# Patient Record
Sex: Male | Born: 1950
Health system: Southern US, Community
[De-identification: ages and names within clinical notes are randomized; demographics above are authoritative.]

## PROBLEM LIST (undated history)

## (undated) DIAGNOSIS — F319 Bipolar disorder, unspecified: Secondary | ICD-10-CM

## (undated) DIAGNOSIS — F411 Generalized anxiety disorder: Secondary | ICD-10-CM

## (undated) DIAGNOSIS — K219 Gastro-esophageal reflux disease without esophagitis: Secondary | ICD-10-CM

## (undated) DIAGNOSIS — M069 Rheumatoid arthritis, unspecified: Secondary | ICD-10-CM

## (undated) HISTORY — PX: HERNIA REPAIR: SHX51

## (undated) HISTORY — DX: Generalized anxiety disorder: F41.1

## (undated) HISTORY — PX: TOOTH EXTRACTION: SUR596

## (undated) HISTORY — DX: Bipolar disorder, unspecified: F31.9

## (undated) HISTORY — PX: CARPAL TUNNEL RELEASE: SHX101

## (undated) HISTORY — DX: Gastro-esophageal reflux disease without esophagitis: K21.9

## (undated) HISTORY — DX: Rheumatoid arthritis, unspecified: M06.9

## (undated) HISTORY — PX: OTHER SURGICAL HISTORY: SHX169

---

## 2001-08-30 ENCOUNTER — Encounter: Payer: Self-pay | Admitting: Emergency Medicine

## 2001-08-30 ENCOUNTER — Emergency Department (HOSPITAL_COMMUNITY): Admission: EM | Admit: 2001-08-30 | Discharge: 2001-08-30 | Payer: Self-pay | Admitting: Emergency Medicine

## 2003-09-29 ENCOUNTER — Emergency Department (HOSPITAL_COMMUNITY): Admission: EM | Admit: 2003-09-29 | Discharge: 2003-09-29 | Payer: Self-pay | Admitting: Emergency Medicine

## 2003-12-16 ENCOUNTER — Ambulatory Visit (HOSPITAL_COMMUNITY): Admission: RE | Admit: 2003-12-16 | Discharge: 2003-12-16 | Payer: Self-pay | Admitting: Gastroenterology

## 2005-02-02 ENCOUNTER — Encounter: Admission: RE | Admit: 2005-02-02 | Discharge: 2005-02-02 | Payer: Self-pay | Admitting: Internal Medicine

## 2005-04-16 ENCOUNTER — Ambulatory Visit: Payer: Self-pay | Admitting: Pulmonary Disease

## 2005-05-04 ENCOUNTER — Ambulatory Visit: Payer: Self-pay | Admitting: Internal Medicine

## 2005-05-10 ENCOUNTER — Ambulatory Visit: Payer: Self-pay | Admitting: Internal Medicine

## 2005-06-05 ENCOUNTER — Ambulatory Visit: Payer: Self-pay | Admitting: Internal Medicine

## 2005-10-24 ENCOUNTER — Ambulatory Visit: Payer: Self-pay | Admitting: Internal Medicine

## 2006-05-24 ENCOUNTER — Encounter: Admission: RE | Admit: 2006-05-24 | Discharge: 2006-05-24 | Payer: Self-pay | Admitting: Rheumatology

## 2006-11-20 ENCOUNTER — Ambulatory Visit: Payer: Self-pay | Admitting: Internal Medicine

## 2007-09-16 ENCOUNTER — Telehealth (INDEPENDENT_AMBULATORY_CARE_PROVIDER_SITE_OTHER): Payer: Self-pay | Admitting: *Deleted

## 2007-12-24 ENCOUNTER — Ambulatory Visit: Payer: Self-pay | Admitting: Internal Medicine

## 2007-12-24 DIAGNOSIS — J329 Chronic sinusitis, unspecified: Secondary | ICD-10-CM

## 2007-12-24 DIAGNOSIS — J3089 Other allergic rhinitis: Secondary | ICD-10-CM

## 2007-12-24 DIAGNOSIS — J330 Polyp of nasal cavity: Secondary | ICD-10-CM

## 2007-12-24 DIAGNOSIS — J45998 Other asthma: Secondary | ICD-10-CM

## 2007-12-24 DIAGNOSIS — J302 Other seasonal allergic rhinitis: Secondary | ICD-10-CM | POA: Insufficient documentation

## 2007-12-24 DIAGNOSIS — K219 Gastro-esophageal reflux disease without esophagitis: Secondary | ICD-10-CM | POA: Insufficient documentation

## 2007-12-24 HISTORY — DX: Gastro-esophageal reflux disease without esophagitis: K21.9

## 2007-12-24 HISTORY — DX: Chronic sinusitis, unspecified: J32.9

## 2007-12-24 HISTORY — DX: Other asthma: J45.998

## 2007-12-24 HISTORY — DX: Polyp of nasal cavity: J33.0

## 2007-12-24 HISTORY — DX: Other seasonal allergic rhinitis: J30.2

## 2009-08-18 ENCOUNTER — Ambulatory Visit (HOSPITAL_COMMUNITY): Admission: RE | Admit: 2009-08-18 | Discharge: 2009-08-18 | Payer: Self-pay | Admitting: Gastroenterology

## 2009-08-18 ENCOUNTER — Encounter (INDEPENDENT_AMBULATORY_CARE_PROVIDER_SITE_OTHER): Payer: Self-pay | Admitting: Gastroenterology

## 2010-03-13 ENCOUNTER — Telehealth: Payer: Self-pay | Admitting: Internal Medicine

## 2010-03-21 ENCOUNTER — Ambulatory Visit: Payer: Self-pay | Admitting: Internal Medicine

## 2010-04-11 ENCOUNTER — Telehealth (INDEPENDENT_AMBULATORY_CARE_PROVIDER_SITE_OTHER): Payer: Self-pay | Admitting: *Deleted

## 2010-04-11 ENCOUNTER — Ambulatory Visit: Payer: Self-pay | Admitting: Internal Medicine

## 2010-09-18 ENCOUNTER — Ambulatory Visit: Payer: Self-pay | Admitting: Internal Medicine

## 2010-09-18 DIAGNOSIS — M0609 Rheumatoid arthritis without rheumatoid factor, multiple sites: Secondary | ICD-10-CM

## 2010-09-18 HISTORY — DX: Rheumatoid arthritis without rheumatoid factor, multiple sites: M06.09

## 2010-10-29 ENCOUNTER — Encounter: Payer: Self-pay | Admitting: Internal Medicine

## 2010-11-07 NOTE — Assessment & Plan Note (Signed)
Summary: f/u ///kp   Primary Provider/Referring Provider:  Earl Gala  CC:  Follow up visit from ABX treatment.  History of Present Illness: 12/24/07- NASAL POLYP (ICD-471.0) G E R D (ICD-530.81) ASTHMA (ICD-493.90) ALLERGIC RHINITIS (ICD-477.9) Rheumatoid arthritis- methotrexate  Bronchitis and rhinosiusitis improved but didn't clear on augmentin before he caught another cold. Tells me he has been dx'd with rheumatoid arthritis by Dr.Shaili Deveshwar and is now no maintenance methotrexate.. We discussed how that will affect resistance and ability to clear infections Missed a day of work this week. Green sputum, no fever or chills. With methotrexate he no longer needs lufylline for his asthma.  March 21, 2010 Hx allergic rhinitis, asthma, hx recurrent sinusitis, Rheumatoid arthritis He responded very nicely to augmentin called in for a purulent sinusitis earlier this spring. Using rescue inhaler 2-7 days/ week. Triggers- hay being mowed. He wears dust mask and goggles to mow. Even steroid inhalers make him "mean". He can tolerate 1 puff rhinocort each nostril daily at bedtime. Singulair helps if needed. Likes Tussin otc.        Asthma History    Asthma Control Assessment:    Age range: 12+ years    Symptoms: 0-2 days/week    Nighttime Awakenings: 1-3/week    Interferes w/ normal activity: no limitations    SABA use (not for EIB): >2 days/week    Asthma Control Assessment: Not Well Controlled   Preventive Screening-Counseling & Management  Alcohol-Tobacco     Smoking Status: quit     Year Quit: 2009  Current Medications (verified): 1)  Methotrexate Injection .Marland Kitchen.. 1ml Once Weekly 2)  Rhinocort Aqua 32 Mcg/act  Susp (Budesonide) .... Take 1 Sprays in Each Nostril Once A Day 3)  Proventil Hfa 108 (90 Base) Mcg/act Aers (Albuterol Sulfate) .... 2 Puffs Four Times A Day As Needed 4)  Lithium .... Take 3 Tablets At Bedtime 5)  Augmentin 875-125 Mg  Tabs (Amoxicillin-Pot  Clavulanate) .Marland Kitchen.. 1 Two Times A Day 6)  Sm Tussin Cough & Cold 30-10-200 Mg Caps (Pseudoephedrine-Dm-Gg) .... Take As Directed As Needed 7)  Anti-Diarrheal 2 Mg Tabs (Loperamide Hcl) .... 3 By Mouth Once Daily  Allergies (verified): 1)  ! Steroids 2)  ! Aspirin 3)  Sulfa  Past History:  Past Medical History: Last updated: 12/24/2007 Allergic Rhinitis Asthma G E R D Rheumatoid arthritis  Past Surgical History: Last updated: 12/24/2007 Extensive sinus surgery with ablation of the frontal sinuses hernia repair nasal polypectomies  Social History: Last updated: 03/21/2010 Patient states former smoker.  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  The patient denies shortness of breath at rest, productive cough, non-productive cough, coughing up blood, chest pain, irregular heartbeats, acid heartburn, indigestion, loss of appetite, weight change, abdominal pain, difficulty swallowing, sore throat, tooth/dental problems, headaches, nasal congestion/difficulty breathing through nose, and sneezing.    Vital Signs:  Patient profile:   60 year old male Weight:      191 pounds O2 Sat:      96 % on Room air Pulse rate:   80 / minute BP sitting:   140 / 92  (right arm) Cuff size:  large  Vitals Entered By: Reynaldo Minium CMA (March 21, 2010 3:40 PM)  O2 Flow:  Room air CC: Follow up visit from ABX treatment   Physical Exam  Additional Exam:  General: A/Ox3; pleasant and cooperative, NAD, SKIN: no rash, lesions NODES: no lymphadenopathy HEENT: [place]/AT, EOM- WNL, Conjuctivae- clear, PERRLA, TM-WNL, Nose- clear, Throat- clear and wnl, Mallampati   II NECK: Supple w/ fair ROM, JVD- none, normal carotid impulses w/o bruits Thyroid- normal to palpation CHEST: Clear to P&A HEART: RRR, no m/g/r heard ABDOMEN: Soft and nl;  ZOX:WRUE, nl pulses, no edema  NEURO: Grossly intact to observation      Impression & Recommendations:  Problem # 1:  ASTHMA (ICD-493.90) Intermittent . I want him to have more access to a maintenance med - will encourage use of Singulair.  Problem # 2:  RHINOSINUSITIS, RECURRENT (ICD-473.9) Hx of surgery years ago with extensive obliteration of sinus cavities. He says that did help, but he still gets symptomatic.  Medications Added to Medication List This Visit: 1)  Proventil Hfa 108 (90 Base) Mcg/act Aers (Albuterol sulfate) .... 2 puffs four times a day as needed 2)  Sm Tussin Cough & Cold 30-10-200 Mg Caps (Pseudoephedrine-dm-gg) .... Take as directed as needed 3)  Anti-diarrheal 2 Mg Tabs (Loperamide hcl) .... 3 by mouth once daily 4)  Singulair 10 Mg Tabs (Montelukast sodium) .Marland Kitchen.. 1 daily  Other Orders: Est. Patient Level IV (45409)  Patient Instructions: 1)  Please schedule a follow-up appointment in 6 months. 2)  Script for  3)  singulair- quick to use and stay on this as a maintenance controller 4)  Schedule PFT Prescriptions: SINGULAIR 10 MG TABS (MONTELUKAST SODIUM) 1 daily  #30 x prn   Entered and Authorized by:   Waymon Budge MD   Signed by:   Waymon Budge MD on 03/21/2010   Method used:   Print then Give to Patient   RxID:   [id number]

## 2010-11-07 NOTE — Progress Notes (Signed)
Summary: rx request/ congestion  Phone Note Call from Patient   Caller: Patient Call For: Zenobia Kuennen Summary of Call: pt request a rx for augmentin. pt c/o head/ chest congestion x 1 1/2 weeks.  cough w/ yellow-green phlegm. denies fever. no N or V. has been using afrin. i offered an appt w/ tp but also scheduled a f/u w/ dr Jassica Zazueta for 6/14. pt doesn't want to come in before then. [facility].  pt # [id number] Initial call taken by: Tivis Ringer, CNA,  March 13, 2010 10:01 AM  Follow-up for Phone Call        Pt requesting augmentin for head congestion, prod cough with yellow-green mucus x 1 1/2 weeks.  Allergies:  ! STEROIDS ! ASPIRIN SULFA [facility].  Will forward message to CY-pls advise.  Thanks!  Follow-up by: Gweneth Dimitri RN,  March 13, 2010 10:27 AM  Additional Follow-up for Phone Call Additional follow up Details #1::        Ok augmentin 875 mg, # 14, ref x 1  1 two times a day  Additional Follow-up by: Waymon Budge MD,  March 13, 2010 1:35 PM    Additional Follow-up for Phone Call Additional follow up Details #2::    refill sent. pt aware.Carron Curie CMA  March 13, 2010 1:48 PM   New/Updated Medications: AUGMENTIN 875-125 MG  TABS (AMOXICILLIN-POT CLAVULANATE) 1 two times a day Prescriptions: AUGMENTIN 875-125 MG  TABS (AMOXICILLIN-POT CLAVULANATE) 1 two times a day  #14 x 1   Entered by:   Carron Curie CMA   Authorized by:   Waymon Budge MD   Signed by:   Carron Curie CMA on 03/13/2010   Method used:   Electronically to        [facility]* (retail)       [address] [place], [place]  [address]       Ph: [phone] or [phone]       Fax: [phone]   RxID:   [id number]

## 2010-11-07 NOTE — Miscellaneous (Signed)
Summary: Orders Update pft charges  Clinical Lists Changes  Orders: Added new Service order of Carbon Monoxide diffusing w/capacity (94720) - Signed Added new Service order of Lung Volumes (94240) - Signed Added new Service order of Spirometry (Pre & Post) (94060) - Signed 

## 2010-11-07 NOTE — Progress Notes (Signed)
Summary: Singulair correct dosage  Phone Note Call from Patient   Caller: Patient Call For: young Summary of Call: Pt rx for singulair states two times a day but he is given 30 tabs.  Is he taking this once daily or two times a day ? Initial call taken by: Jerolyn Shin,  April 11, 2010 5:13 PM  Follow-up for Phone Call        Should be once daily.  LMOMTCB Vernie Murders  April 11, 2010 5:18 PM  Pt aware Singulair is taken once daily and at bedtime. Pt had verbalized understanding of this.Michel Bickers CMA  April 13, 2010 10:02 AM

## 2010-11-09 NOTE — Assessment & Plan Note (Signed)
Summary: 6 months/apc   Primary Provider/Referring Provider:  Earl Gala  CC:  6 month follow up visit-asthma and allergies..  History of Present Illness: Bronchitis and rhinosiusitis improved but didn't clear on augmentin before he caught another cold. Tells me he has been dx'd with rheumatoid arthritis by Dr.Shaili Deveshwar and is now no maintenance methotrexate.. We discussed how that will affect resistance and ability to clear infections Missed a day of work this week. Green sputum, no fever or chills. With methotrexate he no longer needs lufylline for his asthma.  March 21, 2010 Hx allergic rhinitis, asthma, hx recurrent sinusitis, Rheumatoid arthritis He responded very nicely to augmentin called in for a purulent sinusitis earlier this spring. Using rescue inhaler 2-7 days/ week. Triggers- hay being mowed. He wears dust mask and goggles to mow. Even steroid inhalers make him "mean". He can tolerate 1 puff rhinocort each nostril daily at bedtime. Singulair helps if needed. Likes Tussin otc.  September 18, 2010- Hx allergic rhinitis, asthma, hx recurrent sinusitis, Rheumatoid arthritis Nurse-CC: 6 month follow up visit-asthma and allergies. Now on Humiria plus methotrexate for RA with good pain control. Stopped ETOH but still aware of some LFT elevation, followed by Dr Corliss Skains. Has not needed to see Dr Lazarus Salines recently but did get called in antibiotics a couple of times. Feels well from asthma standpoint. Singulair is a help and regular exercise also helps. Some cough and phlegm, but less. Using rescue inhaler twice daily.  PFT- 04/11/10- FVC 5.21/ 122%, FEV1 3.51/ 114%, R 0.67, + resp to BD, TLC 130%, DLCO 97%.      Asthma History    Initial Asthma Severity Rating:    Age range: 12+ years    Symptoms: 0-2 days/week    Nighttime Awakenings: 0-2/month    Interferes w/ normal activity: no limitations    SABA use (not for EIB): 0-2 days/week    Asthma Severity Assessment:  Intermittent   Preventive Screening-Counseling & Management  Alcohol-Tobacco     Smoking Status: quit     Year Quit: 2009  Current Medications (verified): 1)  Methotrexate Sodium 25 Mg/ml Soln (Methotrexate Sodium) .Marland Kitchen.. 1ml Once Weekly 2)  Rhinocort Aqua 32 Mcg/act  Susp (Budesonide) .... Take 1 Sprays in Each Nostril Once A Day 3)  Proventil Hfa 108 (90 Base) Mcg/act Aers (Albuterol Sulfate) .... 2 Puffs Four Times A Day As Needed 4)  Lithium Carbonate 300 Mg Caps (Lithium Carbonate) .... Take 3 By Mouth At Bedtime 5)  Sm Tussin Cough & Cold 30-10-200 Mg Caps (Pseudoephedrine-Dm-Gg) .... Take As Directed As Needed 6)  Anti-Diarrheal 2 Mg Tabs (Loperamide Hcl) .... 3 By Mouth Once Daily 7)  Singulair 10 Mg Tabs (Montelukast Sodium) .... Take 1 By Mouth Two Times A Day 8)  Viagra 100 Mg Tabs (Sildenafil Citrate) .... Use As Directed As Needed 9)  Folic Acid 1 Mg Tabs (Folic Acid) .... Take 2 By Mouth Once Daily 10)  Clonazepam 0.5 Mg Tabs (Clonazepam) .... Take 1 By Mouth Three Times A Day 11)  Nortriptyline Hcl 10 Mg Caps (Nortriptyline Hcl) .... Take 3 By Mouth At Bedtime 12)  Nasal Spray 0.05 % Soln (Oxymetazoline Hcl) .... As Needed 13)  Glucosamine-Chondroitin-Vit D3 1500-1200-800 Mg-Mg-Unit Pack (Glucosamine-Chondroitin-Vit D3) .... Take 2 By Mouth Once Daily 14)  Fish Oil 1200 Mg Caps (Omega-3 Fatty Acids) .... Take 1 By Mouth Once Daily  Allergies (verified): 1)  ! Steroids 2)  ! Aspirin 3)  Sulfa  Past History:  Past Medical History: Last  updated: 12/24/2007 Allergic Rhinitis Asthma G E R D Rheumatoid arthritis  Past Surgical History: Last updated: 12/24/2007 Extensive sinus surgery with ablation of the frontal sinuses hernia repair nasal polypectomies  Family History: Last updated: 03-25-10 Father- died MS Mother- died breast cancer  Social History: Last updated: 25-Mar-2010 Patient states former smoker.  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    Vital Signs:  Patient profile:   60 year old male Weight:      193.25 pounds O2 Sat:      96 % on Room air Pulse rate:   75 / minute BP sitting:   120 / 80  (left arm) Cuff size:   regular  Vitals Entered By: Reynaldo Minium CMA (September 18, 2010 4:00 PM)  O2 Flow:  Room air CC: 6 month follow up visit-asthma and allergies.   Physical Exam  Additional Exam:  General: A/Ox3; pleasant and cooperative, NAD, SKIN: no rash, lesions NODES: no lymphadenopathy HEENT: [place]/AT, EOM- WNL, Conjuctivae- clear, PERRLA, TM-WNL, Nose- clear, Throat- clear and wnl, Mallampati  II NECK: Supple w/ fair ROM, JVD- none, normal carotid impulses w/o bruits Thyroid- normal to palpation CHEST: Clear to P&A HEART: RRR, no m/g/r heard ABDOMEN: Soft and nl;  ZOX:WRUE, nl pulses, no edema  NEURO: Grossly intact to observation      Impression & Recommendations:  Problem # 1:  ASTHMA (ICD-493.90) Good control. Because of his methotrexate I will check his CXR for baseline.  PFTs measure large lungs. This could be error, but will accept for now as larger than average lungs for height.   Problem # 2:  ALLERGIC RHINITIS (ICD-477.9)  Good control with occasional sinusitis.  His updated medication list for this problem includes:    Rhinocort Aqua 32 Mcg/act Susp (Budesonide) .Marland Kitchen... Take 1 sprays in each nostril once a day    Nasal Spray 0.05 % Soln (Oxymetazoline hcl) .Marland Kitchen... As needed  Medications Added to Medication List  This Visit: 1)  Humira 40 Mg/0.51ml Kit (Adalimumab) 2)  Methotrexate Sodium 25 Mg/ml Soln (Methotrexate sodium) .Marland Kitchen.. 1ml once weekly 3)  Lithium Carbonate 300 Mg Caps (Lithium carbonate) .... Take 3 by mouth at bedtime 4)  Singulair 10 Mg Tabs (Montelukast sodium) .... Take 1 by mouth two times a day 5)  Viagra 100 Mg Tabs (Sildenafil citrate) .... Use as directed as needed 6)  Folic Acid 1 Mg Tabs (Folic acid) .... Take 2 by mouth once daily 7)  Clonazepam 0.5 Mg Tabs (Clonazepam) .... Take 1 by mouth three times a day 8)  Nortriptyline Hcl 10 Mg Caps (Nortriptyline hcl) .... Take 3 by mouth at bedtime 9)  Nasal Spray 0.05 % Soln (Oxymetazoline hcl) .... As needed 10)  Glucosamine-chondroitin-vit D3 1500-1200-800 Mg-mg-unit Pack (Glucosamine-chondroitin-vit d3) .... Take 2 by mouth once daily 11)  Fish Oil 1200 Mg Caps (Omega-3 fatty acids) .... Take 1 by mouth once daily  Other Orders: Est. Patient Level III (45409) T-2 View CXR (71020TC)  Patient Instructions: 1)  Please schedule a follow-up appointment in 1 year. 2)  A chest x-ray has been recommended.  Your imaging study may require preauthorization.

## 2011-02-23 NOTE — Op Note (Signed)
NAME:  [name] [name], [name] [name]                        ACCOUNT NO.:  [id number]   MEDICAL RECORD NO.:  [id number]                   PATIENT TYPE:  AMB   LOCATION:  ENDO                                 FACILITY:  [facility]   PHYSICIAN:  Danise Edge, M.D.                DATE OF BIRTH:  1950/12/28   DATE OF PROCEDURE:  12/16/2003  DATE OF DISCHARGE:                                 OPERATIVE REPORT   PROCEDURE:  Screening colonoscopy.   INDICATIONS:  [name] is a 60 year old male, born August 16, 1951.  [name] is scheduled to undergo his first screening colonoscopy  with polypectomy to prevent colon cancer.   ENDOSCOPIST:  Danise Edge, M.D.   PREMEDICATION:  Versed 10 mg, Demerol 70 mg.   DESCRIPTION OF PROCEDURE:  After obtaining informed consent, [name] was  placed in the left lateral decubitus position.  I administered intravenous  Demerol and intravenous Versed to achieve conscious sedation for the  procedure.  The patient's blood pressure, oxygen saturation and cardiac  rhythm were monitored throughout the procedure and documented in the medical  record.   Anal inspection was normal.  Digital rectal exam revealed a small nonnodular  prostate.  The Olympus adjustable pediatric colonoscope was introduced into  the rectum and advanced to the cecum.  Colonic preparation for the exam  today was excellent.  Rectum:  Normal.  Sigmoid colon and descending colon:  Normal.  Splenic flexure:  Normal.  Transverse colon:  Normal.  Hepatic flexure:  Normal.  Ascending colon:  Normal.  Cecum and ileocecal valve:  Normal.   ASSESSMENT:  Normal proctocolonoscopy to the cecum.  No endoscopic evidence  for the presence of colorectal neoplasia.                                               Danise Edge, M.D.    MJ/MEDQ  D:  12/16/2003  T:  12/16/2003  Job:  [id number]   cc:   Theressa Millard, M.D.  [address] [place]  [place] [address]  Fax: [phone]

## 2011-02-23 NOTE — Assessment & Plan Note (Signed)
[name]                             PULMONARY OFFICE NOTE   [name], [name] [name]                     MRN:          [id number]  DATE:11/20/2006                            DOB:          [name] [name] 02, 1952    HISTORY OF PRESENT ILLNESS:  Patient is a 60 year old white male,  patient of Dr. Maple Hudson, who has a known history of allergic rhinitis,  asthma, nasal polyps/asthma triad who presents for an acute office  visit.  Patient complains of a 2-day history of nasal congestion,  productive cough with thick whitish/brown sputum.  Patient denies any  hemoptysis, orthopnea, PND or leg swelling.   PAST MEDICAL HISTORY:  Reviewed.   CURRENT MEDICATIONS:  Reviewed.   PHYSICAL EXAMINATION:  Patient is a pleasant male in no acute distress.  He is afebrile with stable vital signs.  His 02 saturation is 96% on  room air.  HEENT:  Nasal mucosa with mild erythema.  Non-tender sinuses.  NECK:  Supple without adenopathy.  LUNG SOUNDS:  Clear to auscultation bilaterally.  CARDIAC:  Regular rate.  ABDOMEN:  Soft and non-tender.  EXTREMITIES:  Warm without any edema.   IMPRESSION AND PLAN:  Acute upper respiratory infection.  Suspect this  is viral in nature.  Patient is to begin Mucinex DM twice daily.  [name] [name]  use Tussionex 4 ounces, 1 teaspoon every 12 hours as needed for cough.  Xopenex nebulizer treatment was given in the office.  Patient is to  return back with Dr. Maple Hudson as scheduled, or sooner if needed.      Rubye Oaks, NP  Electronically Signed      Clinton D. Maple Hudson, MD, Tonny Bollman, FACP  Electronically Signed   TP/MedQ  DD: 11/20/2006  DT: 11/20/2006  Job #: [id number]

## 2011-08-13 ENCOUNTER — Other Ambulatory Visit: Payer: Self-pay | Admitting: Internal Medicine

## 2011-08-14 ENCOUNTER — Telehealth: Payer: Self-pay | Admitting: Internal Medicine

## 2011-08-14 NOTE — Telephone Encounter (Signed)
I spoke with pt and he states he went to urgent care on Friday bc he was having an asthma attack and felt he had a cold in his chest and head x Friday. Pt states they gave him clarithromycin 500 mg 1 BID, promethazine cough syrup 5 cc 4 times a day prn, fexofenadine 180 daily. Pt states he wants to make sure Dr. Maple Hudson thinks he is on the right medication. Pt states he is feeling some better. Pt is still experiencing cough w/ yellow phlem and streaks of blodd in it, wheezing, chest tightness, nasal drainage. Please advise Dr. Maple Hudson, thanks  Allergies  Allergen Reactions  . Aspirin     REACTION: throat swelling  . Sulfonamide Derivatives     REACTION: oral sores     Carver Fila, CMA

## 2011-08-14 NOTE — Telephone Encounter (Signed)
These are very reasonable meds and will need time to work. If he doesn't get better, we can call in something else.

## 2011-08-14 NOTE — Telephone Encounter (Signed)
I spoke with pt and made him aware of cdy recs. Pt verbalized understanding and states he will call back if no better by friday

## 2011-09-17 ENCOUNTER — Encounter: Payer: Self-pay | Admitting: Internal Medicine

## 2011-09-18 ENCOUNTER — Ambulatory Visit (INDEPENDENT_AMBULATORY_CARE_PROVIDER_SITE_OTHER): Payer: 59 | Admitting: Internal Medicine

## 2011-09-18 ENCOUNTER — Encounter: Payer: Self-pay | Admitting: Internal Medicine

## 2011-09-18 VITALS — BP 140/82 | HR 63 | Ht 68.0 in | Wt 196.6 lb

## 2011-09-18 DIAGNOSIS — J45909 Unspecified asthma, uncomplicated: Secondary | ICD-10-CM

## 2011-09-18 DIAGNOSIS — J329 Chronic sinusitis, unspecified: Secondary | ICD-10-CM

## 2011-09-18 MED ORDER — BECLOMETHASONE DIPROPIONATE 40 MCG/ACT IN AERS: 2.0000 | INHALATION_SPRAY | Freq: Two times a day (BID) | RESPIRATORY_TRACT | Status: DC

## 2011-09-18 NOTE — Patient Instructions (Signed)
Sample and script Qvar 40 steroid maintenance inhaler   2 puffs then rinse mouth, twice daily

## 2011-09-18 NOTE — Progress Notes (Signed)
09/18/11- 60 yoM former smoker followed for asthma, allergic rhinitis, hx rhinosinusitis, nasal polyps complicated by rheumatoid arthritis. LOV-09/18/2010 Has had flu vaccine. Going through marital separation. Had  caught a cold with asthma exacerbation treated at urgent care with Biaxin and cough syrup. Symptoms resolving after steroid injection. He has been needing his rescue inhaler twice daily. On Humira for RA/ Dr Corliss Skains.  ROS-see HpI Constitutional:   No-   weight loss, night sweats, fevers, chills, fatigue, lassitude. HEENT:   No-  headaches, difficulty swallowing, tooth/dental problems, sore throat,       No-  sneezing, itching, ear ache, +nasal congestion, post nasal drip,  CV:  No-   chest pain, orthopnea, PND, swelling in lower extremities, anasarca,                                  dizziness, palpitations Resp: No-   shortness of breath with exertion or at rest.              No-   productive cough,  + non-productive cough,  No- coughing up of blood.              No-   change in color of mucus.  + wheezing.   Skin: No-   rash or lesions. GI:  No-   heartburn, indigestion, abdominal pain, nausea, vomiting, diarrhea,                 change in bowel habits, loss of appetite GU: No-   dysuria, change in color of urine, no urgency or frequency.  No- flank pain. MS: +  joint pain or swelling.  No- decreased range of motion.  No- back pain. Neuro-     nothing unusual Psych:  No- change in mood or affect. No depression or anxiety.  No memory loss.  OBJ General- Alert, Oriented, Affect-appropriate, Distress- none acute Skin- rash-none, lesions- none, excoriation- none Lymphadenopathy- none Head- atraumatic            Eyes- Gross vision intact, PERRLA, conjunctivae clear secretions            Ears- Hearing, canals-normal            Nose- Clear, no-Septal dev, mucus, polyps, erosion, perforation             Throat- Mallampati II , mucosa clear , drainage- none, tonsils-  atrophic Neck- flexible , trachea midline, no stridor , thyroid nl, carotid no bruit Chest - symmetrical excursion , unlabored           Heart/CV- RRR , no murmur , no gallop  , no rub, nl s1 s2                           - JVD- none , edema- none, stasis changes- none, varices- none           Lung- clear to P&A, wheeze- none, raspy cough , dullness-none, rub- none           Chest wall-  Abd- tender-no, distended-no, bowel sounds-present, HSM- no Br/ Gen/ Rectal- Not done, not indicated Extrem- cyanosis- none, clubbing, none, atrophy- none, strength- nl. Synovial thickening of hand joints. Neuro- grossly intact to observation

## 2011-09-22 NOTE — Assessment & Plan Note (Signed)
Recent viral URI does not seem to be headed to a sinus infection at this time. He was treated at an urgent care with clarithromycin.

## 2011-09-22 NOTE — Assessment & Plan Note (Signed)
Mild intermittent asthma with recent viral triggered exacerbation which he is controlling fairly well. I would like to add a maintenance steroid inhaler as discussed.

## 2011-10-16 ENCOUNTER — Other Ambulatory Visit: Payer: Self-pay | Admitting: Internal Medicine

## 2011-10-16 MED ORDER — MONTELUKAST SODIUM 10 MG PO TABS: 10.0000 mg | ORAL_TABLET | Freq: Two times a day (BID) | ORAL | Status: DC

## 2011-10-16 NOTE — Telephone Encounter (Signed)
RX sent

## 2011-10-16 NOTE — Telephone Encounter (Signed)
Singulair prescription written for twice daily dosing. CDY please advise if these directions are correct. Last filled on 08/31/11.

## 2011-10-16 NOTE — Telephone Encounter (Signed)
Per CY-okay to refill as requested. 

## 2011-10-18 ENCOUNTER — Telehealth: Payer: Self-pay | Admitting: Internal Medicine

## 2011-10-18 NOTE — Telephone Encounter (Signed)
I spoke with pt and made him aware rx was sent on 10/16/11. He voiced his understanding and will check with his pharmacy. Pt needed nothing further

## 2011-10-23 ENCOUNTER — Other Ambulatory Visit: Payer: Self-pay | Admitting: Internal Medicine

## 2011-11-06 ENCOUNTER — Other Ambulatory Visit: Payer: Self-pay | Admitting: Endocrinology

## 2011-11-06 DIAGNOSIS — E291 Testicular hypofunction: Secondary | ICD-10-CM

## 2011-11-11 ENCOUNTER — Ambulatory Visit
Admission: RE | Admit: 2011-11-11 | Discharge: 2011-11-11 | Disposition: A | Discharge status: Home / Self Care | Payer: 59 | Source: Ambulatory Visit | Attending: Endocrinology | Admitting: Endocrinology

## 2011-11-11 DIAGNOSIS — E291 Testicular hypofunction: Secondary | ICD-10-CM

## 2011-11-11 MED ORDER — GADOBENATE DIMEGLUMINE 529 MG/ML IV SOLN
9.0000 mL | Freq: Once | INTRAVENOUS | Status: AC | PRN
  Administered 2011-11-11: 9 mL via INTRAVENOUS

## 2012-04-15 DIAGNOSIS — E236 Other disorders of pituitary gland: Secondary | ICD-10-CM | POA: Diagnosis not present

## 2012-04-22 DIAGNOSIS — Z79899 Other long term (current) drug therapy: Secondary | ICD-10-CM | POA: Diagnosis not present

## 2012-04-22 DIAGNOSIS — Z111 Encounter for screening for respiratory tuberculosis: Secondary | ICD-10-CM | POA: Diagnosis not present

## 2012-04-22 DIAGNOSIS — F3162 Bipolar disorder, current episode mixed, moderate: Secondary | ICD-10-CM | POA: Diagnosis not present

## 2012-05-12 ENCOUNTER — Telehealth: Payer: Self-pay | Admitting: Internal Medicine

## 2012-05-12 MED ORDER — AMOXICILLIN-POT CLAVULANATE 875-125 MG PO TABS: 1.0000 | ORAL_TABLET | Freq: Two times a day (BID) | ORAL | Status: AC

## 2012-05-12 NOTE — Telephone Encounter (Signed)
Per CY-okay to give Augmentin 875 mg #14 take 1 po bid with 1 additional refill.

## 2012-05-12 NOTE — Telephone Encounter (Signed)
I spoke with pt and is aware of CDY recs. Rx has been sent to the pharmacy 

## 2012-05-12 NOTE — Telephone Encounter (Signed)
I spoke with pt and he c/o chest congestion, cough w/ yellow-green phlem, blowing out yellow-green phlem, increase sob, pnd X 1 1/2 WEEKS. Denies any f/c/s/n/v. Pt is requesting an ABX be called in. Please advise Dr. Maple Hudson thanks  Allergies  Allergen Reactions  . Aspirin     REACTION: throat swelling  . Sulfonamide Derivatives     REACTION: oral sores     [facility] [place]

## 2012-05-16 ENCOUNTER — Other Ambulatory Visit: Payer: Self-pay | Admitting: Internal Medicine

## 2012-05-29 DIAGNOSIS — R6882 Decreased libido: Secondary | ICD-10-CM | POA: Diagnosis not present

## 2012-05-29 DIAGNOSIS — E236 Other disorders of pituitary gland: Secondary | ICD-10-CM | POA: Diagnosis not present

## 2012-05-29 DIAGNOSIS — R5381 Other malaise: Secondary | ICD-10-CM | POA: Diagnosis not present

## 2012-05-29 DIAGNOSIS — N529 Male erectile dysfunction, unspecified: Secondary | ICD-10-CM | POA: Diagnosis not present

## 2012-07-01 DIAGNOSIS — M25549 Pain in joints of unspecified hand: Secondary | ICD-10-CM | POA: Diagnosis not present

## 2012-07-07 DIAGNOSIS — Z79899 Other long term (current) drug therapy: Secondary | ICD-10-CM | POA: Diagnosis not present

## 2012-08-05 DIAGNOSIS — L708 Other acne: Secondary | ICD-10-CM | POA: Diagnosis not present

## 2012-08-12 ENCOUNTER — Other Ambulatory Visit: Payer: Self-pay | Admitting: Internal Medicine

## 2012-08-21 DIAGNOSIS — M25579 Pain in unspecified ankle and joints of unspecified foot: Secondary | ICD-10-CM | POA: Diagnosis not present

## 2012-08-21 DIAGNOSIS — Z79899 Other long term (current) drug therapy: Secondary | ICD-10-CM | POA: Diagnosis not present

## 2012-08-21 DIAGNOSIS — M25549 Pain in joints of unspecified hand: Secondary | ICD-10-CM | POA: Diagnosis not present

## 2012-08-21 DIAGNOSIS — M069 Rheumatoid arthritis, unspecified: Secondary | ICD-10-CM | POA: Diagnosis not present

## 2012-09-22 DIAGNOSIS — E236 Other disorders of pituitary gland: Secondary | ICD-10-CM | POA: Diagnosis not present

## 2012-09-24 DIAGNOSIS — N529 Male erectile dysfunction, unspecified: Secondary | ICD-10-CM | POA: Diagnosis not present

## 2012-09-24 DIAGNOSIS — R5381 Other malaise: Secondary | ICD-10-CM | POA: Diagnosis not present

## 2012-09-24 DIAGNOSIS — R5383 Other fatigue: Secondary | ICD-10-CM | POA: Diagnosis not present

## 2012-09-24 DIAGNOSIS — E236 Other disorders of pituitary gland: Secondary | ICD-10-CM | POA: Diagnosis not present

## 2012-09-24 DIAGNOSIS — R6882 Decreased libido: Secondary | ICD-10-CM | POA: Diagnosis not present

## 2012-10-03 ENCOUNTER — Other Ambulatory Visit: Payer: Self-pay | Admitting: Internal Medicine

## 2012-10-03 MED ORDER — ALBUTEROL SULFATE HFA 108 (90 BASE) MCG/ACT IN AERS: INHALATION_SPRAY | RESPIRATORY_TRACT | Status: DC

## 2012-10-03 NOTE — Telephone Encounter (Signed)
Last fill 07-10-12

## 2012-10-09 ENCOUNTER — Telehealth: Payer: Self-pay | Admitting: Internal Medicine

## 2012-10-09 MED ORDER — BECLOMETHASONE DIPROPIONATE 40 MCG/ACT IN AERS: 2.0000 | INHALATION_SPRAY | Freq: Two times a day (BID) | RESPIRATORY_TRACT | Status: DC

## 2012-10-09 NOTE — Telephone Encounter (Signed)
Pt last seen 09-2011 so I advised we will need to schedule an appt before refill can be sent. Appt set for 10-21-12 and refill sent.Carron Curie, CMA

## 2012-10-21 ENCOUNTER — Encounter: Payer: Self-pay | Admitting: Internal Medicine

## 2012-10-21 ENCOUNTER — Ambulatory Visit (INDEPENDENT_AMBULATORY_CARE_PROVIDER_SITE_OTHER)
Admission: RE | Admit: 2012-10-21 | Discharge: 2012-10-21 | Disposition: A | Discharge status: Home / Self Care | Payer: Medicare Other | Source: Ambulatory Visit | Attending: Internal Medicine | Admitting: Internal Medicine

## 2012-10-21 ENCOUNTER — Ambulatory Visit (INDEPENDENT_AMBULATORY_CARE_PROVIDER_SITE_OTHER): Payer: Medicare Other | Admitting: Internal Medicine

## 2012-10-21 VITALS — BP 132/66 | HR 69 | Ht 68.0 in | Wt 193.2 lb

## 2012-10-21 DIAGNOSIS — J45909 Unspecified asthma, uncomplicated: Secondary | ICD-10-CM | POA: Diagnosis not present

## 2012-10-21 DIAGNOSIS — Z87891 Personal history of nicotine dependence: Secondary | ICD-10-CM | POA: Diagnosis not present

## 2012-10-21 DIAGNOSIS — M069 Rheumatoid arthritis, unspecified: Secondary | ICD-10-CM

## 2012-10-21 DIAGNOSIS — J330 Polyp of nasal cavity: Secondary | ICD-10-CM

## 2012-10-21 DIAGNOSIS — J45998 Other asthma: Secondary | ICD-10-CM

## 2012-10-21 MED ORDER — ALBUTEROL SULFATE HFA 108 (90 BASE) MCG/ACT IN AERS: INHALATION_SPRAY | RESPIRATORY_TRACT | Status: DC

## 2012-10-21 MED ORDER — BECLOMETHASONE DIPROPIONATE 40 MCG/ACT IN AERS: 2.0000 | INHALATION_SPRAY | Freq: Two times a day (BID) | RESPIRATORY_TRACT | Status: DC

## 2012-10-21 NOTE — Patient Instructions (Addendum)
Order- Order- CXR   Dx asthma, former smoker  Scripts sent for Qvar and for albuterol rescue inhaler

## 2012-10-21 NOTE — Progress Notes (Signed)
09/18/11- 60 yoM former smoker followed for asthma, allergic rhinitis, hx rhinosinusitis, nasal polyps complicated by rheumatoid arthritis. LOV-09/18/2010 Has had flu vaccine. Going through marital separation. Had  caught a cold with asthma exacerbation treated at urgent care with Biaxin and cough syrup. Symptoms resolving after steroid injection. He has been needing his rescue inhaler twice daily. On Humira for RA/ Dr Corliss Skains.  1[birth year]14-61 yoM former smoker followed for asthma, allergic rhinitis, hx rhinosinusitis, nasal polyps complicated by rheumatoid arthritis. FOLLOWS FOR: was doing good until he ran out of Qvar(had to make appt for refill) uses rescue inhaler as well.  Ran out of both inhalers 2 weeks ago and began coughing more. Otherwise had been well controlled. On Humira and MTX for rheumatoid arthritis. CXR 09/18/10 IMPRESSION:  No active lung disease.  Provider: Darrelyn Hillock, [place]   ROS-see HpI Constitutional:   No-   weight loss, night sweats, fevers, chills, fatigue, lassitude. HEENT:   No-  headaches, difficulty swallowing, tooth/dental problems, sore throat,       No-  sneezing, itching, ear ache, nasal congestion, post nasal drip,  CV:  No-   chest pain, orthopnea, PND, swelling in lower extremities, anasarca,                                  dizziness, palpitations Resp: No-   shortness of breath with exertion or at rest.              No-   productive cough,  + non-productive cough,  No- coughing up of blood.              No-   change in color of mucus.  + Some wheezing.   Skin: No-   rash or lesions. GI:  No-   heartburn, indigestion, abdominal pain, nausea, vomiting, GU:  MS: +  joint pain or swelling.   Neuro-     nothing unusual Psych:  No- change in mood or affect. No depression or anxiety.  No memory loss.  OBJ General- Alert, Oriented, Affect-appropriate, Distress- none acute Skin- rash-none, lesions- none, excoriation- none Lymphadenopathy-  none Head- atraumatic            Eyes- Gross vision intact, PERRLA, conjunctivae clear secretions            Ears- Hearing, canals-normal            Nose- Clear, no-Septal dev, mucus, polyps, erosion, perforation             Throat- Mallampati II , mucosa clear , drainage- none, tonsils- atrophic Neck- flexible , trachea midline, no stridor , thyroid nl, carotid no bruit Chest - symmetrical excursion , unlabored           Heart/CV- RRR , no murmur , no gallop  , no rub, nl s1 s2                           - JVD- none , edema- none, stasis changes- none, varices- none           Lung- clear to P&A, wheeze- none, no- cough , dullness-none, rub- none           Chest wall-  Abd-  Br/ Gen/ Rectal- Not done, not indicated Extrem- cyanosis- none, clubbing, none, atrophy- none, strength- nl. +Synovial thickening of hand joints. Neuro- grossly intact to observation

## 2012-10-22 ENCOUNTER — Telehealth: Payer: Self-pay | Admitting: Internal Medicine

## 2012-10-22 NOTE — Progress Notes (Signed)
Quick Note:  LMTCB ______ 

## 2012-10-22 NOTE — Telephone Encounter (Signed)
Result Note     CXR- clear lungs, no active process   --- I spoke with patient about results and he verbalized understanding and had no questions.

## 2012-10-28 ENCOUNTER — Other Ambulatory Visit: Payer: Self-pay | Admitting: Internal Medicine

## 2012-10-28 MED ORDER — MONTELUKAST SODIUM 10 MG PO TABS: ORAL_TABLET | ORAL | Status: DC

## 2012-11-01 NOTE — Assessment & Plan Note (Signed)
Generally well controlled with increased symptoms only because he has run out of maintenance medications. He had been a smoker. Plan-refill meds, update chest x-ray

## 2012-11-01 NOTE — Assessment & Plan Note (Signed)
No polyps seen on exam today. His rheumatoid medications may suppress them.

## 2012-11-01 NOTE — Assessment & Plan Note (Signed)
On immunosuppressive therapy

## 2012-11-05 ENCOUNTER — Telehealth: Payer: Self-pay | Admitting: Internal Medicine

## 2012-11-05 MED ORDER — AMOXICILLIN 500 MG PO CAPS: 500.0000 mg | ORAL_CAPSULE | Freq: Three times a day (TID) | ORAL | Status: DC

## 2012-11-05 NOTE — Telephone Encounter (Signed)
Forwarding to CY.

## 2012-11-05 NOTE — Telephone Encounter (Signed)
Spoke with patient-states for 3-4 days now he is having raspy voice, SOB, and cough-productive-green in color; he is using inhalers but request Amoxicillin Rx called to pharmacy as this helps when he gets like this. Pt aware that CY out of the office this afternoon and will forward to [facility] as doc of day. Please advise.

## 2012-11-05 NOTE — Telephone Encounter (Signed)
Ok to Rx amoxacillin 500 mg, # 21, 1 tid  No ref

## 2012-11-05 NOTE — Telephone Encounter (Signed)
Pt aware of CDY recs. He voiced his understanding and needed nothing further was needed

## 2012-11-11 ENCOUNTER — Telehealth: Payer: Self-pay | Admitting: Internal Medicine

## 2012-11-11 MED ORDER — AMOXICILLIN 500 MG PO CAPS: 500.0000 mg | ORAL_CAPSULE | Freq: Three times a day (TID) | ORAL | Status: DC

## 2012-11-11 NOTE — Telephone Encounter (Signed)
Ok to refill the amoxacillin

## 2012-11-11 NOTE — Telephone Encounter (Signed)
I spoke with pt. C/o cough w/ green to brown phlem, HA, raspy voice is getting better, SOB is better. No wheezing, no chest tx. He has 2 doses of amoxicillin left. He is requesting refill on this bc he does not believe he is cleared up. Please advise Dr. Maple Hudson thanks Last OV 10/21/12 No pending APPT  Allergies  Allergen Reactions  . Aspirin     REACTION: throat swelling  . Sulfonamide Derivatives     REACTION: oral sores

## 2012-11-11 NOTE — Telephone Encounter (Signed)
Called spoke with patient Advised of CY's recs to refill the amoxicillin Refill done as last prescribed Pt is aware Rx sent to verified pharmacy Pt aware to call back if symptoms do not improve or worsen Will sign off

## 2012-12-04 DIAGNOSIS — Z79899 Other long term (current) drug therapy: Secondary | ICD-10-CM | POA: Diagnosis not present

## 2013-01-13 DIAGNOSIS — R5383 Other fatigue: Secondary | ICD-10-CM | POA: Diagnosis not present

## 2013-01-13 DIAGNOSIS — M069 Rheumatoid arthritis, unspecified: Secondary | ICD-10-CM | POA: Diagnosis not present

## 2013-01-13 DIAGNOSIS — T451X5A Adverse effect of antineoplastic and immunosuppressive drugs, initial encounter: Secondary | ICD-10-CM | POA: Diagnosis not present

## 2013-01-13 DIAGNOSIS — Z79899 Other long term (current) drug therapy: Secondary | ICD-10-CM | POA: Diagnosis not present

## 2013-01-13 DIAGNOSIS — M19049 Primary osteoarthritis, unspecified hand: Secondary | ICD-10-CM | POA: Diagnosis not present

## 2013-01-13 DIAGNOSIS — R5381 Other malaise: Secondary | ICD-10-CM | POA: Diagnosis not present

## 2013-01-26 DIAGNOSIS — E236 Other disorders of pituitary gland: Secondary | ICD-10-CM | POA: Diagnosis not present

## 2013-01-26 DIAGNOSIS — R5381 Other malaise: Secondary | ICD-10-CM | POA: Diagnosis not present

## 2013-02-02 DIAGNOSIS — R5381 Other malaise: Secondary | ICD-10-CM | POA: Diagnosis not present

## 2013-02-02 DIAGNOSIS — E236 Other disorders of pituitary gland: Secondary | ICD-10-CM | POA: Diagnosis not present

## 2013-02-02 DIAGNOSIS — R6882 Decreased libido: Secondary | ICD-10-CM | POA: Diagnosis not present

## 2013-02-02 DIAGNOSIS — N529 Male erectile dysfunction, unspecified: Secondary | ICD-10-CM | POA: Diagnosis not present

## 2013-02-03 DIAGNOSIS — M069 Rheumatoid arthritis, unspecified: Secondary | ICD-10-CM | POA: Diagnosis not present

## 2013-02-24 DIAGNOSIS — Z79899 Other long term (current) drug therapy: Secondary | ICD-10-CM | POA: Diagnosis not present

## 2013-02-24 DIAGNOSIS — M2550 Pain in unspecified joint: Secondary | ICD-10-CM | POA: Diagnosis not present

## 2013-03-04 DIAGNOSIS — Z79899 Other long term (current) drug therapy: Secondary | ICD-10-CM | POA: Diagnosis not present

## 2013-03-04 DIAGNOSIS — M069 Rheumatoid arthritis, unspecified: Secondary | ICD-10-CM | POA: Diagnosis not present

## 2013-03-04 DIAGNOSIS — Z111 Encounter for screening for respiratory tuberculosis: Secondary | ICD-10-CM | POA: Diagnosis not present

## 2013-04-04 ENCOUNTER — Other Ambulatory Visit: Payer: Self-pay | Admitting: Internal Medicine

## 2013-04-30 DIAGNOSIS — Z79899 Other long term (current) drug therapy: Secondary | ICD-10-CM | POA: Diagnosis not present

## 2013-05-01 DIAGNOSIS — M069 Rheumatoid arthritis, unspecified: Secondary | ICD-10-CM | POA: Diagnosis not present

## 2013-05-01 DIAGNOSIS — Z79899 Other long term (current) drug therapy: Secondary | ICD-10-CM | POA: Diagnosis not present

## 2013-05-05 ENCOUNTER — Other Ambulatory Visit: Payer: Self-pay | Admitting: Internal Medicine

## 2013-05-09 ENCOUNTER — Telehealth: Payer: Self-pay | Admitting: Internal Medicine

## 2013-05-09 MED ORDER — AMOXICILLIN 500 MG PO CAPS: 500.0000 mg | ORAL_CAPSULE | Freq: Three times a day (TID) | ORAL | Status: DC

## 2013-05-09 NOTE — Telephone Encounter (Signed)
Having green mucus and increase need for albuterol and wants amox > refilled rx and asked him to call office if not better by 8/4 and to er in meantime if worsens

## 2013-06-03 DIAGNOSIS — E236 Other disorders of pituitary gland: Secondary | ICD-10-CM | POA: Diagnosis not present

## 2013-06-05 DIAGNOSIS — E236 Other disorders of pituitary gland: Secondary | ICD-10-CM | POA: Diagnosis not present

## 2013-06-05 DIAGNOSIS — R5381 Other malaise: Secondary | ICD-10-CM | POA: Diagnosis not present

## 2013-06-05 DIAGNOSIS — D539 Nutritional anemia, unspecified: Secondary | ICD-10-CM | POA: Diagnosis not present

## 2013-06-05 DIAGNOSIS — R6882 Decreased libido: Secondary | ICD-10-CM | POA: Diagnosis not present

## 2013-06-05 DIAGNOSIS — N529 Male erectile dysfunction, unspecified: Secondary | ICD-10-CM | POA: Diagnosis not present

## 2013-06-06 ENCOUNTER — Other Ambulatory Visit: Payer: Self-pay | Admitting: Internal Medicine

## 2013-06-17 DIAGNOSIS — Z79899 Other long term (current) drug therapy: Secondary | ICD-10-CM | POA: Diagnosis not present

## 2013-06-23 DIAGNOSIS — M069 Rheumatoid arthritis, unspecified: Secondary | ICD-10-CM | POA: Diagnosis not present

## 2013-06-23 DIAGNOSIS — M6530 Trigger finger, unspecified finger: Secondary | ICD-10-CM | POA: Diagnosis not present

## 2013-06-23 DIAGNOSIS — M76899 Other specified enthesopathies of unspecified lower limb, excluding foot: Secondary | ICD-10-CM | POA: Diagnosis not present

## 2013-06-23 DIAGNOSIS — Z09 Encounter for follow-up examination after completed treatment for conditions other than malignant neoplasm: Secondary | ICD-10-CM | POA: Diagnosis not present

## 2013-06-26 DIAGNOSIS — Z79899 Other long term (current) drug therapy: Secondary | ICD-10-CM | POA: Diagnosis not present

## 2013-06-26 DIAGNOSIS — M069 Rheumatoid arthritis, unspecified: Secondary | ICD-10-CM | POA: Diagnosis not present

## 2013-07-11 ENCOUNTER — Other Ambulatory Visit: Payer: Self-pay | Admitting: Internal Medicine

## 2013-07-14 ENCOUNTER — Telehealth: Payer: Self-pay | Admitting: Internal Medicine

## 2013-07-14 MED ORDER — AMOXICILLIN-POT CLAVULANATE 875-125 MG PO TABS: 1.0000 | ORAL_TABLET | Freq: Two times a day (BID) | ORAL | Status: DC

## 2013-07-14 NOTE — Telephone Encounter (Signed)
I spoke with pt. He c/o cough w/ yellow-green phlem, wheezing, chest tx, white patches in back of throat that looks like it has puss in it-painful and hurts to swallow, blowing out yellow-green phlem x 4 days. Pt requesting recs. Please advise Dr. Maple Hudson thanks Last OV 10/11/12 Pending none Allergies  Allergen Reactions  . Aspirin     REACTION: throat swelling  . Sulfonamide Derivatives     REACTION: oral sores

## 2013-07-14 NOTE — Telephone Encounter (Signed)
Called, spoke with pt.  Informed him of below recs.  He verbalized understanding of this and is to call back if symptoms do not improve or worsen.

## 2013-07-14 NOTE — Telephone Encounter (Signed)
Per CY-give Augmentin 875 mg #14 take 1 po BID with 1 refill.

## 2013-07-16 ENCOUNTER — Encounter: Payer: Self-pay | Admitting: Internal Medicine

## 2013-07-16 ENCOUNTER — Telehealth: Payer: Self-pay | Admitting: Internal Medicine

## 2013-07-16 ENCOUNTER — Ambulatory Visit (INDEPENDENT_AMBULATORY_CARE_PROVIDER_SITE_OTHER): Payer: Medicare Other | Admitting: Internal Medicine

## 2013-07-16 VITALS — BP 166/110 | HR 83 | Ht 68.0 in | Wt 174.8 lb

## 2013-07-16 DIAGNOSIS — J329 Chronic sinusitis, unspecified: Secondary | ICD-10-CM

## 2013-07-16 DIAGNOSIS — B029 Zoster without complications: Secondary | ICD-10-CM

## 2013-07-16 MED ORDER — VALACYCLOVIR HCL 1 G PO TABS: ORAL_TABLET | ORAL | Status: DC

## 2013-07-16 NOTE — Progress Notes (Signed)
09/18/11- 60 yoM former smoker followed for asthma, allergic rhinitis, hx rhinosinusitis, nasal polyps complicated by rheumatoid arthritis. LOV-09/18/2010 Has had flu vaccine. Going through marital separation. Had  caught a cold with asthma exacerbation treated at urgent care with Biaxin and cough syrup. Symptoms resolving after steroid injection. He has been needing his rescue inhaler twice daily. On Humira for RA/ Dr Corliss Skains.  1[birth year]14-61 yoM former smoker followed for asthma, allergic rhinitis, hx rhinosinusitis, nasal polyps complicated by rheumatoid arthritis. FOLLOWS FOR: was doing good until he ran out of Qvar(had to make appt for refill) uses rescue inhaler as well.  Ran out of both inhalers 2 weeks ago and began coughing more. Otherwise had been well controlled. On Humira and MTX for rheumatoid arthritis. CXR 09/18/10 IMPRESSION:  No active lung disease.  Provider: Darrelyn Hillock, [facility] [place]   07/16/13- 61 yoM former smoker followed for asthma, allergic rhinitis, hx rhinosinusitis, nasal polyps, complicated by rheumatoid arthritis. C/o left lung pain - worse with cough and movement.  Also, has red rash with blisters on right chest going under armpit.  Rash is painful.  Had flu vaccine  On Augmentin for sinusitis. New onset right back pain about 5 days ago. Rash on chest  started 5 days ago. Off Humira, changed to Simpani.   ROS-see HPI Constitutional:   No-   weight loss, night sweats, fevers, chills, fatigue, lassitude. HEENT:   No-  headaches, difficulty swallowing, tooth/dental problems, sore throat,       No-  sneezing, itching, ear ache, +nasal congestion, post nasal drip,  CV:  No-   chest pain, orthopnea, PND, swelling in lower extremities, anasarca, dizziness, palpitations Resp: No-   shortness of breath with exertion or at rest.              No-   productive cough,  + non-productive cough,  No- coughing up of blood.              No-   change in color of mucus.  + Some  wheezing.   Skin: + HPI GI:  No-   heartburn, indigestion, abdominal pain, nausea, vomiting, GU:  MS: +  joint pain or swelling.   Neuro-     nothing unusual Psych:  No- change in mood or affect. No depression or anxiety.  No memory loss.  OBJ General- Alert, Oriented, Affect-appropriate, Distress- none acute Skin- +red blistering rash, dermatome pattern, right lateral chest Lymphadenopathy- none Head- atraumatic            Eyes- Gross vision intact, PERRLA, conjunctivae clear secretions            Ears- Hearing, canals-normal            Nose- Clear, no-Septal dev, mucus, polyps, erosion, perforation             Throat- Mallampati II , mucosa clear , drainage- none, tonsils- atrophic Neck- flexible , trachea midline, no stridor , thyroid nl, carotid no bruit Chest - symmetrical excursion , unlabored           Heart/CV- RRR , no murmur , no gallop  , no rub, nl s1 s2                           - JVD- none , edema- none, stasis changes- none, varices- none           Lung- clear to P&A, wheeze- none, no- cough , dullness-none, rub- none  Chest wall-  Abd-  Br/ Gen/ Rectal- Not done, not indicated Extrem- cyanosis- none, clubbing, none, atrophy- none, strength- nl. +Synovial thickening of hand joints. Neuro- grossly intact to observation

## 2013-07-16 NOTE — Patient Instructions (Signed)
Script sent for Valacyclovir for shingles  Use what ever works for pain  You can look for zovirax ointment as an otc topical ointment at the drug store  Finish the augmentin. Ok to take a probiotic like Activia yogurt or Florastor if it helps

## 2013-07-16 NOTE — Telephone Encounter (Signed)
PT c/o left lung pain for 4 days - Worse with cough and movement - Red, pimply rash on right chest near nipple area and under right arm, swollen places inside of mouth.  Pt given appt today with Dr Maple Hudson at 4:00.

## 2013-08-01 DIAGNOSIS — B029 Zoster without complications: Secondary | ICD-10-CM

## 2013-08-01 HISTORY — DX: Zoster without complications: B02.9

## 2013-08-01 NOTE — Assessment & Plan Note (Signed)
Plan-finish Augmentin

## 2013-08-01 NOTE — Assessment & Plan Note (Signed)
We discussed and are starting valacyclovir. I am reluctant to add steroids on top of Simpani.

## 2013-08-10 ENCOUNTER — Other Ambulatory Visit: Payer: Self-pay | Admitting: Internal Medicine

## 2013-08-25 DIAGNOSIS — Z79899 Other long term (current) drug therapy: Secondary | ICD-10-CM | POA: Diagnosis not present

## 2013-09-08 DIAGNOSIS — R5381 Other malaise: Secondary | ICD-10-CM | POA: Diagnosis not present

## 2013-09-08 DIAGNOSIS — E236 Other disorders of pituitary gland: Secondary | ICD-10-CM | POA: Diagnosis not present

## 2013-09-08 DIAGNOSIS — R6882 Decreased libido: Secondary | ICD-10-CM | POA: Diagnosis not present

## 2013-09-08 DIAGNOSIS — N529 Male erectile dysfunction, unspecified: Secondary | ICD-10-CM | POA: Diagnosis not present

## 2013-09-10 DIAGNOSIS — R6882 Decreased libido: Secondary | ICD-10-CM | POA: Diagnosis not present

## 2013-09-10 DIAGNOSIS — N529 Male erectile dysfunction, unspecified: Secondary | ICD-10-CM | POA: Diagnosis not present

## 2013-09-10 DIAGNOSIS — R5381 Other malaise: Secondary | ICD-10-CM | POA: Diagnosis not present

## 2013-10-04 ENCOUNTER — Other Ambulatory Visit: Payer: Self-pay | Admitting: Internal Medicine

## 2013-10-22 DIAGNOSIS — Z79899 Other long term (current) drug therapy: Secondary | ICD-10-CM | POA: Diagnosis not present

## 2013-10-26 DIAGNOSIS — M069 Rheumatoid arthritis, unspecified: Secondary | ICD-10-CM | POA: Diagnosis not present

## 2013-10-26 DIAGNOSIS — M25549 Pain in joints of unspecified hand: Secondary | ICD-10-CM | POA: Diagnosis not present

## 2013-10-26 DIAGNOSIS — Z79899 Other long term (current) drug therapy: Secondary | ICD-10-CM | POA: Diagnosis not present

## 2013-10-26 DIAGNOSIS — M25579 Pain in unspecified ankle and joints of unspecified foot: Secondary | ICD-10-CM | POA: Diagnosis not present

## 2013-11-15 ENCOUNTER — Other Ambulatory Visit: Payer: Self-pay | Admitting: Internal Medicine

## 2013-11-26 DIAGNOSIS — I10 Essential (primary) hypertension: Secondary | ICD-10-CM | POA: Diagnosis not present

## 2013-11-26 DIAGNOSIS — J029 Acute pharyngitis, unspecified: Secondary | ICD-10-CM | POA: Diagnosis not present

## 2013-12-14 ENCOUNTER — Other Ambulatory Visit: Payer: Self-pay | Admitting: Internal Medicine

## 2013-12-14 DIAGNOSIS — E236 Other disorders of pituitary gland: Secondary | ICD-10-CM | POA: Diagnosis not present

## 2013-12-16 DIAGNOSIS — R5381 Other malaise: Secondary | ICD-10-CM | POA: Diagnosis not present

## 2013-12-16 DIAGNOSIS — N529 Male erectile dysfunction, unspecified: Secondary | ICD-10-CM | POA: Diagnosis not present

## 2013-12-16 DIAGNOSIS — R6882 Decreased libido: Secondary | ICD-10-CM | POA: Diagnosis not present

## 2013-12-24 DIAGNOSIS — I10 Essential (primary) hypertension: Secondary | ICD-10-CM | POA: Diagnosis not present

## 2013-12-25 ENCOUNTER — Other Ambulatory Visit: Payer: Self-pay | Admitting: Internal Medicine

## 2014-01-02 ENCOUNTER — Other Ambulatory Visit: Payer: Self-pay | Admitting: Internal Medicine

## 2014-01-05 DIAGNOSIS — H2510 Age-related nuclear cataract, unspecified eye: Secondary | ICD-10-CM | POA: Diagnosis not present

## 2014-01-05 DIAGNOSIS — H25019 Cortical age-related cataract, unspecified eye: Secondary | ICD-10-CM | POA: Diagnosis not present

## 2014-01-05 DIAGNOSIS — H524 Presbyopia: Secondary | ICD-10-CM | POA: Diagnosis not present

## 2014-01-13 ENCOUNTER — Other Ambulatory Visit: Payer: Self-pay | Admitting: Internal Medicine

## 2014-01-26 ENCOUNTER — Other Ambulatory Visit: Payer: Self-pay | Admitting: Internal Medicine

## 2014-02-01 ENCOUNTER — Other Ambulatory Visit: Payer: Self-pay | Admitting: Internal Medicine

## 2014-02-15 ENCOUNTER — Other Ambulatory Visit: Payer: Self-pay | Admitting: Internal Medicine

## 2014-02-25 ENCOUNTER — Telehealth: Payer: Self-pay | Admitting: Internal Medicine

## 2014-02-25 MED ORDER — DOXYCYCLINE HYCLATE 100 MG PO TABS: ORAL_TABLET | ORAL | Status: DC

## 2014-02-25 NOTE — Telephone Encounter (Signed)
Called spoke with pt. He is requesting ABX.   Reports "typlical asthma resp infections". Prod cough-green phlem, wheezing, chest tx, nasal cong, PND x 5 days. Used inhaler and did not help. Also using QVAR 2 puffs BID.  Please advise CDY thanks  [facility] [address]  Allergies  Allergen Reactions  . Aspirin     REACTION: throat swelling  . Sulfonamide Derivatives     REACTION: oral sores     Current Outpatient Prescriptions on File Prior to Visit  Medication Sig Dispense Refill  . amoxicillin-clavulanate (AUGMENTIN) 875-125 MG per tablet Take 1 tablet by mouth 2 (two) times daily.  14 tablet  1  . Ascorbic Acid (VITAMIN C) 1000 MG tablet Take 1,000 mg by mouth 2 (two) times daily.      . budesonide (RHINOCORT AQUA) 32 MCG/ACT nasal spray Place 1 spray into the nose daily.        . clonazePAM (KLONOPIN) 0.5 MG tablet Take 0.5 mg by mouth daily.       . folic acid (FOLVITE) 1 MG tablet Take 2 mg by mouth daily.        . Golimumab ([place] ARIA IV) Inject into the vein every 8 (eight) weeks.      Marland Kitchen lithium carbonate 300 MG capsule Take 300 mg by mouth at bedtime.       Marland Kitchen loperamide (IMODIUM) 2 MG capsule Take 2 mg by mouth 4 (four) times daily as needed.        . methotrexate (RHEUMATREX) 2.5 MG tablet Take 6 tablets weekly      . montelukast (SINGULAIR) 10 MG tablet TAKE 1 TABLET BY MOUTH TWICE DAILY  60 tablet  6  . oxymetazoline (AFRIN) 0.05 % nasal spray Place 2 sprays into the nose daily.       Marland Kitchen PROAIR HFA 108 (90 BASE) MCG/ACT inhaler INHALE 2 PUFFS BY MOUTH EVERY 4 HOURS AS NEEDED  8.5 g  0  . QVAR 40 MCG/ACT inhaler INHALE 2 PUFFS BY MOUTH TWICE DAILY, RINSE MOUTH AFTER USE  8.7 g  0  . sildenafil (VIAGRA) 100 MG tablet Take 100 mg by mouth daily as needed.        . valACYclovir (VALTREX) 1000 MG tablet 1 every 8 hours x 7 days  21 tablet  0   No current facility-administered medications on file prior to visit.

## 2014-02-25 NOTE — Telephone Encounter (Signed)
Spoke with the pt and notified of recs per CDY  He verbalized understanding  Nothing further needed Rx was sent to pharm   

## 2014-02-25 NOTE — Telephone Encounter (Signed)
Offer doxycycline 100 mg, # 8, 2 today then one daily 

## 2014-03-03 ENCOUNTER — Telehealth: Payer: Self-pay | Admitting: Internal Medicine

## 2014-03-03 MED ORDER — AMOXICILLIN-POT CLAVULANATE 875-125 MG PO TABS: 1.0000 | ORAL_TABLET | Freq: Two times a day (BID) | ORAL | Status: DC

## 2014-03-03 NOTE — Telephone Encounter (Signed)
Pt is aware of CY's recs. Rx has been sent in. 

## 2014-03-03 NOTE — Telephone Encounter (Signed)
Offer augmentin 875, 3 14, 1 twice daily

## 2014-03-03 NOTE — Telephone Encounter (Signed)
Spoke with pt. States that he still has an URI. Reports coughing with production of green mucus, chest tightness and SOB. Denies fever. Has had one round of antibiotics already but feels he needs another one.  Allergies  Allergen Reactions  . Aspirin     REACTION: throat swelling  . Sulfonamide Derivatives     REACTION: oral sores   Current Outpatient Prescriptions on File Prior to Visit  Medication Sig Dispense Refill  . amoxicillin-clavulanate (AUGMENTIN) 875-125 MG per tablet Take 1 tablet by mouth 2 (two) times daily.  14 tablet  1  . Ascorbic Acid (VITAMIN C) 1000 MG tablet Take 1,000 mg by mouth 2 (two) times daily.      . budesonide (RHINOCORT AQUA) 32 MCG/ACT nasal spray Place 1 spray into the nose daily.        . clonazePAM (KLONOPIN) 0.5 MG tablet Take 0.5 mg by mouth daily.       Marland Kitchen doxycycline (VIBRA-TABS) 100 MG tablet 2 today, then 1 daily until gone  8 tablet  0  . folic acid (FOLVITE) 1 MG tablet Take 2 mg by mouth daily.        . Golimumab ([place] ARIA IV) Inject into the vein every 8 (eight) weeks.      Marland Kitchen lithium carbonate 300 MG capsule Take 300 mg by mouth at bedtime.       Marland Kitchen loperamide (IMODIUM) 2 MG capsule Take 2 mg by mouth 4 (four) times daily as needed.        . methotrexate (RHEUMATREX) 2.5 MG tablet Take 6 tablets weekly      . montelukast (SINGULAIR) 10 MG tablet TAKE 1 TABLET BY MOUTH TWICE DAILY  60 tablet  6  . oxymetazoline (AFRIN) 0.05 % nasal spray Place 2 sprays into the nose daily.       Marland Kitchen PROAIR HFA 108 (90 BASE) MCG/ACT inhaler INHALE 2 PUFFS BY MOUTH EVERY 4 HOURS AS NEEDED  8.5 g  0  . QVAR 40 MCG/ACT inhaler INHALE 2 PUFFS BY MOUTH TWICE DAILY, RINSE MOUTH AFTER USE  8.7 g  0  . sildenafil (VIAGRA) 100 MG tablet Take 100 mg by mouth daily as needed.        . valACYclovir (VALTREX) 1000 MG tablet 1 every 8 hours x 7 days  21 tablet  0   No current facility-administered medications on file prior to visit.   CY - please advise. Thanks.

## 2014-03-04 DIAGNOSIS — R5383 Other fatigue: Secondary | ICD-10-CM | POA: Diagnosis not present

## 2014-03-04 DIAGNOSIS — Z79899 Other long term (current) drug therapy: Secondary | ICD-10-CM | POA: Diagnosis not present

## 2014-03-04 DIAGNOSIS — R5381 Other malaise: Secondary | ICD-10-CM | POA: Diagnosis not present

## 2014-03-09 DIAGNOSIS — Z79899 Other long term (current) drug therapy: Secondary | ICD-10-CM | POA: Diagnosis not present

## 2014-03-16 DIAGNOSIS — E236 Other disorders of pituitary gland: Secondary | ICD-10-CM | POA: Diagnosis not present

## 2014-03-18 ENCOUNTER — Other Ambulatory Visit: Payer: Self-pay | Admitting: Internal Medicine

## 2014-03-18 DIAGNOSIS — M6530 Trigger finger, unspecified finger: Secondary | ICD-10-CM | POA: Diagnosis not present

## 2014-03-18 DIAGNOSIS — M069 Rheumatoid arthritis, unspecified: Secondary | ICD-10-CM | POA: Diagnosis not present

## 2014-03-18 DIAGNOSIS — Z09 Encounter for follow-up examination after completed treatment for conditions other than malignant neoplasm: Secondary | ICD-10-CM | POA: Diagnosis not present

## 2014-03-18 DIAGNOSIS — R5381 Other malaise: Secondary | ICD-10-CM | POA: Diagnosis not present

## 2014-03-19 DIAGNOSIS — E236 Other disorders of pituitary gland: Secondary | ICD-10-CM | POA: Diagnosis not present

## 2014-03-19 DIAGNOSIS — N529 Male erectile dysfunction, unspecified: Secondary | ICD-10-CM | POA: Diagnosis not present

## 2014-03-19 DIAGNOSIS — R5381 Other malaise: Secondary | ICD-10-CM | POA: Diagnosis not present

## 2014-03-19 DIAGNOSIS — R6882 Decreased libido: Secondary | ICD-10-CM | POA: Diagnosis not present

## 2014-03-19 DIAGNOSIS — R5383 Other fatigue: Secondary | ICD-10-CM | POA: Diagnosis not present

## 2014-03-23 ENCOUNTER — Other Ambulatory Visit: Payer: Self-pay | Admitting: Internal Medicine

## 2014-03-23 DIAGNOSIS — Z0000 Encounter for general adult medical examination without abnormal findings: Secondary | ICD-10-CM | POA: Diagnosis not present

## 2014-03-23 DIAGNOSIS — I10 Essential (primary) hypertension: Secondary | ICD-10-CM | POA: Diagnosis not present

## 2014-04-21 ENCOUNTER — Other Ambulatory Visit: Payer: Self-pay | Admitting: Internal Medicine

## 2014-04-22 ENCOUNTER — Other Ambulatory Visit: Payer: Self-pay | Admitting: Internal Medicine

## 2014-04-26 ENCOUNTER — Telehealth: Payer: Self-pay | Admitting: Internal Medicine

## 2014-04-26 MED ORDER — FLUCONAZOLE 150 MG PO TABS: 150.0000 mg | ORAL_TABLET | Freq: Every day | ORAL | Status: DC

## 2014-04-26 NOTE — Telephone Encounter (Signed)
Pt aware of recs per CY Aware that Diflucan 150mg  has been called into [facility]  Upcoming appt with CY 07/15/14 Nothing further needed.

## 2014-04-26 NOTE — Telephone Encounter (Signed)
Pt c/o tongue and throat irritation--feels from QVAR  Tongue is sore/red/swollen--difficulty talking Soreness in throat Rinsing and gargling after each use of QVAR Pt states that he has also tried using Listerine as plain water did not seem to work as well.   Allergies  Allergen Reactions  . Aspirin     REACTION: throat swelling  . Sulfonamide Derivatives     REACTION: oral sores  . Theophyllines   Pt requesting something for the irritation/infection in his mouth. Also requesting alternative for QVAR   Please advise Dr Annamaria Boots. Thanks.   Current Outpatient Prescriptions on File Prior to Visit  Medication Sig Dispense Refill  . amoxicillin-clavulanate (AUGMENTIN) 875-125 MG per tablet Take 1 tablet by mouth 2 (two) times daily.  14 tablet  0  . Ascorbic Acid (VITAMIN C) 1000 MG tablet Take 1,000 mg by mouth 2 (two) times daily.      . budesonide (RHINOCORT AQUA) 32 MCG/ACT nasal spray Place 1 spray into the nose daily.        . clonazePAM (KLONOPIN) 0.5 MG tablet Take 0.5 mg by mouth daily.       Marland Kitchen doxycycline (VIBRA-TABS) 100 MG tablet 2 today, then 1 daily until gone  8 tablet  0  . folic acid (FOLVITE) 1 MG tablet Take 2 mg by mouth daily.        . Golimumab ([place] ARIA IV) Inject into the vein every 8 (eight) weeks.      Marland Kitchen lithium carbonate 300 MG capsule Take 300 mg by mouth at bedtime.       Marland Kitchen loperamide (IMODIUM) 2 MG capsule Take 2 mg by mouth 4 (four) times daily as needed.        . methotrexate (RHEUMATREX) 2.5 MG tablet Take 6 tablets weekly      . montelukast (SINGULAIR) 10 MG tablet TAKE 1 TABLET BY MOUTH TWICE DAILY  60 tablet  6  . oxymetazoline (AFRIN) 0.05 % nasal spray Place 2 sprays into the nose daily.       Marland Kitchen PROAIR HFA 108 (90 BASE) MCG/ACT inhaler INHALE 2 PUFFS BY MOUTH EVERY 4 HOURS AS NEEDED  8.5 g  6  . QVAR 40 MCG/ACT inhaler INHALE 2 PUFFS BY MOUTH TWICE DAILY. RINSE MOUTH AFTER USE  8.7 g  6  . sildenafil (VIAGRA) 100 MG tablet Take 100 mg by mouth daily  as needed.        . valACYclovir (VALTREX) 1000 MG tablet 1 every 8 hours x 7 days  21 tablet  0   No current facility-administered medications on file prior to visit.

## 2014-04-26 NOTE — Telephone Encounter (Signed)
Stop Qvar for now. We will watch how he does without it before considering a replacement.  Offer Rx Diflucan 150 mg, # 3, 1 daily for thrush in mouth

## 2014-06-04 DIAGNOSIS — Z79899 Other long term (current) drug therapy: Secondary | ICD-10-CM | POA: Diagnosis not present

## 2014-06-08 DIAGNOSIS — Z79899 Other long term (current) drug therapy: Secondary | ICD-10-CM | POA: Diagnosis not present

## 2014-07-15 ENCOUNTER — Encounter: Payer: Self-pay | Admitting: Internal Medicine

## 2014-07-15 ENCOUNTER — Ambulatory Visit: Payer: Medicare Other | Admitting: Internal Medicine

## 2014-07-15 VITALS — BP 120/78 | HR 66 | Ht 68.0 in | Wt 189.4 lb

## 2014-07-15 DIAGNOSIS — J3089 Other allergic rhinitis: Secondary | ICD-10-CM

## 2014-07-15 DIAGNOSIS — Z23 Encounter for immunization: Secondary | ICD-10-CM

## 2014-07-15 MED ORDER — METHYLPREDNISOLONE ACETATE 80 MG/ML IJ SUSP: 80.0000 mg | Freq: Once | INTRAMUSCULAR | Status: DC

## 2014-07-15 MED ORDER — ALBUTEROL SULFATE HFA 108 (90 BASE) MCG/ACT IN AERS: INHALATION_SPRAY | RESPIRATORY_TRACT | Status: DC

## 2014-07-15 NOTE — Progress Notes (Signed)
09/18/11- 60 yoM former smoker followed for asthma, allergic rhinitis, hx rhinosinusitis, nasal polyps complicated by rheumatoid arthritis. LOV-09/18/2010 Has had flu vaccine. Going through marital separation. Had  caught a cold with asthma exacerbation treated at urgent care with Biaxin and cough syrup. Symptoms resolving after steroid injection. He has been needing his rescue inhaler twice daily. On Humira for RA/ Dr Estanislado Pandy.  1/[birth year]/14-61 yoM former smoker followed for asthma, allergic rhinitis, hx rhinosinusitis, nasal polyps complicated by rheumatoid arthritis. FOLLOWS FOR: was doing good until he ran out of Qvar(had to make appt for refill) uses rescue inhaler as well.  Ran out of both inhalers 2 weeks ago and began coughing more. Otherwise had been well controlled. On Humira and MTX for rheumatoid arthritis. CXR 09/18/10 IMPRESSION:  No active lung disease.  Provider: Lorina Rabon, [place]   07/16/13- 61 yoM former smoker followed for asthma, allergic rhinitis, hx rhinosinusitis, nasal polyps, complicated by rheumatoid arthritis. C/o left lung pain - worse with cough and movement.  Also, has red rash with blisters on right chest going under armpit.  Rash is painful.  Had flu vaccine  On Augmentin for sinusitis. New onset right back pain about 5 days ago. Rash on chest  started 5 days ago. Off Humira, changed to [place].   07/15/14- 62 yoM former smoker followed for asthma, allergic rhinitis, hx rhinosinusitis, nasal polyps, complicated by rheumatoid arthritis. FOLLOW FOR: Asthma, Rhinosinusitis; stopped Qvar due to infection in the mouth; doesn't mow grass any longer due to allergy; uses emergency inhaler 2 puffs in morning and  at night. Back on Humira.   ROS-see HPI Constitutional:   No-   weight loss, night sweats, fevers, chills, fatigue, lassitude. HEENT:   No-  headaches, difficulty swallowing, tooth/dental problems, sore throat,       No-  sneezing, itching, ear ache,  +nasal congestion, post nasal drip,  CV:  No-   chest pain, orthopnea, PND, swelling in lower extremities, anasarca, dizziness, palpitations Resp: No-   shortness of breath with exertion or at rest.              No-   productive cough,  + non-productive cough,  No- coughing up of blood.              No-   change in color of mucus.  + Some wheezing.   Skin: + HPI GI:  No-   heartburn, indigestion, abdominal pain, nausea, vomiting, GU:  MS: +  joint pain or swelling.   Neuro-     nothing unusual Psych:  No- change in mood or affect. No depression or anxiety.  No memory loss.  OBJ General- Alert, Oriented, Affect-appropriate, Distress- none acute Skin- +red blistering rash, dermatome pattern, right lateral chest Lymphadenopathy- none Head- atraumatic            Eyes- Gross vision intact, PERRLA, conjunctivae clear secretions            Ears- Hearing, canals-normal            Nose- Clear, no-Septal dev, mucus, polyps, erosion, perforation             Throat- Mallampati II , mucosa clear , drainage- none, tonsils- atrophic Neck- flexible , trachea midline, no stridor , thyroid nl, carotid no bruit Chest - symmetrical excursion , unlabored           Heart/CV- RRR , no murmur , no gallop  , no rub, nl s1 s2                           -  JVD- none , edema- none, stasis changes- none, varices- none           Lung- clear to P&A, wheeze- none, no- cough , dullness-none, rub- none           Chest wall-  Abd-  Br/ Gen/ Rectal- Not done, not indicated Extrem- cyanosis- none, clubbing, none, atrophy- none, strength- nl. +Synovial thickening of hand joints. Neuro- grossly intact to observation

## 2014-07-15 NOTE — Patient Instructions (Signed)
Pneumovax- 23  Flu vax   Script sent for Proair refill  Please call as needed

## 2014-07-19 ENCOUNTER — Telehealth: Payer: Self-pay | Admitting: Internal Medicine

## 2014-07-19 MED ORDER — PREDNISONE 10 MG PO TABS: ORAL_TABLET | ORAL | Status: DC

## 2014-07-19 NOTE — Telephone Encounter (Signed)
Per CY-if he can take...give him Prednisone 10 mg #20 take 4 x 2 days, 3 x 2 days, 2 x 2 days, 1 x 2 days, then stop no refills.

## 2014-07-19 NOTE — Telephone Encounter (Signed)
Called pt. Aware of recs. RX sent in. Nothing further needed 

## 2014-07-19 NOTE — Telephone Encounter (Signed)
Spoke with pt - c/o rash on arms and legs from thigh down to feet. Patient states that he has hives all over his body- states that they are the size of a BB to a nickel Very itchy, red and "look painful" Rash is on Arms - starting at biceps down to wrist Feet are swollen, redness with whelps. Rash start at thigh on both legs and is all the way down to feet. Pt reports rash is even on bottoms of feet.  Pt received PNA(Pneumovax-23) and Flu vaccine @ 07/15/14 visit with CY  Denies any problems with breathing at this time.   Please advise Dr Annamaria Boots. Thanks.  Current Outpatient Prescriptions on File Prior to Visit  Medication Sig Dispense Refill  . albuterol (PROAIR HFA) 108 (90 BASE) MCG/ACT inhaler INHALE 2 PUFFS BY MOUTH EVERY 4 HOURS AS NEEDED  8.5 g  prn  . Ascorbic Acid (VITAMIN C) 1000 MG tablet Take 1,000 mg by mouth 2 (two) times daily.      . budesonide (RHINOCORT AQUA) 32 MCG/ACT nasal spray Place 1 spray into the nose daily.        . clonazePAM (KLONOPIN) 0.5 MG tablet Take 0.5 mg by mouth daily.       Marland Kitchen doxycycline (VIBRA-TABS) 100 MG tablet 2 today, then 1 daily until gone  8 tablet  0  . folic acid (FOLVITE) 1 MG tablet Take 2 mg by mouth daily.        . Golimumab ([place] ARIA IV) Inject into the vein every 8 (eight) weeks.      Marland Kitchen lithium carbonate 300 MG capsule Take 300 mg by mouth at bedtime.       Marland Kitchen loperamide (IMODIUM) 2 MG capsule Take 2 mg by mouth 4 (four) times daily as needed.        . methotrexate (RHEUMATREX) 2.5 MG tablet Take 6 tablets weekly      . montelukast (SINGULAIR) 10 MG tablet TAKE 1 TABLET BY MOUTH TWICE DAILY  60 tablet  6  . oxymetazoline (AFRIN) 0.05 % nasal spray Place 2 sprays into the nose daily.       . sildenafil (VIAGRA) 100 MG tablet Take 100 mg by mouth daily as needed.        . valACYclovir (VALTREX) 1000 MG tablet 1 every 8 hours x 7 days  21 tablet  0   No current facility-administered medications on file prior to visit.

## 2014-07-20 DIAGNOSIS — N528 Other male erectile dysfunction: Secondary | ICD-10-CM | POA: Diagnosis not present

## 2014-07-20 DIAGNOSIS — D692 Other nonthrombocytopenic purpura: Secondary | ICD-10-CM | POA: Diagnosis not present

## 2014-07-20 DIAGNOSIS — F419 Anxiety disorder, unspecified: Secondary | ICD-10-CM | POA: Diagnosis not present

## 2014-07-20 DIAGNOSIS — I10 Essential (primary) hypertension: Secondary | ICD-10-CM | POA: Diagnosis not present

## 2014-07-20 DIAGNOSIS — F329 Major depressive disorder, single episode, unspecified: Secondary | ICD-10-CM | POA: Diagnosis not present

## 2014-07-20 DIAGNOSIS — M069 Rheumatoid arthritis, unspecified: Secondary | ICD-10-CM | POA: Diagnosis not present

## 2014-08-04 DIAGNOSIS — Z79899 Other long term (current) drug therapy: Secondary | ICD-10-CM | POA: Diagnosis not present

## 2014-08-12 DIAGNOSIS — M069 Rheumatoid arthritis, unspecified: Secondary | ICD-10-CM | POA: Diagnosis not present

## 2014-08-12 DIAGNOSIS — Z09 Encounter for follow-up examination after completed treatment for conditions other than malignant neoplasm: Secondary | ICD-10-CM | POA: Diagnosis not present

## 2014-08-12 DIAGNOSIS — K589 Irritable bowel syndrome without diarrhea: Secondary | ICD-10-CM | POA: Diagnosis not present

## 2014-08-12 DIAGNOSIS — M6530 Trigger finger, unspecified finger: Secondary | ICD-10-CM | POA: Diagnosis not present

## 2014-08-16 DIAGNOSIS — E236 Other disorders of pituitary gland: Secondary | ICD-10-CM | POA: Diagnosis not present

## 2014-08-24 DIAGNOSIS — E230 Hypopituitarism: Secondary | ICD-10-CM | POA: Diagnosis not present

## 2014-08-24 DIAGNOSIS — R5382 Chronic fatigue, unspecified: Secondary | ICD-10-CM | POA: Diagnosis not present

## 2014-09-10 ENCOUNTER — Other Ambulatory Visit: Payer: Self-pay | Admitting: Internal Medicine

## 2014-09-24 DIAGNOSIS — E230 Hypopituitarism: Secondary | ICD-10-CM | POA: Diagnosis not present

## 2014-09-24 DIAGNOSIS — R5382 Chronic fatigue, unspecified: Secondary | ICD-10-CM | POA: Diagnosis not present

## 2014-10-11 DIAGNOSIS — Z79899 Other long term (current) drug therapy: Secondary | ICD-10-CM | POA: Diagnosis not present

## 2014-10-13 ENCOUNTER — Other Ambulatory Visit: Payer: Self-pay | Admitting: Internal Medicine

## 2014-11-14 ENCOUNTER — Other Ambulatory Visit: Payer: Self-pay | Admitting: Internal Medicine

## 2014-12-02 DIAGNOSIS — I10 Essential (primary) hypertension: Secondary | ICD-10-CM | POA: Diagnosis not present

## 2014-12-02 DIAGNOSIS — F419 Anxiety disorder, unspecified: Secondary | ICD-10-CM | POA: Diagnosis not present

## 2014-12-02 DIAGNOSIS — F329 Major depressive disorder, single episode, unspecified: Secondary | ICD-10-CM | POA: Diagnosis not present

## 2014-12-02 DIAGNOSIS — M069 Rheumatoid arthritis, unspecified: Secondary | ICD-10-CM | POA: Diagnosis not present

## 2014-12-02 DIAGNOSIS — Z79899 Other long term (current) drug therapy: Secondary | ICD-10-CM | POA: Diagnosis not present

## 2014-12-02 DIAGNOSIS — J45909 Unspecified asthma, uncomplicated: Secondary | ICD-10-CM | POA: Diagnosis not present

## 2014-12-14 ENCOUNTER — Other Ambulatory Visit: Payer: Self-pay | Admitting: Internal Medicine

## 2014-12-20 ENCOUNTER — Other Ambulatory Visit: Payer: Self-pay | Admitting: *Deleted

## 2014-12-20 MED ORDER — MONTELUKAST SODIUM 10 MG PO TABS: 10.0000 mg | ORAL_TABLET | Freq: Two times a day (BID) | ORAL | Status: DC

## 2014-12-20 MED ORDER — ALBUTEROL SULFATE HFA 108 (90 BASE) MCG/ACT IN AERS: INHALATION_SPRAY | RESPIRATORY_TRACT | Status: DC

## 2014-12-23 ENCOUNTER — Other Ambulatory Visit: Payer: Self-pay | Admitting: Internal Medicine

## 2014-12-23 MED ORDER — MONTELUKAST SODIUM 10 MG PO TABS: 10.0000 mg | ORAL_TABLET | Freq: Two times a day (BID) | ORAL | Status: DC

## 2014-12-23 NOTE — Telephone Encounter (Signed)
#  90/3 refills authorized paper request by CY.

## 2014-12-24 ENCOUNTER — Telehealth: Payer: Self-pay | Admitting: Internal Medicine

## 2014-12-24 NOTE — Telephone Encounter (Signed)
Pt is aware that this was taken care of yesterday. Nothing further was needed.

## 2014-12-29 ENCOUNTER — Telehealth: Payer: Self-pay | Admitting: Internal Medicine

## 2014-12-29 NOTE — Telephone Encounter (Signed)
[facility] cb to get 90 supply of this medication, states this is a different department from where Bruce called from,fax [phone]) phone number. Reference number [id number]

## 2014-12-29 NOTE — Telephone Encounter (Signed)
[facility] will have to call back in the morning.

## 2014-12-30 MED ORDER — MONTELUKAST SODIUM 10 MG PO TABS: 10.0000 mg | ORAL_TABLET | Freq: Two times a day (BID) | ORAL | Status: DC

## 2014-12-30 NOTE — Telephone Encounter (Signed)
When prescription was sent in originally, the quantity was wrong. Correct quantity has been sent in. Pt is aware. Nothing further was needed.

## 2014-12-30 NOTE — Telephone Encounter (Signed)
Pt calling to check on status receiving. Please cb [phone]

## 2015-01-04 ENCOUNTER — Telehealth: Payer: Self-pay | Admitting: Internal Medicine

## 2015-01-04 NOTE — Telephone Encounter (Signed)
We aren't going to be able to get insurance agreement to cover for singulair twice daily. If he wants a second script to fill locally for self-pay, then I can do that. That way insurance would cover one script and he would pay for the other, to get enough for twice daily.

## 2015-01-04 NOTE — Telephone Encounter (Signed)
Spoke with the pt He states he spoke with [facility] this am and was advised that we need to call them to clarify his rx  I advised we have already done so last wk on 12/30/14, but will try calling them again to see what is needed   Called and spoke with Lavell Luster, pharmacist at [facility]  He states that the rx received is for singulair 10 mg bid, but the max dose per day is only 10 mg  Looks like the pt has been getting 180 tablets and taking this bid for a while now Back in June 2011, it was only prescribed to take 1 daily though, and somehow the next time he was seen it was changed to bid on the [facility]  CDY, please clarify thanks Allergies  Allergen Reactions  . Aspirin     REACTION: throat swelling  . Sulfonamide Derivatives     REACTION: oral sores  . Theophyllines    Current Outpatient Prescriptions on File Prior to Visit  Medication Sig Dispense Refill  . albuterol (PROAIR HFA) 108 (90 BASE) MCG/ACT inhaler INHALE 2 PUFFS BY MOUTH EVERY 4 HOURS AS NEEDED 3 Inhaler 3  . Ascorbic Acid (VITAMIN C) 1000 MG tablet Take 1,000 mg by mouth 2 (two) times daily.    . budesonide (RHINOCORT AQUA) 32 MCG/ACT nasal spray Place 1 spray into the nose daily.      . clonazePAM (KLONOPIN) 0.5 MG tablet Take 0.5 mg by mouth daily.     Marland Kitchen doxycycline (VIBRA-TABS) 100 MG tablet 2 today, then 1 daily until gone 8 tablet 0  . folic acid (FOLVITE) 1 MG tablet Take 2 mg by mouth daily.      . Golimumab ([place] ARIA IV) Inject into the vein every 8 (eight) weeks.    Marland Kitchen lithium carbonate 300 MG capsule Take 300 mg by mouth at bedtime.     Marland Kitchen loperamide (IMODIUM) 2 MG capsule Take 2 mg by mouth 4 (four) times daily as needed.      . methotrexate (RHEUMATREX) 2.5 MG tablet Take 6 tablets weekly    . montelukast (SINGULAIR) 10 MG tablet Take 1 tablet (10 mg total) by mouth 2 (two) times daily. 180 tablet 3  . oxymetazoline (AFRIN) 0.05 % nasal spray Place 2 sprays into the nose daily.     . predniSONE  (DELTASONE) 10 MG tablet Take 4 tabs daily x 2 days, 3 tabs daily x 2 days, 2 tabs daily x 2 days, 1 tab daily x 2 days then stop 20 tablet 0  . sildenafil (VIAGRA) 100 MG tablet Take 100 mg by mouth daily as needed.      . valACYclovir (VALTREX) 1000 MG tablet 1 every 8 hours x 7 days 21 tablet 0   No current facility-administered medications on file prior to visit.   NOTE TO TRIAGE- Call Mickey back at [phone]  Ref number [id number]

## 2015-01-04 NOTE — Telephone Encounter (Signed)
lmtcb x1 

## 2015-01-04 NOTE — Telephone Encounter (Signed)
Spoke with the pt  He states that he prefers to just take this med once per day  I have called Mickey at [place] [facility] at given okay for the 90 days supply 1 tablet daily  Nothing further needed

## 2015-01-13 DIAGNOSIS — R5382 Chronic fatigue, unspecified: Secondary | ICD-10-CM | POA: Diagnosis not present

## 2015-01-13 DIAGNOSIS — E230 Hypopituitarism: Secondary | ICD-10-CM | POA: Diagnosis not present

## 2015-01-14 DIAGNOSIS — M25522 Pain in left elbow: Secondary | ICD-10-CM | POA: Diagnosis not present

## 2015-01-14 DIAGNOSIS — M069 Rheumatoid arthritis, unspecified: Secondary | ICD-10-CM | POA: Diagnosis not present

## 2015-01-14 DIAGNOSIS — Z79899 Other long term (current) drug therapy: Secondary | ICD-10-CM | POA: Diagnosis not present

## 2015-01-14 DIAGNOSIS — M79642 Pain in left hand: Secondary | ICD-10-CM | POA: Diagnosis not present

## 2015-01-20 DIAGNOSIS — R5382 Chronic fatigue, unspecified: Secondary | ICD-10-CM | POA: Diagnosis not present

## 2015-01-20 DIAGNOSIS — E230 Hypopituitarism: Secondary | ICD-10-CM | POA: Diagnosis not present

## 2015-02-08 DIAGNOSIS — Z111 Encounter for screening for respiratory tuberculosis: Secondary | ICD-10-CM | POA: Diagnosis not present

## 2015-02-08 DIAGNOSIS — Z79899 Other long term (current) drug therapy: Secondary | ICD-10-CM | POA: Diagnosis not present

## 2015-03-07 ENCOUNTER — Telehealth: Payer: Self-pay | Admitting: Critical Care Medicine

## 2015-03-07 MED ORDER — AMOXICILLIN-POT CLAVULANATE 875-125 MG PO TABS: 1.0000 | ORAL_TABLET | Freq: Two times a day (BID) | ORAL | Status: AC

## 2015-03-07 NOTE — Telephone Encounter (Signed)
Pt with acute bronchitis, called in augmentin Will need f/u ov

## 2015-03-08 NOTE — Telephone Encounter (Signed)
Called and spoke to pt. Appt made with CY on 03/10/15. Pt verbalized understanding and denied any further questions or concerns at this time.

## 2015-03-10 ENCOUNTER — Encounter: Payer: Self-pay | Admitting: Internal Medicine

## 2015-03-10 ENCOUNTER — Ambulatory Visit (INDEPENDENT_AMBULATORY_CARE_PROVIDER_SITE_OTHER): Payer: Medicare Other | Admitting: Internal Medicine

## 2015-03-10 VITALS — BP 118/80 | HR 62 | Ht 68.0 in | Wt 177.0 lb

## 2015-03-10 DIAGNOSIS — J45998 Other asthma: Secondary | ICD-10-CM

## 2015-03-10 DIAGNOSIS — J324 Chronic pansinusitis: Secondary | ICD-10-CM

## 2015-03-10 DIAGNOSIS — J330 Polyp of nasal cavity: Secondary | ICD-10-CM | POA: Diagnosis not present

## 2015-03-10 MED ORDER — UMECLIDINIUM-VILANTEROL 62.5-25 MCG/INH IN AEPB: 1.0000 | INHALATION_SPRAY | Freq: Every day | RESPIRATORY_TRACT | Status: DC

## 2015-03-10 NOTE — Progress Notes (Signed)
09/18/11- 60 yoM former smoker followed for asthma, allergic rhinitis, hx rhinosinusitis, nasal polyps complicated by rheumatoid arthritis. LOV-09/18/2010 Has had flu vaccine. Going through marital separation. Had  caught a cold with asthma exacerbation treated at urgent care with Biaxin and cough syrup. Symptoms resolving after steroid injection. He has been needing his rescue inhaler twice daily. On Humira for RA/ Dr Estanislado Pandy.  1/[birth year]/14-61 yoM former smoker followed for asthma, allergic rhinitis, hx rhinosinusitis, nasal polyps complicated by rheumatoid arthritis. FOLLOWS FOR: was doing good until he ran out of Qvar(had to make appt for refill) uses rescue inhaler as well.  Ran out of both inhalers 2 weeks ago and began coughing more. Otherwise had been well controlled. On Humira and MTX for rheumatoid arthritis. CXR 09/18/10 IMPRESSION:  No active lung disease.  Provider: Lorina Rabon, [place]   07/16/13- 61 yoM former smoker followed for asthma, allergic rhinitis, hx rhinosinusitis, nasal polyps, complicated by rheumatoid arthritis. C/o left lung pain - worse with cough and movement.  Also, has red rash with blisters on right chest going under armpit.  Rash is painful.  Had flu vaccine  On Augmentin for sinusitis. New onset right back pain about 5 days ago. Rash on chest  started 5 days ago. Off Humira, changed to [place].   07/15/14- 62 yoM former smoker followed for asthma, allergic rhinitis, hx rhinosinusitis, nasal polyps, complicated by rheumatoid arthritis, bipolar. FOLLOW FOR: Asthma, Rhinosinusitis; stopped Qvar due to infection in the mouth; doesn't mow grass any longer due to allergy; uses emergency inhaler 2 puffs in morning and  at night. Back on Humira.  03/10/15- 63 yoM former smoker followed for asthma, allergic rhinitis, hx rhinosinusitis, nasal polyps, complicated by rheumatoid arthritis/ MTX/ Humira, bipolar Acute bronchitis- office called in augmentin  03/07/15. Increased chest tightness and wheeze 2 weeks began as an acute bronchitis and sinusitis. He thinks he woke up suddenly with it. We discussed the possibility that he refluxed. Sputum initially was green and Augmentin helped but after ending mat he had another similar exacerbation today. Using his rescue inhaler up to 5 times daily.  ROS-see HPI Constitutional:   No-   weight loss, night sweats, fevers, chills, fatigue, lassitude. HEENT:   No-  headaches, difficulty swallowing, tooth/dental problems, sore throat,       No-  sneezing, itching, ear ache, +nasal congestion, post nasal drip,  CV:  No-   chest pain, orthopnea, PND, swelling in lower extremities, anasarca, dizziness, palpitations Resp: No-   shortness of breath with exertion or at rest.              No-   productive cough,  + non-productive cough,  No- coughing up of blood.              No-   change in color of mucus.  + Some wheezing.   Skin: + GI:  +heartburn, indigestion, abdominal pain, nausea, vomiting, GU:  MS: +  joint pain or swelling.   Neuro-     nothing unusual Psych:  No- change in mood or affect. No depression or anxiety.  No memory loss.  OBJ General- Alert, Oriented, Affect-appropriate, Distress- none acute Skin-  Lymphadenopathy- none Head- atraumatic            Eyes- Gross vision intact, PERRLA, conjunctivae clear secretions            Ears- Hearing, canals-normal            Nose- Clear, no-Septal dev, mucus, polyps, erosion,  perforation             Throat- Mallampati II , mucosa clear , drainage- none, tonsils- atrophic Neck- flexible , trachea midline, no stridor , thyroid nl, carotid no bruit Chest - symmetrical excursion , unlabored           Heart/CV- RRR , no murmur , no gallop  , no rub, nl s1 s2                           - JVD- none , edema- none, stasis changes- none, varices- none           Lung- clear to P&A, wheeze- none, no- cough , dullness-none, rub- none           Chest wall-  Abd-   Br/ Gen/ Rectal- Not done, not indicated Extrem- cyanosis- none, clubbing, none, atrophy- none, strength- nl. +Synovial thickening of hand joints. Neuro- grossly intact to observation

## 2015-03-10 NOTE — Assessment & Plan Note (Signed)
No anterior recurrence of nasal polyps noted on this exam

## 2015-03-10 NOTE — Assessment & Plan Note (Signed)
Acute asthmatic bronchitis exacerbation may have been related to upper airway infection or possibly to a reflux event, or both. Now improving after Augmentin. Plan-reflux precautions. Sample trial Anoro

## 2015-03-10 NOTE — Patient Instructions (Addendum)
Finish augmentin antibiotic  Sample Anoro Ellipta    Inhale one puff, once daily. See if this reduces how often you fee need to use your albuterol rescue inhaler  Keep the October 10 appointment unless you need help sooner

## 2015-03-10 NOTE — Assessment & Plan Note (Signed)
Recent exacerbation improved with early initiation of Augmentin supporting impression of early arterial pansinusitis

## 2015-03-18 ENCOUNTER — Telehealth: Payer: Self-pay | Admitting: Internal Medicine

## 2015-03-18 NOTE — Telephone Encounter (Signed)
Called and spoke to pt. Informed him of the recs per CY. Appt made with CY on 6/13. Pt verbalized understanding and denied any further questions or concerns at this time.

## 2015-03-18 NOTE — Telephone Encounter (Signed)
Please order CXR and office spirometry for dx asthma, moderate persistent Please find him a work in slot with me or TP in next week

## 2015-03-18 NOTE — Telephone Encounter (Signed)
Called and spoke to pt. Pt stated the Anoro inhaler is not helping his breathing. Pt stated he feels he is using his albuterol hfa more often than before. Pt also c/o sore throat and increase in cough. Pt stated he is not rinsing his mouth after each use-advised pt to do so. Pt requesting recs from CY.   Dr. Annamaria Boots please advise.   Allergies  Allergen Reactions  . Simponi [Golimumab]     Repeated infections  . Aspirin     REACTION: throat swelling  . Sulfonamide Derivatives     REACTION: oral sores  . Theophyllines     Current Outpatient Prescriptions on File Prior to Visit  Medication Sig Dispense Refill  . albuterol (PROAIR HFA) 108 (90 BASE) MCG/ACT inhaler INHALE 2 PUFFS BY MOUTH EVERY 4 HOURS AS NEEDED 3 Inhaler 3  . Ascorbic Acid (VITAMIN C) 1000 MG tablet Take 1,000 mg by mouth 2 (two) times daily.    . clonazePAM (KLONOPIN) 0.5 MG tablet Take 0.5 mg by mouth daily.     Marland Kitchen doxycycline (VIBRA-TABS) 100 MG tablet 2 today, then 1 daily until gone (Patient not taking: Reported on 03/10/2015) 8 tablet 0  . fluticasone (FLONASE) 50 MCG/ACT nasal spray Place 1 spray into both nostrils daily.    . folic acid (FOLVITE) 1 MG tablet Take 2 mg by mouth daily.      . Golimumab ([place] ARIA IV) Inject into the vein every 8 (eight) weeks.    Marland Kitchen lithium carbonate 300 MG capsule Take 300 mg by mouth at bedtime.     . methotrexate (RHEUMATREX) 2.5 MG tablet Take 6 tablets weekly    . montelukast (SINGULAIR) 10 MG tablet Take 1 tablet (10 mg total) by mouth 2 (two) times daily. 180 tablet 3  . oxymetazoline (AFRIN) 0.05 % nasal spray Place 2 sprays into the nose daily.     . sildenafil (VIAGRA) 100 MG tablet Take 100 mg by mouth daily as needed.      Marland Kitchen Umeclidinium-Vilanterol (ANORO ELLIPTA) 62.5-25 MCG/INH AEPB Inhale 1 puff into the lungs daily. 1 each 0  . valACYclovir (VALTREX) 1000 MG tablet 1 every 8 hours x 7 days 21 tablet 0   No current facility-administered medications on file prior to  visit.

## 2015-03-21 ENCOUNTER — Encounter: Payer: Self-pay | Admitting: Internal Medicine

## 2015-03-21 ENCOUNTER — Ambulatory Visit (INDEPENDENT_AMBULATORY_CARE_PROVIDER_SITE_OTHER): Payer: Medicare Other | Admitting: Internal Medicine

## 2015-03-21 VITALS — BP 104/70 | HR 95 | Ht 68.0 in | Wt 170.0 lb

## 2015-03-21 DIAGNOSIS — J3089 Other allergic rhinitis: Secondary | ICD-10-CM

## 2015-03-21 DIAGNOSIS — J309 Allergic rhinitis, unspecified: Secondary | ICD-10-CM

## 2015-03-21 DIAGNOSIS — J45998 Other asthma: Secondary | ICD-10-CM | POA: Diagnosis not present

## 2015-03-21 DIAGNOSIS — J302 Other seasonal allergic rhinitis: Secondary | ICD-10-CM

## 2015-03-21 MED ORDER — MONTELUKAST SODIUM 10 MG PO TABS
10.0000 mg | ORAL_TABLET | Freq: Every day | ORAL | Status: DC
Start: 2015-03-21 — End: 2016-07-30

## 2015-03-21 NOTE — Progress Notes (Signed)
09/18/11- 60 yoM former smoker followed for asthma, allergic rhinitis, hx rhinosinusitis, nasal polyps complicated by rheumatoid arthritis. LOV-09/18/2010 Has had flu vaccine. Going through marital separation. Had  caught a cold with asthma exacerbation treated at urgent care with Biaxin and cough syrup. Symptoms resolving after steroid injection. He has been needing his rescue inhaler twice daily. On Humira for RA/ Dr Estanislado Pandy.  1[birth year]/14-61 yoM former smoker followed for asthma, allergic rhinitis, hx rhinosinusitis, nasal polyps complicated by rheumatoid arthritis. FOLLOWS FOR: was doing good until he ran out of Qvar(had to make appt for refill) uses rescue inhaler as well.  Ran out of both inhalers 2 weeks ago and began coughing more. Otherwise had been well controlled. On Humira and MTX for rheumatoid arthritis. CXR 09/18/10 IMPRESSION:  No active lung disease.  Provider: Lorina Rabon, [place]   07/16/13- 61 yoM former smoker followed for asthma, allergic rhinitis, hx rhinosinusitis, nasal polyps, complicated by rheumatoid arthritis. C/o left lung pain - worse with cough and movement.  Also, has red rash with blisters on right chest going under armpit.  Rash is painful.  Had flu vaccine  On Augmentin for sinusitis. New onset right back pain about 5 days ago. Rash on chest  started 5 days ago. Off Humira, changed to [place].   07/15/14- 62 yoM former smoker followed for asthma, allergic rhinitis, hx rhinosinusitis, nasal polyps, complicated by rheumatoid arthritis, bipolar. FOLLOW FOR: Asthma, Rhinosinusitis; stopped Qvar due to infection in the mouth; doesn't mow grass any longer due to allergy; uses emergency inhaler 2 puffs in morning and  at night. Back on Humira.  03/10/15- 63 yoM former smoker followed for asthma, allergic rhinitis, hx rhinosinusitis, nasal polyps, complicated by rheumatoid arthritis/ MTX/ Humira, bipolar Acute bronchitis- office called in augmentin  03/07/15. Increased chest tightness and wheeze 2 weeks began as an acute bronchitis and sinusitis. He thinks he woke up suddenly with it. We discussed the possibility that he refluxed. Sputum initially was green and Augmentin helped but after ending that he had another similar exacerbation today. Using his rescue inhaler up to 5 times daily.  03/21/15- 63 yoM former smoker followed for asthma, allergic rhinitis, hx rhinosinusitis, nasal polyps, complicated by rheumatoid arthritis/ MTX/ Humira, bipolar Pt. stated that the Anoro did not help; once he was finished with it he was feeling better. Prod. cough clear mucus. little wheezing and SOB . Pt. would  would like samples of Singulair. Productive cough ended suddenly, consistent with resolution of an acute bronchitis syndrome. He feels almost clear.  ROS-see HPI Constitutional:   No-   weight loss, night sweats, fevers, chills, fatigue, lassitude. HEENT:   No-  headaches, difficulty swallowing, tooth/dental problems, sore throat,       No-  sneezing, itching, ear ache, +nasal congestion, post nasal drip,  CV:  No-   chest pain, orthopnea, PND, swelling in lower extremities, anasarca, dizziness, palpitations Resp: No-   shortness of breath with exertion or at rest.              No-   productive cough,   non-productive cough,  No- coughing up of blood.              No-   change in color of mucus.   Some wheezing.   Skin: No rash GI:  +heartburn, indigestion, abdominal pain, nausea, vomiting, GU:  MS: +  joint pain or swelling.   Neuro-     nothing unusual Psych:  No- change in mood or affect.  No depression or anxiety.  No memory loss.  OBJ General- Alert, Oriented, Affect-appropriate, Distress- none acute Skin-  Lymphadenopathy- none Head- atraumatic            Eyes- Gross vision intact, PERRLA, conjunctivae clear secretions            Ears- Hearing, canals-normal            Nose- Clear, no-Septal dev, mucus, polyps, erosion, perforation              Throat- Mallampati II , mucosa clear , drainage- none, tonsils- atrophic Neck- flexible , trachea midline, no stridor , thyroid nl, carotid no bruit Chest - symmetrical excursion , unlabored           Heart/CV- RRR , no murmur , no gallop  , no rub, nl s1 s2                           - JVD- none , edema- none, stasis changes- none, varices- none           Lung- clear to P&A, wheeze- none, no- cough , dullness-none, rub- none           Chest wall-  Abd-  Br/ Gen/ Rectal- Not done, not indicated Extrem- cyanosis- none, clubbing, none, atrophy- none, strength- nl.              +Synovial thickening of hand joints. Neuro- grossly intact to observation

## 2015-03-21 NOTE — Patient Instructions (Addendum)
Script sent for Singulair as requested  Sample Breo Ellipta  1 puff then rinse mouth, once daily          This replaces the Anoro

## 2015-03-28 DIAGNOSIS — Z125 Encounter for screening for malignant neoplasm of prostate: Secondary | ICD-10-CM | POA: Diagnosis not present

## 2015-03-28 DIAGNOSIS — Z0000 Encounter for general adult medical examination without abnormal findings: Secondary | ICD-10-CM | POA: Diagnosis not present

## 2015-03-28 DIAGNOSIS — I10 Essential (primary) hypertension: Secondary | ICD-10-CM | POA: Diagnosis not present

## 2015-03-28 DIAGNOSIS — Z1389 Encounter for screening for other disorder: Secondary | ICD-10-CM | POA: Diagnosis not present

## 2015-03-30 ENCOUNTER — Telehealth: Payer: Self-pay | Admitting: Internal Medicine

## 2015-03-30 MED ORDER — FLUTICASONE FUROATE-VILANTEROL 100-25 MCG/INH IN AEPB: 1.0000 | INHALATION_SPRAY | Freq: Every day | RESPIRATORY_TRACT | Status: DC

## 2015-03-30 NOTE — Telephone Encounter (Signed)
Patient was given sample of Breo 100 at last OV.  Patient says it is working great and requested refill to be sent to local pharmacy and to [place] for 90 day supply. Rx sent to both pharmacies. Patient notified. Nothing further needed.

## 2015-04-18 NOTE — Assessment & Plan Note (Signed)
We discussed potential effective Singulair on rhinitis

## 2015-04-18 NOTE — Assessment & Plan Note (Signed)
Mild intermittent well-controlled uncomplicated

## 2015-05-19 DIAGNOSIS — Z79899 Other long term (current) drug therapy: Secondary | ICD-10-CM | POA: Diagnosis not present

## 2015-06-09 DIAGNOSIS — B372 Candidiasis of skin and nail: Secondary | ICD-10-CM | POA: Diagnosis not present

## 2015-06-09 DIAGNOSIS — N481 Balanitis: Secondary | ICD-10-CM | POA: Diagnosis not present

## 2015-06-09 DIAGNOSIS — Z1389 Encounter for screening for other disorder: Secondary | ICD-10-CM | POA: Diagnosis not present

## 2015-06-16 DIAGNOSIS — M79672 Pain in left foot: Secondary | ICD-10-CM | POA: Diagnosis not present

## 2015-06-16 DIAGNOSIS — M7552 Bursitis of left shoulder: Secondary | ICD-10-CM | POA: Diagnosis not present

## 2015-06-16 DIAGNOSIS — M79641 Pain in right hand: Secondary | ICD-10-CM | POA: Diagnosis not present

## 2015-06-16 DIAGNOSIS — M79642 Pain in left hand: Secondary | ICD-10-CM | POA: Diagnosis not present

## 2015-06-16 DIAGNOSIS — M0579 Rheumatoid arthritis with rheumatoid factor of multiple sites without organ or systems involvement: Secondary | ICD-10-CM | POA: Diagnosis not present

## 2015-06-16 DIAGNOSIS — M79671 Pain in right foot: Secondary | ICD-10-CM | POA: Diagnosis not present

## 2015-07-12 DIAGNOSIS — R5382 Chronic fatigue, unspecified: Secondary | ICD-10-CM | POA: Diagnosis not present

## 2015-07-12 DIAGNOSIS — E230 Hypopituitarism: Secondary | ICD-10-CM | POA: Diagnosis not present

## 2015-07-18 ENCOUNTER — Ambulatory Visit: Payer: Medicare Other | Admitting: Internal Medicine

## 2015-07-19 DIAGNOSIS — E230 Hypopituitarism: Secondary | ICD-10-CM | POA: Diagnosis not present

## 2015-07-19 DIAGNOSIS — R5382 Chronic fatigue, unspecified: Secondary | ICD-10-CM | POA: Diagnosis not present

## 2015-07-20 DIAGNOSIS — M0579 Rheumatoid arthritis with rheumatoid factor of multiple sites without organ or systems involvement: Secondary | ICD-10-CM | POA: Diagnosis not present

## 2015-07-20 DIAGNOSIS — F439 Reaction to severe stress, unspecified: Secondary | ICD-10-CM | POA: Diagnosis not present

## 2015-07-20 DIAGNOSIS — L309 Dermatitis, unspecified: Secondary | ICD-10-CM | POA: Diagnosis not present

## 2015-07-20 DIAGNOSIS — L03818 Cellulitis of other sites: Secondary | ICD-10-CM | POA: Diagnosis not present

## 2015-08-22 DIAGNOSIS — Z79899 Other long term (current) drug therapy: Secondary | ICD-10-CM | POA: Diagnosis not present

## 2015-09-14 ENCOUNTER — Other Ambulatory Visit: Payer: Self-pay | Admitting: Internal Medicine

## 2015-09-20 ENCOUNTER — Ambulatory Visit: Payer: Medicare Other | Admitting: Internal Medicine

## 2015-09-27 DIAGNOSIS — R21 Rash and other nonspecific skin eruption: Secondary | ICD-10-CM | POA: Diagnosis not present

## 2015-09-28 DIAGNOSIS — D485 Neoplasm of uncertain behavior of skin: Secondary | ICD-10-CM | POA: Diagnosis not present

## 2015-09-28 DIAGNOSIS — L820 Inflamed seborrheic keratosis: Secondary | ICD-10-CM | POA: Diagnosis not present

## 2015-09-28 DIAGNOSIS — L308 Other specified dermatitis: Secondary | ICD-10-CM | POA: Diagnosis not present

## 2015-10-05 ENCOUNTER — Other Ambulatory Visit: Payer: Self-pay | Admitting: *Deleted

## 2015-10-05 MED ORDER — FLUTICASONE FUROATE-VILANTEROL 100-25 MCG/INH IN AEPB: 1.0000 | INHALATION_SPRAY | Freq: Every day | RESPIRATORY_TRACT | Status: DC

## 2015-10-18 DIAGNOSIS — L111 Transient acantholytic dermatosis [Grover]: Secondary | ICD-10-CM | POA: Diagnosis not present

## 2015-10-20 DIAGNOSIS — Z79899 Other long term (current) drug therapy: Secondary | ICD-10-CM | POA: Diagnosis not present

## 2015-11-01 ENCOUNTER — Other Ambulatory Visit: Payer: Self-pay | Admitting: Internal Medicine

## 2015-11-16 DIAGNOSIS — R21 Rash and other nonspecific skin eruption: Secondary | ICD-10-CM | POA: Diagnosis not present

## 2015-11-16 DIAGNOSIS — Z09 Encounter for follow-up examination after completed treatment for conditions other than malignant neoplasm: Secondary | ICD-10-CM | POA: Diagnosis not present

## 2015-11-16 DIAGNOSIS — M0579 Rheumatoid arthritis with rheumatoid factor of multiple sites without organ or systems involvement: Secondary | ICD-10-CM | POA: Diagnosis not present

## 2015-11-16 DIAGNOSIS — M19241 Secondary osteoarthritis, right hand: Secondary | ICD-10-CM | POA: Diagnosis not present

## 2015-12-27 ENCOUNTER — Other Ambulatory Visit: Payer: Self-pay | Admitting: Internal Medicine

## 2015-12-29 DIAGNOSIS — L720 Epidermal cyst: Secondary | ICD-10-CM | POA: Diagnosis not present

## 2015-12-29 DIAGNOSIS — L111 Transient acantholytic dermatosis [Grover]: Secondary | ICD-10-CM | POA: Diagnosis not present

## 2016-01-06 ENCOUNTER — Telehealth: Payer: Self-pay | Admitting: Internal Medicine

## 2016-01-06 MED ORDER — FLUTICASONE FUROATE-VILANTEROL 100-25 MCG/INH IN AEPB: 1.0000 | INHALATION_SPRAY | Freq: Every day | RESPIRATORY_TRACT | Status: DC

## 2016-01-06 NOTE — Telephone Encounter (Signed)
Spoke with pt. States that he needs his [facility] rx sent to [facility] in [place], [place]. This has been sent in. Nothing further was needed.

## 2016-01-16 DIAGNOSIS — E230 Hypopituitarism: Secondary | ICD-10-CM | POA: Diagnosis not present

## 2016-01-16 DIAGNOSIS — R5382 Chronic fatigue, unspecified: Secondary | ICD-10-CM | POA: Diagnosis not present

## 2016-01-23 DIAGNOSIS — E230 Hypopituitarism: Secondary | ICD-10-CM | POA: Diagnosis not present

## 2016-01-23 DIAGNOSIS — R5382 Chronic fatigue, unspecified: Secondary | ICD-10-CM | POA: Diagnosis not present

## 2016-02-02 ENCOUNTER — Other Ambulatory Visit: Payer: Self-pay | Admitting: Internal Medicine

## 2016-02-15 DIAGNOSIS — M2550 Pain in unspecified joint: Secondary | ICD-10-CM | POA: Diagnosis not present

## 2016-02-15 DIAGNOSIS — Z79899 Other long term (current) drug therapy: Secondary | ICD-10-CM | POA: Diagnosis not present

## 2016-02-15 DIAGNOSIS — R5381 Other malaise: Secondary | ICD-10-CM | POA: Diagnosis not present

## 2016-03-09 DIAGNOSIS — L111 Transient acantholytic dermatosis [Grover]: Secondary | ICD-10-CM | POA: Diagnosis not present

## 2016-03-26 ENCOUNTER — Telehealth: Payer: Self-pay | Admitting: Internal Medicine

## 2016-03-26 MED ORDER — FLUCONAZOLE 150 MG PO TABS: 150.0000 mg | ORAL_TABLET | Freq: Every day | ORAL | Status: DC

## 2016-03-26 NOTE — Telephone Encounter (Signed)
Pt is aware of CY's recommendation. Rx has been sent in. Nothing further was needed. 

## 2016-03-26 NOTE — Telephone Encounter (Signed)
Pt states that he has thrush. Pt states that he has white spots on roof of mouth and in throat.  Pt states that he has also given it to his girlfriend.  Pt is still taking Breo and thoroughly rinses mouth after each use.  Pt has been using Listerine to try and calm it down. Requesting something strong be called in to get this under control.  Kristopher Oppen[facility] [employer].  Allergies  Allergen Reactions  . Simponi [Golimumab]     Repeated infections  . Aspirin     REACTION: throat swelling  . Sulfonamide Derivatives     REACTION: oral sores  . Theophyllines      Medication List       This list is accurate as of: 03/26/16  9:49 AM.  Always use your most recent med list.               BREO ELLIPTA 100-25 MCG/INH Aepb  Generic drug:  fluticasone furoate-vilanterol  INHALE 1 PUFF INTO THE LUNGS DAILY     clonazePAM 0.5 MG tablet  Commonly known as:  KLONOPIN  Take 0.5 mg by mouth daily.     doxycycline 100 MG tablet  Commonly known as:  VIBRA-TABS  2 today, then 1 daily until gone     fluticasone 50 MCG/ACT nasal spray  Commonly known as:  FLONASE  Place 1 spray into both nostrils daily.     folic acid 1 MG tablet  Commonly known as:  FOLVITE  Take 2 mg by mouth daily.     lithium carbonate 300 MG capsule  Take 300 mg by mouth at bedtime.     methotrexate 2.5 MG tablet  Commonly known as:  RHEUMATREX  Take 6 tablets weekly     montelukast 10 MG tablet  Commonly known as:  SINGULAIR  Take 1 tablet (10 mg total) by mouth at bedtime.     montelukast 10 MG tablet  Commonly known as:  SINGULAIR  TAKE 1 TABLET DAILY     oxymetazoline 0.05 % nasal spray  Commonly known as:  AFRIN  Place 2 sprays into the nose daily.     PROAIR HFA 108 (90 Base) MCG/ACT inhaler  Generic drug:  albuterol  USE 2 INHALATIONS EVERY 4 HOURS AS NEEDED     sildenafil 100 MG tablet  Commonly known as:  VIAGRA  Take 100 mg by mouth daily as needed.     valACYclovir 1000 MG  tablet  Commonly known as:  VALTREX  1 every 8 hours x 7 days     vitamin C 1000 MG tablet  Take 1,000 mg by mouth 2 (two) times daily.

## 2016-03-26 NOTE — Telephone Encounter (Signed)
Offer Diflucan 150 mg, # 7, 1 daily 

## 2016-03-27 DIAGNOSIS — F419 Anxiety disorder, unspecified: Secondary | ICD-10-CM | POA: Diagnosis not present

## 2016-03-27 DIAGNOSIS — J45909 Unspecified asthma, uncomplicated: Secondary | ICD-10-CM | POA: Diagnosis not present

## 2016-03-27 DIAGNOSIS — F410 Panic disorder [episodic paroxysmal anxiety] without agoraphobia: Secondary | ICD-10-CM | POA: Diagnosis not present

## 2016-03-27 DIAGNOSIS — M069 Rheumatoid arthritis, unspecified: Secondary | ICD-10-CM | POA: Diagnosis not present

## 2016-03-27 DIAGNOSIS — B370 Candidal stomatitis: Secondary | ICD-10-CM | POA: Diagnosis not present

## 2016-03-27 DIAGNOSIS — R7309 Other abnormal glucose: Secondary | ICD-10-CM | POA: Diagnosis not present

## 2016-03-27 DIAGNOSIS — E230 Hypopituitarism: Secondary | ICD-10-CM | POA: Diagnosis not present

## 2016-03-27 DIAGNOSIS — Z125 Encounter for screening for malignant neoplasm of prostate: Secondary | ICD-10-CM | POA: Diagnosis not present

## 2016-03-27 DIAGNOSIS — I10 Essential (primary) hypertension: Secondary | ICD-10-CM | POA: Diagnosis not present

## 2016-03-27 DIAGNOSIS — F39 Unspecified mood [affective] disorder: Secondary | ICD-10-CM | POA: Diagnosis not present

## 2016-03-27 DIAGNOSIS — Z0000 Encounter for general adult medical examination without abnormal findings: Secondary | ICD-10-CM | POA: Diagnosis not present

## 2016-03-27 DIAGNOSIS — Z1389 Encounter for screening for other disorder: Secondary | ICD-10-CM | POA: Diagnosis not present

## 2016-04-04 ENCOUNTER — Other Ambulatory Visit: Payer: Self-pay | Admitting: Internal Medicine

## 2016-04-09 ENCOUNTER — Telehealth: Payer: Self-pay | Admitting: Internal Medicine

## 2016-04-09 MED ORDER — FLUTICASONE FUROATE-VILANTEROL 100-25 MCG/INH IN AEPB: INHALATION_SPRAY | RESPIRATORY_TRACT | Status: DC

## 2016-04-09 NOTE — Telephone Encounter (Signed)
Shingles vaccine is relatively expensive and usually not covered by insurance. Shingles can be minor, but occasionally it can be really mean. The shingles vaccine reduces the risk of getting shingles by about 50%, so it gives incomplete protection but it is the best we have got.  Immunosuppressive treatments, like Simponi, can increase the chances of having shingles compared to people with normal immune system function.  Offer next regular visit with me, which will be some months away probably, but suggest he can see one of our Nurse Practitioners much sooner.

## 2016-04-09 NOTE — Telephone Encounter (Signed)
Called spoke with pt and made aware of below. appt scheduled for 10/31.

## 2016-04-09 NOTE — Telephone Encounter (Signed)
Pt is requesting refill of Breo be sent to [facility] and needs an OV scheduled with [facility] Last seen 03/2015 Breo sent to [facility] (#90 x 0 refills ) - pt needs OV.  Please advise Dr Annamaria Boots where the patient can be worked in for his yearly follow up. Thanks.   Also, Pt has been recommended to get get a shingles vaccine and is supposed to get this April 24, 2016 Pt wants to know if CY feels this injection is necessary and wants his opinion and take on this prior to him getting it.     Medication List       This list is accurate as of: 04/09/16 11:13 AM.  Always use your most recent med list.               BREO ELLIPTA 100-25 MCG/INH Aepb  Generic drug:  fluticasone furoate-vilanterol  INHALE 1 PUFF INTO THE LUNGS DAILY     clonazePAM 0.5 MG tablet  Commonly known as:  KLONOPIN  Take 0.5 mg by mouth daily.     doxycycline 100 MG tablet  Commonly known as:  VIBRA-TABS  2 today, then 1 daily until gone     fluconazole 150 MG tablet  Commonly known as:  DIFLUCAN  Take 1 tablet (150 mg total) by mouth daily.     fluticasone 50 MCG/ACT nasal spray  Commonly known as:  FLONASE  Place 1 spray into both nostrils daily.     folic acid 1 MG tablet  Commonly known as:  FOLVITE  Take 2 mg by mouth daily.     lithium carbonate 300 MG capsule  Take 300 mg by mouth at bedtime.     methotrexate 2.5 MG tablet  Commonly known as:  RHEUMATREX  Take 6 tablets weekly     montelukast 10 MG tablet  Commonly known as:  SINGULAIR  Take 1 tablet (10 mg total) by mouth at bedtime.     montelukast 10 MG tablet  Commonly known as:  SINGULAIR  TAKE 1 TABLET DAILY     oxymetazoline 0.05 % nasal spray  Commonly known as:  AFRIN  Place 2 sprays into the nose daily.     PROAIR HFA 108 (90 Base) MCG/ACT inhaler  Generic drug:  albuterol  USE 2 INHALATIONS EVERY 4 HOURS AS NEEDED     sildenafil 100 MG tablet  Commonly known as:  VIAGRA  Take 100 mg by mouth daily as needed.     valACYclovir 1000 MG tablet  Commonly known as:  VALTREX  1 every 8 hours x 7 days     vitamin C 1000 MG tablet  Take 1,000 mg by mouth 2 (two) times daily.

## 2016-04-16 DIAGNOSIS — M0579 Rheumatoid arthritis with rheumatoid factor of multiple sites without organ or systems involvement: Secondary | ICD-10-CM | POA: Diagnosis not present

## 2016-04-16 DIAGNOSIS — F329 Major depressive disorder, single episode, unspecified: Secondary | ICD-10-CM | POA: Diagnosis not present

## 2016-04-16 DIAGNOSIS — R21 Rash and other nonspecific skin eruption: Secondary | ICD-10-CM | POA: Diagnosis not present

## 2016-04-16 DIAGNOSIS — M19241 Secondary osteoarthritis, right hand: Secondary | ICD-10-CM | POA: Diagnosis not present

## 2016-04-24 DIAGNOSIS — Z23 Encounter for immunization: Secondary | ICD-10-CM | POA: Diagnosis not present

## 2016-05-11 ENCOUNTER — Telehealth: Payer: Self-pay | Admitting: Internal Medicine

## 2016-05-11 MED ORDER — AMOXICILLIN-POT CLAVULANATE 875-125 MG PO TABS: 1.0000 | ORAL_TABLET | Freq: Two times a day (BID) | ORAL | 0 refills | Status: DC

## 2016-05-11 NOTE — Telephone Encounter (Signed)
Spoke with the pt  He is c/o increased SOB, wheezing, chest tightness, and prod cough with green sputum x 3 days  He states that he is feeling very weak  Denies f/c/s or other co's  His last ov was 03/21/15 and next ov 08/07/16  Allergies  Allergen Reactions  . Simponi [Golimumab]     Repeated infections  . Aspirin     REACTION: throat swelling  . Sulfonamide Derivatives     REACTION: oral sores  . Theophyllines    Current Outpatient Prescriptions on File Prior to Visit  Medication Sig Dispense Refill  . Ascorbic Acid (VITAMIN C) 1000 MG tablet Take 1,000 mg by mouth 2 (two) times daily.    . clonazePAM (KLONOPIN) 0.5 MG tablet Take 0.5 mg by mouth daily.     Marland Kitchen doxycycline (VIBRA-TABS) 100 MG tablet 2 today, then 1 daily until gone 8 tablet 0  . fluconazole (DIFLUCAN) 150 MG tablet Take 1 tablet (150 mg total) by mouth daily. 7 tablet 0  . fluticasone (FLONASE) 50 MCG/ACT nasal spray Place 1 spray into both nostrils daily.    . fluticasone furoate-vilanterol (BREO ELLIPTA) 100-25 MCG/INH AEPB INHALE 1 PUFF INTO THE LUNGS DAILY [id number] each 0  . folic acid (FOLVITE) 1 MG tablet Take 2 mg by mouth daily.      Marland Kitchen lithium carbonate 300 MG capsule Take 300 mg by mouth at bedtime.     . methotrexate (RHEUMATREX) 2.5 MG tablet Take 6 tablets weekly    . montelukast (SINGULAIR) 10 MG tablet Take 1 tablet (10 mg total) by mouth at bedtime. 30 tablet 11  . montelukast (SINGULAIR) 10 MG tablet TAKE 1 TABLET DAILY 90 tablet 2  . oxymetazoline (AFRIN) 0.05 % nasal spray Place 2 sprays into the nose daily.     Marland Kitchen PROAIR HFA 108 (90 Base) MCG/ACT inhaler USE 2 INHALATIONS EVERY 4 HOURS AS NEEDED 25.5 g 0  . sildenafil (VIAGRA) 100 MG tablet Take 100 mg by mouth daily as needed.      . valACYclovir (VALTREX) 1000 MG tablet 1 every 8 hours x 7 days 21 tablet 0   No current facility-administered medications on file prior to visit.

## 2016-05-11 NOTE — Telephone Encounter (Signed)
Called and spoke with pt and he is aware of CY recs and that medication has been sent to the pharmacy.  Nothing further is needed.

## 2016-05-11 NOTE — Telephone Encounter (Signed)
Suggest augmentin 875 mg, # 14, 1 twice daily  Mucinex-DM, stay well hydrated

## 2016-05-21 DIAGNOSIS — Z79899 Other long term (current) drug therapy: Secondary | ICD-10-CM | POA: Diagnosis not present

## 2016-05-25 ENCOUNTER — Telehealth: Payer: Self-pay | Admitting: Internal Medicine

## 2016-05-25 MED ORDER — AMOXICILLIN-POT CLAVULANATE 875-125 MG PO TABS: 1.0000 | ORAL_TABLET | Freq: Two times a day (BID) | ORAL | 0 refills | Status: DC

## 2016-05-25 NOTE — Telephone Encounter (Signed)
If he feels augmentin was working, but just needs longer course, then ok to refill his last script.  If it wasn't working well enough, then offer cefdinir 300 mg, # 20, 1 twice daily

## 2016-05-25 NOTE — Telephone Encounter (Signed)
Called and spoke with pt and he stated that he feels that a longer course of the augmentin would work just fine.  This has been sent to his pharmacy and nothing further is needed.

## 2016-05-25 NOTE — Telephone Encounter (Signed)
Patient is having increased coughing with yellow sputum, PND and headache x 1 day.  Denies fever, chest tightness or pain.  States he finished his Augmentin antibiotic last week - requesting an abx be called in. [facility] in [place]  Please advise Dr Annamaria Boots. Thanks.    Medication List       Accurate as of 05/25/16 10:21 AM. Always use your most recent med list.          amoxicillin-clavulanate 875-125 MG tablet Commonly known as:  AUGMENTIN Take 1 tablet by mouth 2 (two) times daily.   clonazePAM 0.5 MG tablet Commonly known as:  KLONOPIN Take 0.5 mg by mouth daily.   doxycycline 100 MG tablet Commonly known as:  VIBRA-TABS 2 today, then 1 daily until gone   fluconazole 150 MG tablet Commonly known as:  DIFLUCAN Take 1 tablet (150 mg total) by mouth daily.   fluticasone 50 MCG/ACT nasal spray Commonly known as:  FLONASE Place 1 spray into both nostrils daily.   fluticasone furoate-vilanterol 100-25 MCG/INH Aepb Commonly known as:  BREO ELLIPTA INHALE 1 PUFF INTO THE LUNGS DAILY   folic acid 1 MG tablet Commonly known as:  FOLVITE Take 2 mg by mouth daily.   lithium carbonate 300 MG capsule Take 300 mg by mouth at bedtime.   methotrexate 2.5 MG tablet Commonly known as:  RHEUMATREX Take 6 tablets weekly   montelukast 10 MG tablet Commonly known as:  SINGULAIR Take 1 tablet (10 mg total) by mouth at bedtime.   montelukast 10 MG tablet Commonly known as:  SINGULAIR TAKE 1 TABLET DAILY   oxymetazoline 0.05 % nasal spray Commonly known as:  AFRIN Place 2 sprays into the nose daily.   PROAIR HFA 108 (90 Base) MCG/ACT inhaler Generic drug:  albuterol USE 2 INHALATIONS EVERY 4 HOURS AS NEEDED   sildenafil 100 MG tablet Commonly known as:  VIAGRA Take 100 mg by mouth daily as needed.   valACYclovir 1000 MG tablet Commonly known as:  VALTREX 1 every 8 hours x 7 days   vitamin C 1000 MG tablet Take 1,000 mg by mouth 2 (two) times  daily.      Allergies  Allergen Reactions  . Simponi [Golimumab]     Repeated infections  . Aspirin     REACTION: throat swelling  . Sulfonamide Derivatives     REACTION: oral sores  . Theophyllines

## 2016-06-20 DIAGNOSIS — D2372 Other benign neoplasm of skin of left lower limb, including hip: Secondary | ICD-10-CM | POA: Diagnosis not present

## 2016-06-20 DIAGNOSIS — L820 Inflamed seborrheic keratosis: Secondary | ICD-10-CM | POA: Diagnosis not present

## 2016-06-20 DIAGNOSIS — D485 Neoplasm of uncertain behavior of skin: Secondary | ICD-10-CM | POA: Diagnosis not present

## 2016-06-22 ENCOUNTER — Telehealth: Payer: Self-pay | Admitting: Internal Medicine

## 2016-06-22 MED ORDER — FLUTICASONE PROPIONATE 50 MCG/ACT NA SUSP: 1.0000 | Freq: Every day | NASAL | 11 refills | Status: DC

## 2016-06-22 NOTE — Telephone Encounter (Signed)
Ok to refill Flonase nasal spray   1 puff each nostril once daily   Refill x 12

## 2016-06-22 NOTE — Telephone Encounter (Signed)
Called spoke with pt. He is wanting to see if Dr. Annamaria Boots would call in Rx for his flonase. He uses 1 spray each nostil at bedtime. Dr. Noreene Filbert office is not wanting to refill this. [facility] [place].  Please advise Dr. Annamaria Boots thanks

## 2016-06-22 NOTE — Telephone Encounter (Signed)
Rx sent. Pt notified.  Nothing further needed.  

## 2016-07-30 ENCOUNTER — Other Ambulatory Visit: Payer: Self-pay | Admitting: Internal Medicine

## 2016-08-07 ENCOUNTER — Telehealth: Payer: Self-pay | Admitting: Internal Medicine

## 2016-08-07 ENCOUNTER — Ambulatory Visit (INDEPENDENT_AMBULATORY_CARE_PROVIDER_SITE_OTHER): Payer: Medicare Other | Admitting: Internal Medicine

## 2016-08-07 ENCOUNTER — Ambulatory Visit (INDEPENDENT_AMBULATORY_CARE_PROVIDER_SITE_OTHER)
Admission: RE | Admit: 2016-08-07 | Discharge: 2016-08-07 | Disposition: A | Discharge status: Home / Self Care | Payer: Medicare Other | Source: Ambulatory Visit | Attending: Internal Medicine | Admitting: Internal Medicine

## 2016-08-07 ENCOUNTER — Encounter: Payer: Self-pay | Admitting: Internal Medicine

## 2016-08-07 VITALS — BP 112/68 | HR 71 | Ht 68.0 in | Wt 185.0 lb

## 2016-08-07 DIAGNOSIS — J45998 Other asthma: Secondary | ICD-10-CM | POA: Diagnosis not present

## 2016-08-07 DIAGNOSIS — R05 Cough: Secondary | ICD-10-CM | POA: Diagnosis not present

## 2016-08-07 DIAGNOSIS — J45909 Unspecified asthma, uncomplicated: Secondary | ICD-10-CM

## 2016-08-07 LAB — NITRIC OXIDE: Nitric Oxide: 25

## 2016-08-07 MED ORDER — FLUTICASONE FUROATE-VILANTEROL 200-25 MCG/INH IN AEPB: 1.0000 | INHALATION_SPRAY | Freq: Every day | RESPIRATORY_TRACT | 12 refills | Status: DC

## 2016-08-07 MED ORDER — AMOXICILLIN-POT CLAVULANATE 875-125 MG PO TABS: 1.0000 | ORAL_TABLET | Freq: Two times a day (BID) | ORAL | 0 refills | Status: DC

## 2016-08-07 NOTE — Progress Notes (Signed)
M former smoker followed for asthma, allergic rhinitis, hx rhinosinusitis, nasal polyps complicated by rheumatoid arthritis. L  03/10/15- 63 yoM former smoker followed for asthma, allergic rhinitis, hx rhinosinusitis, nasal polyps, complicated by rheumatoid arthritis/ MTX/ Humira, bipolar Acute bronchitis- office called in augmentin 03/07/15. Increased chest tightness and wheeze 2 weeks began as an acute bronchitis and sinusitis. He thinks he woke up suddenly with it. We discussed the possibility that he refluxed. Sputum initially was green and Augmentin helped but after ending that he had another similar exacerbation today. Using his rescue inhaler up to 5 times daily.  03/21/15- 63 yoM former smoker followed for asthma, allergic rhinitis, hx rhinosinusitis, nasal polyps, complicated by rheumatoid arthritis/ MTX/ Humira, bipolar Pt. stated that the Anoro did not help; once he was finished with it he was feeling better. Prod. cough clear mucus. little wheezing and SOB . Pt. would  would like samples of Singulair. Productive cough ended suddenly, consistent with resolution of an acute bronchitis syndrome. He feels almost clear.  08/07/2016-65 year old male former smoker followed for asthma, allergic rhinitis, history rhinosinusitis, nasal polyps, complicated by rheumatoid arthritis/MTX/Humira, bipolar FOLLOWS FOR:Pt states [place] is still coughing [place]; has had several URI since last visit. [place] where he says most of the things he likes to do are dusty. He wears mask and goggles for this. Augmentin had helped clear cough and nasal congestion but they come back. Cough productive of sometimes green mucus, also from nose but denies headache or fever. FENO- 08/07/16- 25 Office spirometry 08/07/16- WNL, FEV1 3.59/ 110%, R 0.81  ROS-see HPI Constitutional:   No-   weight loss, night sweats, fevers, chills, fatigue, lassitude. HEENT:   No-  headaches, difficulty  swallowing, tooth/dental problems, sore throat,       No-  sneezing, itching, ear ache, +nasal congestion, post nasal drip,  CV:  No-   chest pain, orthopnea, PND, swelling in lower extremities, anasarca, dizziness, palpitations Resp: No-   shortness of breath with exertion or at rest.              +  productive cough,   non-productive cough,  No- coughing up of blood.              +   change in color of mucus.   +Some wheezing.   Skin: No rash GI:  +heartburn, indigestion, abdominal pain, nausea, vomiting, GU:  MS: +  joint pain or swelling.   Neuro-     nothing unusual Psych:  No- change in mood or affect. No depression or anxiety.  No memory loss.  OBJ General- Alert, Oriented, Affect-appropriate, Distress- none acute Skin-  Lymphadenopathy- none Head- atraumatic            Eyes- Gross vision intact, PERRLA, conjunctivae clear secretions            Ears- Hearing, canals-normal            Nose- Clear, no-Septal dev, mucus, polyps, erosion, perforation             Throat- Mallampati III , mucosa clear , drainage- none, tonsils- atrophic, + raspy voice Neck- flexible , trachea midline, no stridor , thyroid nl, carotid no bruit Chest - symmetrical excursion , unlabored           Heart/CV- RRR , no murmur , no gallop  , no rub, nl s1 s2                           -  JVD- none , edema- none, stasis changes- none, varices- none           Lung- clear to P&A, wheeze- none, no- cough , dullness-none, rub- none           Chest wall-  Abd-  Br/ Gen/ Rectal- Not done, not indicated Extrem- cyanosis- none, clubbing, none, atrophy- none, strength- nl.                          +Synovial thickening of hand joints. Neuro- grossly intact to observation

## 2016-08-07 NOTE — Patient Instructions (Signed)
Script sent for augmentin x 3 weeks  Sample and script increasing Breo to 200-   Continue inhaling 1 puff then rinse mouth well, once daily  Order- FENO     Dx asthmatic bronchitis  Order- Office spirometry  Order- CXR  Asthmatic bronchitis moderate persistent

## 2016-08-07 NOTE — Assessment & Plan Note (Signed)
Suspect chronic sinusitis and chronic bronchitis. Plan-FENO, Spirometry , CXR, increase Breo to 200. Augmentin x 3 weeks

## 2016-08-07 NOTE — Assessment & Plan Note (Signed)
No acute symptoms but intermittent green nasal discharge and postnasal drip suggest ongoing chronic sinusitis for which a longer course of antibiotics may be useful. Plan-Augmentin 3 weeks

## 2016-08-07 NOTE — Addendum Note (Signed)
Addended by: Clayborne Dana C on: 08/07/2016 11:11 AM   Modules accepted: Orders

## 2016-08-07 NOTE — Telephone Encounter (Signed)
Rx has been sent to [name] for Aug[facility]in. Pt is aware and nothing more needed at this time.

## 2016-08-24 ENCOUNTER — Other Ambulatory Visit: Payer: Self-pay | Admitting: *Deleted

## 2016-08-24 ENCOUNTER — Telehealth: Payer: Self-pay | Admitting: Rheumatology

## 2016-08-24 ENCOUNTER — Other Ambulatory Visit: Payer: Self-pay | Admitting: Rheumatology

## 2016-08-24 DIAGNOSIS — Z79899 Other long term (current) drug therapy: Secondary | ICD-10-CM

## 2016-08-24 LAB — CBC WITH DIFFERENTIAL/PLATELET
BASOS ABS: 0 {cells}/uL (ref 0–200)
BASOS PCT: 0 %
EOS ABS: 142 {cells}/uL (ref 15–500)
EOS PCT: 2 %
HCT: 44.4 % (ref 38.5–50.0)
HEMOGLOBIN: 14.4 g/dL (ref 13.2–17.1)
LYMPHS ABS: 2343 {cells}/uL (ref 850–3900)
Lymphocytes Relative: 33 %
MCH: 32 pg (ref 27.0–33.0)
MCHC: 32.4 g/dL (ref 32.0–36.0)
MCV: 98.7 fL (ref 80.0–100.0)
MPV: 10.2 fL (ref 7.5–12.5)
Monocytes Absolute: 639 cells/uL (ref 200–950)
Monocytes Relative: 9 %
NEUTROS ABS: 3976 {cells}/uL (ref 1500–7800)
Neutrophils Relative %: 56 %
Platelets: 302 10*3/uL (ref 140–400)
RBC: 4.5 MIL/uL (ref 4.20–5.80)
RDW: 13.5 % (ref 11.0–15.0)
WBC: 7.1 10*3/uL (ref 3.8–10.8)

## 2016-08-24 LAB — COMPLETE METABOLIC PANEL WITH GFR
ALBUMIN: 4.3 g/dL (ref 3.6–5.1)
ALK PHOS: 51 U/L (ref 40–115)
ALT: 23 U/L (ref 9–46)
AST: 24 U/L (ref 10–35)
BILIRUBIN TOTAL: 0.6 mg/dL (ref 0.2–1.2)
BUN: 14 mg/dL (ref 7–25)
CO2: 28 mmol/L (ref 20–31)
CREATININE: 0.92 mg/dL (ref 0.70–1.25)
Calcium: 9.9 mg/dL (ref 8.6–10.3)
Chloride: 103 mmol/L (ref 98–110)
GFR, EST NON AFRICAN AMERICAN: 88 mL/min (ref >=60)
GFR, Est African American: >89 mL/min (ref >=60)
GLUCOSE: 89 mg/dL (ref 65–99)
Potassium: 3.9 mmol/L (ref 3.5–5.3)
SODIUM: 141 mmol/L (ref 135–146)
TOTAL PROTEIN: 6.8 g/dL (ref 6.1–8.1)

## 2016-08-24 NOTE — Telephone Encounter (Signed)
Patient wants to know which insurance company has the best coverage for Humira

## 2016-08-24 NOTE — Telephone Encounter (Signed)
Last Visit: 07/17 Next Visit: 09/17/16 Labs: 05/22/16 WNL TB Gold: 02/2015   Patient coming to office today to update labs.   Okay to refill Humira?

## 2016-08-24 NOTE — Telephone Encounter (Signed)
OK 

## 2016-08-24 NOTE — Telephone Encounter (Signed)
Please advise 

## 2016-08-24 NOTE — Telephone Encounter (Signed)
Called patient to discuss.  Left a message for him to return my phone call.

## 2016-08-27 LAB — QUANTIFERON TB GOLD ASSAY (BLOOD)
Interferon Gamma Release Assay: NEGATIVE
Mitogen-Nil: 7.79 IU/mL
QUANTIFERON NIL VALUE: 0.04 [IU]/mL
Quantiferon Tb Ag Minus Nil Value: 0.01 IU/mL

## 2016-08-27 NOTE — Progress Notes (Signed)
#  1: CMP with GFR is normal#2: CBC with differential is normal#3: Toe patient labs are normal and no change in treatment#4: Sent labs to PCP

## 2016-08-27 NOTE — Progress Notes (Signed)
#  1: TB gold is negative#2 send copy of labs to patient is PCP.

## 2016-08-28 ENCOUNTER — Telehealth: Payer: Self-pay | Admitting: Radiology

## 2016-08-28 NOTE — Telephone Encounter (Signed)
-----   Message from Eliezer Lofts, [place] sent at 08/27/2016  6:09 PM EST ----- #1: TB gold is negative#2 send copy of labs to patient is PCP.

## 2016-08-28 NOTE — Telephone Encounter (Signed)
Second call attempt.  Left message for patient to call me back.

## 2016-08-28 NOTE — Telephone Encounter (Signed)
I have called patient to advise TB gold is normal

## 2016-08-29 NOTE — Telephone Encounter (Signed)
Called patient.  He reports that he will be changing to Medicare in November and he was wondering which plan would be best for coverage of Humira.  I advised patient to call the [facility]) and provided him with their phone number [phone]).  Patient voiced understanding and denied any further medication related questions.    Elisabeth Most, Pharm.D., BCPS Clinical Pharmacist Pager: [phone] Phone: [phone] 08/29/2016 8:48 AM

## 2016-09-06 ENCOUNTER — Telehealth: Payer: Self-pay | Admitting: Internal Medicine

## 2016-09-06 NOTE — Telephone Encounter (Signed)
Spoke with Terrence Dupont with [employer], who states pt is currently trying to get his proair Rx transferred from mail order. I have given Terrence Dupont a verbal okay as pt is currently on proair prn. Nothing further needed.

## 2016-09-13 NOTE — Progress Notes (Signed)
Office Visit Note  Patient: [name] [name]             Date of Birth: 25-May-1951           MRN: [id number]             PCP: Wenda Low, MD Referring: Dorian Heckle, MD Visit Date: 09/17/2016 Occupation: @GUAROCC @    Subjective:  No chief complaint on file. Follow-up on seronegative rheumatoid arthritis and high risk prescription and osteoarthritis  History of Present Illness: [name] [name] is a 66 y.o. male  Last seen 04/16/2016 Patient is doing well with his Humira at every 2 weeks Is on methotrexate 4 per week. And folic acid 1 per day. Is having some pain to his hands at bilateral third MCP joint.  Patient is exercising at [employer]. Is requesting [place] to do a medical release for him but I explained to the patient that the family physician we'll have to do so since it's involving the cardiac and respiratory system. From our point of view we advocate for physical exercise but we would not be able to sign the form because the other systems will need to be addressed by the PCP. So it best to have them do it totally.  Note that he was off of the Humira for the shingles vaccine and during this. He did have joint pain and discomfort while being off of Humira. When he restarted the Humira is joint pain symptoms resolved fully. He became more aware that he has active disease that needs to be well controlled by taking the Humira regularly. When on Humira he does very well.    Activities of Daily Living:  Patient reports morning stiffness for 30 minutes.   Patient Denies nocturnal pain.  Difficulty dressing/grooming: Denies Difficulty climbing stairs: Denies Difficulty getting out of chair: Denies Difficulty using hands for taps, buttons, cutlery, and/or writing: Reports (pain to bilateral third MCP joint with mild swelling)   Review of Systems  Constitutional: Negative for fatigue.  HENT: Negative for mouth sores and mouth dryness.   Eyes: Negative for dryness.    Respiratory: Negative for shortness of breath.   Gastrointestinal: Negative for constipation and diarrhea.  Musculoskeletal: Negative for myalgias and myalgias.  Skin: Negative for sensitivity to sunlight.  Neurological: Negative for memory loss.  Psychiatric/Behavioral: Negative for sleep disturbance.    PMFS History:  Patient Active Problem List   Diagnosis Date Noted  . Herpes zoster 08/01/2013  . Rheumatoid arthritis of multiple sites without rheumatoid factor ([place]) 09/18/2010  . NASAL POLYP 12/24/2007  . Sinusitis, chronic 12/24/2007  . Seasonal and perennial allergic rhinitis 12/24/2007  . Allergic-infective asthma 12/24/2007  . G E R D 12/24/2007    Past Medical History:  Diagnosis Date  . ALLERGIC RHINITIS   . Asthma   . GERD (gastroesophageal reflux disease)   . Rheumatoid arthritis(714.0)     Family History  Problem Relation Age of Onset  . Multiple sclerosis Father   . Breast cancer Mother    Past Surgical History:  Procedure Laterality Date  . extensive sinus surgery with ablation of the frontal sinuses    . HERNIA REPAIR    . nasal polypectomies     Social History   Social History Narrative  . No narrative on file     Objective: Vital Signs: BP 134/70 (BP Location: Left Arm, Patient Position: Sitting, Cuff Size: Large)   Pulse (!) 59   Resp 14   Ht 5' 8"  (  1.727 m)   Wt 189 lb (85.7 kg)   BMI 28.74 kg/m    Physical Exam  Constitutional: He is oriented to person, place, and time. He appears well-developed and well-nourished.  HENT:  Head: Normocephalic and atraumatic.  Eyes: Conjunctivae and EOM are normal. Pupils are equal, round, and reactive to light.  Neck: Normal range of motion. Neck supple.  Cardiovascular: Normal rate, regular rhythm and normal heart sounds.  Exam reveals no gallop and no friction rub.   No murmur heard. Pulmonary/Chest: Effort normal and breath sounds normal. No respiratory distress. He has no wheezes. He has no  rales. He exhibits no tenderness.  Abdominal: Soft. He exhibits no distension and no mass. There is no tenderness. There is no guarding.  Musculoskeletal: Normal range of motion.  Lymphadenopathy:    He has no cervical adenopathy.  Neurological: He is alert and oriented to person, place, and time. He exhibits normal muscle tone. Coordination normal.  Skin: Skin is warm and dry. Capillary refill takes less than 2 seconds. No rash noted.  Psychiatric: He has a normal mood and affect. His behavior is normal. Judgment and thought content normal.  Vitals reviewed.    Musculoskeletal Exam:  Full range of motion of all joints Grip strength is equal and strong bilaterally Note that patient has mild ulnar deviation at the MCP bilaterally Fiber myalgia tender points are all absent  CDAI Exam: CDAI Homunculus Exam:   Tenderness:  RUE: wrist LUE: wrist Right hand: 3rd MCP Left hand: 3rd MCP  Swelling:  Right hand: 3rd MCP Left hand: 3rd MCP  Joint Counts:  CDAI Tender Joint count: 4 CDAI Swollen Joint count: 2  Global Assessments:  Patient Global Assessment: 6 Provider Global Assessment: 6  CDAI Calculated Score: 18  Patient has pain to the third MCP bilaterally. He rates his discomfort from rheumatoid arthritis as a 6 on a scale of 0-10 when he is exercising. As a result of the above pain to third MCP joint pain on   advised the patient to try 5 tablets of methotrexate per week to see if the higher dose addresses his tenderness and moderate swelling to the third bilateral MCP.  Investigation: No additional findings.  Orders Only on 08/24/2016  Component Date Value Ref Range Status  . WBC 08/24/2016 7.1  3.8 - 10.8 K/uL Final  . RBC 08/24/2016 4.50  4.20 - 5.80 MIL/uL Final  . Hemoglobin 08/24/2016 14.4  13.2 - 17.1 g/dL Final  . HCT 08/24/2016 44.4  38.5 - 50.0 % Final  . MCV 08/24/2016 98.7  80.0 - 100.0 fL Final  . MCH 08/24/2016 32.0  27.0 - 33.0 pg Final  . MCHC  08/24/2016 32.4  32.0 - 36.0 g/dL Final  . RDW 08/24/2016 13.5  11.0 - 15.0 % Final  . Platelets 08/24/2016 302  140 - 400 K/uL Final  . MPV 08/24/2016 10.2  7.5 - 12.5 fL Final  . Neutro Abs 08/24/2016 3976  1,500 - 7,800 cells/uL Final  . Lymphs Abs 08/24/2016 2343  850 - 3,900 cells/uL Final  . Monocytes Absolute 08/24/2016 639  200 - 950 cells/uL Final  . Eosinophils Absolute 08/24/2016 142  15 - 500 cells/uL Final  . Basophils Absolute 08/24/2016 0  0 - 200 cells/uL Final  . Neutrophils Relative % 08/24/2016 56  % Final  . Lymphocytes Relative 08/24/2016 33  % Final  . Monocytes Relative 08/24/2016 9  % Final  . Eosinophils Relative 08/24/2016 2  % Final  .  Basophils Relative 08/24/2016 0  % Final  . Smear Review 08/24/2016 Criteria for review not met   Final  . Sodium 08/24/2016 141  135 - 146 mmol/L Final  . Potassium 08/24/2016 3.9  3.5 - 5.3 mmol/L Final  . Chloride 08/24/2016 103  98 - 110 mmol/L Final  . CO2 08/24/2016 28  20 - 31 mmol/L Final  . Glucose, Bld 08/24/2016 89  65 - 99 mg/dL Final  . BUN 08/24/2016 14  7 - 25 mg/dL Final  . Creat 08/24/2016 0.92  0.70 - 1.25 mg/dL Final   Comment:   For patients > or = 64 years of age: The upper reference limit for Creatinine is approximately 13% higher for people identified as African-American.     . Total Bilirubin 08/24/2016 0.6  0.2 - 1.2 mg/dL Final  . Alkaline Phosphatase 08/24/2016 51  40 - 115 U/L Final  . AST 08/24/2016 24  10 - 35 U/L Final  . ALT 08/24/2016 23  9 - 46 U/L Final  . Total Protein 08/24/2016 6.8  6.1 - 8.1 g/dL Final  . Albumin 08/24/2016 4.3  3.6 - 5.1 g/dL Final  . Calcium 08/24/2016 9.9  8.6 - 10.3 mg/dL Final  . GFR, Est African American 08/24/2016 >89  >=60 mL/min Final  . GFR, Est Non African American 08/24/2016 88  >=60 mL/min Final  Lab on 08/24/2016  Component Date Value Ref Range Status  . Interferon Gamma Release Assay 08/27/2016 NEGATIVE  NEGATIVE Final  . Quantiferon Nil Value  08/27/2016 0.04  IU/mL Final  . Mitogen-Nil 08/27/2016 7.79  IU/mL Final  . Quantiferon Tb Ag Minus Nil Value 08/27/2016 0.01  IU/mL Final   Comment:   The Nil tube value is used to determine if the patient has a preexisting immune response which could cause a false-positive reading on the test. In order for a test to be valid, the Nil tube must have a value of less than or equal to 8.0 IU/mL.   The mitogen control tube is used to assure the patient has a healthy immune status and also serves as a control for correct blood handling and incubation. It is used to detect false-negative readings. The mitogen tube must have a gamma interferon value of greater than or equal to 0.5 IU/mL higher than the value of the Nil tube.   The TB antigen tube is coated with the M. tuberculosis specific antigens. For a test to be considered positive, the TB antigen tube value minus the Nil tube value must be greater than or equal to 0.35 IU/mL.   For additional information, please refer to http://education.questdiagnostics.com/faq/QFT (This link is being provided for informational/educational purposes only.)   Office Visit on 08/07/2016  Component Date Value Ref Range Status  . Nitric Oxide 08/07/2016 25   Final   Imaging: No results found.  Speciality Comments: No specialty comments available.    Procedures:  No procedures performed Allergies: Simponi [golimumab]; Aspirin; Sulfonamide derivatives; and Theophyllines   Assessment / Plan:     Visit Diagnoses: Rheumatoid arthritis of multiple sites without rheumatoid factor (HCC)  Primary osteoarthritis of right hand  High risk medications (not anticoagulants) long-term use - humira every 2 weekswill start MTX at 5/weekwill start Folic Acid at 2 / day  Primary osteoarthritis of both hands   Patient has pain to the third MCP bilaterally. He rates his discomfort from rheumatoid arthritis as a 6 on a scale of 0-10 when he is exercising. As a  result of the above pain to third MCP joint pain on   advised the patient to try 5 tablets of methotrexate per week to see if the higher dose addresses his tenderness and moderate swelling to the third bilateral MCP.  Patient also was concerned that he will be soon be on Medicare and his commercial insurance will stop. He is concerned whether he can afford to Humira under Medicare. We discussed that with him the different options that are available including supplemental plans that would cover Biologics.  Patient is doing his due diligence now to be sure that he picks the right plan that would cover his Humira.  I've written a prescription for methotrexate 5 per week and folic acid 2 per day since having increased at this time. I'm hopeful that by the next visit in 5 months his pain of 6 will be described at a lower level as a result of this methotrexate change  He will also continue doing physical exercise at [employer] and increasing the strength of his muscles so that his joints have less wear and tear overall.  Patient's TB goal is negative in fall of 2017  CBC with differential CMP with GFR every 3 months starting February 2018  Patient wants [place] to give him a medical release to exercise but I've advised him that it would be better for him to get that signed by his PCP. Rheumatologically, I would encourage him to do the exercises at [place] however his family physician would need to evaluate whether his cardiac and pulmonary systems are adequate for the medical release. This will be best signed by the PCP CPE.    Orders: No orders of the defined types were placed in this encounter.  Meds ordered this encounter  Medications  . methotrexate (RHEUMATREX) 2.5 MG tablet    Sig: Take 5 tablets (12.5 mg total) by mouth once a week. Take 5 tablets weekly    Dispense:  60 tablet    Refill:  0    Order Specific Question:   Supervising Provider    Answer:   Bo Merino [[id number]]  .  folic acid (FOLVITE) 1 MG tablet    Sig: Take 2 tablets (2 mg total) by mouth daily.    Dispense:  180 tablet    Refill:  4    Order Specific Question:   Supervising Provider    Answer:   Bo Merino [phone]    Face-to-face time spent with patient was 40 minutes. 50% of time was spent in counseling and coordination of care.  Follow-Up Instructions: Return in about 5 months (around 02/15/2017) for RA,humira q 2 wks, mtx incr to 5/week, folic incr to 2/day; oa hands;.  When patient returns we will see if the patient is doing better at his MCP joints/RA with the 5 pills of methotrexate versus 4 pills that he was on in December 2017 visit. Note that he's been having ongoing pain but he's been "tolerating it". I advised the patient that if RA is not well-controlled it can continue to damage his joints and so we don't want to "tolerate pain if it's coming from uncontrolled RA. He knows that osteoarthritis can also be a source of pain and will work hard to differentiate RA pain from OA pain.   Eliezer Lofts, PA-C

## 2016-09-17 ENCOUNTER — Encounter: Payer: Self-pay | Admitting: Rheumatology

## 2016-09-17 ENCOUNTER — Ambulatory Visit (INDEPENDENT_AMBULATORY_CARE_PROVIDER_SITE_OTHER): Payer: Medicare Other | Admitting: Rheumatology

## 2016-09-17 VITALS — BP 134/70 | HR 59 | Resp 14 | Ht 68.0 in | Wt 189.0 lb

## 2016-09-17 DIAGNOSIS — Z79899 Other long term (current) drug therapy: Secondary | ICD-10-CM | POA: Diagnosis not present

## 2016-09-17 DIAGNOSIS — M19041 Primary osteoarthritis, right hand: Secondary | ICD-10-CM | POA: Diagnosis not present

## 2016-09-17 DIAGNOSIS — M19042 Primary osteoarthritis, left hand: Secondary | ICD-10-CM | POA: Diagnosis not present

## 2016-09-17 DIAGNOSIS — M0609 Rheumatoid arthritis without rheumatoid factor, multiple sites: Secondary | ICD-10-CM

## 2016-09-17 MED ORDER — METHOTREXATE 2.5 MG PO TABS: 12.5000 mg | ORAL_TABLET | ORAL | 0 refills | Status: DC

## 2016-09-17 MED ORDER — FOLIC ACID 1 MG PO TABS: 2.0000 mg | ORAL_TABLET | Freq: Every day | ORAL | 4 refills | Status: AC

## 2016-09-17 NOTE — Progress Notes (Signed)
Rheumatology Medication Review by a Pharmacist Does the patient feel that his/her medications are working for him/her?  Yes Has the patient been experiencing any side effects to the medications prescribed?  No Does the patient have any problems obtaining medications?  No, patient currently denies issues with access to medications.  He does report some concern over access to Humira once he turns 66 and switches to medicare part D insurance.    Issues to address at subsequent visits: Access to Humira with medicare.     Pharmacist comments:  [name] [name] is a pleasant 65 yo M who presents to clinic for follow up of his rheumatoid arthritis.  Patient had most recent standing labs on 08/24/16 which were normal and had his annual TB Gold at that time which was negative.  Patient had questions regarding medicare insurance and Humira.  He states he will be switching to medicare in November 2018.  I advised patient to contact the [employer] and ask which medicare plan would be best for coverage of Humira.  I also reviewed patient assistance foundations that help with copays for medicare patients and the pharmaceutical assistance program for Humira through [place].  I gave patient information about multiple patient assistance foundations (i.e. [employer], [employer], [employer], [place]) and gave him information on the patient assistance program for Humira.  Patient denied any other medication related questions at this time.   Elisabeth Most, Pharm.D., BCPS Clinical Pharmacist Pager: [phone] Phone: [phone] 09/17/2016 10:26 AM

## 2016-09-24 ENCOUNTER — Telehealth: Payer: Self-pay | Admitting: Internal Medicine

## 2016-09-24 MED ORDER — AMOXICILLIN-POT CLAVULANATE 875-125 MG PO TABS: 1.0000 | ORAL_TABLET | Freq: Two times a day (BID) | ORAL | 1 refills | Status: DC

## 2016-09-24 NOTE — Telephone Encounter (Signed)
Pt c/o increased weakness, fatigue, chest congestion, sinus congestion, runny nose X3-4 days.   Pt is not taking anything aside from maintenance meds to help with symptoms.  Pt uses [facility] on [address].  Last ov: 08/07/16 Next ov: none-recall for 07/2017 with CY  CY please advise on recs.  Thanks!   Allergies  Allergen Reactions  . Simponi [Golimumab]     Repeated infections  . Aspirin     REACTION: throat swelling  . Sulfonamide Derivatives     REACTION: oral sores  . Theophyllines    Current Outpatient Prescriptions on File Prior to Visit  Medication Sig Dispense Refill  . amoxicillin-clavulanate (AUGMENTIN) 875-125 MG tablet Take 1 tablet by mouth 2 (two) times daily. 42 tablet 0  . Ascorbic Acid (VITAMIN C) 1000 MG tablet Take 1,000 mg by mouth 2 (two) times daily.    . clonazePAM (KLONOPIN) 0.5 MG tablet Take 0.5 mg by mouth daily.     Marland Kitchen doxycycline (VIBRA-TABS) 100 MG tablet 2 today, then 1 daily until gone 8 tablet 0  . fluconazole (DIFLUCAN) 150 MG tablet Take 1 tablet (150 mg total) by mouth daily. 7 tablet 0  . fluticasone (FLONASE) 50 MCG/ACT nasal spray Place 1 spray into both nostrils daily. 16 g 11  . fluticasone furoate-vilanterol (BREO ELLIPTA) 200-25 MCG/INH AEPB Inhale 1 puff into the lungs daily. 1 each 12  . folic acid (FOLVITE) 1 MG tablet Take 2 tablets (2 mg total) by mouth daily. 180 tablet 4  . HUMIRA PEN 40 MG/0.8ML PNKT INJECT 1 PEN UNDER THE SKIN EVERY OTHER WEEK 6 each 0  . lithium carbonate 300 MG capsule Take 300 mg by mouth at bedtime.     . methotrexate (RHEUMATREX) 2.5 MG tablet Take 5 tablets (12.5 mg total) by mouth once a week. Take 5 tablets weekly 60 tablet 0  . montelukast (SINGULAIR) 10 MG tablet TAKE 1 TABLET DAILY 90 tablet 2  . oxymetazoline (AFRIN) 0.05 % nasal spray Place 2 sprays into the nose daily.     Marland Kitchen PROAIR HFA 108 (90 Base) MCG/ACT inhaler USE 2 INHALATIONS EVERY 4 HOURS AS NEEDED 25.5 g 0  . sildenafil  (VIAGRA) 100 MG tablet Take 100 mg by mouth daily as needed.      . valACYclovir (VALTREX) 1000 MG tablet 1 every 8 hours x 7 days 21 tablet 0   No current facility-administered medications on file prior to visit.

## 2016-09-24 NOTE — Telephone Encounter (Signed)
Offer augmentin 875 mg, # 14, 1 twice daily, ref x 1 

## 2016-09-24 NOTE — Telephone Encounter (Signed)
Spoke with pt. He is aware of CY's recommendation. Rx has been sent in. Nothing further was needed. 

## 2016-09-27 DIAGNOSIS — J45909 Unspecified asthma, uncomplicated: Secondary | ICD-10-CM | POA: Diagnosis not present

## 2016-09-27 DIAGNOSIS — F39 Unspecified mood [affective] disorder: Secondary | ICD-10-CM | POA: Diagnosis not present

## 2016-09-27 DIAGNOSIS — I10 Essential (primary) hypertension: Secondary | ICD-10-CM | POA: Diagnosis not present

## 2016-09-27 DIAGNOSIS — R0981 Nasal congestion: Secondary | ICD-10-CM | POA: Diagnosis not present

## 2016-09-27 DIAGNOSIS — M069 Rheumatoid arthritis, unspecified: Secondary | ICD-10-CM | POA: Diagnosis not present

## 2016-10-06 ENCOUNTER — Other Ambulatory Visit: Payer: Self-pay | Admitting: Internal Medicine

## 2016-10-09 ENCOUNTER — Telehealth: Payer: Self-pay

## 2016-10-09 NOTE — Telephone Encounter (Signed)
Spoke with patient about enrolling in to the [place] for [place]. I explained that funds were ava liable to help with his copay. He was not interested in this time as he pays $5.00 currently each month with his insurance. He would like to revisit the idea near november 2018 when he will change insurance to medicare.  Laconya Clere, [place], CPhT

## 2016-10-13 ENCOUNTER — Telehealth: Payer: Self-pay | Admitting: Internal Medicine

## 2016-10-13 MED ORDER — AMOXICILLIN-POT CLAVULANATE 875-125 MG PO TABS: 1.0000 | ORAL_TABLET | Freq: Two times a day (BID) | ORAL | 0 refills | Status: AC

## 2016-10-13 NOTE — Telephone Encounter (Signed)
Better on augmentin, much worse since stopped with mostly nasal symptoms and f/u with ent planned for coming week  rec augementin refill x 1 > f/u ent as planned

## 2016-10-15 ENCOUNTER — Encounter: Payer: Self-pay | Admitting: Internal Medicine

## 2016-10-15 ENCOUNTER — Ambulatory Visit (INDEPENDENT_AMBULATORY_CARE_PROVIDER_SITE_OTHER): Payer: Medicare Other | Admitting: Internal Medicine

## 2016-10-15 ENCOUNTER — Telehealth: Payer: Self-pay | Admitting: Internal Medicine

## 2016-10-15 ENCOUNTER — Ambulatory Visit (INDEPENDENT_AMBULATORY_CARE_PROVIDER_SITE_OTHER)
Admission: RE | Admit: 2016-10-15 | Discharge: 2016-10-15 | Disposition: A | Discharge status: Home / Self Care | Payer: Medicare Other | Source: Ambulatory Visit | Attending: Internal Medicine | Admitting: Internal Medicine

## 2016-10-15 VITALS — BP 118/72 | HR 77 | Ht 68.0 in | Wt 185.4 lb

## 2016-10-15 DIAGNOSIS — J209 Acute bronchitis, unspecified: Secondary | ICD-10-CM

## 2016-10-15 DIAGNOSIS — R0602 Shortness of breath: Secondary | ICD-10-CM | POA: Diagnosis not present

## 2016-10-15 DIAGNOSIS — J4541 Moderate persistent asthma with (acute) exacerbation: Secondary | ICD-10-CM

## 2016-10-15 DIAGNOSIS — J42 Unspecified chronic bronchitis: Secondary | ICD-10-CM

## 2016-10-15 DIAGNOSIS — J45901 Unspecified asthma with (acute) exacerbation: Secondary | ICD-10-CM | POA: Insufficient documentation

## 2016-10-15 DIAGNOSIS — R05 Cough: Secondary | ICD-10-CM | POA: Diagnosis not present

## 2016-10-15 DIAGNOSIS — J324 Chronic pansinusitis: Secondary | ICD-10-CM

## 2016-10-15 HISTORY — DX: Unspecified asthma with (acute) exacerbation: J45.901

## 2016-10-15 MED ORDER — LEVALBUTEROL HCL 0.63 MG/3ML IN NEBU
0.6300 mg | INHALATION_SOLUTION | Freq: Once | RESPIRATORY_TRACT | Status: AC
  Administered 2016-10-15: 0.63 mg via RESPIRATORY_TRACT

## 2016-10-15 MED ORDER — DOXYCYCLINE HYCLATE 100 MG PO TABS: 100.0000 mg | ORAL_TABLET | Freq: Two times a day (BID) | ORAL | 0 refills | Status: DC

## 2016-10-15 MED ORDER — PROMETHAZINE-CODEINE 6.25-10 MG/5ML PO SYRP: 5.0000 mL | ORAL_SOLUTION | Freq: Four times a day (QID) | ORAL | 0 refills | Status: DC | PRN

## 2016-10-15 NOTE — Assessment & Plan Note (Signed)
Cough quality suggests bronchomalacia. We have discussed the possibility of long-term maintenance antibiotic coverage since he says he gets worse every time he comes off antibiotics. Immunosuppressant medications are complicating. Plan-nebulizer treatments Xopenex. Add doxycycline for 10 days. He will call me near the end of his Augmentin to discuss extending antibiotic coverage for up to 4 weeks. CXR

## 2016-10-15 NOTE — Patient Instructions (Signed)
Neb xop 0.63     Dx     Exacerbation chronic bronchitis  Order- CXR      Keep the appointment with Dr Erik Obey about the sinus discomfort.  Tell him about the double vision also  Script sent for doxycycline to add to your augmentin for broader coverage        Call me as you get toward the end of the augmentin to let me know how you are doing. We will consider an extended antibiotic course at that time.  Script for cough syrup printed  Stay well-hydrated and avoid getting chilled

## 2016-10-15 NOTE — Telephone Encounter (Signed)
Spoke with pt, who c/o prod cough yellow and bloody mucus, wheezing, headache & fever of 100.4. Pt has been scheduled for acute visit with CY for 10/15/16 @ 4:15. Pt voiced understanding, nothing further needed.

## 2016-10-15 NOTE — Progress Notes (Signed)
HPI M former smoker followed for asthma, allergic rhinitis, hx rhinosinusitis, nasal polyps complicated by rheumatoid arthritis. FENO- 08/07/16- 25 Office spirometry 08/07/16- WNL, FEV1 3.59/ 110%, R 0.81 ---------------------------------------------------------------   08/07/2016-66 year old male former smoker followed for asthma, allergic rhinitis, history rhinosinusitis, nasal polyps, complicated by rheumatoid arthritis/MTX/Humira, bipolar FOLLOWS FOR:Pt states he is still coughing [place]; has had several URI since last visit. [place] where he says most of the things he likes to do are dusty. He wears mask and goggles for this. Augmentin had helped clear cough and nasal congestion but they come back. Cough productive of sometimes green mucus, also from nose but denies headache or fever. FENO- 08/07/16- 25 Office spirometry 08/07/16- WNL, FEV1 3.59/ 110%, R 0.81  10/15/2016-65 year old male former smoker followed for asthma, allergic rhinitis, history rhinosinusitis, nasal polyps, complicated by rheumatoid arthritis/MTX/Humira, bipolar Acute visit-deep cough--since saturday--given Augmentin 1/6  by Dr. Benard Rink helping. fever last night 100.3. sputum is yellow--clear and bloody at times.   sick x 6 months. CXR 08/07/2016- clear, NAD Baseline increased cough over the last 6 months. Significantly worse in the last several days associated with fever, a few blood streaks in clear or yellow sputum with very hard coughing. GI okay. Pending follow-up appointment with ENT. History of extensive sinus surgery but he is having increased frontal pressure with increased green nasal discharge. Also reports some diplopia over the last 2 years-seeing eye doctor. Tolerating Augmentin with probiotic, but concerned it isn't long enough.  ROS-see HPI Constitutional:   No-   weight loss, night sweats, fevers, chills, fatigue, lassitude. HEENT:   No-  headaches, difficulty  swallowing, tooth/dental problems, sore throat,       No-  sneezing, itching, ear ache, +nasal congestion, post nasal drip,  CV:  No-   chest pain, orthopnea, PND, swelling in lower extremities, anasarca, dizziness, palpitations Resp: No-   shortness of breath with exertion or at rest.              +  productive cough,   non-productive cough,  + coughing up of blood.              +   change in color of mucus.   +Some wheezing.   Skin: No rash GI:  +heartburn, indigestion, abdominal pain, nausea, vomiting, GU:  MS: +  joint pain or swelling.   Neuro-     nothing unusual Psych:  No- change in mood or affect. No depression or anxiety.  No memory loss.  OBJ General- Alert, Oriented, Affect-appropriate, Distress- none acute, does not look toxic Skin-  Lymphadenopathy- none Head- atraumatic            Eyes- Gross vision intact, PERRLA, conjunctivae clear secretions            Ears- Hearing, canals-normal            Nose- Clear, no-Septal dev, mucus, polyps, erosion, perforation             Throat- Mallampati III , mucosa clear , drainage- none, tonsils- atrophic, + raspy voice Neck- flexible , trachea midline, no stridor , thyroid nl, carotid no bruit Chest - symmetrical excursion , unlabored           Heart/CV- RRR , no murmur , no gallop  , no rub, nl s1 s2                           -  JVD- none , edema- none, stasis changes- none, varices- none           Lung- + few mild rhonchi, wheeze- none, cough + hard/deep but not productive ,                                dullness-none, rub- none           Chest wall-  Abd-  Br/ Gen/ Rectal- Not done, not indicated Extrem- cyanosis- none, clubbing, none, atrophy- none, strength- nl.                          +Synovial thickening of hand joints. Neuro- grossly intact to observation

## 2016-10-15 NOTE — Assessment & Plan Note (Signed)
He is concerned he has acute sinus infection now. Plan-he will keep his appointment with ENT. Continue 10 days Augmentin previously begun, now adding doxycycline.

## 2016-10-16 NOTE — Progress Notes (Signed)
ATC, NA and no option to leave msg 

## 2016-10-21 NOTE — Progress Notes (Signed)
Office Visit Note  Patient: [name] [name]             Date of Birth: Nov 07, 1950           MRN: [id number]             PCP: Wenda Low, MD Referring: Wenda Low, MD Visit Date: 10/22/2016 Occupation: '@GUAROCC'$ @    Subjective:  stiffness   History of Present Illness: [name] [name] is a 66 y.o. male with history of seronegative rheumatoid arthritis. He states he's been doing well without any major flares. He has minimal morning stiffness. He states he had here shingles vaccine last year and he had to come off Humira for 2 months at the time he had a minor flare. He had recent flu for which she is recovering well.  Activities of Daily Living:  Patient reports morning stiffness for 10 minutes.   Patient Denies nocturnal pain.  Difficulty dressing/grooming: Denies Difficulty climbing stairs: Denies Difficulty getting out of chair: Denies Difficulty using hands for taps, buttons, cutlery, and/or writing: Reports   Review of Systems  Constitutional: Positive for fatigue. Negative for night sweats and weakness ( ).  HENT: Positive for mouth dryness. Negative for mouth sores and nose dryness.   Eyes: Negative for redness and dryness.  Respiratory: Positive for cough. Negative for shortness of breath and difficulty breathing.   Cardiovascular: Negative for chest pain, palpitations, hypertension, irregular heartbeat and swelling in legs/feet.  Gastrointestinal: Negative for constipation and diarrhea.  Endocrine: Negative for increased urination.  Musculoskeletal: Positive for arthralgias, joint pain and morning stiffness. Negative for joint swelling, myalgias, muscle weakness, muscle tenderness and myalgias.  Skin: Negative for color change, rash, hair loss, nodules/bumps, skin tightness, ulcers and sensitivity to sunlight.  Allergic/Immunologic: Negative for susceptible to infections.  Neurological: Negative for dizziness, fainting, memory loss and night sweats.    Hematological: Negative for swollen glands.  Psychiatric/Behavioral: Negative for depressed mood and sleep disturbance. The patient is not nervous/anxious.     PMFS History:  Patient Active Problem List   Diagnosis Date Noted  . Asthmatic bronchitis with acute exacerbation 10/15/2016  . Herpes zoster 08/01/2013  . Rheumatoid arthritis of multiple sites without rheumatoid factor ([place]) 09/18/2010  . NASAL POLYP 12/24/2007  . Sinusitis, chronic 12/24/2007  . Seasonal and perennial allergic rhinitis 12/24/2007  . Allergic-infective asthma 12/24/2007  . G E R D 12/24/2007    Past Medical History:  Diagnosis Date  . ALLERGIC RHINITIS   . Asthma   . GERD (gastroesophageal reflux disease)   . Rheumatoid arthritis(714.0)     Family History  Problem Relation Age of Onset  . Multiple sclerosis Father   . Breast cancer Mother   . Breast cancer Sister    Past Surgical History:  Procedure Laterality Date  . extensive sinus surgery with ablation of the frontal sinuses    . HERNIA REPAIR    . nasal polypectomies     Social History   Social History Narrative  . No narrative on file     Objective: Vital Signs: BP 121/68 (BP Location: Left Arm, Patient Position: Sitting, Cuff Size: Normal)   Pulse 70   Resp 12   Ht '5\' 8"'$  (1.727 m)   Wt 184 lb (83.5 kg)   BMI 27.98 kg/m    Physical Exam  Constitutional: He is oriented to person, place, and time. He appears well-developed and well-nourished.  HENT:  Head: Normocephalic and atraumatic.  Eyes: Conjunctivae and EOM are normal. Pupils  are equal, round, and reactive to light.  Neck: Normal range of motion. Neck supple.  Cardiovascular: Normal rate, regular rhythm and normal heart sounds.  Exam reveals no gallop and no friction rub.   No murmur heard. Pulmonary/Chest: Effort normal and breath sounds normal. No respiratory distress. He has no wheezes. He has no rales. He exhibits no tenderness.  Abdominal: Soft. Bowel sounds are  normal. He exhibits no distension and no mass. There is no tenderness. There is no guarding.  Musculoskeletal: Normal range of motion.  Lymphadenopathy:    He has no cervical adenopathy.  Neurological: He is alert and oriented to person, place, and time. He exhibits normal muscle tone. Coordination normal.  Skin: Skin is warm and dry. Capillary refill takes less than 2 seconds. No rash noted.  Psychiatric: He has a normal mood and affect. His behavior is normal. Judgment and thought content normal.  Nursing note and vitals reviewed.    Musculoskeletal Exam: C-spine and thoracic lumbar spine good range of motion. He is good range of motion of her shoulders elbow joints she has limited range of motion of bilateral wrist joints he has thickening of MCP joints bilaterally and PIP joints. His limited except full extension of his MCPs and PIP joints. Hip joints knee joints ankles MTPs PIPs with good range of motion with no synovitis.  CDAI Exam: CDAI Homunculus Exam:   Joint Counts:  CDAI Tender Joint count: 0 CDAI Swollen Joint count: 0  Global Assessments:  Patient Global Assessment: 2 Provider Global Assessment: 2  CDAI Calculated Score: 4    Investigation: No additional findings.  Orders Only on 08/24/2016  Component Date Value Ref Range Status  . WBC 08/24/2016 7.1  3.8 - 10.8 K/uL Final  . RBC 08/24/2016 4.50  4.20 - 5.80 MIL/uL Final  . Hemoglobin 08/24/2016 14.4  13.2 - 17.1 g/dL Final  . HCT 08/24/2016 44.4  38.5 - 50.0 % Final  . MCV 08/24/2016 98.7  80.0 - 100.0 fL Final  . MCH 08/24/2016 32.0  27.0 - 33.0 pg Final  . MCHC 08/24/2016 32.4  32.0 - 36.0 g/dL Final  . RDW 08/24/2016 13.5  11.0 - 15.0 % Final  . Platelets 08/24/2016 302  140 - 400 K/uL Final  . MPV 08/24/2016 10.2  7.5 - 12.5 fL Final  . Neutro Abs 08/24/2016 3976  1,500 - 7,800 cells/uL Final  . Lymphs Abs 08/24/2016 2343  850 - 3,900 cells/uL Final  . Monocytes Absolute 08/24/2016 639  200 - 950  cells/uL Final  . Eosinophils Absolute 08/24/2016 142  15 - 500 cells/uL Final  . Basophils Absolute 08/24/2016 0  0 - 200 cells/uL Final  . Neutrophils Relative % 08/24/2016 56  % Final  . Lymphocytes Relative 08/24/2016 33  % Final  . Monocytes Relative 08/24/2016 9  % Final  . Eosinophils Relative 08/24/2016 2  % Final  . Basophils Relative 08/24/2016 0  % Final  . Smear Review 08/24/2016 Criteria for review not met   Final  . Sodium 08/24/2016 141  135 - 146 mmol/L Final  . Potassium 08/24/2016 3.9  3.5 - 5.3 mmol/L Final  . Chloride 08/24/2016 103  98 - 110 mmol/L Final  . CO2 08/24/2016 28  20 - 31 mmol/L Final  . Glucose, Bld 08/24/2016 89  65 - 99 mg/dL Final  . BUN 08/24/2016 14  7 - 25 mg/dL Final  . Creat 08/24/2016 0.92  0.70 - 1.25 mg/dL Final   Comment:  For patients > or = 65 years of age: The upper reference limit for Creatinine is approximately 13% higher for people identified as African-American.     . Total Bilirubin 08/24/2016 0.6  0.2 - 1.2 mg/dL Final  . Alkaline Phosphatase 08/24/2016 51  40 - 115 U/L Final  . AST 08/24/2016 24  10 - 35 U/L Final  . ALT 08/24/2016 23  9 - 46 U/L Final  . Total Protein 08/24/2016 6.8  6.1 - 8.1 g/dL Final  . Albumin 08/24/2016 4.3  3.6 - 5.1 g/dL Final  . Calcium 08/24/2016 9.9  8.6 - 10.3 mg/dL Final  . GFR, Est African American 08/24/2016 >89  >=60 mL/min Final  . GFR, Est Non African American 08/24/2016 88  >=60 mL/min Final  Lab on 08/24/2016  Component Date Value Ref Range Status  . Interferon Gamma Release Assay 08/27/2016 NEGATIVE  NEGATIVE Final  . Quantiferon Nil Value 08/27/2016 0.04  IU/mL Final  . Mitogen-Nil 08/27/2016 7.79  IU/mL Final  . Quantiferon Tb Ag Minus Nil Value 08/27/2016 0.01  IU/mL Final   Comment:   The Nil tube value is used to determine if the patient has a preexisting immune response which could cause a false-positive reading on the test. In order for a test to be valid, the Nil tube must  have a value of less than or equal to 8.0 IU/mL.   The mitogen control tube is used to assure the patient has a healthy immune status and also serves as a control for correct blood handling and incubation. It is used to detect false-negative readings. The mitogen tube must have a gamma interferon value of greater than or equal to 0.5 IU/mL higher than the value of the Nil tube.   The TB antigen tube is coated with the M. tuberculosis specific antigens. For a test to be considered positive, the TB antigen tube value minus the Nil tube value must be greater than or equal to 0.35 IU/mL.   For additional information, please refer to http://education.quest[facility].com/faq/QFT (This link is being provided for informational/educational purposes only.)   Office Visit on 08/07/2016  Component Date Value Ref Range Status  . Nitric Oxide 08/07/2016 25   Final     Imaging: Dg Chest 2 View  Result Date: 10/15/2016 CLINICAL DATA:  Cough and congestion. Wheezing. Shortness of breath. EXAM: CHEST  2 VIEW COMPARISON:  Two-view chest x-ray 08/07/2016 FINDINGS: The heart size and mediastinal contours are within normal limits. Both lungs are clear. The visualized skeletal structures are unremarkable. IMPRESSION: Negative two view chest. Electronically Signed   By: San Morelle M.D.   On: 10/15/2016 16:20    Speciality Comments: No specialty comments available.    Procedures:  No procedures performed Allergies: Simponi [golimumab]; Aspirin; Sulfonamide derivatives; and Theophyllines   Assessment / Plan:     Visit Diagnoses: Rheumatoid arthritis of multiple sites without rheumatoid factor ([place]) - RF-,[id number]-,[id number]-: He is doing really well with no synovitis on examination he continues to have some stiffness.  High risk medications (not anticoagulants) long-term use - MTX 4 tabs po q wk, Folic acid 57m,Humira 40 mg sq qowk -he is concerned about the future use of Humira as he'll be switching  to Medicare. We discussed different treatment options. He has had problems with frequent infections following was on Simponi but he did tolerated quite well. He felt it was not as effective as some Humira. Plan: CBC with Differential/Platelet, COMPLETE METABOLIC PANEL WITH GFR  Upper respiratory tract  infection, he had recent flu use gradually recovering from that.  Seasonal and perennial allergic rhinitis  Gastroesophageal reflux disease without esophagitis  History of depression : According to patient the symptoms is almost resolved.   Orders: Orders Placed This Encounter  Procedures  . CBC with Differential/Platelet  . COMPLETE METABOLIC PANEL WITH GFR   No orders of the defined types were placed in this encounter.   Face-to-face time spent with patient was 30 minutes. 50% of time was spent in counseling and coordination of care.  Follow-Up Instructions: Return in about 5 months (around 03/22/2017) for Rheumatoid arthritis.   Bo Merino, MD  Note - This record has been created using Editor, commissioning.  Chart creation errors have been sought, but may not always  have been located. Such creation errors do not reflect on  the standard of medical care.

## 2016-10-22 ENCOUNTER — Ambulatory Visit (INDEPENDENT_AMBULATORY_CARE_PROVIDER_SITE_OTHER): Payer: Medicare Other | Admitting: Rheumatology

## 2016-10-22 ENCOUNTER — Encounter: Payer: Self-pay | Admitting: Rheumatology

## 2016-10-22 VITALS — BP 121/68 | HR 70 | Resp 12 | Ht 68.0 in | Wt 184.0 lb

## 2016-10-22 DIAGNOSIS — J3089 Other allergic rhinitis: Secondary | ICD-10-CM

## 2016-10-22 DIAGNOSIS — K219 Gastro-esophageal reflux disease without esophagitis: Secondary | ICD-10-CM | POA: Diagnosis not present

## 2016-10-22 DIAGNOSIS — Z8659 Personal history of other mental and behavioral disorders: Secondary | ICD-10-CM | POA: Diagnosis not present

## 2016-10-22 DIAGNOSIS — Z79899 Other long term (current) drug therapy: Secondary | ICD-10-CM | POA: Diagnosis not present

## 2016-10-22 DIAGNOSIS — J302 Other seasonal allergic rhinitis: Secondary | ICD-10-CM | POA: Diagnosis not present

## 2016-10-22 DIAGNOSIS — M0609 Rheumatoid arthritis without rheumatoid factor, multiple sites: Secondary | ICD-10-CM

## 2016-10-22 NOTE — Progress Notes (Signed)
Rheumatology Medication Review by a Pharmacist Does the patient feel that his/her medications are working for him/her?  Yes Has the patient been experiencing any side effects to the medications prescribed?  No Does the patient have any problems obtaining medications?  No, patient currently denies issues with access to medications.  He reports concern over access to Humira once he turns 66 and switches to medicare part D insurance.    Issues to address at subsequent visits: Access to biologics with medicare   Pharmacist comments:  [name] [name] is a pleasant 66 yo M who presents to clinic for follow up of his rheumatoid arthritis.  Patient had most recent standing labs on 08/24/16 which were normal and had his annual TB Gold at that time which was negative.  Patient will be due for standing labs again in February 2018 and will be due for TB Gold in November 2018.    Patient is switching to medicare in November 2018 and continues to have concern regarding [employer] and biologic medications.  Patient was interested in learning more about infusion medications and medicare.  Advised patient that infusions are best covered by medicare supplement plans.  I previously gave patient information on the [place] and advised patient to contact the [facility] and ask which medicare plan would be best for coverage of biologic infusions.  Patient voiced understanding and confirms he still has their contact information.  Patient denied any other medication related questions at this time.    Elisabeth Most, Pharm.D., BCPS, CPP Clinical Pharmacist Pager: [phone] Phone: [phone] 10/22/2016 9:35 AM

## 2016-10-22 NOTE — Patient Instructions (Signed)
Standing Labs We placed an order today for your standing lab work.    Please come back and get your standing labs in February and every 3 months  We have open lab Monday through Friday from 8:30-11:30 AM and 1:30-4 PM at the office of Dr. Gentle Hoge/Naitik Panwala, PA.   The office is located at [address], [address], [place], [place] [address] No appointment is necessary.   Labs are drawn by [facility].  You may receive a bill from [facility] for your lab work.    

## 2016-10-23 ENCOUNTER — Telehealth: Payer: Self-pay | Admitting: Internal Medicine

## 2016-10-23 DIAGNOSIS — R05 Cough: Secondary | ICD-10-CM

## 2016-10-23 DIAGNOSIS — R059 Cough, unspecified: Secondary | ICD-10-CM

## 2016-10-23 NOTE — Telephone Encounter (Signed)
Pt calling c/o heavy congestion in chest, hacky cough and headache. Pt notes some sinus congestion/pressure.  Pt has completed Augmentin antibiotic this morning. Pt still taking Doxycycline and has 4 pills left.  Pt reports yellow mucus. Denies fever.   Please advise Dr Annamaria Boots. Thanks.   Allergies  Allergen Reactions  . Simponi [Golimumab]     Repeated infections  . Aspirin     REACTION: throat swelling  . Sulfonamide Derivatives     REACTION: oral sores  . Theophyllines    Allergies as of 10/23/2016      Reactions   Simponi [golimumab]    Repeated infections   Aspirin    REACTION: throat swelling   Sulfonamide Derivatives    REACTION: oral sores   Theophyllines       Medication List       Accurate as of 10/23/16  9:26 AM. Always use your most recent med list.          amoxicillin-clavulanate 875-125 MG tablet Commonly known as:  AUGMENTIN Take 1 tablet by mouth 2 (two) times daily.   clonazePAM 0.5 MG tablet Commonly known as:  KLONOPIN Take 0.5 mg by mouth daily.   doxycycline 100 MG tablet Commonly known as:  VIBRA-TABS Take 1 tablet (100 mg total) by mouth 2 (two) times daily.   fluticasone 50 MCG/ACT nasal spray Commonly known as:  FLONASE Place 1 spray into both nostrils daily.   fluticasone furoate-vilanterol 200-25 MCG/INH Aepb Commonly known as:  BREO ELLIPTA Inhale 1 puff into the lungs daily.   folic acid 1 MG tablet Commonly known as:  FOLVITE Take 2 tablets (2 mg total) by mouth daily.   HUMIRA PEN 40 MG/0.8ML Pnkt Generic drug:  Adalimumab INJECT 1 PEN UNDER THE SKIN EVERY OTHER WEEK   lithium carbonate 300 MG capsule Take 300 mg by mouth at bedtime.   methotrexate 2.5 MG tablet Commonly known as:  RHEUMATREX Take 5 tablets (12.5 mg total) by mouth once a week. Take 5 tablets weekly   montelukast 10 MG tablet Commonly known as:  SINGULAIR TAKE 1 TABLET DAILY   oxymetazoline 0.05 % nasal spray Commonly known as:  AFRIN Place 2  sprays into the nose daily.   PROAIR HFA 108 (90 Base) MCG/ACT inhaler Generic drug:  albuterol USE 2 INHALATIONS EVERY 4 HOURS AS NEEDED   promethazine-codeine 6.25-10 MG/5ML syrup Commonly known as:  PHENERGAN with CODEINE Take 5 mLs by mouth every 6 (six) hours as needed for cough.   sildenafil 100 MG tablet Commonly known as:  VIAGRA Take 100 mg by mouth daily as needed.   valACYclovir 1000 MG tablet Commonly known as:  VALTREX 1 every 8 hours x 7 days   vitamin C 1000 MG tablet Take 1,000 mg by mouth 2 (two) times daily.

## 2016-10-23 NOTE — Telephone Encounter (Signed)
Pt aware of recs per Dr Annamaria Boots.  Sputum culture orders placed and cups placed up front for patient to pick up.  Order placed for PFT and PFT scheduled for 11/01/16 at 1pm.  Nothing further needed.

## 2016-10-23 NOTE — Telephone Encounter (Signed)
Finish doxycycline  Order- sputum C&S-  Routine, fungal, AFB  Order- schedule PFT     Dx chronic bronchitis exacerbation

## 2016-11-01 ENCOUNTER — Ambulatory Visit (INDEPENDENT_AMBULATORY_CARE_PROVIDER_SITE_OTHER): Payer: Medicare Other | Admitting: Internal Medicine

## 2016-11-01 ENCOUNTER — Telehealth: Payer: Self-pay | Admitting: Internal Medicine

## 2016-11-01 ENCOUNTER — Encounter: Payer: Self-pay | Admitting: *Deleted

## 2016-11-01 DIAGNOSIS — R05 Cough: Secondary | ICD-10-CM | POA: Diagnosis not present

## 2016-11-01 DIAGNOSIS — R059 Cough, unspecified: Secondary | ICD-10-CM

## 2016-11-01 LAB — PULMONARY FUNCTION TEST
DL/VA % PRED: 94 %
DL/VA: 4.23 ml/min/mmHg/L
DLCO COR % PRED: 110 %
DLCO UNC % PRED: 99 %
DLCO UNC: 29.51 ml/min/mmHg
DLCO cor: 32.79 ml/min/mmHg
FEF 25-75 POST: 3.49 L/s
FEF 25-75 Pre: 2.9 L/sec
FEF2575-%CHANGE-POST: 20 %
FEF2575-%PRED-POST: 135 %
FEF2575-%Pred-Pre: 112 %
FEV1-%CHANGE-POST: 6 %
FEV1-%PRED-POST: 124 %
FEV1-%Pred-Pre: 117 %
FEV1-PRE: 3.77 L
FEV1-Post: 4 L
FEV1FVC-%CHANGE-POST: 2 %
FEV1FVC-%PRED-PRE: 98 %
FEV6-%Change-Post: 4 %
FEV6-%Pred-Post: 128 %
FEV6-%Pred-Pre: 122 %
FEV6-PRE: 5 L
FEV6-Post: 5.24 L
FEV6FVC-%CHANGE-POST: 0 %
FEV6FVC-%PRED-PRE: 103 %
FEV6FVC-%Pred-Post: 104 %
FVC-%Change-Post: 3 %
FVC-%Pred-Post: 123 %
FVC-%Pred-Pre: 118 %
FVC-PRE: 5.11 L
FVC-Post: 5.3 L
POST FEV1/FVC RATIO: 75 %
PRE FEV6/FVC RATIO: 98 %
Post FEV6/FVC ratio: 99 %
Pre FEV1/FVC ratio: 74 %
RV % PRED: 149 %
RV: 3.33 L
TLC % pred: 131 %
TLC: 8.7 L

## 2016-11-01 NOTE — Telephone Encounter (Signed)
Letter created and faxed  Pt aware and nothing further needed

## 2016-11-01 NOTE — Telephone Encounter (Signed)
Per CY-okay to create letter stating patient was seen in our office on 10-16-16 due to illness and please reconsider No Show fee for missed appt. Thanks.

## 2016-11-01 NOTE — Telephone Encounter (Signed)
CY pt stated that he was seen in our office by you on 1-8 due to being sick and he stated that he missed his appt with his psychiatrist due to being sick and would like to know if we can send a letter to them letting them know that the pt was indeed sick and that is why he missed this appt, so they will not charge him for missing his appt. CY please advise. thanks

## 2016-11-06 DIAGNOSIS — H532 Diplopia: Secondary | ICD-10-CM | POA: Diagnosis not present

## 2016-11-07 ENCOUNTER — Other Ambulatory Visit: Payer: Self-pay | Admitting: *Deleted

## 2016-11-07 DIAGNOSIS — Z79899 Other long term (current) drug therapy: Secondary | ICD-10-CM | POA: Diagnosis not present

## 2016-11-07 LAB — CBC WITH DIFFERENTIAL/PLATELET
Basophils Absolute: 0 cells/uL (ref 0–200)
Basophils Relative: 0 %
EOS PCT: 3 %
Eosinophils Absolute: 162 cells/uL (ref 15–500)
HCT: 41.2 % (ref 38.5–50.0)
Hemoglobin: 13.9 g/dL (ref 13.2–17.1)
LYMPHS ABS: 1728 {cells}/uL (ref 850–3900)
LYMPHS PCT: 32 %
MCH: 32.1 pg (ref 27.0–33.0)
MCHC: 33.7 g/dL (ref 32.0–36.0)
MCV: 95.2 fL (ref 80.0–100.0)
MONOS PCT: 10 %
MPV: 10.4 fL (ref 7.5–12.5)
Monocytes Absolute: 540 cells/uL (ref 200–950)
NEUTROS PCT: 55 %
Neutro Abs: 2970 cells/uL (ref 1500–7800)
PLATELETS: 299 10*3/uL (ref 140–400)
RBC: 4.33 MIL/uL (ref 4.20–5.80)
RDW: 14.2 % (ref 11.0–15.0)
WBC: 5.4 10*3/uL (ref 3.8–10.8)

## 2016-11-08 LAB — COMPLETE METABOLIC PANEL WITH GFR
ALT: 22 U/L (ref 9–46)
AST: 20 U/L (ref 10–35)
Albumin: 3.9 g/dL (ref 3.6–5.1)
Alkaline Phosphatase: 50 U/L (ref 40–115)
BUN: 12 mg/dL (ref 7–25)
CHLORIDE: 106 mmol/L (ref 98–110)
CO2: 26 mmol/L (ref 20–31)
Calcium: 9.5 mg/dL (ref 8.6–10.3)
Creat: 0.97 mg/dL (ref 0.70–1.25)
GFR, Est African American: >89 mL/min (ref >=60)
GFR, Est Non African American: 82 mL/min (ref >=60)
GLUCOSE: 113 mg/dL — AB (ref 65–99)
POTASSIUM: 4.2 mmol/L (ref 3.5–5.3)
SODIUM: 141 mmol/L (ref 135–146)
Total Bilirubin: 0.5 mg/dL (ref 0.2–1.2)
Total Protein: 6.4 g/dL (ref 6.1–8.1)

## 2016-11-08 LAB — LITHIUM LEVEL: Lithium Lvl: 0.8 mmol/L (ref 0.6–1.2)

## 2016-11-09 NOTE — Progress Notes (Signed)
Send copy of labs to patient's PCPAn tell patient#1: CMP with GFR is normal, CBC with differential is normal, lithium levels are normal

## 2016-11-14 ENCOUNTER — Other Ambulatory Visit: Payer: Self-pay | Admitting: Rheumatology

## 2016-11-14 NOTE — Telephone Encounter (Signed)
Last Visit:10/22/16 Next Visit: 02/15/17 Labs: 11/07/16 TB Gold: 08/24/17 Neg  Okay to refill Humira?

## 2016-11-24 ENCOUNTER — Other Ambulatory Visit: Payer: Self-pay | Admitting: Internal Medicine

## 2016-11-26 NOTE — Telephone Encounter (Signed)
Addendum: Patient never picked up sample cups which were left up front for sputum sample on 10/23/16. After reviewing chart the patient never gave a sputum sample. Will send to [facility] as FYI that the patient never followed through so that she may discuss with Dr Annamaria Boots. Nothing further needed.

## 2016-11-27 NOTE — Telephone Encounter (Signed)
Noted  

## 2016-12-13 DIAGNOSIS — E230 Hypopituitarism: Secondary | ICD-10-CM | POA: Diagnosis not present

## 2016-12-13 DIAGNOSIS — R5382 Chronic fatigue, unspecified: Secondary | ICD-10-CM | POA: Diagnosis not present

## 2017-01-29 ENCOUNTER — Telehealth: Payer: Self-pay | Admitting: Internal Medicine

## 2017-01-29 MED ORDER — AMOXICILLIN-POT CLAVULANATE 875-125 MG PO TABS: 1.0000 | ORAL_TABLET | Freq: Two times a day (BID) | ORAL | 0 refills | Status: DC

## 2017-01-29 NOTE — Telephone Encounter (Signed)
Offer augmentin 875, # 20, ref x 1   1 twice daily  Suggest regular use of a nasal saline rinse- Neti pot or NeilMed type  Suggest Sudafed from pharmacist if he can take this

## 2017-01-29 NOTE — Telephone Encounter (Signed)
Pt aware of results/recs.  rx sent to preferred pharmacy.  Nothing further needed.  

## 2017-01-29 NOTE — Telephone Encounter (Signed)
Pt c/o HA, PND, sinus congestion, watery and swollen eyes, prod cough with clear/green/brown mucus X4 days.  Denies fever, chest pain, nausea, vomiting.    Pt has been using OTC eye drops, mucinex.  Requesting further recs.    Pt uses [facility] on [address]   Last ov: 10/15/16 Next ov: 02/12/17  CY please advise on further recs.  Thanks!

## 2017-02-12 ENCOUNTER — Ambulatory Visit (INDEPENDENT_AMBULATORY_CARE_PROVIDER_SITE_OTHER): Payer: Medicare Other | Admitting: Internal Medicine

## 2017-02-12 ENCOUNTER — Encounter: Payer: Self-pay | Admitting: Internal Medicine

## 2017-02-12 DIAGNOSIS — J4520 Mild intermittent asthma, uncomplicated: Secondary | ICD-10-CM | POA: Insufficient documentation

## 2017-02-12 DIAGNOSIS — J324 Chronic pansinusitis: Secondary | ICD-10-CM

## 2017-02-12 DIAGNOSIS — M0609 Rheumatoid arthritis without rheumatoid factor, multiple sites: Secondary | ICD-10-CM | POA: Diagnosis not present

## 2017-02-12 HISTORY — DX: Mild intermittent asthma, uncomplicated: J45.20

## 2017-02-12 NOTE — Progress Notes (Signed)
HPI M former smoker followed for asthma, allergic rhinitis, hx rhinosinusitis, nasal polyps complicated by rheumatoid arthritis. FENO- 08/07/16- 25 Office spirometry 08/07/16- WNL, FEV1 3.59/ 110%, R 0.81 PFT 11/01/16- WNL-FVC 5.30/123%, FEV1 4.0/124%, ratio 0.75, FEF 25-75% 3.49/135%, TLC 131%, DLCO 99% ---------------------------------------------------------------  10/15/2016-66 year old male former smoker followed for asthma, allergic rhinitis, history rhinosinusitis, nasal polyps, complicated by rheumatoid arthritis/MTX/Humira, bipolar Acute visit-deep cough--since saturday--given Augmentin 1/6  by Dr. Benard Rink helping. fever last night 100.3. sputum is yellow--clear and bloody at times.   sick x 6 months. CXR 08/07/2016- clear, NAD Baseline increased cough over the last 6 months. Significantly worse in the last several days associated with fever, a few blood streaks in clear or yellow sputum with very hard coughing. GI okay. Pending follow-up appointment with ENT. History of extensive sinus surgery but he is having increased frontal pressure with increased green nasal discharge. Also reports some diplopia over the last 2 years-seeing eye doctor. Tolerating Augmentin with probiotic, but concerned it isn't long enough.  02/12/17- 66 year old male former smoker followed for asthma, allergic rhinitis, history rhinosinusitis, nasal polyps, complicated by rheumatoid arthritis/MTX/Humira, bipolar FOLLOWS FOR: Pt states he had a flare up about 2 weeks ago-was given abx and helped clear up. Review PFT with patient.  Probably respecting impact of his Humira, he finds that starting antibiotics early with onset of sinus infection helps prevent progression into his lungs. Feeling well today. Occasional use of rescue inhaler. Continues other meds as prescribed. CXR 10/15/16 IMPRESSION: Negative two view chest. PFT 11/01/16- WNL-FVC 5.30/123%, FEV1 4.0/124%, ratio 0.75, FEF 25-75% 3.49/135%, TLC 131%, DLCO  99%  ROS-see HPI    + = pos Constitutional:   No-   weight loss, night sweats, fevers, chills, fatigue, lassitude. HEENT:   No-  headaches, difficulty swallowing, tooth/dental problems, sore throat,       No-  sneezing, itching, ear ache, nasal congestion, post nasal drip,  CV:  No-   chest pain, orthopnea, PND, swelling in lower extremities, anasarca, dizziness, palpitations Resp: No-   shortness of breath with exertion or at rest.                productive cough,   non-productive cough,   coughing up of blood.                 change in color of mucus.    wheezing.   Skin: No rash GI:  heartburn, indigestion, abdominal pain, nausea, vomiting, GU:  MS: +  joint pain or swelling.   Neuro-     nothing unusual Psych:  No- change in mood or affect. No depression or anxiety.  No memory loss.  OBJ General- Alert, Oriented, Affect-appropriate, Distress- none acute,  Skin-  Lymphadenopathy- none Head- atraumatic            Eyes- Gross vision intact, PERRLA, conjunctivae clear secretions            Ears- Hearing, canals-normal            Nose- Clear, no-Septal dev, mucus, polyps, erosion, perforation             Throat- Mallampati III , mucosa clear , drainage- none, tonsils- atrophic, + raspy voice Neck- flexible , trachea midline, no stridor , thyroid nl, carotid no bruit Chest - symmetrical excursion , unlabored           Heart/CV- RRR , no murmur , no gallop  , no rub, nl s1 s2                           -  JVD- none , edema- none, stasis changes- none, varices- none           Lung- + clear, wheeze- none, cough -none                               dullness-none, rub- none           Chest wall-  Abd-  Br/ Gen/ Rectal- Not done, not indicated Extrem- cyanosis- none, clubbing, none, atrophy- none, strength- nl.                          +Synovial thickening of hand joints. Neuro- grossly intact to observation

## 2017-02-12 NOTE — Assessment & Plan Note (Signed)
Former smoker with rheumatoid arthritis. I'm very pleased to see PFTs looking this good. Exam is completely clear today. Current meds are appropriate.

## 2017-02-12 NOTE — Assessment & Plan Note (Signed)
He expects his rheumatologist will have to change from Humira to meet Medicare formulary next year. We are not seeing evidence of rheumatoid lung disease.

## 2017-02-12 NOTE — Assessment & Plan Note (Signed)
Recent exacerbation responded to antibiotics well. He is comfortable today.

## 2017-02-12 NOTE — Patient Instructions (Addendum)
We can continue meds- please call as needed  Please call if we can help

## 2017-02-15 ENCOUNTER — Ambulatory Visit (INDEPENDENT_AMBULATORY_CARE_PROVIDER_SITE_OTHER): Payer: Medicare Other | Admitting: Rheumatology

## 2017-02-15 ENCOUNTER — Encounter: Payer: Self-pay | Admitting: Rheumatology

## 2017-02-15 VITALS — BP 104/57 | HR 60 | Resp 12 | Ht 68.0 in | Wt 185.0 lb

## 2017-02-15 DIAGNOSIS — M19042 Primary osteoarthritis, left hand: Secondary | ICD-10-CM

## 2017-02-15 DIAGNOSIS — Z79899 Other long term (current) drug therapy: Secondary | ICD-10-CM | POA: Diagnosis not present

## 2017-02-15 DIAGNOSIS — M79641 Pain in right hand: Secondary | ICD-10-CM | POA: Diagnosis not present

## 2017-02-15 DIAGNOSIS — M19041 Primary osteoarthritis, right hand: Secondary | ICD-10-CM | POA: Diagnosis not present

## 2017-02-15 DIAGNOSIS — M0609 Rheumatoid arthritis without rheumatoid factor, multiple sites: Secondary | ICD-10-CM | POA: Diagnosis not present

## 2017-02-15 DIAGNOSIS — M79642 Pain in left hand: Secondary | ICD-10-CM | POA: Diagnosis not present

## 2017-02-15 LAB — COMPLETE METABOLIC PANEL WITH GFR
ALT: 32 U/L (ref 9–46)
AST: 25 U/L (ref 10–35)
Albumin: 4.1 g/dL (ref 3.6–5.1)
Alkaline Phosphatase: 59 U/L (ref 40–115)
BILIRUBIN TOTAL: 0.3 mg/dL (ref 0.2–1.2)
BUN: 12 mg/dL (ref 7–25)
CHLORIDE: 105 mmol/L (ref 98–110)
CO2: 26 mmol/L (ref 20–31)
Calcium: 9.3 mg/dL (ref 8.6–10.3)
Creat: 1.02 mg/dL (ref 0.70–1.25)
GFR, EST AFRICAN AMERICAN: 89 mL/min (ref >=60)
GFR, EST NON AFRICAN AMERICAN: 77 mL/min (ref >=60)
Glucose, Bld: 84 mg/dL (ref 65–99)
Potassium: 3.9 mmol/L (ref 3.5–5.3)
Sodium: 141 mmol/L (ref 135–146)
Total Protein: 6.5 g/dL (ref 6.1–8.1)

## 2017-02-15 LAB — CBC WITH DIFFERENTIAL/PLATELET
BASOS ABS: 0 {cells}/uL (ref 0–200)
Basophils Relative: 0 %
EOS PCT: 2 %
Eosinophils Absolute: 126 cells/uL (ref 15–500)
HCT: 43.6 % (ref 38.5–50.0)
Hemoglobin: 14.3 g/dL (ref 13.2–17.1)
Lymphocytes Relative: 29 %
Lymphs Abs: 1827 cells/uL (ref 850–3900)
MCH: 31.9 pg (ref 27.0–33.0)
MCHC: 32.8 g/dL (ref 32.0–36.0)
MCV: 97.3 fL (ref 80.0–100.0)
MONOS PCT: 11 %
MPV: 10.2 fL (ref 7.5–12.5)
Monocytes Absolute: 693 cells/uL (ref 200–950)
NEUTROS ABS: 3654 {cells}/uL (ref 1500–7800)
Neutrophils Relative %: 58 %
PLATELETS: 307 10*3/uL (ref 140–400)
RBC: 4.48 MIL/uL (ref 4.20–5.80)
RDW: 14.1 % (ref 11.0–15.0)
WBC: 6.3 10*3/uL (ref 3.8–10.8)

## 2017-02-15 NOTE — Progress Notes (Signed)
Rheumatology Medication Review by a Pharmacist Does the patient feel that his/her medications are working for him/her?  Yes Has the patient been experiencing any side effects to the medications prescribed?  No Does the patient have any problems obtaining medications?  No  Issues to address at subsequent visits: None   Pharmacist comments:  [name] [name] is a pleasant 66 yo M who presents for follow up of his rheumatoid arthritis.  He is currently taking Humira 40 mg every other week, methotrexate 5 tablets weekly, and folic acid 2 mg daily. Most recent standing labs were 11/07/16.  Will recommend standing labs today.  Most recent TB Gold negative 08/24/16.  He will be due for TB Gold again in November 2018.    Patient is switching to medicare in November 2018 and continues to have concern regarding [employer] and biologic medications.  Patient was interested in learning more about infusion medications and medicare.  Advised patient that infusions are best covered by medicare supplement plans.  [name] informed me we are considering [facility] infusions once patient goes on medicare.  Provided patient with information to read on [employer].  Patient denies any questions or concerns regarding his medications at this time.    Elisabeth Most, Pharm.D., BCPS, CPP Clinical Pharmacist Pager: [phone] Phone: [phone] 02/15/2017 10:06 AM

## 2017-02-15 NOTE — Progress Notes (Signed)
Office Visit Note  Patient: [name] [name]             Date of Birth: 1951-07-20           MRN: [id number]             PCP: Wenda Low, MD Referring: Wenda Low, MD Visit Date: 02/15/2017 Occupation: @GUAROCC @    Subjective:  stiffness   History of Present Illness: [name] [name] is a 66 y.o. male   With a history of rheumatoid arthritis without rheumatoid factor. He is responding well to Humira every 2 weeks and methotrexate 5 per week and folic acid 2 per day.  No joint pain swelling or some stiffness.  History of OA of bilateral hands. Occasional discomfort. No change from the last visit.  He rates his RA discomfort as 6 on a scale as of 0-10 when he's exercising just as he did on the last visit.  Patient states that he will start Medicare coming up soon in November when he becomes 66 years old and will be on Medicare. He will lose his Pharmacist, community. He discussed with [place] what potential medications he continues for RA. Dr. Estanislado Pandy discussed with him various options with one of the options being Orencia IV. Patient is agreeable.  TB gold was negative November 2017.  Patient continues to exercise at [place] [facility]:  Patient reports morning stiffness for 15 minutes.   Patient Denies nocturnal pain.  Difficulty dressing/grooming: Denies Difficulty climbing stairs: Denies Difficulty getting out of chair: Denies Difficulty using hands for taps, buttons, cutlery, and/or writing: Denies   Review of Systems  Constitutional: Negative for fatigue.  HENT: Negative for mouth sores and mouth dryness.   Eyes: Negative for dryness.  Respiratory: Negative for shortness of breath.   Gastrointestinal: Negative for constipation and diarrhea.  Musculoskeletal: Negative for myalgias and myalgias.  Skin: Negative for sensitivity to sunlight.  Neurological: Negative for memory loss.  Psychiatric/Behavioral: Negative for sleep disturbance.     PMFS History:  Patient Active Problem List   Diagnosis Date Noted  . Asthmatic bronchitis, mild intermittent, uncomplicated 20/94/7096  . Asthmatic bronchitis with acute exacerbation 10/15/2016  . Herpes zoster 08/01/2013  . Rheumatoid arthritis of multiple sites without rheumatoid factor ([place]) 09/18/2010  . NASAL POLYP 12/24/2007  . Sinusitis, chronic 12/24/2007  . Seasonal and perennial allergic rhinitis 12/24/2007  . Allergic-infective asthma 12/24/2007  . G E R D 12/24/2007    Past Medical History:  Diagnosis Date  . ALLERGIC RHINITIS   . Asthma   . GERD (gastroesophageal reflux disease)   . Rheumatoid arthritis(714.0)     Family History  Problem Relation Age of Onset  . Multiple sclerosis Father   . Breast cancer Mother   . Breast cancer Sister    Past Surgical History:  Procedure Laterality Date  . extensive sinus surgery with ablation of the frontal sinuses    . HERNIA REPAIR    . nasal polypectomies     Social History   Social History Narrative  . No narrative on file     Objective: Vital Signs: BP (!) 104/57   Pulse 60   Resp 12   Ht 5' 8"  (1.727 m)   Wt 185 lb (83.9 kg)   BMI 28.13 kg/m    Physical Exam  Constitutional: He is oriented to person, place, and time. He appears well-developed and well-nourished.  HENT:  Head: Normocephalic and atraumatic.  Eyes: Conjunctivae and EOM  are normal. Pupils are equal, round, and reactive to light.  Neck: Normal range of motion. Neck supple.  Cardiovascular: Normal rate, regular rhythm and normal heart sounds.  Exam reveals no gallop and no friction rub.   No murmur heard. Pulmonary/Chest: Effort normal and breath sounds normal. No respiratory distress. He has no wheezes. He has no rales. He exhibits no tenderness.  Abdominal: Soft. He exhibits no distension and no mass. There is no tenderness. There is no guarding.  Musculoskeletal: Normal range of motion.  Lymphadenopathy:    He has no cervical  adenopathy.  Neurological: He is alert and oriented to person, place, and time. He exhibits normal muscle tone. Coordination normal.  Skin: Skin is warm and dry. Capillary refill takes less than 2 seconds. No rash noted.  Psychiatric: He has a normal mood and affect. His behavior is normal. Judgment and thought content normal.  Vitals reviewed.    Musculoskeletal Exam:  Full range of motion of all joints Grip strength is equal and strong bilaterally Fiber myalgia tender points are all absent  CDAI Exam: CDAI Homunculus Exam:   Joint Counts:  CDAI Tender Joint count: 0 CDAI Swollen Joint count: 0  Global Assessments:  Patient Global Assessment: 6 Provider Global Assessment: 6  CDAI Calculated Score: 12    Investigation: No additional findings. Orders Only on 11/07/2016  Component Date Value Ref Range Status  . WBC 11/07/2016 5.4  3.8 - 10.8 K/uL Final  . RBC 11/07/2016 4.33  4.20 - 5.80 MIL/uL Final  . Hemoglobin 11/07/2016 13.9  13.2 - 17.1 g/dL Final  . HCT 11/07/2016 41.2  38.5 - 50.0 % Final  . MCV 11/07/2016 95.2  80.0 - 100.0 fL Final  . MCH 11/07/2016 32.1  27.0 - 33.0 pg Final  . MCHC 11/07/2016 33.7  32.0 - 36.0 g/dL Final  . RDW 11/07/2016 14.2  11.0 - 15.0 % Final  . Platelets 11/07/2016 299  140 - 400 K/uL Final  . MPV 11/07/2016 10.4  7.5 - 12.5 fL Final  . Neutro Abs 11/07/2016 2970  1,500 - 7,800 cells/uL Final  . Lymphs Abs 11/07/2016 1728  850 - 3,900 cells/uL Final  . Monocytes Absolute 11/07/2016 540  200 - 950 cells/uL Final  . Eosinophils Absolute 11/07/2016 162  15 - 500 cells/uL Final  . Basophils Absolute 11/07/2016 0  0 - 200 cells/uL Final  . Neutrophils Relative % 11/07/2016 55  % Final  . Lymphocytes Relative 11/07/2016 32  % Final  . Monocytes Relative 11/07/2016 10  % Final  . Eosinophils Relative 11/07/2016 3  % Final  . Basophils Relative 11/07/2016 0  % Final  . Smear Review 11/07/2016 Criteria for review not met   Final  . Sodium  11/07/2016 141  135 - 146 mmol/L Final  . Potassium 11/07/2016 4.2  3.5 - 5.3 mmol/L Final  . Chloride 11/07/2016 106  98 - 110 mmol/L Final  . CO2 11/07/2016 26  20 - 31 mmol/L Final  . Glucose, Bld 11/07/2016 113* 65 - 99 mg/dL Final  . BUN 11/07/2016 12  7 - 25 mg/dL Final  . Creat 11/07/2016 0.97  0.70 - 1.25 mg/dL Final   Comment:   For patients > or = 64 years of age: The upper reference limit for Creatinine is approximately 13% higher for people identified as African-American.     . Total Bilirubin 11/07/2016 0.5  0.2 - 1.2 mg/dL Final  . Alkaline Phosphatase 11/07/2016 50  40 - 115  U/L Final  . AST 11/07/2016 20  10 - 35 U/L Final  . ALT 11/07/2016 22  9 - 46 U/L Final  . Total Protein 11/07/2016 6.4  6.1 - 8.1 g/dL Final  . Albumin 11/07/2016 3.9  3.6 - 5.1 g/dL Final  . Calcium 11/07/2016 9.5  8.6 - 10.3 mg/dL Final  . GFR, Est African American 11/07/2016 >89  >=60 mL/min Final  . GFR, Est Non African American 11/07/2016 82  >=60 mL/min Final  . Lithium Lvl 11/07/2016 0.8  0.6 - 1.2 mmol/L Final   ** Please note change in unit of measure and reference range(s). **  Clinical Support on 11/01/2016  Component Date Value Ref Range Status  . FVC-Pre 11/01/2016 5.11  L Final  . FVC-%Pred-Pre 11/01/2016 118  % Final  . FVC-Post 11/01/2016 5.30  L Final  . FVC-%Pred-Post 11/01/2016 123  % Final  . FVC-%Change-Post 11/01/2016 3  % Final  . FEV1-Pre 11/01/2016 3.77  L Final  . FEV1-%Pred-Pre 11/01/2016 117  % Final  . FEV1-Post 11/01/2016 4.00  L Final  . FEV1-%Pred-Post 11/01/2016 124  % Final  . FEV1-%Change-Post 11/01/2016 6  % Final  . FEV6-Pre 11/01/2016 5.00  L Final  . FEV6-%Pred-Pre 11/01/2016 122  % Final  . FEV6-Post 11/01/2016 5.24  L Final  . FEV6-%Pred-Post 11/01/2016 128  % Final  . FEV6-%Change-Post 11/01/2016 4  % Final  . Pre FEV1/FVC ratio 11/01/2016 74  % Final  . FEV1FVC-%Pred-Pre 11/01/2016 98  % Final  . Post FEV1/FVC ratio 11/01/2016 75  % Final  .  FEV1FVC-%Change-Post 11/01/2016 2  % Final  . Pre FEV6/FVC Ratio 11/01/2016 98  % Final  . FEV6FVC-%Pred-Pre 11/01/2016 103  % Final  . Post FEV6/FVC ratio 11/01/2016 99  % Final  . FEV6FVC-%Pred-Post 11/01/2016 104  % Final  . FEV6FVC-%Change-Post 11/01/2016 0  % Final  . FEF 25-75 Pre 11/01/2016 2.90  L/sec Final  . FEF2575-%Pred-Pre 11/01/2016 112  % Final  . FEF 25-75 Post 11/01/2016 3.49  L/sec Final  . FEF2575-%Pred-Post 11/01/2016 135  % Final  . FEF2575-%Change-Post 11/01/2016 20  % Final  . RV 11/01/2016 3.33  L Final  . RV % pred 11/01/2016 149  % Final  . TLC 11/01/2016 8.70  L Final  . TLC % pred 11/01/2016 131  % Final  . DLCO unc 11/01/2016 29.51  ml/min/mmHg Final  . DLCO unc % pred 11/01/2016 99  % Final  . DLCO cor 11/01/2016 32.79  ml/min/mmHg Final  . DLCO cor % pred 11/01/2016 110  % Final  . DL/VA 11/01/2016 4.23  ml/min/mmHg/L Final  . DL/VA % pred 11/01/2016 94  % Final  Orders Only on 08/24/2016  Component Date Value Ref Range Status  . WBC 08/24/2016 7.1  3.8 - 10.8 K/uL Final  . RBC 08/24/2016 4.50  4.20 - 5.80 MIL/uL Final  . Hemoglobin 08/24/2016 14.4  13.2 - 17.1 g/dL Final  . HCT 08/24/2016 44.4  38.5 - 50.0 % Final  . MCV 08/24/2016 98.7  80.0 - 100.0 fL Final  . MCH 08/24/2016 32.0  27.0 - 33.0 pg Final  . MCHC 08/24/2016 32.4  32.0 - 36.0 g/dL Final  . RDW 08/24/2016 13.5  11.0 - 15.0 % Final  . Platelets 08/24/2016 302  140 - 400 K/uL Final  . MPV 08/24/2016 10.2  7.5 - 12.5 fL Final  . Neutro Abs 08/24/2016 3976  1,500 - 7,800 cells/uL Final  . Lymphs Abs 08/24/2016 2343  850 - 3,900 cells/uL Final  . Monocytes Absolute 08/24/2016 639  200 - 950 cells/uL Final  . Eosinophils Absolute 08/24/2016 142  15 - 500 cells/uL Final  . Basophils Absolute 08/24/2016 0  0 - 200 cells/uL Final  . Neutrophils Relative % 08/24/2016 56  % Final  . Lymphocytes Relative 08/24/2016 33  % Final  . Monocytes Relative 08/24/2016 9  % Final  . Eosinophils Relative  08/24/2016 2  % Final  . Basophils Relative 08/24/2016 0  % Final  . Smear Review 08/24/2016 Criteria for review not met   Final  . Sodium 08/24/2016 141  135 - 146 mmol/L Final  . Potassium 08/24/2016 3.9  3.5 - 5.3 mmol/L Final  . Chloride 08/24/2016 103  98 - 110 mmol/L Final  . CO2 08/24/2016 28  20 - 31 mmol/L Final  . Glucose, Bld 08/24/2016 89  65 - 99 mg/dL Final  . BUN 08/24/2016 14  7 - 25 mg/dL Final  . Creat 08/24/2016 0.92  0.70 - 1.25 mg/dL Final   Comment:   For patients > or = 65 years of age: The upper reference limit for Creatinine is approximately 13% higher for people identified as African-American.     . Total Bilirubin 08/24/2016 0.6  0.2 - 1.2 mg/dL Final  . Alkaline Phosphatase 08/24/2016 51  40 - 115 U/L Final  . AST 08/24/2016 24  10 - 35 U/L Final  . ALT 08/24/2016 23  9 - 46 U/L Final  . Total Protein 08/24/2016 6.8  6.1 - 8.1 g/dL Final  . Albumin 08/24/2016 4.3  3.6 - 5.1 g/dL Final  . Calcium 08/24/2016 9.9  8.6 - 10.3 mg/dL Final  . GFR, Est African American 08/24/2016 >89  >=60 mL/min Final  . GFR, Est Non African American 08/24/2016 88  >=60 mL/min Final  Lab on 08/24/2016  Component Date Value Ref Range Status  . Interferon Gamma Release Assay 08/24/2016 NEGATIVE  NEGATIVE Final   Negative test result. M. tuberculosis complex infection unlikely.  . Quantiferon Nil Value 08/24/2016 0.04  IU/mL Final  . Mitogen-Nil 08/24/2016 7.79  IU/mL Final  . Quantiferon Tb Ag Minus Nil Value 08/24/2016 0.01  IU/mL Final   Comment:   The Nil tube value is used to determine if the patient has a preexisting immune response which could cause a false-positive reading on the test. In order for a test to be valid, the Nil tube must have a value of less than or equal to 8.0 IU/mL.   The mitogen control tube is used to assure the patient has a healthy immune status and also serves as a control for correct blood handling and incubation. It is used to detect  false-negative readings. The mitogen tube must have a gamma interferon value of greater than or equal to 0.5 IU/mL higher than the value of the Nil tube.   The TB antigen tube is coated with the M. tuberculosis specific antigens. For a test to be considered positive, the TB antigen tube value minus the Nil tube value must be greater than or equal to 0.35 IU/mL.   For additional information, please refer to http://education.questdiagnostics.com/faq/QFT (This link is being provided for informational/educational purposes only.)      Imaging: No results found.  Speciality Comments: No specialty comments available.    Procedures:  No procedures performed Allergies: Simponi [golimumab]; Aspirin; Sulfonamide derivatives; and Theophyllines   Assessment / Plan:     Visit Diagnoses: Rheumatoid arthritis of multiple sites  without rheumatoid factor ([place]): He had no synovitis on examination today.  High risk medications (not anticoagulants) long-term useHe is on Humira and methotrexate - Plan: CBC with Differential/Platelet, COMPLETE METABOLIC PANEL WITH GFR his labs have been stable.  Primary osteoarthritis of both hands: Some stiffness  Bilateral hand pain - Consistent with osteoarthritis    Orders: No orders of the defined types were placed in this encounter.  No orders of the defined types were placed in this encounter.   Face-to-face time spent with patient was 30 minutes. 50% of time was spent in counseling and coordination of care.  Follow-Up Instructions: Return in about 5 months (around 07/18/2017) for RA,Humira q 2 wks, mtx 5/wk, oa hands, medicare eiligile nov 2018. Patient had no synovitis on examination today. He is concerned about changing his insurance in November. He is on Humira currently. He still believes that he will have better coverage with IV medications in future. We discussed different treatment options and their side effects and he might be interested in  doing IV Orencia in future.  Bo Merino, MD  Note - This record has been created using Editor, commissioning.  Chart creation errors have been sought, but may not always  have been located. Such creation errors do not reflect on  the standard of medical care.

## 2017-02-15 NOTE — Patient Instructions (Addendum)
Standing Labs We placed an order today for your standing lab work.    Please come back and get your standing labs in August and November (in November 2018, you'll be due for her annual TB gold.)  We have open lab Monday through Friday from 8:30-11:30 AM and 1:30-4 PM at the office of Dr. Tresa Moore, PA.   The office is located at [address] [place], [place], [place] [address] No appointment is necessary.   Labs are drawn by [facility].  You may receive a bill from [facility] for your lab work.     ===================  Abatacept solution for injection (subcutaneous or intravenous use) What is this medicine? ABATACEPT (a ba TA sept) is used to treat moderate to severe active rheumatoid arthritis or psoriatic arthritis in adults. This medicine is also used to treat juvenile idiopathic arthritis. This medicine may be used for other purposes; ask your health care provider or pharmacist if you have questions. COMMON BRAND NAME(S): Orencia What should I tell my health care provider before I take this medicine? They need to know if you have any of these conditions: -are taking other medicines to treat rheumatoid arthritis -COPD -diabetes -infection or history of infections -recently received or scheduled to receive a vaccine -scheduled to have surgery -tuberculosis, a positive skin test for tuberculosis or have recently been in close contact with someone who has tuberculosis -viral hepatitis -an unusual or allergic reaction to abatacept, other medicines, foods, dyes, or preservatives -pregnant or trying to get pregnant -breast-feeding How should I use this medicine? This medicine is for infusion into a vein or for injection under the skin. Infusions are given by a health care professional in a hospital or clinic setting. If you are to give your own medicine at home, you will be taught how to prepare and give this medicine under the skin. Use exactly as directed. Take  your medicine at regular intervals. Do not take your medicine more often than directed. It is important that you put your used needles and syringes in a special sharps container. Do not put them in a trash can. If you do not have a sharps container, call your pharmacist or healthcare provider to get one. Talk to your pediatrician regarding the use of this medicine in children. While infusions in a clinic may be prescribed for children as young as 2 years for selected conditions, precautions do apply. Overdosage: If you think you have taken too much of this medicine contact a poison control center or emergency room at once. NOTE: This medicine is only for you. Do not share this medicine with others. What if I miss a dose? This medicine is used once a week if given by injection under the skin. If you miss a dose, take it as soon as you can. If it is almost time for your next dose, take only that dose. Do not take double or extra doses. If you are to be given an infusion, it is important not to miss your dose. Doses are usually every 4 weeks. Call your doctor or health care professional if you are unable to keep an appointment. What may interact with this medicine? Do not take this medicine with any of the following medications: -adalimumab -anakinra -certolizumab -etanercept -golimumab -infliximab -live virus vaccines -rituximab -tocilizumab This medicine may also interact with the following medications: -vaccines This list may not describe all possible interactions. Give your health care provider a list of all the medicines, herbs, non-prescription drugs, or  dietary supplements you use. Also tell them if you smoke, drink alcohol, or use illegal drugs. Some items may interact with your medicine. What should I watch for while using this medicine? Visit your doctor for regular check ups while you are taking this medicine. Tell your doctor or healthcare professional if your symptoms do not start to  get better or if they get worse. Call your doctor or health care professional if you get a cold or other infection while receiving this medicine. Do not treat yourself. This medicine may decrease your body's ability to fight infection. Try to avoid being around people who are sick. What side effects may I notice from receiving this medicine? Side effects that you should report to your doctor or health care professional as soon as possible: -allergic reactions like skin rash, itching or hives, swelling of the face, lips, or tongue -breathing problems -chest pain -signs of infection - fever or chills, cough, unusual tiredness, pain or trouble passing urine, or warm, red or painful skin Side effects that usually do not require medical attention (report to your doctor or health care professional if they continue or are bothersome): -dizziness -headache -nausea, vomiting -sore throat -stomach upset This list may not describe all possible side effects. Call your doctor for medical advice about side effects. You may report side effects to FDA at 1-800-FDA-1088. Where should I keep my medicine? Infusions will be given in a hospital or clinic and will not be stored at home. Storage for syringes given under the skin and stored at home: Keep out of the reach of children. Store in a refrigerator between 2 and 8 degrees C (36 and 46 degrees F). Keep this medicine in the original container. Protect from light. Do not freeze. Throw away any unused medicine after the expiration date. NOTE: This sheet is a summary. It may not cover all possible information. If you have questions about this medicine, talk to your doctor, pharmacist, or health care provider.  2018 Elsevier/Gold Standard (2016-04-12 10:07:35)

## 2017-02-18 NOTE — Progress Notes (Signed)
WNL

## 2017-02-27 ENCOUNTER — Other Ambulatory Visit: Payer: Self-pay | Admitting: Rheumatology

## 2017-02-27 NOTE — Telephone Encounter (Signed)
Last Visit: 02/15/17 Next Visit: 07/17/17 Labs: 02/15/17 WNL TB Gold: 08/2016 Neg   Okay to refill Humira?

## 2017-02-27 NOTE — Telephone Encounter (Signed)
ok 

## 2017-03-05 ENCOUNTER — Other Ambulatory Visit: Payer: Self-pay | Admitting: Internal Medicine

## 2017-04-16 DIAGNOSIS — R7301 Impaired fasting glucose: Secondary | ICD-10-CM | POA: Diagnosis not present

## 2017-04-16 DIAGNOSIS — Z125 Encounter for screening for malignant neoplasm of prostate: Secondary | ICD-10-CM | POA: Diagnosis not present

## 2017-04-16 DIAGNOSIS — Z0000 Encounter for general adult medical examination without abnormal findings: Secondary | ICD-10-CM | POA: Diagnosis not present

## 2017-04-16 DIAGNOSIS — F319 Bipolar disorder, unspecified: Secondary | ICD-10-CM | POA: Diagnosis not present

## 2017-04-16 DIAGNOSIS — Z1159 Encounter for screening for other viral diseases: Secondary | ICD-10-CM | POA: Diagnosis not present

## 2017-04-16 DIAGNOSIS — F419 Anxiety disorder, unspecified: Secondary | ICD-10-CM | POA: Diagnosis not present

## 2017-04-16 DIAGNOSIS — F322 Major depressive disorder, single episode, severe without psychotic features: Secondary | ICD-10-CM | POA: Diagnosis not present

## 2017-04-16 DIAGNOSIS — I10 Essential (primary) hypertension: Secondary | ICD-10-CM | POA: Diagnosis not present

## 2017-04-16 DIAGNOSIS — M069 Rheumatoid arthritis, unspecified: Secondary | ICD-10-CM | POA: Diagnosis not present

## 2017-04-16 DIAGNOSIS — E230 Hypopituitarism: Secondary | ICD-10-CM | POA: Diagnosis not present

## 2017-04-16 DIAGNOSIS — J45909 Unspecified asthma, uncomplicated: Secondary | ICD-10-CM | POA: Diagnosis not present

## 2017-04-20 ENCOUNTER — Other Ambulatory Visit: Payer: Self-pay | Admitting: Internal Medicine

## 2017-04-22 ENCOUNTER — Telehealth: Payer: Self-pay | Admitting: Internal Medicine

## 2017-04-22 MED ORDER — ALBUTEROL SULFATE HFA 108 (90 BASE) MCG/ACT IN AERS: INHALATION_SPRAY | RESPIRATORY_TRACT | 5 refills | Status: DC

## 2017-04-22 NOTE — Telephone Encounter (Signed)
Spoke with pt's fiance, Beth. She is not listed on the pt's DPR. NO MEDICAL INFORMATION WAS RELEASED. She states that pt needs a refill on Proair. Rx has been sent in. Nothing further was needed.

## 2017-04-23 ENCOUNTER — Other Ambulatory Visit: Payer: Self-pay | Admitting: Internal Medicine

## 2017-04-26 ENCOUNTER — Other Ambulatory Visit: Payer: Self-pay | Admitting: Internal Medicine

## 2017-04-29 ENCOUNTER — Telehealth: Payer: Self-pay | Admitting: Internal Medicine

## 2017-04-29 MED ORDER — AMOXICILLIN-POT CLAVULANATE 875-125 MG PO TABS
1.0000 | ORAL_TABLET | Freq: Two times a day (BID) | ORAL | 0 refills | Status: DC
Start: 2017-04-29 — End: 2017-10-22

## 2017-04-29 NOTE — Telephone Encounter (Signed)
Offer augmentin 875 mg, # 20     1 twice daily x 10 days

## 2017-04-29 NOTE — Telephone Encounter (Signed)
Pt c/o URI - yellow mucus from head and chest, congestion in chest, coughing, SOB and chest tightness.  Pt states that symptoms started about 1 week ago.  Pt has tried Sudafed and Mucinex OTC but does not seem to be getting much relief. Denies fever.   Please advise Dr Annamaria Boots.   Allergies as of 04/29/2017      Reactions   Simponi [golimumab]    Repeated infections   Aspirin    REACTION: throat swelling   Sulfonamide Derivatives    REACTION: oral sores   Theophyllines       Medication List       Accurate as of 04/29/17 11:08 AM. Always use your most recent med list.          albuterol 108 (90 Base) MCG/ACT inhaler Commonly known as:  PROAIR HFA INHALE TWO PUFFS BY MOUTH EVERY 4 HOURS   clonazePAM 1 MG tablet Commonly known as:  KLONOPIN Take 1 tablet by mouth daily.   fluticasone 50 MCG/ACT nasal spray Commonly known as:  FLONASE Place 1 spray into both nostrils daily.   fluticasone furoate-vilanterol 200-25 MCG/INH Aepb Commonly known as:  BREO ELLIPTA Inhale 1 puff into the lungs daily.   folic acid 1 MG tablet Commonly known as:  FOLVITE Take 2 tablets (2 mg total) by mouth daily.   HUMIRA PEN 40 MG/0.8ML Pnkt Generic drug:  Adalimumab INJECT 1 PEN (40 MG) UNDER THE SKIN EVERY OTHER WEEK   lithium carbonate 300 MG capsule Take 300 mg by mouth 3 (three) times daily.   methotrexate 2.5 MG tablet Commonly known as:  RHEUMATREX Take 12.5 mg by mouth once a week. Caution:Chemotherapy. Protect from light.   montelukast 10 MG tablet Commonly known as:  SINGULAIR TAKE 1 TABLET DAILY   multivitamin tablet Take 1 tablet by mouth daily.   oxymetazoline 0.05 % nasal spray Commonly known as:  AFRIN Place 2 sprays into the nose daily as needed.   sildenafil 100 MG tablet Commonly known as:  VIAGRA Take 100 mg by mouth daily as needed.   Testosterone 20.25 MG/ACT (1.62%) Gel Apply 2 pumps as directed once a day

## 2017-04-29 NOTE — Telephone Encounter (Signed)
Pt aware of rec's per CY Rx sent to [facility] per pt request.  Nothing further needed.

## 2017-05-01 ENCOUNTER — Telehealth: Payer: Self-pay | Admitting: Internal Medicine

## 2017-05-01 MED ORDER — DOXYCYCLINE HYCLATE 100 MG PO TABS: 100.0000 mg | ORAL_TABLET | Freq: Two times a day (BID) | ORAL | 0 refills | Status: DC

## 2017-05-01 NOTE — Telephone Encounter (Signed)
Spoke with pt and made him aware of CY's message. Pt is aware to not continue to take the augmentin and agreed to try the new medication. This rx was sent to [facility] per his request. He had no additional questions at this time. Nothing further is needed    FYI to  Nurse: When ordering the Cefdinir it came up with an allergy to the augmentin/amoxcillin. Spoke verbally with CY in the office and he verbally gave the order to switch pt to doxy 100mg  BID #[id number].

## 2017-05-01 NOTE — Telephone Encounter (Signed)
DC augmentin  Offer cefdinir 300 mg, # 20, 1 twice daily

## 2017-05-01 NOTE — Telephone Encounter (Signed)
CY  Please Advise-Sick Message  Pt states that the augmentin that was called in for him on 7/23 is causing him severe diarrhea and vomiting.  He states his breathing is doing ok, it has somewhat improved.  Augmentin has been added to his allergy list. Please advise what else you would like to call in.  Allergies  Allergen Reactions  . Simponi [Golimumab]     Repeated infections  . Aspirin     REACTION: throat swelling  . Augmentin [Amoxicillin-Pot Clavulanate] Diarrhea and Nausea And Vomiting  . Sulfonamide Derivatives     REACTION: oral sores  . Theophyllines

## 2017-05-02 ENCOUNTER — Telehealth: Payer: Self-pay | Admitting: Internal Medicine

## 2017-05-02 NOTE — Telephone Encounter (Signed)
CY  Please Advise-  Pt called back and stated that he took the doxy prescribed yesterday and he states he is still having diarrhea. He states this has not improved. He states he had c-diff some years ago and wanted your opinion if you think this could possibly be this or something else.  Allergies  Allergen Reactions  . Simponi [Golimumab]     Repeated infections  . Aspirin     REACTION: throat swelling  . Augmentin [Amoxicillin-Pot Clavulanate] Diarrhea and Nausea And Vomiting  . Sulfonamide Derivatives     REACTION: oral sores  . Theophyllines

## 2017-05-02 NOTE — Telephone Encounter (Signed)
It could be C diff, but of course we hope not.  Suggest stop antibiotics at least through weekend. He should call his PCP about leaving a stool specimen for Cdiff assay.

## 2017-05-02 NOTE — Telephone Encounter (Signed)
Pt aware of rec's per CY Nothing further needed. 

## 2017-05-03 DIAGNOSIS — R197 Diarrhea, unspecified: Secondary | ICD-10-CM | POA: Diagnosis not present

## 2017-05-06 DIAGNOSIS — A084 Viral intestinal infection, unspecified: Secondary | ICD-10-CM | POA: Diagnosis not present

## 2017-05-22 ENCOUNTER — Other Ambulatory Visit: Payer: Self-pay | Admitting: Rheumatology

## 2017-05-22 NOTE — Telephone Encounter (Signed)
Pt states he will come by tomorrow for labs

## 2017-05-22 NOTE — Telephone Encounter (Signed)
02/15/17 last visit  07/16/17 next visit   CBC Latest Ref Rng & Units 02/15/2017 11/07/2016 08/24/2016  WBC 3.8 - 10.8 K/uL 6.3 5.4 7.1  Hemoglobin 13.2 - 17.1 g/dL 14.3 13.9 14.4  Hematocrit 38.5 - 50.0 % 43.6 41.2 44.4  Platelets 140 - 400 K/uL 307 299 302   CMP Latest Ref Rng & Units 02/15/2017 11/07/2016 08/24/2016  Glucose 65 - 99 mg/dL 84 113(H) 89  BUN 7 - 25 mg/dL 12 12 14   Creatinine 0.70 - 1.25 mg/dL 1.02 0.97 0.92  Sodium 135 - 146 mmol/L 141 141 141  Potassium 3.5 - 5.3 mmol/L 3.9 4.2 3.9  Chloride 98 - 110 mmol/L 105 106 103  CO2 20 - 31 mmol/L 26 26 28   Calcium 8.6 - 10.3 mg/dL 9.3 9.5 9.9  Total Protein 6.1 - 8.1 g/dL 6.5 6.4 6.8  Total Bilirubin 0.2 - 1.2 mg/dL 0.3 0.5 0.6  Alkaline Phos 40 - 115 U/L 59 50 51  AST 10 - 35 U/L 25 20 24   ALT 9 - 46 U/L 32 22 23   TB gold negative in November  Called patient CBC CMP are due now.

## 2017-05-23 ENCOUNTER — Other Ambulatory Visit: Payer: Self-pay

## 2017-05-23 ENCOUNTER — Other Ambulatory Visit: Payer: Self-pay | Admitting: Radiology

## 2017-05-23 DIAGNOSIS — Z79899 Other long term (current) drug therapy: Secondary | ICD-10-CM

## 2017-05-23 NOTE — Telephone Encounter (Signed)
Patient came by for the labs today  Ok to refill per Dr Estanislado Pandy

## 2017-05-24 LAB — COMPLETE METABOLIC PANEL WITH GFR
ALBUMIN: 4.3 g/dL (ref 3.6–5.1)
ALT: 24 U/L (ref 9–46)
AST: 20 U/L (ref 10–35)
Alkaline Phosphatase: 60 U/L (ref 40–115)
BILIRUBIN TOTAL: 0.6 mg/dL (ref 0.2–1.2)
BUN: 16 mg/dL (ref 7–25)
CALCIUM: 9.9 mg/dL (ref 8.6–10.3)
CO2: 22 mmol/L (ref 20–32)
CREATININE: 1.03 mg/dL (ref 0.70–1.25)
Chloride: 104 mmol/L (ref 98–110)
GFR, EST AFRICAN AMERICAN: 88 mL/min (ref >=60)
GFR, Est Non African American: 76 mL/min (ref >=60)
Glucose, Bld: 137 mg/dL — ABNORMAL HIGH (ref 65–99)
Potassium: 4 mmol/L (ref 3.5–5.3)
Sodium: 143 mmol/L (ref 135–146)
TOTAL PROTEIN: 6.8 g/dL (ref 6.1–8.1)

## 2017-05-24 LAB — LITHIUM LEVEL: LITHIUM LVL: 0.7 mmol/L (ref 0.6–1.2)

## 2017-05-24 NOTE — Progress Notes (Signed)
Please let Pt know and fax to his PCP.

## 2017-06-11 ENCOUNTER — Telehealth: Payer: Self-pay | Admitting: Rheumatology

## 2017-06-11 NOTE — Telephone Encounter (Signed)
Attempted to contact the patient and left message for patient to call the office.  

## 2017-06-11 NOTE — Telephone Encounter (Signed)
Patient left a message stating he wants to confirm his medication is suppose to change to [place] due to insurance change. Please call patient to inform.

## 2017-06-12 NOTE — Telephone Encounter (Signed)
Patient states he signed up for his new insurance yesterday. Patient states he can no longer afford the Humira. Patient had discussed Orencia infusions with Dr. Koleen Nimrod at his last visit. Patient states he has enough medication to last until his follow up appointment.  FYI

## 2017-06-12 NOTE — Telephone Encounter (Signed)
Patient left a message on voicemail that he was returning your call.

## 2017-06-17 DIAGNOSIS — E230 Hypopituitarism: Secondary | ICD-10-CM | POA: Diagnosis not present

## 2017-06-17 DIAGNOSIS — R5382 Chronic fatigue, unspecified: Secondary | ICD-10-CM | POA: Diagnosis not present

## 2017-06-19 DIAGNOSIS — Z23 Encounter for immunization: Secondary | ICD-10-CM | POA: Diagnosis not present

## 2017-06-20 DIAGNOSIS — E230 Hypopituitarism: Secondary | ICD-10-CM | POA: Diagnosis not present

## 2017-07-05 ENCOUNTER — Telehealth: Payer: Self-pay | Admitting: Internal Medicine

## 2017-07-05 NOTE — Telephone Encounter (Signed)
We are working here with 4 different biologic injectable agents for asthma that can really help qualified people. These have already been released by the FDA for clinical use. I suspect the agent being treated at [facility] is another one still in the testing phase.  [name] [name] could certainly call the doctor at [facility] and ask if they are still enrolling in their study, and if so, would they like to see him.  If he wants to look into what we already have, please order labs- CBC w diff, Allergy Profile with Total IgE

## 2017-07-05 NOTE — Telephone Encounter (Signed)
Spoke with pt, he wanted to see if CY thought about this program for asthma at [facility]. He states they are testing a protein on humans that will get rid of asthma and wanted to know if he thought it was worth doing. Also, if he could get him in the program and I tried looking up information online about this trial. CY do you know about this? Please advise.

## 2017-07-05 NOTE — Telephone Encounter (Signed)
Left message with wife to have patient call back

## 2017-07-08 DIAGNOSIS — L111 Transient acantholytic dermatosis [Grover]: Secondary | ICD-10-CM

## 2017-07-08 DIAGNOSIS — Z8709 Personal history of other diseases of the respiratory system: Secondary | ICD-10-CM | POA: Insufficient documentation

## 2017-07-08 HISTORY — DX: Transient acantholytic dermatosis (grover): L11.1

## 2017-07-08 NOTE — Progress Notes (Signed)
Office Visit Note  Patient: [name] [name]             Date of Birth: 1951/03/07           MRN: [id number]             PCP: Wenda Low, MD Referring: Wenda Low, MD Visit Date: 07/16/2017 Occupation: @GUAROCC @    Subjective:  Joint stiffness   History of Present Illness: [name] [name] is a 66 y.o. male with history of seronegative rheumatoid arthritis. He states he is doing quite well on combination of Humira and methotrexate. He has some stiffness in the morning but no joint swelling. He recently lost his dog and went through some difficult time.  Activities of Daily Living:  Patient reports morning stiffness for 5  minute.   Patient Denies nocturnal pain.  Difficulty dressing/grooming: Reports Difficulty climbing stairs: Denies Difficulty getting out of chair: Reports Difficulty using hands for taps, buttons, cutlery, and/or writing: Reports   Review of Systems  Constitutional: Negative for fatigue, night sweats and weakness ( ).  HENT: Negative for mouth sores, mouth dryness and nose dryness.   Eyes: Negative for redness and dryness.  Respiratory: Positive for shortness of breath. Negative for cough and difficulty breathing.        History of asthma  Cardiovascular: Negative for chest pain, palpitations, hypertension, irregular heartbeat and swelling in legs/feet.  Gastrointestinal: Positive for diarrhea. Negative for blood in stool and constipation.  Endocrine: Negative for increased urination.  Genitourinary: Negative for hematuria and urgency.  Musculoskeletal: Positive for arthralgias, joint pain and morning stiffness. Negative for joint swelling, myalgias, muscle weakness, muscle tenderness and myalgias.  Skin: Negative for color change, rash, hair loss, nodules/bumps, skin tightness, ulcers and sensitivity to sunlight.  Allergic/Immunologic: Positive for susceptible to infections.  Neurological: Positive for memory loss. Negative for dizziness, fainting,  light-headedness, numbness and night sweats.  Hematological: Negative for swollen glands.  Psychiatric/Behavioral: Negative for depressed mood and sleep disturbance. The patient is nervous/anxious.     PMFS History:  Patient Active Problem List   Diagnosis Date Noted  . History of depression 07/15/2017  . History of asthma 07/08/2017  . 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 Asthmatic bronchitis, mild intermittent, uncomplicated 32/20/2542  . Asthmatic bronchitis with acute exacerbation 10/15/2016  . Herpes zoster 08/01/2013  . Rheumatoid arthritis of multiple sites without rheumatoid factor ([place]) 09/18/2010  . NASAL POLYP 12/24/2007  . Sinusitis, chronic 12/24/2007  . Seasonal and perennial allergic rhinitis 12/24/2007  . Allergic-infective asthma 12/24/2007  . G E R D 12/24/2007    Past Medical History:  Diagnosis Date  . ALLERGIC RHINITIS   . Asthma   . GERD (gastroesophageal reflux disease)   . Rheumatoid arthritis(714.0)     Family History  Problem Relation Age of Onset  . Multiple sclerosis Father   . Breast cancer Mother   . Breast cancer Sister    Past Surgical History:  Procedure Laterality Date  . extensive sinus surgery with ablation of the frontal sinuses    . HERNIA REPAIR    . nasal polypectomies     Social History   Social History Narrative  . No narrative on file     Objective: Vital Signs: BP (!) 142/79   Pulse 85   Resp 14   Ht 5\' 6"  (1.676 m)   Wt 168 lb (76.2 kg)   BMI 27.12 kg/m    Physical Exam  Constitutional: He is oriented to person,  place, and time. He appears well-developed and well-nourished.  HENT:  Head: Normocephalic and atraumatic.  Eyes: Pupils are equal, round, and reactive to light. Conjunctivae and EOM are normal.  Neck: Normal range of motion. Neck supple.  Cardiovascular: Normal rate, regular rhythm and normal heart sounds.   Pulmonary/Chest: Effort normal and breath sounds normal.  Abdominal:  Soft. Bowel sounds are normal.  Neurological: He is alert and oriented to person, place, and time.  Skin: Skin is warm and dry. Capillary refill takes less than 2 seconds.  Psychiatric: He has a normal mood and affect. His behavior is normal.  Nursing note and vitals reviewed.    Musculoskeletal Exam: C-spine and thoracic lumbar spine good range of motion. Shoulder joints elbow joints were good range of motion. His subluxation of all MCP joints with synovial thickening and incomplete fist formation and incomplete extension. Hip joints knee joints ankles MTPs PIPs with good range of motion with no synovitis.  CDAI Exam: CDAI Homunculus Exam:   Joint Counts:  CDAI Tender Joint count: 0 CDAI Swollen Joint count: 0  Global Assessments:  Patient Global Assessment: 0 Provider Global Assessment: 0    Investigation: Findings:  08/24/16 negative TB gold   CBC Latest Ref Rng & Units 02/15/2017 11/07/2016 08/24/2016  WBC 3.8 - 10.8 K/uL 6.3 5.4 7.1  Hemoglobin 13.2 - 17.1 g/dL 14.3 13.9 14.4  Hematocrit 38.5 - 50.0 % 43.6 41.2 44.4  Platelets 140 - 400 K/uL 307 299 302   CMP Latest Ref Rng & Units 05/23/2017 02/15/2017 11/07/2016  Glucose 65 - 99 mg/dL 137(H) 84 113(H)  BUN 7 - 25 mg/dL 16 12 12   Creatinine 0.70 - 1.25 mg/dL 1.03 1.02 0.97  Sodium 135 - 146 mmol/L 143 141 141  Potassium 3.5 - 5.3 mmol/L 4.0 3.9 4.2  Chloride 98 - 110 mmol/L 104 105 106  CO2 20 - 32 mmol/L 22 26 26   Calcium 8.6 - 10.3 mg/dL 9.9 9.3 9.5  Total Protein 6.1 - 8.1 g/dL 6.8 6.5 6.4  Total Bilirubin 0.2 - 1.2 mg/dL 0.6 0.3 0.5  Alkaline Phos 40 - 115 U/L 60 59 50  AST 10 - 35 U/L 20 25 20   ALT 9 - 46 U/L 24 32 22    Imaging: No results found.  Speciality Comments: No specialty comments available.    Procedures:  No procedures performed Allergies: Simponi [golimumab]; Aspirin; Augmentin [amoxicillin-pot clavulanate]; Sulfonamide derivatives; and Theophyllines   Assessment / Plan:     Visit  Diagnoses: Rheumatoid arthritis of multiple sites without rheumatoid factor (HCC) - RF-,CCP-,1433n-. His arthritis is very well controlled on Humira and methotrexate combination. Although he is concerned that he may not be continued Humira on Medicare. He wants to look at infusion options.I had detailed  discussion regarding possible IV Orencia. I would like to explore further if Humira can be continued as is working really well for him. I also discussed his pacing Humira to every 16-18 days if possible as he is clinically doing very well.  High risk medication use - Humira 40 mg subcutaneous every other week, methotrexate 5 tablets by mouth every week, folic acid 1 mg by mouth daily. His labs are past-due we will check her labs today and then every 3 months. We will check TB gold today as well because he is on long-term immunosuppressive therapy.  History of asthma: He continues to have some shortness of breath and chronic cough due to asthma.  History of gastroesophageal reflux (GERD): Controlled with  medication.  Diarrhea: Patient gives history of chronic diarrhea have advised him to discuss with PCP he may need referral to gastroenterologist.  History of IBS  Family history of MS (multiple sclerosis) Father   History of depression : His depression has increased recently due to loss of his cat.   Orders: Orders Placed This Encounter  Procedures  . Quantiferon tb gold assay (blood)   No orders of the defined types were placed in this encounter.   Face-to-face time spent with patient was 30 minutes. Greater than 50% of time was spent in counseling and coordination of care.  Follow-Up Instructions: Return in about 5 months (around 12/14/2017) for Rheumatoid arthritis.   Bo Merino, MD  Note - This record has been created using Editor, commissioning.  Chart creation errors have been sought, but may not always  have been located. Such creation errors do not reflect on  the standard of  medical care.

## 2017-07-08 NOTE — Telephone Encounter (Signed)
Called and spoke to pt. Informed him of the recs per CY. Pt states he will call [facility] and see if they are still accepting participants, pt will call back if there are any issues. Nothing further needed. Will sign off.

## 2017-07-15 DIAGNOSIS — Z8659 Personal history of other mental and behavioral disorders: Secondary | ICD-10-CM | POA: Insufficient documentation

## 2017-07-15 DIAGNOSIS — F325 Major depressive disorder, single episode, in full remission: Secondary | ICD-10-CM | POA: Insufficient documentation

## 2017-07-15 HISTORY — DX: Major depressive disorder, single episode, in full remission: F32.5

## 2017-07-16 ENCOUNTER — Telehealth: Payer: Self-pay | Admitting: Rheumatology

## 2017-07-16 ENCOUNTER — Ambulatory Visit (INDEPENDENT_AMBULATORY_CARE_PROVIDER_SITE_OTHER): Payer: Medicare Other | Admitting: Rheumatology

## 2017-07-16 ENCOUNTER — Encounter: Payer: Self-pay | Admitting: Rheumatology

## 2017-07-16 VITALS — BP 142/79 | HR 85 | Resp 14 | Ht 66.0 in | Wt 168.0 lb

## 2017-07-16 DIAGNOSIS — Z8659 Personal history of other mental and behavioral disorders: Secondary | ICD-10-CM | POA: Diagnosis not present

## 2017-07-16 DIAGNOSIS — Z8719 Personal history of other diseases of the digestive system: Secondary | ICD-10-CM

## 2017-07-16 DIAGNOSIS — M0609 Rheumatoid arthritis without rheumatoid factor, multiple sites: Secondary | ICD-10-CM | POA: Diagnosis not present

## 2017-07-16 DIAGNOSIS — Z79899 Other long term (current) drug therapy: Secondary | ICD-10-CM

## 2017-07-16 DIAGNOSIS — Z8709 Personal history of other diseases of the respiratory system: Secondary | ICD-10-CM | POA: Diagnosis not present

## 2017-07-16 DIAGNOSIS — Z820 Family history of epilepsy and other diseases of the nervous system: Secondary | ICD-10-CM

## 2017-07-16 DIAGNOSIS — R251 Tremor, unspecified: Secondary | ICD-10-CM

## 2017-07-16 NOTE — Patient Instructions (Addendum)
Abatacept solution for injection (subcutaneous or intravenous use) What is this medicine? ABATACEPT (a ba TA sept) is used to treat moderate to severe active rheumatoid arthritis or psoriatic arthritis in adults. This medicine is also used to treat juvenile idiopathic arthritis. This medicine may be used for other purposes; ask your health care provider or pharmacist if you have questions. COMMON BRAND NAME(S): Orencia What should I tell my health care provider before I take this medicine? They need to know if you have any of these conditions: -are taking other medicines to treat rheumatoid arthritis -COPD -diabetes -infection or history of infections -recently received or scheduled to receive a vaccine -scheduled to have surgery -tuberculosis, a positive skin test for tuberculosis or have recently been in close contact with someone who has tuberculosis -viral hepatitis -an unusual or allergic reaction to abatacept, other medicines, foods, dyes, or preservatives -pregnant or trying to get pregnant -breast-feeding How should I use this medicine? This medicine is for infusion into a vein or for injection under the skin. Infusions are given by a health care professional in a hospital or clinic setting. If you are to give your own medicine at home, you will be taught how to prepare and give this medicine under the skin. Use exactly as directed. Take your medicine at regular intervals. Do not take your medicine more often than directed. It is important that you put your used needles and syringes in a special sharps container. Do not put them in a trash can. If you do not have a sharps container, call your pharmacist or healthcare provider to get one. Talk to your pediatrician regarding the use of this medicine in children. While infusions in a clinic may be prescribed for children as young as 2 years for selected conditions, precautions do apply. Overdosage: If you think you have taken too much of  this medicine contact a poison control center or emergency room at once. NOTE: This medicine is only for you. Do not share this medicine with others. What if I miss a dose? This medicine is used once a week if given by injection under the skin. If you miss a dose, take it as soon as you can. If it is almost time for your next dose, take only that dose. Do not take double or extra doses. If you are to be given an infusion, it is important not to miss your dose. Doses are usually every 4 weeks. Call your doctor or health care professional if you are unable to keep an appointment. What may interact with this medicine? Do not take this medicine with any of the following medications: -adalimumab -anakinra -certolizumab -etanercept -golimumab -infliximab -live virus vaccines -rituximab -tocilizumab This medicine may also interact with the following medications: -vaccines This list may not describe all possible interactions. Give your health care provider a list of all the medicines, herbs, non-prescription drugs, or dietary supplements you use. Also tell them if you smoke, drink alcohol, or use illegal drugs. Some items may interact with your medicine. What should I watch for while using this medicine? Visit your doctor for regular check ups while you are taking this medicine. Tell your doctor or healthcare professional if your symptoms do not start to get better or if they get worse. Call your doctor or health care professional if you get a cold or other infection while receiving this medicine. Do not treat yourself. This medicine may decrease your body's ability to fight infection. Try to avoid being around people who   are sick. What side effects may I notice from receiving this medicine? Side effects that you should report to your doctor or health care professional as soon as possible: -allergic reactions like skin rash, itching or hives, swelling of the face, lips, or tongue -breathing  problems -chest pain -signs of infection - fever or chills, cough, unusual tiredness, pain or trouble passing urine, or warm, red or painful skin Side effects that usually do not require medical attention (report to your doctor or health care professional if they continue or are bothersome): -dizziness -headache -nausea, vomiting -sore throat -stomach upset This list may not describe all possible side effects. Call your doctor for medical advice about side effects. You may report side effects to FDA at 1-800-FDA-1088. Where should I keep my medicine? Infusions will be given in a hospital or clinic and will not be stored at home. Storage for syringes given under the skin and stored at home: Keep out of the reach of children. Store in a refrigerator between 2 and 8 degrees C (36 and 46 degrees F). Keep this medicine in the original container. Protect from light. Do not freeze. Throw away any unused medicine after the expiration date. NOTE: This sheet is a summary. It may not cover all possible information. If you have questions about this medicine, talk to your doctor, pharmacist, or health care provider.  2018 Elsevier/Gold Standard (2016-04-12 10:07:35) Standing Labs We placed an order today for your standing lab work.    Please come back and get your standing labs in January and every 3 months  We have open lab Monday through Friday from 8:30-11:30 AM and 1:30-4 PM at the office of Dr. Bo Merino.   The office is located at [address] [place], [place], [place] [address] No appointment is necessary.   Labs are drawn by [facility].  You may receive a bill from [facility] for your lab work. If you have any questions regarding directions or hours of operation,  please call [phone].

## 2017-07-16 NOTE — Telephone Encounter (Signed)
Patient concerned that tremors with hands is getting worse, and would like to discuss this. Please call to advise.

## 2017-07-16 NOTE — Telephone Encounter (Signed)
He should see a neurologist.

## 2017-07-16 NOTE — Telephone Encounter (Signed)
Patient states he has had the tremors for a while. Patient states they have gotten worse lately. Patient states the tremors have gotten worse in the last year. Patient states that there is nothing new that he has started. Patient states he would like to know if there is something that he can try to help control the tremors. Patient states the tremors are becoming a pain to deal with.

## 2017-07-17 ENCOUNTER — Ambulatory Visit: Payer: Medicare Other | Admitting: Rheumatology

## 2017-07-17 NOTE — Telephone Encounter (Signed)
Patient advised and referral placed to neurologist.

## 2017-07-18 LAB — QUANTIFERON TB GOLD ASSAY (BLOOD)
QUANTIFERON TB AG MINUS NIL: 0 [IU]/mL
QUANTIFERON(R)-TB GOLD: NEGATIVE
Quantiferon Nil Value: 0.05 IU/mL

## 2017-07-19 ENCOUNTER — Other Ambulatory Visit: Payer: Self-pay

## 2017-07-19 DIAGNOSIS — Z79899 Other long term (current) drug therapy: Secondary | ICD-10-CM

## 2017-07-19 LAB — COMPLETE METABOLIC PANEL WITH GFR
AG RATIO: 1.5 (calc) (ref 1.0–2.5)
ALT: 25 U/L (ref 9–46)
AST: 26 U/L (ref 10–35)
Albumin: 4.3 g/dL (ref 3.6–5.1)
Alkaline phosphatase (APISO): 59 U/L (ref 40–115)
BUN: 16 mg/dL (ref 7–25)
CALCIUM: 9.5 mg/dL (ref 8.6–10.3)
CO2: 24 mmol/L (ref 20–32)
Chloride: 104 mmol/L (ref 98–110)
Creat: 1.05 mg/dL (ref 0.70–1.25)
GFR, EST AFRICAN AMERICAN: 87 mL/min/{1.73_m2} (ref >=60)
GFR, EST NON AFRICAN AMERICAN: 75 mL/min/{1.73_m2} (ref >=60)
GLOBULIN: 2.8 g/dL (ref 1.9–3.7)
Glucose, Bld: 131 mg/dL — ABNORMAL HIGH (ref 65–99)
POTASSIUM: 3.7 mmol/L (ref 3.5–5.3)
SODIUM: 138 mmol/L (ref 135–146)
TOTAL PROTEIN: 7.1 g/dL (ref 6.1–8.1)
Total Bilirubin: 0.5 mg/dL (ref 0.2–1.2)

## 2017-07-19 LAB — CBC WITH DIFFERENTIAL/PLATELET
BASOS ABS: 61 {cells}/uL (ref 0–200)
Basophils Relative: 0.9 %
EOS ABS: 88 {cells}/uL (ref 15–500)
Eosinophils Relative: 1.3 %
HEMATOCRIT: 42.3 % (ref 38.5–50.0)
Hemoglobin: 14.6 g/dL (ref 13.2–17.1)
LYMPHS ABS: 1768 {cells}/uL (ref 850–3900)
MCH: 32.4 pg (ref 27.0–33.0)
MCHC: 34.5 g/dL (ref 32.0–36.0)
MCV: 94 fL (ref 80.0–100.0)
MPV: 10.9 fL (ref 7.5–12.5)
Monocytes Relative: 7.7 %
NEUTROS PCT: 64.1 %
Neutro Abs: 4359 cells/uL (ref 1500–7800)
PLATELETS: 309 10*3/uL (ref 140–400)
RBC: 4.5 10*6/uL (ref 4.20–5.80)
RDW: 12.5 % (ref 11.0–15.0)
TOTAL LYMPHOCYTE: 26 %
WBC: 6.8 10*3/uL (ref 3.8–10.8)
WBCMIX: 524 {cells}/uL (ref 200–950)

## 2017-07-20 NOTE — Progress Notes (Signed)
Na is low . Please fax results to his PCP.Marland Kitchen Rest is normal.

## 2017-07-31 DIAGNOSIS — Z79899 Other long term (current) drug therapy: Secondary | ICD-10-CM | POA: Diagnosis not present

## 2017-08-05 DIAGNOSIS — F329 Major depressive disorder, single episode, unspecified: Secondary | ICD-10-CM | POA: Diagnosis not present

## 2017-08-05 DIAGNOSIS — M069 Rheumatoid arthritis, unspecified: Secondary | ICD-10-CM | POA: Diagnosis not present

## 2017-08-05 DIAGNOSIS — I10 Essential (primary) hypertension: Secondary | ICD-10-CM | POA: Diagnosis not present

## 2017-08-05 DIAGNOSIS — J45909 Unspecified asthma, uncomplicated: Secondary | ICD-10-CM | POA: Diagnosis not present

## 2017-08-20 ENCOUNTER — Other Ambulatory Visit: Payer: Self-pay | Admitting: Internal Medicine

## 2017-08-21 ENCOUNTER — Other Ambulatory Visit: Payer: Self-pay

## 2017-08-21 MED ORDER — FLUTICASONE FUROATE-VILANTEROL 200-25 MCG/INH IN AEPB: 1.0000 | INHALATION_SPRAY | Freq: Every day | RESPIRATORY_TRACT | 12 refills | Status: DC

## 2017-08-21 NOTE — Telephone Encounter (Signed)
Received Rx request for Breo 200 from [facility] on [address]  Per CY refill Rx x12. Rx has been sent to preferred pharmacy. Nothing further needed.

## 2017-08-22 ENCOUNTER — Other Ambulatory Visit: Payer: Self-pay | Admitting: Internal Medicine

## 2017-08-26 ENCOUNTER — Telehealth: Payer: Self-pay | Admitting: Internal Medicine

## 2017-08-26 NOTE — Telephone Encounter (Signed)
ATC pt, no answer. Left message for pt to call back.  

## 2017-08-27 ENCOUNTER — Telehealth: Payer: Self-pay | Admitting: Internal Medicine

## 2017-08-27 ENCOUNTER — Other Ambulatory Visit: Payer: Self-pay | Admitting: Rheumatology

## 2017-08-27 ENCOUNTER — Telehealth: Payer: Self-pay

## 2017-08-27 MED ORDER — ALBUTEROL SULFATE HFA 108 (90 BASE) MCG/ACT IN AERS: 2.0000 | INHALATION_SPRAY | Freq: Four times a day (QID) | RESPIRATORY_TRACT | 5 refills | Status: DC | PRN

## 2017-08-27 NOTE — Telephone Encounter (Signed)
Spoke with pt. States that he is having issues with Proair. He feels like the medication is not properly being dispensed from the inhaler. Pt has to removed the canister of medication from the inhaler and run warm water inside the inhaler. Once he does this the inhaler works fine for a short period of time. He is requesting that we send in another type of inhaler so he does not have to keep doing this. He requests Ventolin to be sent in. Rx has been sent in. Nothing further was needed.

## 2017-08-27 NOTE — Telephone Encounter (Signed)
Last Visit: 07/16/17 Next Visit: 09/27/17 Labs: 07/19/17 Na is low. Rest is normal  Okay to refill per Dr. Estanislado Pandy

## 2017-08-27 NOTE — Telephone Encounter (Signed)
A prior authorization request for Methotrexate tablets was faxed from [employer]. Authorization submitted to patients insurance via cover my meds.   Received a confirmation that authorization has been approved from 07/28/2017 through 08/26/2020.   Case ID: [id number]  Called patient to update. Patient voices understanding and denies any questions at this time.   Laniya Friedl, [place], CPhT 1:24 PM

## 2017-08-27 NOTE — Telephone Encounter (Signed)
lmtcb X3 for pt.  Will close encounter per triage protocol.  

## 2017-08-27 NOTE — Telephone Encounter (Signed)
Patient dropped off his [employer] patient assistance application for HUMIRA. We will fax the application once the provider portion is completed. Patient voices understanding and denies any questions at this time.   Pailynn Vahey, [place], CPhT 1:38 PM

## 2017-09-05 NOTE — Telephone Encounter (Signed)
Received a fax from [place] stating that the patient has been denied. Called to clarify. Spoke to representative who states that they review applications based on the patients income and how much they would have to pay out for the medication. The patient may submit a prescription and medical expense form to have the application re-evaluated. A form was faxed to the clinic. After the form has been received, it will take 5 to 7 business day to process.   Will send document to scan center.  Called patient to update. He states that he would like to proceed with the expense form to be re-evaluated. We completed the form over the phone and the document was faxed to foundation. Will update once we receive a response. Patient voices understanding and denies any questions at this time.   Liviya Santini, [place], CPhT 8:54 AM

## 2017-09-09 NOTE — Telephone Encounter (Signed)
I had detailed discussion with patient during the last visit. IV Orencia will have lesser risk of infection compared to Simponi. Humira will not be covered by his insurance. If he is willing to take IV Orencia we can apply for it. He will have to stop Humira 2 weeks prior to Orencia dose.

## 2017-09-09 NOTE — Telephone Encounter (Signed)
Received a fax from [facility]. It states that they received and reviewed his detailed prescription and medical expense summary. They are unable to offer assistance to him since he does not meet the eligibility criteria of their program.   Will send document to scan center.   Called patient to update. He states that at his last appointment there was a discussion about starting Orenica IV due to his insurance if he was denied Humira patient assistance. He would like to consider it although he is concerned about the dosing. He was once on Simponi and though the dosing was too high for him and caused him to be sick with continuous infections. Please advise. Thanks!  Lexie Morini, [place], CPhT 2:20 PM

## 2017-09-13 ENCOUNTER — Other Ambulatory Visit: Payer: Self-pay | Admitting: Rheumatology

## 2017-09-13 NOTE — Telephone Encounter (Signed)
Last Visit: 07/16/17 Next Visit: 09/27/17 Labs: 07/19/17 Na is low. Rest is normal TB Gold: 07/16/17 Neg  Okay to refill per Dr. Estanislado Pandy

## 2017-09-13 NOTE — Telephone Encounter (Signed)
Called patient to update and see if he needed any Humira samples to hold him over until he starts Orencia IV. Did not answer phone. Left message.   Auna Mikkelsen, [place], CPhT 3:15 PM

## 2017-09-13 NOTE — Telephone Encounter (Signed)
Patient returned call. He has enough Humira to last him through December. His last injection will be given on 10/02/17. He would be able to start [place] IV around 10/15/17. He has an appointment on 12/21 to discuss Orencia IV and consent. Patient has [employer] and [employer]. No pre-certification will be required.   Gaetana Kawahara, [place], CPhT 3:37 PM

## 2017-09-17 NOTE — Progress Notes (Signed)
Office Visit Note  Patient: [name] [name]             Date of Birth: May 02, 1951           MRN: [id number]             PCP: Wenda Low, MD Referring: Wenda Low, MD Visit Date: 09/27/2017 Occupation: @GUAROCC @    Subjective:  Medication monitoring    History of Present Illness: [name] [name] is a 66 y.o. male with history of seronegative rheumatoid arthritis.  Patient is currently on Humira and MTX 5 tablets weekly.  His medicare insurance is no longer going to cover Humira, so he is here to discuss options.  He has been doing great on Humira.  He has tried Simponi in the past, which led to recurrent URIs and Shingles.  He has since had his shingles vaccine.  He denies any current joint pain, swelling, or stiffness.    Activities of Daily Living:  Patient reports morning stiffness for  0.   Patient Denies nocturnal pain.  Difficulty dressing/grooming: Denies Difficulty climbing stairs: Denies Difficulty getting out of chair: Denies Difficulty using hands for taps, buttons, cutlery, and/or writing: Reports   Review of Systems  Constitutional: Negative for fatigue, night sweats and weakness.  HENT: Positive for mouth dryness. Negative for mouth sores and nose dryness.   Eyes: Negative.  Negative for redness and dryness.  Respiratory: Positive for shortness of breath. Negative for difficulty breathing.   Cardiovascular: Negative.  Negative for chest pain, palpitations, hypertension, irregular heartbeat and swelling in legs/feet.  Gastrointestinal: Positive for diarrhea. Negative for constipation.  Endocrine: Negative.  Negative for increased urination.  Genitourinary: Negative for difficulty urinating and painful urination.  Musculoskeletal: Negative for arthralgias, joint pain, joint swelling, myalgias, muscle weakness, morning stiffness, muscle tenderness and myalgias.  Skin: Negative for color change, rash, hair loss, nodules/bumps, skin tightness, ulcers and  sensitivity to sunlight.  Allergic/Immunologic: Negative for susceptible to infections.  Neurological: Negative for dizziness, fainting, light-headedness, memory loss and night sweats.  Hematological: Negative for bruising/bleeding tendency and swollen glands.  Psychiatric/Behavioral: Negative for depressed mood and sleep disturbance. The patient is not nervous/anxious.     PMFS History:  Patient Active Problem List   Diagnosis Date Noted  . History of depression 07/15/2017  . History of asthma 07/08/2017  . 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 Asthmatic bronchitis, mild intermittent, uncomplicated 34/19/6222  . Asthmatic bronchitis with acute exacerbation 10/15/2016  . Herpes zoster 08/01/2013  . Rheumatoid arthritis of multiple sites without rheumatoid factor ([place]) 09/18/2010  . NASAL POLYP 12/24/2007  . Sinusitis, chronic 12/24/2007  . Seasonal and perennial allergic rhinitis 12/24/2007  . Allergic-infective asthma 12/24/2007  . G E R D 12/24/2007    Past Medical History:  Diagnosis Date  . ALLERGIC RHINITIS   . Asthma   . GERD (gastroesophageal reflux disease)   . Rheumatoid arthritis(714.0)     Family History  Problem Relation Age of Onset  . Multiple sclerosis Father   . Breast cancer Mother   . Breast cancer Sister    Past Surgical History:  Procedure Laterality Date  . extensive sinus surgery with ablation of the frontal sinuses    . HERNIA REPAIR    . nasal polypectomies     Social History   Social History Narrative  . Not on file     Objective: Vital Signs: BP (!) 155/80 (BP Location: Left Arm, Patient Position: Sitting)   Pulse 66  Resp 16   Ht 5\' 8"  (1.727 m)   Wt 188 lb (85.3 kg)   BMI 28.59 kg/m    Physical Exam  Constitutional: He is oriented to person, place, and time. He appears well-developed and well-nourished.  HENT:  Head: Normocephalic and atraumatic.  Eyes: Conjunctivae and EOM are normal. Pupils are equal,  round, and reactive to light.  Neck: Normal range of motion. Neck supple.  Cardiovascular: Normal rate, regular rhythm and normal heart sounds.  Pulmonary/Chest: Effort normal and breath sounds normal.  Abdominal: Soft. Bowel sounds are normal.  Neurological: He is alert and oriented to person, place, and time.  Skin: Skin is warm and dry. Capillary refill takes less than 2 seconds.  Psychiatric: He has a normal mood and affect. His behavior is normal.  Nursing note and vitals reviewed.    Musculoskeletal Exam: C-spine, thoracic, and lumbar spine good ROM.  Shoulder joints and elbow joints good ROM.  Wrist joints limited ROM.  Synovial thickening of MCPs with no synovitis.  PIP and DIP thickening.  Hip joints, knee joints, and ankle joints good ROM.  MTPs, PIPs, and DIPs good ROM with no synovitis.    CDAI Exam: No CDAI exam completed.    Investigation: No additional findings.TB Gold: 07/16/2017 Negative  CBC Latest Ref Rng & Units 07/19/2017 02/15/2017 11/07/2016  WBC 3.8 - 10.8 Thousand/uL 6.8 6.3 5.4  Hemoglobin 13.2 - 17.1 g/dL 14.6 14.3 13.9  Hematocrit 38.5 - 50.0 % 42.3 43.6 41.2  Platelets 140 - 400 Thousand/uL 309 307 299   CMP Latest Ref Rng & Units 07/19/2017 05/23/2017 02/15/2017  Glucose 65 - 99 mg/dL 131(H) 137(H) 84  BUN 7 - 25 mg/dL 16 16 12   Creatinine 0.70 - 1.25 mg/dL 1.05 1.03 1.02  Sodium 135 - 146 mmol/L 138 143 141  Potassium 3.5 - 5.3 mmol/L 3.7 4.0 3.9  Chloride 98 - 110 mmol/L 104 104 105  CO2 20 - 32 mmol/L 24 22 26   Calcium 8.6 - 10.3 mg/dL 9.5 9.9 9.3  Total Protein 6.1 - 8.1 g/dL 7.1 6.8 6.5  Total Bilirubin 0.2 - 1.2 mg/dL 0.5 0.6 0.3  Alkaline Phos 40 - 115 U/L - 60 59  AST 10 - 35 U/L 26 20 25   ALT 9 - 46 U/L 25 24 32    Imaging: No results found.  Speciality Comments: No specialty comments available.    Procedures:  No procedures performed Allergies: Simponi [golimumab]; Aspirin; Augmentin [amoxicillin-pot clavulanate]; Sulfonamide  derivatives; and Theophyllines   Assessment / Plan:     Visit Diagnoses: Rheumatoid arthritis of multiple sites without rheumatoid factor ([place]) - RF-,[id number]-,[id number]: Patient has no synovitis on exam. He is clinically doing very well on Humira.  Medicare will no longer cover humira so he will be switched to Orencia infusions.  The side effects, risks and benefits, and contraindications were discussed.  He was consented on starting Orencia.  He was instructed to discontinue use of Humira 2 weeks before his first infusion of Orencia.  He will continue on MTX 5 tablets weekly.    High risk medication use - He will discontinue Humira 40 mg subcutaneous every other week 2 weeks before his first Orencia infusion, methotrexate 5 tablets by mouth every week, folic acid 1 mg by mouth daily-SPEP, HIV, and Immunoglobulins were ordered today before starting Orencia. - Plan: Protein electrophoresis, serum, IgG, IgA, IgM, HIV antibody  Other medical conditions are listed as follows:   History of depression  History of asthma  History of IBS  History of gastroesophageal reflux (GERD) - Controlled with medication  Family history of MS (multiple sclerosis) - Father  Screening examination for sexually transmitted disease - Plan: HIV antibody   Medication counseling:  TB Gold: 07/16/17 negative  Hepatitis panel: negative 06/2006 HIV: Pending SPEP: Pending Immunoglobulins: Pending  Does patient have a diagnosis of COPD? No  Counseled patient that Maureen Chatters is a selective T-cell costimulation blocker indicated for Psoriatic arthritis.  Counseled patient on purpose, proper use, and adverse effects of Orencia. The most common adverse effects are increased risk of infections, headache, and infusion reactions.  There is the possibility of an increased risk of malignancy but it is not well understood if this increased risk is due to the medication or the disease state.  Reviewed the importance of regular labs while on  Orencia therapy.  Counseled patient that Maureen Chatters should be held prior to scheduled surgery.  Counseled patient to avoid live vaccines while on Orencia.  Advised patient to get annual influenza vaccine and the pneumococcal vaccine as indicated.  Provided patient with medication education material and answered all questions.  Patient consented to [facility].  Will upload consent into patient's chart.  Will submit benefit's investigation for Orencia.   Orders: Orders Placed This Encounter  Procedures  . Protein electrophoresis, serum  . IgG, IgA, IgM  . HIV antibody   No orders of the defined types were placed in this encounter.   Face-to-face time spent with patient was 30 minutes. Greater than 50% of time was spent in counseling and coordination of care.  Follow-Up Instructions: Return in about 3 months (around 12/26/2017) for Rheumatoid arthritis.   Note - This record has been created using [employer].  Chart creation errors have been sought, but may not always  have been located. Such creation errors do not reflect on  the standard of medical care.

## 2017-09-27 ENCOUNTER — Other Ambulatory Visit: Payer: Self-pay | Admitting: *Deleted

## 2017-09-27 ENCOUNTER — Ambulatory Visit (INDEPENDENT_AMBULATORY_CARE_PROVIDER_SITE_OTHER): Payer: Medicare Other | Admitting: Rheumatology

## 2017-09-27 ENCOUNTER — Encounter: Payer: Self-pay | Admitting: Rheumatology

## 2017-09-27 VITALS — BP 155/80 | HR 66 | Resp 16 | Ht 68.0 in | Wt 188.0 lb

## 2017-09-27 DIAGNOSIS — Z8719 Personal history of other diseases of the digestive system: Secondary | ICD-10-CM | POA: Diagnosis not present

## 2017-09-27 DIAGNOSIS — Z79899 Other long term (current) drug therapy: Secondary | ICD-10-CM

## 2017-09-27 DIAGNOSIS — Z113 Encounter for screening for infections with a predominantly sexual mode of transmission: Secondary | ICD-10-CM

## 2017-09-27 DIAGNOSIS — Z8709 Personal history of other diseases of the respiratory system: Secondary | ICD-10-CM | POA: Diagnosis not present

## 2017-09-27 DIAGNOSIS — Z820 Family history of epilepsy and other diseases of the nervous system: Secondary | ICD-10-CM | POA: Diagnosis not present

## 2017-09-27 DIAGNOSIS — M0609 Rheumatoid arthritis without rheumatoid factor, multiple sites: Secondary | ICD-10-CM

## 2017-09-27 DIAGNOSIS — Z8659 Personal history of other mental and behavioral disorders: Secondary | ICD-10-CM

## 2017-09-27 NOTE — Patient Instructions (Addendum)
Stop Humira 2 weeks before Orencia infusion We will obtain labs 1 month after first orencia infusion and every 2 months with infusions  Abatacept solution for injection (subcutaneous or intravenous use) What is this medicine? ABATACEPT (a ba TA sept) is used to treat moderate to severe active rheumatoid arthritis or psoriatic arthritis in adults. This medicine is also used to treat juvenile idiopathic arthritis. This medicine may be used for other purposes; ask your health care provider or pharmacist if you have questions. COMMON BRAND NAME(S): Orencia What should I tell my health care provider before I take this medicine? They need to know if you have any of these conditions: -are taking other medicines to treat rheumatoid arthritis -COPD -diabetes -infection or history of infections -recently received or scheduled to receive a vaccine -scheduled to have surgery -tuberculosis, a positive skin test for tuberculosis or have recently been in close contact with someone who has tuberculosis -viral hepatitis -an unusual or allergic reaction to abatacept, other medicines, foods, dyes, or preservatives -pregnant or trying to get pregnant -breast-feeding How should I use this medicine? This medicine is for infusion into a vein or for injection under the skin. Infusions are given by a health care professional in a hospital or clinic setting. If you are to give your own medicine at home, you will be taught how to prepare and give this medicine under the skin. Use exactly as directed. Take your medicine at regular intervals. Do not take your medicine more often than directed. It is important that you put your used needles and syringes in a special sharps container. Do not put them in a trash can. If you do not have a sharps container, call your pharmacist or healthcare provider to get one. Talk to your pediatrician regarding the use of this medicine in children. While infusions in a clinic may be  prescribed for children as young as 2 years for selected conditions, precautions do apply. Overdosage: If you think you have taken too much of this medicine contact a poison control center or emergency room at once. NOTE: This medicine is only for you. Do not share this medicine with others. What if I miss a dose? This medicine is used once a week if given by injection under the skin. If you miss a dose, take it as soon as you can. If it is almost time for your next dose, take only that dose. Do not take double or extra doses. If you are to be given an infusion, it is important not to miss your dose. Doses are usually every 4 weeks. Call your doctor or health care professional if you are unable to keep an appointment. What may interact with this medicine? Do not take this medicine with any of the following medications: -adalimumab -anakinra -certolizumab -etanercept -golimumab -infliximab -live virus vaccines -rituximab -tocilizumab This medicine may also interact with the following medications: -vaccines This list may not describe all possible interactions. Give your health care provider a list of all the medicines, herbs, non-prescription drugs, or dietary supplements you use. Also tell them if you smoke, drink alcohol, or use illegal drugs. Some items may interact with your medicine. What should I watch for while using this medicine? Visit your doctor for regular check ups while you are taking this medicine. Tell your doctor or healthcare professional if your symptoms do not start to get better or if they get worse. Call your doctor or health care professional if you get a cold or other infection while  receiving this medicine. Do not treat yourself. This medicine may decrease your body's ability to fight infection. Try to avoid being around people who are sick. What side effects may I notice from receiving this medicine? Side effects that you should report to your doctor or health care  professional as soon as possible: -allergic reactions like skin rash, itching or hives, swelling of the face, lips, or tongue -breathing problems -chest pain -signs of infection - fever or chills, cough, unusual tiredness, pain or trouble passing urine, or warm, red or painful skin Side effects that usually do not require medical attention (report to your doctor or health care professional if they continue or are bothersome): -dizziness -headache -nausea, vomiting -sore throat -stomach upset This list may not describe all possible side effects. Call your doctor for medical advice about side effects. You may report side effects to FDA at 1-800-FDA-1088. Where should I keep my medicine? Infusions will be given in a hospital or clinic and will not be stored at home. Storage for syringes given under the skin and stored at home: Keep out of the reach of children. Store in a refrigerator between 2 and 8 degrees C (36 and 46 degrees F). Keep this medicine in the original container. Protect from light. Do not freeze. Throw away any unused medicine after the expiration date. NOTE: This sheet is a summary. It may not cover all possible information. If you have questions about this medicine, talk to your doctor, pharmacist, or health care provider.  2018 Elsevier/Gold Standard (2016-04-12 10:07:35)

## 2017-09-27 NOTE — Progress Notes (Signed)
error 

## 2017-09-27 NOTE — Telephone Encounter (Signed)
Patient in office 09/27/17. After labs result will place orders for infusion.

## 2017-10-03 LAB — PROTEIN ELECTROPHORESIS, SERUM
ALBUMIN ELP: 4.2 g/dL (ref 3.8–4.8)
ALPHA 1: 0.3 g/dL (ref 0.2–0.3)
Alpha 2: 0.7 g/dL (ref 0.5–0.9)
BETA 2: 0.4 g/dL (ref 0.2–0.5)
BETA GLOBULIN: 0.4 g/dL (ref 0.4–0.6)
GAMMA GLOBULIN: 1.1 g/dL (ref 0.8–1.7)
Total Protein: 7.1 g/dL (ref 6.1–8.1)

## 2017-10-03 LAB — IGG, IGA, IGM
IGM, SERUM: 168 mg/dL (ref 48–271)
IgG (Immunoglobin G), Serum: 1017 mg/dL (ref 694–1618)
Immunoglobulin A: 395 mg/dL (ref 81–463)

## 2017-10-03 LAB — HIV ANTIBODY (ROUTINE TESTING W REFLEX): HIV 1&2 Ab, 4th Generation: NONREACTIVE

## 2017-10-03 NOTE — Progress Notes (Signed)
All labs are WNL

## 2017-10-16 ENCOUNTER — Other Ambulatory Visit: Payer: Self-pay | Admitting: *Deleted

## 2017-10-18 ENCOUNTER — Other Ambulatory Visit: Payer: Self-pay | Admitting: *Deleted

## 2017-10-18 DIAGNOSIS — M0609 Rheumatoid arthritis without rheumatoid factor, multiple sites: Secondary | ICD-10-CM

## 2017-10-21 DIAGNOSIS — Z1389 Encounter for screening for other disorder: Secondary | ICD-10-CM | POA: Diagnosis not present

## 2017-10-21 DIAGNOSIS — M069 Rheumatoid arthritis, unspecified: Secondary | ICD-10-CM | POA: Diagnosis not present

## 2017-10-21 DIAGNOSIS — K219 Gastro-esophageal reflux disease without esophagitis: Secondary | ICD-10-CM | POA: Diagnosis not present

## 2017-10-21 DIAGNOSIS — I10 Essential (primary) hypertension: Secondary | ICD-10-CM | POA: Diagnosis not present

## 2017-10-21 DIAGNOSIS — F39 Unspecified mood [affective] disorder: Secondary | ICD-10-CM | POA: Diagnosis not present

## 2017-10-21 DIAGNOSIS — F319 Bipolar disorder, unspecified: Secondary | ICD-10-CM | POA: Diagnosis not present

## 2017-10-21 NOTE — Progress Notes (Signed)
error 

## 2017-10-22 ENCOUNTER — Ambulatory Visit (HOSPITAL_COMMUNITY)
Admission: RE | Admit: 2017-10-22 | Discharge: 2017-10-22 | Disposition: A | Discharge status: Home / Self Care | Payer: Medicare Other | Source: Ambulatory Visit | Attending: Rheumatology | Admitting: Rheumatology

## 2017-10-22 ENCOUNTER — Telehealth: Payer: Self-pay | Admitting: Internal Medicine

## 2017-10-22 MED ORDER — AMOXICILLIN-POT CLAVULANATE 875-125 MG PO TABS: 1.0000 | ORAL_TABLET | Freq: Two times a day (BID) | ORAL | 0 refills | Status: DC

## 2017-10-22 NOTE — Telephone Encounter (Signed)
Augmentin 875 mg, # 14, 1 twice daily °

## 2017-10-22 NOTE — Telephone Encounter (Signed)
Called pt letting him know we were sending a abx to his pharmacy. Pt expressed understanding. Rx sent to preferred pharmacy. Nothing further needed.

## 2017-10-22 NOTE — Progress Notes (Signed)
Pt states that he is recently over a cold but is feeling fine now. He states he has asthma and that his coughing gets worse after he has a cold. He does have a non productive deep frequent cough and a temp of 99.1.  Pt using inhaler. Dr Arlean Hopping office called and I spoke with Seth Bake who spoke with Dr Estanislado Pandy and we will hold Orencia until he gets cleared from his primary care physician.  Pt states that he saw his doctor yesterday but he did not have a temp at that time. No chest xray was done. Pt states will contact his PCP and Dr Estanislado Pandy and contact us to reschedule when he gets cleared. Pt left ambulatory per self.

## 2017-10-22 NOTE — Telephone Encounter (Signed)
Called and spoke with pt and he stated that he was unable to get his infusion this morning due to productive cough with green sputum, fever of 99.4, chest congestion and cough.  Pt is wanting to see if CY would call him something in for this.  Pt stated that he can take the augmentin.  CY please advise. Thanks  Allergies  Allergen Reactions  . Simponi [Golimumab]     Repeated infections  . Aspirin     REACTION: throat swelling  . Augmentin [Amoxicillin-Pot Clavulanate] Diarrhea and Nausea And Vomiting  . Sulfonamide Derivatives     REACTION: oral sores  . Theophyllines

## 2017-10-31 ENCOUNTER — Telehealth: Payer: Self-pay | Admitting: *Deleted

## 2017-10-31 DIAGNOSIS — J45909 Unspecified asthma, uncomplicated: Secondary | ICD-10-CM | POA: Diagnosis not present

## 2017-10-31 NOTE — Telephone Encounter (Signed)
Spoke with patient to advise that we have received clearance for patient to receive Orencia.Patient advised he will need to call to schedule his appointment. Patient provided with number for medical day again.

## 2017-11-06 ENCOUNTER — Other Ambulatory Visit: Payer: Self-pay | Admitting: *Deleted

## 2017-11-06 NOTE — Progress Notes (Signed)
Infusion orders are current for patient CBC CMP Tylenol Benadryl appointments are up to date and follow up appointment  is scheduled TB gold not due yet.  

## 2017-11-08 ENCOUNTER — Ambulatory Visit (HOSPITAL_COMMUNITY)
Admission: RE | Admit: 2017-11-08 | Discharge: 2017-11-08 | Disposition: A | Discharge status: Home / Self Care | Payer: Medicare Other | Source: Ambulatory Visit | Attending: Rheumatology | Admitting: Rheumatology

## 2017-11-08 ENCOUNTER — Telehealth: Payer: Self-pay | Admitting: Rheumatology

## 2017-11-08 DIAGNOSIS — M0609 Rheumatoid arthritis without rheumatoid factor, multiple sites: Secondary | ICD-10-CM | POA: Diagnosis not present

## 2017-11-08 LAB — COMPREHENSIVE METABOLIC PANEL
ALK PHOS: 67 U/L (ref 38–126)
ALT: 30 U/L (ref 17–63)
AST: 40 U/L (ref 15–41)
Albumin: 3.8 g/dL (ref 3.5–5.0)
Anion gap: 11 (ref 5–15)
BILIRUBIN TOTAL: 0.8 mg/dL (ref 0.3–1.2)
BUN: 13 mg/dL (ref 6–20)
CALCIUM: 9.4 mg/dL (ref 8.9–10.3)
CO2: 22 mmol/L (ref 22–32)
CREATININE: 1.08 mg/dL (ref 0.61–1.24)
Chloride: 102 mmol/L (ref 101–111)
GFR calc Af Amer: >60 mL/min (ref >60)
Glucose, Bld: 108 mg/dL — ABNORMAL HIGH (ref 65–99)
Potassium: 4.3 mmol/L (ref 3.5–5.1)
SODIUM: 135 mmol/L (ref 135–145)
TOTAL PROTEIN: 6.8 g/dL (ref 6.5–8.1)

## 2017-11-08 LAB — CBC
HCT: 43.6 % (ref 39.0–52.0)
HEMATOCRIT: 42.6 % (ref 39.0–52.0)
HEMOGLOBIN: 13.6 g/dL (ref 13.0–17.0)
Hemoglobin: 14.1 g/dL (ref 13.0–17.0)
MCH: 32.2 pg (ref 26.0–34.0)
MCH: 32.9 pg (ref 26.0–34.0)
MCHC: 31.9 g/dL (ref 30.0–36.0)
MCHC: 32.3 g/dL (ref 30.0–36.0)
MCV: 100.9 fL — AB (ref 78.0–100.0)
MCV: 101.6 fL — ABNORMAL HIGH (ref 78.0–100.0)
Platelets: 306 10*3/uL (ref 150–400)
Platelets: 313 10*3/uL (ref 150–400)
RBC: 4.22 MIL/uL (ref 4.22–5.81)
RBC: 4.29 MIL/uL (ref 4.22–5.81)
RDW: 14.2 % (ref 11.5–15.5)
RDW: 13.9 % (ref 11.5–15.5)
WBC: 6 10*3/uL (ref 4.0–10.5)
WBC: 7 10*3/uL (ref 4.0–10.5)

## 2017-11-08 MED ORDER — ACETAMINOPHEN 325 MG PO TABS
ORAL_TABLET | ORAL | Status: AC
  Filled 2017-11-08: qty 2

## 2017-11-08 MED ORDER — DIPHENHYDRAMINE HCL 25 MG PO CAPS
25.0000 mg | ORAL_CAPSULE | ORAL | Status: DC
  Administered 2017-11-08: 25 mg via ORAL

## 2017-11-08 MED ORDER — SODIUM CHLORIDE 0.9 % IV SOLN
750.0000 mg | INTRAVENOUS | Status: DC
  Administered 2017-11-08: 10:00:00 750 mg via INTRAVENOUS
  Filled 2017-11-08: qty 30

## 2017-11-08 MED ORDER — ACETAMINOPHEN 325 MG PO TABS
650.0000 mg | ORAL_TABLET | ORAL | Status: DC
  Administered 2017-11-08: 650 mg via ORAL

## 2017-11-08 MED ORDER — DIPHENHYDRAMINE HCL 25 MG PO CAPS
ORAL_CAPSULE | ORAL | Status: AC
  Filled 2017-11-08: qty 1

## 2017-11-08 NOTE — Progress Notes (Signed)
wnl

## 2017-11-08 NOTE — Telephone Encounter (Signed)
Patient advised he should have the next infusion for 2 weeks from now. Patient verbalized understanding.

## 2017-11-08 NOTE — Telephone Encounter (Signed)
Patient left a voicemail stating that he had his infusion this morning and it went well.  Patient is unsure when he should get his second infusion and requested a return call.

## 2017-11-08 NOTE — Discharge Instructions (Signed)
Abatacept solution for injection (subcutaneous or intravenous use)  What is this medicine?  ABATACEPT (a ba TA sept) is used to treat moderate to severe active rheumatoid arthritis or psoriatic arthritis in adults. This medicine is also used to treat juvenile idiopathic arthritis.  This medicine may be used for other purposes; ask your health care provider or pharmacist if you have questions.  COMMON BRAND NAME(S): Orencia  What should I tell my health care provider before I take this medicine?  They need to know if you have any of these conditions:  -are taking other medicines to treat rheumatoid arthritis  -COPD  -diabetes  -infection or history of infections  -recently received or scheduled to receive a vaccine  -scheduled to have surgery  -tuberculosis, a positive skin test for tuberculosis or have recently been in close contact with someone who has tuberculosis  -viral hepatitis  -an unusual or allergic reaction to abatacept, other medicines, foods, dyes, or preservatives  -pregnant or trying to get pregnant  -breast-feeding  How should I use this medicine?  This medicine is for infusion into a vein or for injection under the skin. Infusions are given by a health care professional in a hospital or clinic setting. If you are to give your own medicine at home, you will be taught how to prepare and give this medicine under the skin. Use exactly as directed. Take your medicine at regular intervals. Do not take your medicine more often than directed.  It is important that you put your used needles and syringes in a special sharps container. Do not put them in a trash can. If you do not have a sharps container, call your pharmacist or healthcare provider to get one.  Talk to your pediatrician regarding the use of this medicine in children. While infusions in a clinic may be prescribed for children as young as 2 years for selected conditions, precautions do apply.  Overdosage: If you think you have taken too much of  this medicine contact a poison control center or emergency room at once.  NOTE: This medicine is only for you. Do not share this medicine with others.  What if I miss a dose?  This medicine is used once a week if given by injection under the skin. If you miss a dose, take it as soon as you can. If it is almost time for your next dose, take only that dose. Do not take double or extra doses.  If you are to be given an infusion, it is important not to miss your dose. Doses are usually every 4 weeks. Call your doctor or health care professional if you are unable to keep an appointment.  What may interact with this medicine?  Do not take this medicine with any of the following medications:  -adalimumab  -anakinra  -certolizumab  -etanercept  -golimumab  -infliximab  -live virus vaccines  -rituximab  -tocilizumab  This medicine may also interact with the following medications:  -vaccines  This list may not describe all possible interactions. Give your health care provider a list of all the medicines, herbs, non-prescription drugs, or dietary supplements you use. Also tell them if you smoke, drink alcohol, or use illegal drugs. Some items may interact with your medicine.  What should I watch for while using this medicine?  Visit your doctor for regular check ups while you are taking this medicine. Tell your doctor or healthcare professional if your symptoms do not start to get better or if they   get worse.  Call your doctor or health care professional if you get a cold or other infection while receiving this medicine. Do not treat yourself. This medicine may decrease your body's ability to fight infection. Try to avoid being around people who are sick.  What side effects may I notice from receiving this medicine?  Side effects that you should report to your doctor or health care professional as soon as possible:  -allergic reactions like skin rash, itching or hives, swelling of the face, lips, or tongue  -breathing  problems  -chest pain  -signs of infection - fever or chills, cough, unusual tiredness, pain or trouble passing urine, or warm, red or painful skin  Side effects that usually do not require medical attention (report to your doctor or health care professional if they continue or are bothersome):  -dizziness  -headache  -nausea, vomiting  -sore throat  -stomach upset  This list may not describe all possible side effects. Call your doctor for medical advice about side effects. You may report side effects to FDA at 1-800-FDA-1088.  Where should I keep my medicine?  Infusions will be given in a hospital or clinic and will not be stored at home.  Storage for syringes given under the skin and stored at home:  Keep out of the reach of children. Store in a refrigerator between 2 and 8 degrees C (36 and 46 degrees F). Keep this medicine in the original container. Protect from light. Do not freeze. Throw away any unused medicine after the expiration date.  NOTE: This sheet is a summary. It may not cover all possible information. If you have questions about this medicine, talk to your doctor, pharmacist, or health care provider.   2018 Elsevier/Gold Standard (2016-04-12 10:07:35)

## 2017-11-19 ENCOUNTER — Telehealth: Payer: Self-pay | Admitting: Internal Medicine

## 2017-11-19 MED ORDER — CEFDINIR 300 MG PO CAPS: 300.0000 mg | ORAL_CAPSULE | Freq: Two times a day (BID) | ORAL | 0 refills | Status: DC

## 2017-11-19 NOTE — Telephone Encounter (Signed)
Spoke with pt, he states he is having another respiratory infection and wants to prevent this from going into his chest. He has symptoms of green discharge from his sinuses, blowing his nose frequently, and face pain. There is pressure around his eyes and has been going on for 3 days. He states this happens but usually clears up but it is not clearing up this time. He denies fever and any other symptoms. CY please advise.   [employer] [facility]  Current Outpatient Medications on File Prior to Visit  Medication Sig Dispense Refill  . albuterol (PROVENTIL HFA;VENTOLIN HFA) 108 (90 Base) MCG/ACT inhaler Inhale 2 puffs every 6 (six) hours as needed into the lungs for wheezing or shortness of breath. 1 Inhaler 5  . amoxicillin-clavulanate (AUGMENTIN) 875-125 MG tablet Take 1 tablet by mouth 2 (two) times daily. 14 tablet 0  . BREO ELLIPTA 200-25 MCG/INH AEPB INHALE ONE DOSE BY MOUTH DAILY 60 each 11  . cholestyramine (QUESTRAN) 4 GM/DOSE powder     . clonazePAM (KLONOPIN) 1 MG tablet Take 1 tablet by mouth daily.    Marland Kitchen doxycycline (VIBRA-TABS) 100 MG tablet Take 1 tablet (100 mg total) by mouth 2 (two) times daily. (Patient not taking: Reported on 09/27/2017) 20 tablet 0  . fluticasone (FLONASE) 50 MCG/ACT nasal spray Place 1 spray into both nostrils daily. 16 g 11  . folic acid (FOLVITE) 1 MG tablet Take 2 tablets (2 mg total) by mouth daily. 180 tablet 4  . HUMIRA PEN 40 MG/0.8ML PNKT INJECT THE CONTENTS OF 1 PEN (40 MG) UNDER THE SKIN EVERY OTHER WEEK 3 each 0  . lithium carbonate 300 MG capsule Take 300 mg by mouth 3 (three) times daily.     Marland Kitchen losartan-hydrochlorothiazide (HYZAAR) 100-25 MG tablet     . methotrexate (RHEUMATREX) 2.5 MG tablet Take 12.5 mg by mouth once a week. Caution:Chemotherapy. Protect from light.    . methotrexate (RHEUMATREX) 2.5 MG tablet TAKE 5 TABLETS BY MOUTH ONCE A WEEK 60 tablet 0  . montelukast (SINGULAIR) 10 MG tablet TAKE 1 TABLET DAILY 90 tablet 3  . Multiple  Vitamin (MULTIVITAMIN) tablet Take 1 tablet by mouth daily.    . Multiple Vitamins-Minerals (MULTIVITAMIN WITH MINERALS) tablet Take by mouth.    . nystatin (MYCOSTATIN) 100000 UNIT/ML suspension     . oxymetazoline (AFRIN) 0.05 % nasal spray Place 2 sprays into the nose daily as needed.     . Probiotic Product (PROBIOTIC PO) Take by mouth.    . sildenafil (VIAGRA) 100 MG tablet Take 100 mg by mouth daily as needed.      . Testosterone 20.25 MG/ACT (1.62%) GEL Apply 2 pumps as directed once a day     No current facility-administered medications on file prior to visit.    Allergies  Allergen Reactions  . Simponi [Golimumab]     Repeated infections  . Aspirin     REACTION: throat swelling  . Augmentin [Amoxicillin-Pot Clavulanate] Diarrhea and Nausea And Vomiting  . Sulfonamide Derivatives     REACTION: oral sores  . Theophyllines

## 2017-11-19 NOTE — Telephone Encounter (Signed)
Offer cefdinir 300 mg, # 20, 1 twice dialy

## 2017-11-19 NOTE — Telephone Encounter (Signed)
Called pt and advised message from the provider. Pt understood and verbalized understanding. Nothing further is needed.    Rx sent in. 

## 2017-11-20 ENCOUNTER — Other Ambulatory Visit: Payer: Self-pay | Admitting: *Deleted

## 2017-11-20 NOTE — Progress Notes (Signed)
Infusion orders are current for patient CBC CMP Tylenol Benadryl appointments are up to date and follow up appointment  is scheduled TB gold not due yet.  

## 2017-11-21 DIAGNOSIS — F329 Major depressive disorder, single episode, unspecified: Secondary | ICD-10-CM | POA: Diagnosis not present

## 2017-11-21 DIAGNOSIS — I10 Essential (primary) hypertension: Secondary | ICD-10-CM | POA: Diagnosis not present

## 2017-11-21 DIAGNOSIS — J45909 Unspecified asthma, uncomplicated: Secondary | ICD-10-CM | POA: Diagnosis not present

## 2017-11-21 DIAGNOSIS — M069 Rheumatoid arthritis, unspecified: Secondary | ICD-10-CM | POA: Diagnosis not present

## 2017-11-22 ENCOUNTER — Ambulatory Visit (HOSPITAL_COMMUNITY)
Admission: RE | Admit: 2017-11-22 | Discharge: 2017-11-22 | Disposition: A | Discharge status: Home / Self Care | Payer: Medicare Other | Source: Ambulatory Visit | Attending: Rheumatology | Admitting: Rheumatology

## 2017-11-22 DIAGNOSIS — M0609 Rheumatoid arthritis without rheumatoid factor, multiple sites: Secondary | ICD-10-CM | POA: Diagnosis not present

## 2017-11-22 MED ORDER — ACETAMINOPHEN 325 MG PO TABS: 650.0000 mg | ORAL_TABLET | ORAL | Status: DC

## 2017-11-22 MED ORDER — DIPHENHYDRAMINE HCL 25 MG PO CAPS: 25.0000 mg | ORAL_CAPSULE | ORAL | Status: DC

## 2017-11-22 MED ORDER — SODIUM CHLORIDE 0.9 % IV SOLN
750.0000 mg | INTRAVENOUS | Status: DC
  Administered 2017-11-22: 750 mg via INTRAVENOUS
  Filled 2017-11-22: qty 30

## 2017-11-28 DIAGNOSIS — Z79899 Other long term (current) drug therapy: Secondary | ICD-10-CM | POA: Diagnosis not present

## 2017-12-05 ENCOUNTER — Telehealth: Payer: Self-pay

## 2017-12-05 ENCOUNTER — Ambulatory Visit (HOSPITAL_COMMUNITY)
Admission: RE | Admit: 2017-12-05 | Discharge: 2017-12-05 | Disposition: A | Discharge status: Home / Self Care | Payer: Medicare Other | Source: Ambulatory Visit | Attending: Rheumatology | Admitting: Rheumatology

## 2017-12-05 ENCOUNTER — Telehealth: Payer: Self-pay | Admitting: Internal Medicine

## 2017-12-05 NOTE — Telephone Encounter (Signed)
Received a call from short stay asking for orders to be put in for pt. He is currently using Orencia IV. He had his first 3 doses (10/22/17, 11/08/17, 11/19/17). Verified with Dr. Estanislado Pandy that patient will now infuse every 4 weeks.   Called short stay and spoke with [facility] to give her the update. She states that she will let the patient know and reschedule the infusion for every 4 weeks.   Obadiah Dennard 9:26 AM

## 2017-12-05 NOTE — Telephone Encounter (Signed)
Attempted to call pt but no answer and no VM has been set up.  Will try to call pt back later.

## 2017-12-06 ENCOUNTER — Other Ambulatory Visit: Payer: Self-pay | Admitting: *Deleted

## 2017-12-06 NOTE — Telephone Encounter (Signed)
Attempted to call pt but no answer and not able to leave a VM  Second attempt to try to reach pt

## 2017-12-09 NOTE — Telephone Encounter (Signed)
atc X3, no answer, no vm set up.  Will close per triage protocol.

## 2017-12-11 ENCOUNTER — Other Ambulatory Visit: Payer: Self-pay | Admitting: *Deleted

## 2017-12-11 DIAGNOSIS — M0609 Rheumatoid arthritis without rheumatoid factor, multiple sites: Secondary | ICD-10-CM

## 2017-12-11 NOTE — Progress Notes (Signed)
Office Visit Note  Patient: [name] [name]             Date of Birth: 1951/08/15           MRN: [id number]             PCP: Wenda Low, MD Referring: Wenda Low, MD Visit Date: 12/24/2017 Occupation: @GUAROCC @    Subjective:  Rheumatoid Arthritis (Doing good)   History of Present Illness: [name] [name] is a 67 y.o. male history of rheumatoid arthritis.  He states he is doing much better on Orencia IV infusions.  He states in the beginning he felt increased pain and stiffness in his hands but once the Orencia got into his system his symptoms improved.  He also discontinued methotrexate prior to restarting Orencia.  Activities of Daily Living:  Patient reports morning stiffness for 2 hours   Patient Denies nocturnal pain.  Difficulty dressing/grooming: Reports with buttons only. Difficulty climbing stairs: Denies Difficulty getting out of chair: Denies Difficulty using hands for taps, buttons, cutlery, and/or writing: Reports   Review of Systems  Constitutional: Negative for activity change, fatigue, night sweats and weakness ( ).  HENT: Negative for mouth sores, trouble swallowing, trouble swallowing, mouth dryness and nose dryness.   Eyes: Negative for redness and dryness.  Respiratory: Negative for shortness of breath and difficulty breathing.   Cardiovascular: Negative for chest pain, palpitations, hypertension, irregular heartbeat and swelling in legs/feet.  Gastrointestinal: Negative for constipation, diarrhea and heartburn.  Endocrine: Negative for excessive thirst and increased urination.  Genitourinary: Negative for difficulty urinating.  Musculoskeletal: Positive for morning stiffness. Negative for arthralgias, joint pain, joint swelling, myalgias, muscle weakness, muscle tenderness and myalgias.  Skin: Negative for color change, rash, hair loss, nodules/bumps, skin tightness, ulcers and sensitivity to sunlight.  Allergic/Immunologic: Negative for  susceptible to infections.  Neurological: Positive for tremors. Negative for dizziness, fainting, numbness, memory loss and night sweats.  Hematological: Negative for bruising/bleeding tendency and swollen glands.  Psychiatric/Behavioral: Negative for depressed mood and sleep disturbance. The patient is not nervous/anxious.     PMFS History:  Patient Active Problem List   Diagnosis Date Noted  . History of depression 07/15/2017  . History of asthma 07/08/2017  . 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 Asthmatic bronchitis, mild intermittent, uncomplicated 81/27/5170  . Asthmatic bronchitis with acute exacerbation 10/15/2016  . Herpes zoster 08/01/2013  . Rheumatoid arthritis of multiple sites without rheumatoid factor ([place]) 09/18/2010  . NASAL POLYP 12/24/2007  . Sinusitis, chronic 12/24/2007  . Seasonal and perennial allergic rhinitis 12/24/2007  . Allergic-infective asthma 12/24/2007  . G E R D 12/24/2007    Past Medical History:  Diagnosis Date  . ALLERGIC RHINITIS   . Asthma   . GERD (gastroesophageal reflux disease)   . Rheumatoid arthritis(714.0)     Family History  Problem Relation Age of Onset  . Multiple sclerosis Father   . Breast cancer Mother   . Breast cancer Sister    Past Surgical History:  Procedure Laterality Date  . extensive sinus surgery with ablation of the frontal sinuses    . HERNIA REPAIR    . nasal polypectomies     Social History   Social History Narrative  . Not on file     Objective: Vital Signs: BP 140/78 (BP Location: Left Arm, Patient Position: Sitting, Cuff Size: Normal)   Pulse 64   Resp 16   Ht 5\' 8"  (1.727 m)   Wt 190 lb (86.2 kg)  BMI 28.89 kg/m    Physical Exam  Constitutional: He is oriented to person, place, and time. He appears well-developed and well-nourished.  HENT:  Head: Normocephalic and atraumatic.  Eyes: Conjunctivae and EOM are normal. Pupils are equal, round, and reactive to light.  Neck:  Normal range of motion. Neck supple.  Cardiovascular: Normal rate, regular rhythm and normal heart sounds.  Pulmonary/Chest: Effort normal and breath sounds normal.  Abdominal: Soft. Bowel sounds are normal.  Neurological: He is alert and oriented to person, place, and time.  Skin: Skin is warm and dry. Capillary refill takes less than 2 seconds.  Psychiatric: He has a normal mood and affect. His behavior is normal.  Nursing note and vitals reviewed.    Musculoskeletal Exam: C-spine thoracic lumbar spine good range of motion.  Shoulder joints all joints with good range of motion.  He has limited range of motion of bilateral wrist joints.  He is thickening over MCP joints but no active synovitis was noted.  He also has DIP PIP thickening in his hands due to osteoarthritis.  Hip joints, knee joints, ankle joints are good range of motion.  He has some DIP PIP thickening in his feet.  CDAI Exam: CDAI Homunculus Exam:   Joint Counts:  CDAI Tender Joint count: 0 CDAI Swollen Joint count: 0  Global Assessments:  Patient Global Assessment: 2 Provider Global Assessment: 2  CDAI Calculated Score: 4    Investigation:No additional findings.     CBC Latest Ref Rng & Units 11/08/2017 11/08/2017 07/19/2017  WBC 4.0 - 10.5 K/uL 7.0 6.0 6.8  Hemoglobin 13.0 - 17.0 g/dL 14.1 13.6 14.6  Hematocrit 39.0 - 52.0 % 43.6 42.6 42.3  Platelets 150 - 400 K/uL 313 306 309   CMP Latest Ref Rng & Units 11/08/2017 09/27/2017 07/19/2017  Glucose 65 - 99 mg/dL 108(H) - 131(H)  BUN 6 - 20 mg/dL 13 - 16  Creatinine 0.61 - 1.24 mg/dL 1.08 - 1.05  Sodium 135 - 145 mmol/L 135 - 138  Potassium 3.5 - 5.1 mmol/L 4.3 - 3.7  Chloride 101 - 111 mmol/L 102 - 104  CO2 22 - 32 mmol/L 22 - 24  Calcium 8.9 - 10.3 mg/dL 9.4 - 9.5  Total Protein 6.5 - 8.1 g/dL 6.8 7.1 7.1  Total Bilirubin 0.3 - 1.2 mg/dL 0.8 - 0.5  Alkaline Phos 38 - 126 U/L 67 - -  AST 15 - 41 U/L 40 - 26  ALT 17 - 63 U/L 30 - 25   TB gold 07/16/17 was  negative  Imaging: Xr Foot 2 Views Left  Result Date: 12/24/2017 PIP and DIP narrowing was noted.  No MTP joint narrowing was noted.  First MTP narrowing was noted.  No erosive changes were noted.  Mild subtalar joint space narrowing was noted.  No intertarsal joint space narrowing was noted.  No radiographic progression was noted on comparison with x-rays of 2011. Impression: These findings are consistent with rheumatoid arthritis and osteoarthritis overlap.  Xr Foot 2 Views Right  Result Date: 12/24/2017 PIP and DIP narrowing was noted.  No MTP joint narrowing was noted.  First MTP narrowing was noted.  No erosive changes were noted.  Mild subtalar joint space narrowing was noted.  No intertarsal joint space narrowing was noted.  A small calcaneal spur was noted.  No radiographic progression was noted on comparison from the x-rays of 2011. Impression: These findings are consistent with rheumatoid arthritis and osteoarthritis overlap.  Xr Hand 2 View  Left  Result Date: 12/24/2017 Narrowing of all MCP joints without juxta-articular osteopenia was noted.  Erosive changes were noted in the second and fifth MCP joints.  PIP narrowing was noted.  Intercarpal and radiocarpal joint space narrowing was noted.  There is no radiographic progression from his x-rays of 2011. Impression: These findings are consistent with rheumatoid arthritis and osteoarthritis overlap.  Xr Hand 2 View Right  Result Date: 12/24/2017 Narrowing of all MCP joints with juxta-articular osteopenia was noted.  PIP joint space narrowing was noted.  Metacarpal carpal, intercarpal, radiocarpal joint space narrowing was noted.  There is no radiographic on comparison with his x-rays of 2011. Impression: These findings are consistent with rheumatoid arthritis and osteoarthritis overlap.   Speciality Comments: No specialty comments available.    Procedures:  No procedures performed Allergies: Simponi [golimumab]; Aspirin; Other;  Sulfonamide derivatives; and Theophyllines   Assessment / Plan:     Visit Diagnoses: Rheumatoid arthritis of multiple sites without rheumatoid factor ([place]) -his arthritis is much better controlled on Orencia IV.  He does not have any synovitis on examination.  He still have some synovial thickening and morning stiffness.  I will obtain x-rays to monitor radiographic progression.  He has discontinued methotrexate as he did not like the fatigue from it.  Plan: XR Hand 2 View Right, XR Hand 2 View Left, XR Foot 2 Views Right, XR Foot 2 Views Left  High risk medication use - Orencia IV CBC/CMP 11/08/17  History of depression: Doing better with medications.  Tremors: He does have some benign essential tremors which interfere with his activities.  History of asthma  History of IBS  History of gastroesophageal reflux (GERD)    Orders: Orders Placed This Encounter  Procedures  . XR Hand 2 View Right  . XR Hand 2 View Left  . XR Foot 2 Views Right  . XR Foot 2 Views Left   No orders of the defined types were placed in this encounter.   Face-to-face time spent with patient was 25 minutes.  Greater than 50% of time was spent in counseling and coordination of care.  Follow-Up Instructions: Return in about 5 months (around 05/26/2018) for Rheumatoid arthritis.   Bo Merino, MD  Note - This record has been created using Editor, commissioning.  Chart creation errors have been sought, but may not always  have been located. Such creation errors do not reflect on  the standard of medical care.

## 2017-12-12 DIAGNOSIS — E230 Hypopituitarism: Secondary | ICD-10-CM | POA: Diagnosis not present

## 2017-12-18 ENCOUNTER — Other Ambulatory Visit (HOSPITAL_COMMUNITY): Payer: Self-pay | Admitting: *Deleted

## 2017-12-19 ENCOUNTER — Encounter (HOSPITAL_COMMUNITY): Payer: Medicare Other

## 2017-12-19 ENCOUNTER — Ambulatory Visit (HOSPITAL_COMMUNITY)
Admission: RE | Admit: 2017-12-19 | Discharge: 2017-12-19 | Disposition: A | Discharge status: Home / Self Care | Payer: Medicare Other | Source: Ambulatory Visit | Attending: Rheumatology | Admitting: Rheumatology

## 2017-12-19 DIAGNOSIS — E230 Hypopituitarism: Secondary | ICD-10-CM | POA: Diagnosis not present

## 2017-12-19 DIAGNOSIS — M0609 Rheumatoid arthritis without rheumatoid factor, multiple sites: Secondary | ICD-10-CM | POA: Insufficient documentation

## 2017-12-19 DIAGNOSIS — R5382 Chronic fatigue, unspecified: Secondary | ICD-10-CM | POA: Diagnosis not present

## 2017-12-19 MED ORDER — ACETAMINOPHEN 325 MG PO TABS
ORAL_TABLET | ORAL | Status: AC
  Administered 2017-12-19: 11:00:00 650 mg via ORAL
  Filled 2017-12-19: qty 2

## 2017-12-19 MED ORDER — DIPHENHYDRAMINE HCL 25 MG PO CAPS
25.0000 mg | ORAL_CAPSULE | ORAL | Status: DC
  Administered 2017-12-19: 25 mg via ORAL

## 2017-12-19 MED ORDER — ACETAMINOPHEN 325 MG PO TABS
650.0000 mg | ORAL_TABLET | ORAL | Status: DC
  Administered 2017-12-19: 650 mg via ORAL

## 2017-12-19 MED ORDER — SODIUM CHLORIDE 0.9 % IV SOLN
750.0000 mg | INTRAVENOUS | Status: DC
  Administered 2017-12-19: 11:00:00 750 mg via INTRAVENOUS
  Filled 2017-12-19: qty 30

## 2017-12-19 MED ORDER — DIPHENHYDRAMINE HCL 25 MG PO CAPS
ORAL_CAPSULE | ORAL | Status: AC
  Administered 2017-12-19: 25 mg via ORAL
  Filled 2017-12-19: qty 1

## 2017-12-24 ENCOUNTER — Ambulatory Visit (INDEPENDENT_AMBULATORY_CARE_PROVIDER_SITE_OTHER): Payer: Medicare Other | Admitting: Rheumatology

## 2017-12-24 ENCOUNTER — Ambulatory Visit (INDEPENDENT_AMBULATORY_CARE_PROVIDER_SITE_OTHER): Payer: Self-pay

## 2017-12-24 ENCOUNTER — Ambulatory Visit (INDEPENDENT_AMBULATORY_CARE_PROVIDER_SITE_OTHER): Payer: Medicare Other

## 2017-12-24 ENCOUNTER — Encounter: Payer: Self-pay | Admitting: Physician Assistant

## 2017-12-24 VITALS — BP 140/78 | HR 64 | Resp 16 | Ht 68.0 in | Wt 190.0 lb

## 2017-12-24 DIAGNOSIS — Z8719 Personal history of other diseases of the digestive system: Secondary | ICD-10-CM | POA: Diagnosis not present

## 2017-12-24 DIAGNOSIS — G252 Other specified forms of tremor: Secondary | ICD-10-CM | POA: Diagnosis not present

## 2017-12-24 DIAGNOSIS — Z79899 Other long term (current) drug therapy: Secondary | ICD-10-CM

## 2017-12-24 DIAGNOSIS — M0609 Rheumatoid arthritis without rheumatoid factor, multiple sites: Secondary | ICD-10-CM

## 2017-12-24 DIAGNOSIS — Z8709 Personal history of other diseases of the respiratory system: Secondary | ICD-10-CM | POA: Diagnosis not present

## 2017-12-24 DIAGNOSIS — Z8659 Personal history of other mental and behavioral disorders: Secondary | ICD-10-CM

## 2017-12-28 ENCOUNTER — Other Ambulatory Visit: Payer: Self-pay | Admitting: Internal Medicine

## 2018-01-03 ENCOUNTER — Other Ambulatory Visit: Payer: Self-pay | Admitting: *Deleted

## 2018-01-03 NOTE — Progress Notes (Signed)
Infusion orders are current for patient CBC CMP Tylenol Benadryl appointments are up to date and follow up appointment  is scheduled TB gold not due yet.  

## 2018-01-10 ENCOUNTER — Telehealth: Payer: Self-pay | Admitting: Rheumatology

## 2018-01-10 NOTE — Telephone Encounter (Signed)
Patient left a voicemail stating that he would like to get information on getting financial help for his Orencia.

## 2018-01-10 NOTE — Telephone Encounter (Signed)
Returned patient's call. Let message with the [place] phone number for him to call and get enrolled. [phone].  Konya Fauble, [place], CPhT 3:02 PM

## 2018-01-14 ENCOUNTER — Telehealth: Payer: Self-pay | Admitting: Rheumatology

## 2018-01-14 NOTE — Telephone Encounter (Signed)
Patient calling in reference to Assistance for infusion. Per patient [facility] sending a fax to be filled out for [place] assistance. [phone] correct # per patient. #  We gave him was incorrect.

## 2018-01-14 NOTE — Telephone Encounter (Signed)
Returned pts call. We have received the fax from [place] with the information for infusion co-pay assistance. We will completed our portion of the application and pt will come in today around 3pm to sign his portion. After all is complete we will fax application to foundation. Patient voices understanding and denies any questions at this time.   Manuelita Moxon, [place], CPhT 12:30 PM

## 2018-01-14 NOTE — Telephone Encounter (Signed)
After reviewing the requirements for co-pay assistance I found that anyone participating in in any state or federal healthcar programs including Medicare will not be eligible. Called pt to update. He states that his current co-pay is about $620 per month. He would like to see if there are any other options for him. I am unable to see the copy his copay cards in epic. He is around the corner and will bring them by. Will complete a BIV on Orencia IV and Orencia SQ and update once I have a response.   Maeli Spacek, [place], CPhT 1:53 PM

## 2018-01-15 NOTE — Telephone Encounter (Addendum)
Patient has Medicare and [place] with Nunzio Cory at [facility] who states that pt plan is active as of 08/08/2017. He has plan F which follows all guidelines as Medicare. They cover all deductibles (Part A and B) and the remaining 20% that Medicare don't cover. Claims can be submitted to:  [employer]  [place] [address] [place] [address] Phone: [phone]  Called pt to update. He states that he reach out to billing about his account. They did not submit claims to [facility]. They will look into his account and keep him informed of the process.   Jaid Quirion, [place], CPhT 11:47 AM

## 2018-01-16 ENCOUNTER — Ambulatory Visit (HOSPITAL_COMMUNITY)
Admission: RE | Admit: 2018-01-16 | Discharge: 2018-01-16 | Disposition: A | Discharge status: Home / Self Care | Payer: Medicare Other | Source: Ambulatory Visit | Attending: Rheumatology | Admitting: Rheumatology

## 2018-01-16 DIAGNOSIS — M0609 Rheumatoid arthritis without rheumatoid factor, multiple sites: Secondary | ICD-10-CM | POA: Insufficient documentation

## 2018-01-16 LAB — COMPREHENSIVE METABOLIC PANEL
ALK PHOS: 65 U/L (ref 38–126)
ALT: 32 U/L (ref 17–63)
ANION GAP: 8 (ref 5–15)
AST: 30 U/L (ref 15–41)
Albumin: 4 g/dL (ref 3.5–5.0)
BILIRUBIN TOTAL: 0.7 mg/dL (ref 0.3–1.2)
BUN: 11 mg/dL (ref 6–20)
CALCIUM: 9.5 mg/dL (ref 8.9–10.3)
CO2: 24 mmol/L (ref 22–32)
Chloride: 105 mmol/L (ref 101–111)
Creatinine, Ser: 1.04 mg/dL (ref 0.61–1.24)
GFR calc Af Amer: >60 mL/min (ref >60)
GFR calc non Af Amer: >60 mL/min (ref >60)
GLUCOSE: 85 mg/dL (ref 65–99)
POTASSIUM: 4 mmol/L (ref 3.5–5.1)
SODIUM: 137 mmol/L (ref 135–145)
TOTAL PROTEIN: 7.2 g/dL (ref 6.5–8.1)

## 2018-01-16 LAB — CBC
HEMATOCRIT: 43.8 % (ref 39.0–52.0)
HEMOGLOBIN: 14.5 g/dL (ref 13.0–17.0)
MCH: 32.7 pg (ref 26.0–34.0)
MCHC: 33.1 g/dL (ref 30.0–36.0)
MCV: 98.9 fL (ref 78.0–100.0)
Platelets: 313 10*3/uL (ref 150–400)
RBC: 4.43 MIL/uL (ref 4.22–5.81)
RDW: 13.5 % (ref 11.5–15.5)
WBC: 6.2 10*3/uL (ref 4.0–10.5)

## 2018-01-16 MED ORDER — SODIUM CHLORIDE 0.9 % IV SOLN
750.0000 mg | INTRAVENOUS | Status: DC
  Administered 2018-01-16: 13:00:00 750 mg via INTRAVENOUS
  Filled 2018-01-16: qty 30

## 2018-01-16 MED ORDER — DIPHENHYDRAMINE HCL 25 MG PO CAPS: 25.0000 mg | ORAL_CAPSULE | ORAL | Status: DC

## 2018-01-16 MED ORDER — ACETAMINOPHEN 325 MG PO TABS: 650.0000 mg | ORAL_TABLET | ORAL | Status: DC

## 2018-01-16 NOTE — Progress Notes (Signed)
wnl

## 2018-01-21 ENCOUNTER — Telehealth: Payer: Self-pay | Admitting: Rheumatology

## 2018-01-21 NOTE — Telephone Encounter (Signed)
Returned pts call. Left message.  [name], [place], CPhT 3:37 PM

## 2018-01-21 NOTE — Telephone Encounter (Signed)
Patient left a voicemail returning your call regarding his insurance information for medication.

## 2018-01-21 NOTE — Telephone Encounter (Signed)
Patient called stating that he returned your call.

## 2018-01-22 NOTE — Telephone Encounter (Signed)
Spoke with pt about his infusion claim. He states that received a bill in the mail for this infusion and it states that it was paid in full. He will continue to keep an eye on his bill going forward.   Emagene Merfeld, [place], CPhT 9:32 AM

## 2018-02-10 ENCOUNTER — Other Ambulatory Visit: Payer: Self-pay | Admitting: *Deleted

## 2018-02-10 NOTE — Progress Notes (Signed)
Infusion orders are current for patient CBC CMP Tylenol Benadryl appointments are up to date and follow up appointment  is scheduled TB gold not due yet.  

## 2018-02-13 ENCOUNTER — Other Ambulatory Visit (INDEPENDENT_AMBULATORY_CARE_PROVIDER_SITE_OTHER): Payer: Medicare Other

## 2018-02-13 ENCOUNTER — Ambulatory Visit (HOSPITAL_COMMUNITY)
Admission: RE | Admit: 2018-02-13 | Discharge: 2018-02-13 | Disposition: A | Discharge status: Home / Self Care | Payer: Medicare Other | Source: Ambulatory Visit | Attending: Rheumatology | Admitting: Rheumatology

## 2018-02-13 ENCOUNTER — Ambulatory Visit (INDEPENDENT_AMBULATORY_CARE_PROVIDER_SITE_OTHER): Payer: Medicare Other | Admitting: Internal Medicine

## 2018-02-13 ENCOUNTER — Encounter: Payer: Self-pay | Admitting: Internal Medicine

## 2018-02-13 VITALS — BP 120/80 | HR 67 | Ht 68.0 in | Wt 190.2 lb

## 2018-02-13 DIAGNOSIS — J330 Polyp of nasal cavity: Secondary | ICD-10-CM

## 2018-02-13 DIAGNOSIS — J4520 Mild intermittent asthma, uncomplicated: Secondary | ICD-10-CM

## 2018-02-13 DIAGNOSIS — K219 Gastro-esophageal reflux disease without esophagitis: Secondary | ICD-10-CM | POA: Diagnosis not present

## 2018-02-13 DIAGNOSIS — M0609 Rheumatoid arthritis without rheumatoid factor, multiple sites: Secondary | ICD-10-CM | POA: Insufficient documentation

## 2018-02-13 DIAGNOSIS — J4531 Mild persistent asthma with (acute) exacerbation: Secondary | ICD-10-CM | POA: Diagnosis not present

## 2018-02-13 LAB — CBC WITH DIFFERENTIAL/PLATELET
BASOS PCT: 0.6 % (ref 0.0–3.0)
Basophils Absolute: 0 10*3/uL (ref 0.0–0.1)
Eosinophils Absolute: 0.1 10*3/uL (ref 0.0–0.7)
Eosinophils Relative: 1.1 % (ref 0.0–5.0)
HEMATOCRIT: 43.9 % (ref 39.0–52.0)
Hemoglobin: 14.8 g/dL (ref 13.0–17.0)
LYMPHS PCT: 23.6 % (ref 12.0–46.0)
Lymphs Abs: 1.9 10*3/uL (ref 0.7–4.0)
MCHC: 33.8 g/dL (ref 30.0–36.0)
MCV: 95.8 fl (ref 78.0–100.0)
MONOS PCT: 11.8 % (ref 3.0–12.0)
Monocytes Absolute: 1 10*3/uL (ref 0.1–1.0)
NEUTROS ABS: 5.1 10*3/uL (ref 1.4–7.7)
Neutrophils Relative %: 62.9 % (ref 43.0–77.0)
PLATELETS: 288 10*3/uL (ref 150.0–400.0)
RBC: 4.59 Mil/uL (ref 4.22–5.81)
RDW: 13.6 % (ref 11.5–15.5)
WBC: 8.2 10*3/uL (ref 4.0–10.5)

## 2018-02-13 MED ORDER — FLUTICASONE-UMECLIDIN-VILANT 100-62.5-25 MCG/INH IN AEPB: 1.0000 | INHALATION_SPRAY | Freq: Every day | RESPIRATORY_TRACT | 0 refills | Status: DC

## 2018-02-13 MED ORDER — DIPHENHYDRAMINE HCL 25 MG PO CAPS
ORAL_CAPSULE | ORAL | Status: AC
  Filled 2018-02-13: qty 1

## 2018-02-13 MED ORDER — SODIUM CHLORIDE 0.9 % IV SOLN
750.0000 mg | INTRAVENOUS | Status: DC
  Administered 2018-02-13: 12:00:00 750 mg via INTRAVENOUS
  Filled 2018-02-13: qty 30

## 2018-02-13 MED ORDER — FLUTICASONE-UMECLIDIN-VILANT 100-62.5-25 MCG/INH IN AEPB: 1.0000 | INHALATION_SPRAY | Freq: Every day | RESPIRATORY_TRACT | 12 refills | Status: DC

## 2018-02-13 MED ORDER — ACETAMINOPHEN 325 MG PO TABS
650.0000 mg | ORAL_TABLET | ORAL | Status: DC
  Administered 2018-02-13: 650 mg via ORAL

## 2018-02-13 MED ORDER — ACETAMINOPHEN 325 MG PO TABS
ORAL_TABLET | ORAL | Status: AC
  Filled 2018-02-13: qty 2

## 2018-02-13 MED ORDER — DIPHENHYDRAMINE HCL 25 MG PO CAPS
25.0000 mg | ORAL_CAPSULE | ORAL | Status: DC
  Administered 2018-02-13: 25 mg via ORAL

## 2018-02-13 NOTE — Progress Notes (Signed)
HPI M former smoker followed for asthma, allergic rhinitis, hx rhinosinusitis, nasal polyps complicated by rheumatoid arthritis. FENO- 08/07/16- 25 Office spirometry 08/07/16- WNL, FEV1 3.59/ 110%, R 0.81 PFT 11/01/16- WNL-FVC 5.30/123%, FEV1 4.0/124%, ratio 0.75, FEF 25-75% 3.49/135%, TLC 131%, DLCO 99% ---------------------------------------------------------------  02/12/17- 68 year old male former smoker followed for asthma, allergic rhinitis, history rhinosinusitis, nasal polyps, complicated by rheumatoid arthritis/MTX/Humira, bipolar FOLLOWS FOR: Pt states he had a flare up about 2 weeks ago-was given abx and helped clear up. Review PFT with patient.  Probably respecting impact of his Humira, he finds that starting antibiotics early with onset of sinus infection helps prevent progression into his lungs. Feeling well today. Occasional use of rescue inhaler. Continues other meds as prescribed. CXR 10/15/16 IMPRESSION: Negative two view chest. PFT 11/01/16- WNL-FVC 5.30/123%, FEV1 4.0/124%, ratio 0.75, FEF 25-75% 3.49/135%, TLC 131%, DLCO 99%  02/13/2018- 67 year old male former smoker followed for asthma, allergic rhinitis, history rhinosinusitis, nasal polyps, complicated by rheumatoid arthritis/MTX/Humira, bipolar ----Asthma: Pt states he is having 1 to 3 flares with breathing a week and has to use rescue inhaler; has bad cough to the point of blacking out. Would like to discuss stem cell therapy. He continues to exercise vigorously but thinks he is more dyspneic with exertion over the past year.  Otherwise feels fine.  Admits some wheeze.  Had a significant flulike illness and possibly pneumonia 2 years ago.  Averaging 1-3 "attacks" per week during which he feels well then suddenly chokes, starts coughing and cannot get his breath.  Near tussive syncope.  Taking omeprazole once daily and continues Flonase and Breo. He brought a infomercial article about stem cell treatment of asthma which we  discussed.  ROS-see HPI    + = positive Constitutional:   No-   weight loss, night sweats, fevers, chills, fatigue, lassitude. HEENT:   No-  headaches, difficulty swallowing, tooth/dental problems, sore throat,       No-  sneezing, itching, ear ache, nasal congestion, post nasal drip,  CV:  No-   chest pain, orthopnea, PND, swelling in lower extremities, anasarca, dizziness, palpitations Resp: No-   shortness of breath with exertion or at rest.                productive cough,   non-productive cough,   coughing up of blood.                 change in color of mucus.    wheezing.   Skin: No rash GI:  heartburn, indigestion, abdominal pain, nausea, vomiting, GU:  MS: +  joint pain or swelling.   Neuro-     nothing unusual Psych:  No- change in mood or affect. No depression or anxiety.  No memory loss.  OBJ General- Alert, Oriented, Affect-appropriate, Distress- none acute,  Skin-  Lymphadenopathy- none Head- atraumatic            Eyes- Gross vision intact, PERRLA, conjunctivae clear secretions            Ears- Hearing, canals-normal            Nose-  no-Septal dev, mucus, polyps+ bilateral, erosion, perforation             Throat- Mallampati III , mucosa clear , drainage- none, tonsils- atrophic, Neck- flexible , trachea midline, no stridor , thyroid nl, carotid no bruit Chest - symmetrical excursion , unlabored           Heart/CV- RRR , no murmur , no gallop  ,  no rub, nl s1 s2                           - JVD- none , edema- none, stasis changes- none, varices- none           Lung- + clear, wheeze- none, cough -none                               dullness-none, rub- none           Chest wall-  Abd-  Br/ Gen/ Rectal- Not done, not indicated Extrem- cyanosis- none, clubbing, none, atrophy- none, strength- nl.                          +Synovial thickening of hand joints. Neuro- grossly intact to observation

## 2018-02-13 NOTE — Patient Instructions (Signed)
Sample and print script Trelegy     Inhale 1 puff then rinse mouth, once daily Try this instead of Breo. If you like it, fill the script.  Order- lab- CBC w diff, Allergy profile    Dx Asthma moderate persistent  Take your omeprazole twice daily for now, before breakfast and supper  Try putting a brick under each of the head legs of your bed to reduce the chance of reflux.

## 2018-02-14 ENCOUNTER — Other Ambulatory Visit: Payer: Self-pay | Admitting: Rheumatology

## 2018-02-14 LAB — RESPIRATORY ALLERGY PROFILE REGION II ~~LOC~~
Allergen, Cedar tree, t12: <0.1 kU/L
Allergen, D pternoyssinus,d7: <0.1 kU/L
Allergen, Mouse Urine Protein, e78: <0.1 kU/L
Allergen, Mulberry, t76: <0.1 kU/L
Allergen, Oak,t7: <0.1 kU/L
Allergen, P. notatum, m1: <0.1 kU/L
Aspergillus fumigatus, m3: <0.1 kU/L
CLADOSPORIUM HERBARUM (M2) IGE: <0.1 kU/L
CLASS: 0
CLASS: 0
CLASS: 0
CLASS: 0
CLASS: 0
CLASS: 0
CLASS: 0
CLASS: 0
COMMON RAGWEED (SHORT) (W1) IGE: <0.1 kU/L
Cat Dander: <0.1 kU/L
Class: 0
Class: 0
Class: 0
Class: 0
Class: 0
Class: 0
Class: 0
Class: 0
Class: 0
Class: 0
Class: 0
Class: 0
Class: 0
Class: 0
Class: 0
Class: 0
Cockroach: <0.1 kU/L
IGE (IMMUNOGLOBULIN E), SERUM: 63 kU/L (ref <=114)
Rough Pigweed  IgE: <0.1 kU/L
Timothy Grass: <0.1 kU/L

## 2018-02-14 LAB — INTERPRETATION:

## 2018-02-16 NOTE — Assessment & Plan Note (Signed)
I believe I am seeing bilateral nasal polyps superiorly.  He has had extensive sinus surgery but may need it again.  He will continue Flonase.

## 2018-02-16 NOTE — Assessment & Plan Note (Signed)
I am pretty sure reflux events are triggering his "asthma attacks".  We discussed elevation head of bed and other antireflux measures.  He will increase omeprazole to twice daily before meals.

## 2018-02-16 NOTE — Assessment & Plan Note (Signed)
I think his "attacks" are reflux triggered.  The article he brought about stem cell therapy for asthma was "bogus" in my opinion and I explained this. Plan-replace Breo with Trelegy.  Labs for eosinophil and IgE assessment to see if he could be a candidate for 1 of the Biologics.  Increased management for GERD.

## 2018-02-17 ENCOUNTER — Telehealth: Payer: Self-pay | Admitting: Rheumatology

## 2018-02-17 ENCOUNTER — Telehealth: Payer: Self-pay | Admitting: Internal Medicine

## 2018-02-17 MED ORDER — METHOTREXATE 2.5 MG PO TABS: 12.5000 mg | ORAL_TABLET | ORAL | 0 refills | Status: DC

## 2018-02-17 NOTE — Telephone Encounter (Signed)
Cording to your last office note patient has discontinued methotrexate as he did not like the fatigue from it. Patient states at his last appointment he discussed possibly discontinuing the medication, but after talking to Dr. Estanislado Pandy  has decided to continue taking the Methotrexate.     Last Visit:  12/24/17 Next visit:  05/29/18 Labs: 01/16/18 WNL  Do you want to refill the MTX?

## 2018-02-17 NOTE — Telephone Encounter (Signed)
ATC patient-no answer and no VM set up. Will need to try again later.

## 2018-02-17 NOTE — Telephone Encounter (Signed)
Patient called requesting prescription refill of Methotrexate.  Patient's pharmacy is [facility] in [place].  Patient states at his last appointment he discussed possibly discontinuing the medication, but after talking to Dr. Estanislado Pandy  has decided to continue taking the Methotrexate.

## 2018-02-17 NOTE — Telephone Encounter (Signed)
Ok to refill 

## 2018-02-18 NOTE — Telephone Encounter (Signed)
Attempted to contact pt. I did not receive an answer. There was no option for me to leave a message. Will try back.  

## 2018-02-19 NOTE — Telephone Encounter (Signed)
Attempted to contact pt. I did not receive an answer. There was no option for me to leave a message. Will try back.  

## 2018-02-20 NOTE — Telephone Encounter (Signed)
We have attempted to contact the pt several times with no success or call back from the pt. Per triage protocol, message will be closed.  

## 2018-02-28 ENCOUNTER — Other Ambulatory Visit: Payer: Self-pay | Admitting: *Deleted

## 2018-02-28 ENCOUNTER — Telehealth: Payer: Self-pay | Admitting: Internal Medicine

## 2018-02-28 ENCOUNTER — Telehealth: Payer: Self-pay | Admitting: Pulmonary Disease

## 2018-02-28 MED ORDER — FOLIC ACID 1 MG PO TABS: 2.0000 mg | ORAL_TABLET | Freq: Every day | ORAL | 3 refills | Status: DC

## 2018-02-28 MED ORDER — AMOXICILLIN-POT CLAVULANATE 875-125 MG PO TABS: 1.0000 | ORAL_TABLET | Freq: Two times a day (BID) | ORAL | 0 refills | Status: DC

## 2018-02-28 NOTE — Telephone Encounter (Signed)
Refill request received via fax  Last Visit: 12/24/17 Next visit: 05/29/18  Okay to refill per Dr. Estanislado Pandy

## 2018-02-28 NOTE — Telephone Encounter (Signed)
Spoke with Manuela Schwartz  She states that the phone number to call for PA [phone] pt ID number is [phone]  Called to initiate PA  Pt has tried and failed breo, anoro and qvar   Trelegy approved from 01/29/18-02/28/19 case number is [phone]  Called pharm and notified Manuela Schwartz that this was approved

## 2018-02-28 NOTE — Telephone Encounter (Signed)
Has sinus congestion and pressure with headache.  Having yellow green drainage.  Gets sinus infections frequently, and usually takes ABx to clear these up.  Will send script for augmentin.

## 2018-03-07 ENCOUNTER — Telehealth: Payer: Self-pay | Admitting: Internal Medicine

## 2018-03-07 MED ORDER — AMOXICILLIN-POT CLAVULANATE 875-125 MG PO TABS: 1.0000 | ORAL_TABLET | Freq: Two times a day (BID) | ORAL | 0 refills | Status: DC

## 2018-03-07 NOTE — Telephone Encounter (Signed)
Called and spoke to patient. Patient stated that he is taking his last does of Augmentin today continues to not feel well. Patient stated that he is having headaches, weakness, above his eyes he is having sinus pressure and congestion with mucous that is yellow/green.  Patient would like to see if another dose of antibiotics can be sent in for him.   Patient would also like to thank CY for all his help!  He stated since starting the Trelegy and doing things suggested for GERD that he has not had any issues with his asthma.   CY please advise. Thank you!  Allergies  Allergen Reactions  . Simponi [Golimumab]     Repeated infections  . Aspirin     REACTION: throat swelling  . Other Other (See Comments)    REACTION: oral sores  . Sulfonamide Derivatives     REACTION: oral sores  . Theophyllines    Current Outpatient Medications on File Prior to Visit  Medication Sig Dispense Refill  . Abatacept (ORENCIA) 125 MG/ML SOSY Inject into the skin.    Marland Kitchen albuterol (PROAIR HFA) 108 (90 Base) MCG/ACT inhaler INHALE TWO PUFFS BY MOUTH EVERY 4 HOURS 8.5 each 4  . amoxicillin-clavulanate (AUGMENTIN) 875-125 MG tablet Take 1 tablet by mouth 2 (two) times daily. 14 tablet 0  . BREO ELLIPTA 200-25 MCG/INH AEPB INHALE ONE DOSE BY MOUTH DAILY 60 each 11  . cholestyramine (QUESTRAN) 4 GM/DOSE powder     . clonazePAM (KLONOPIN) 1 MG tablet Take 1 tablet by mouth daily.    Marland Kitchen Fexofenadine HCl (ALLEGRA PO) Take by mouth.    . fluticasone (FLONASE) 50 MCG/ACT nasal spray Place 1 spray into both nostrils daily. 16 g 11  . Fluticasone-Umeclidin-Vilant (TRELEGY ELLIPTA) 100-62.5-25 MCG/INH AEPB Inhale 1 puff into the lungs daily. 60 each 12  . Fluticasone-Umeclidin-Vilant (TRELEGY ELLIPTA) 100-62.5-25 MCG/INH AEPB Inhale 1 puff into the lungs daily. 1 each 0  . folic acid (FOLVITE) 1 MG tablet Take 2 tablets (2 mg total) by mouth daily. 180 tablet 3  . lithium carbonate 300 MG capsule Take 300 mg by mouth 3 (three)  times daily.     Marland Kitchen losartan-hydrochlorothiazide (HYZAAR) 100-25 MG tablet     . methotrexate (RHEUMATREX) 2.5 MG tablet Take 5 tablets (12.5 mg total) by mouth once a week. Caution:Chemotherapy. Protect from light. 60 tablet 0  . montelukast (SINGULAIR) 10 MG tablet TAKE 1 TABLET DAILY 90 tablet 3  . Multiple Vitamins-Minerals (MULTIVITAMIN WITH MINERALS) tablet Take by mouth.    . nystatin (MYCOSTATIN) 100000 UNIT/ML suspension as needed.     . Probiotic Product (PROBIOTIC PO) Take by mouth.    . sildenafil (VIAGRA) 100 MG tablet Take 100 mg by mouth daily as needed.      . Testosterone 20.25 MG/ACT (1.62%) GEL Apply 2 pumps as directed once a day     No current facility-administered medications on file prior to visit.

## 2018-03-07 NOTE — Telephone Encounter (Signed)
Called and spoke to patient. Let him know CY's suggestions. Patient stated he thinks he should follow up with Dr. Erik Obey, his ENT, as well. Sent in Rx to preferred pharmacy. Nothing further is needed.

## 2018-03-07 NOTE — Telephone Encounter (Signed)
Ok to repeat augmentin 875 mg- give # 20 this time,  1 twice daily x 10 days. If this doesn't take care of it, he may need to see his ENT doctor.

## 2018-03-12 ENCOUNTER — Other Ambulatory Visit: Payer: Self-pay | Admitting: *Deleted

## 2018-03-12 ENCOUNTER — Encounter (HOSPITAL_COMMUNITY): Payer: Medicare Other

## 2018-03-12 DIAGNOSIS — M0609 Rheumatoid arthritis without rheumatoid factor, multiple sites: Secondary | ICD-10-CM

## 2018-03-13 ENCOUNTER — Ambulatory Visit (HOSPITAL_COMMUNITY)
Admission: RE | Admit: 2018-03-13 | Discharge: 2018-03-13 | Disposition: A | Discharge status: Home / Self Care | Payer: Medicare Other | Source: Ambulatory Visit | Attending: Rheumatology | Admitting: Rheumatology

## 2018-03-13 DIAGNOSIS — M0609 Rheumatoid arthritis without rheumatoid factor, multiple sites: Secondary | ICD-10-CM | POA: Insufficient documentation

## 2018-03-13 MED ORDER — DIPHENHYDRAMINE HCL 25 MG PO CAPS: 25.0000 mg | ORAL_CAPSULE | ORAL | Status: DC

## 2018-03-13 MED ORDER — SODIUM CHLORIDE 0.9 % IV SOLN: 750.0000 mg | INTRAVENOUS | Status: DC

## 2018-03-13 MED ORDER — ACETAMINOPHEN 325 MG PO TABS: 650.0000 mg | ORAL_TABLET | ORAL | Status: DC

## 2018-03-13 NOTE — Progress Notes (Signed)
Pt informed us today that he is on antibiotics and has 8 days left.  Per [facility] we will with hold treatment and he will come to his next appointment

## 2018-04-04 ENCOUNTER — Telehealth: Payer: Self-pay | Admitting: Internal Medicine

## 2018-04-04 NOTE — Telephone Encounter (Signed)
Attempted to call the patient to return phone call, no voicemail set up at this time. Will try to call back at later date.  Pt was calling to report to CY that he is feeling better, and keeping his f/u with CY 04/26/18.  Routing to Wallace for review.

## 2018-04-11 ENCOUNTER — Other Ambulatory Visit: Payer: Self-pay | Admitting: *Deleted

## 2018-04-11 ENCOUNTER — Ambulatory Visit (HOSPITAL_COMMUNITY)
Admission: RE | Admit: 2018-04-11 | Discharge: 2018-04-11 | Disposition: A | Discharge status: Home / Self Care | Payer: Medicare Other | Source: Ambulatory Visit | Attending: Rheumatology | Admitting: Rheumatology

## 2018-04-11 DIAGNOSIS — M0609 Rheumatoid arthritis without rheumatoid factor, multiple sites: Secondary | ICD-10-CM | POA: Diagnosis not present

## 2018-04-11 LAB — CBC
HEMATOCRIT: 42.2 % (ref 39.0–52.0)
Hemoglobin: 13.3 g/dL (ref 13.0–17.0)
MCH: 31.1 pg (ref 26.0–34.0)
MCHC: 31.5 g/dL (ref 30.0–36.0)
MCV: 98.8 fL (ref 78.0–100.0)
PLATELETS: 278 10*3/uL (ref 150–400)
RBC: 4.27 MIL/uL (ref 4.22–5.81)
RDW: 13 % (ref 11.5–15.5)
WBC: 6 10*3/uL (ref 4.0–10.5)

## 2018-04-11 LAB — COMPREHENSIVE METABOLIC PANEL
ALBUMIN: 3.7 g/dL (ref 3.5–5.0)
ALT: 27 U/L (ref 0–44)
ANION GAP: 7 (ref 5–15)
AST: 28 U/L (ref 15–41)
Alkaline Phosphatase: 63 U/L (ref 38–126)
BILIRUBIN TOTAL: 0.7 mg/dL (ref 0.3–1.2)
BUN: 11 mg/dL (ref 8–23)
CO2: 25 mmol/L (ref 22–32)
Calcium: 9.3 mg/dL (ref 8.9–10.3)
Chloride: 106 mmol/L (ref 98–111)
Creatinine, Ser: 1.02 mg/dL (ref 0.61–1.24)
GFR calc Af Amer: >60 mL/min (ref >60)
GFR calc non Af Amer: >60 mL/min (ref >60)
Glucose, Bld: 78 mg/dL (ref 70–99)
POTASSIUM: 3.8 mmol/L (ref 3.5–5.1)
Sodium: 138 mmol/L (ref 135–145)
TOTAL PROTEIN: 6.7 g/dL (ref 6.5–8.1)

## 2018-04-11 MED ORDER — ACETAMINOPHEN 325 MG PO TABS: 650.0000 mg | ORAL_TABLET | ORAL | Status: DC

## 2018-04-11 MED ORDER — SODIUM CHLORIDE 0.9 % IV SOLN
750.0000 mg | INTRAVENOUS | Status: DC
  Administered 2018-04-11: 750 mg via INTRAVENOUS
  Filled 2018-04-11: qty 30

## 2018-04-11 MED ORDER — DIPHENHYDRAMINE HCL 25 MG PO CAPS: 25.0000 mg | ORAL_CAPSULE | ORAL | Status: DC

## 2018-04-28 ENCOUNTER — Encounter: Payer: Self-pay | Admitting: Internal Medicine

## 2018-04-28 ENCOUNTER — Other Ambulatory Visit: Payer: Self-pay | Admitting: *Deleted

## 2018-04-28 ENCOUNTER — Ambulatory Visit (INDEPENDENT_AMBULATORY_CARE_PROVIDER_SITE_OTHER): Payer: Medicare Other | Admitting: Internal Medicine

## 2018-04-28 DIAGNOSIS — M0609 Rheumatoid arthritis without rheumatoid factor, multiple sites: Secondary | ICD-10-CM

## 2018-04-28 DIAGNOSIS — K219 Gastro-esophageal reflux disease without esophagitis: Secondary | ICD-10-CM | POA: Diagnosis not present

## 2018-04-28 NOTE — Progress Notes (Signed)
Infusion orders are current for patient CBC CMP Tylenol Benadryl appointments are up to date and follow up appointment  is scheduled TB gold not due yet.  

## 2018-04-28 NOTE — Progress Notes (Signed)
HPI M former smoker followed for asthma, allergic rhinitis, hx rhinosinusitis, nasal polyps complicated by rheumatoid arthritis. FENO- 08/07/16- 25 Office spirometry 08/07/16- WNL, FEV1 3.59/ 110%, R 0.81 PFT 11/01/16- WNL-FVC 5.30/123%, FEV1 4.0/124%, ratio 0.75, FEF 25-75% 3.49/135%, TLC 131%, DLCO 99% ---------------------------------------------------------------  02/13/2018- 67 year old male former smoker followed for asthma, allergic rhinitis, history rhinosinusitis, nasal polyps, complicated by rheumatoid arthritis/MTX/Humira, bipolar ----Asthma: Pt states he is having 1 to 3 flares with breathing a week and has to use rescue inhaler; has bad cough to the point of blacking out. Would like to discuss stem cell therapy. He continues to exercise vigorously but thinks he is more dyspneic with exertion over the past year.  Otherwise feels fine.  Admits some wheeze.  Had a significant flulike illness and possibly pneumonia 2 years ago.  Averaging 1-3 "attacks" per week during which he feels well then suddenly chokes, starts coughing and cannot get his breath.  Near tussive syncope.  Taking omeprazole once daily and continues Flonase and Breo. He brought a infomercial article about stem cell treatment of asthma which we discussed.  04/28/2018- 67 year old male former smoker followed for asthma, allergic rhinitis, history rhinosinusitis, nasal polyps, complicated by rheumatoid arthritis/MTX/Humira, bipolar, GERD Trelegy approved by PA after failing breo, anoro, Qvar. Was going to f/u w Dr Erik Obey ENT for sinutitis after augmentin. Lab 5/9- Resp Allergy Profile IgE 63, no elevations. EOS 1.1% -----Asthma: Pt states he has been doing well overall and feels that Trelegy is working great.  Trelegy, singulair, Proair hfa,  Has felt very much better with Trelegy and resumption of his antireflux measures.  Head of bed is now up on blocks and he is currently using omeprazole twice daily.  Sinusitis finally  responded to extended Augmentin.  ROS-see HPI    + = positive Constitutional:   No-   weight loss, night sweats, fevers, chills, fatigue, lassitude. HEENT:   No-  headaches, difficulty swallowing, tooth/dental problems, sore throat,       No-  sneezing, itching, ear ache, nasal congestion, post nasal drip,  CV:  No-   chest pain, orthopnea, PND, swelling in lower extremities, anasarca, dizziness, palpitations Resp: No-   shortness of breath with exertion or at rest.                productive cough,   non-productive cough,   coughing up of blood.                 change in color of mucus.    wheezing.   Skin: No rash GI:  heartburn, indigestion, abdominal pain, nausea, vomiting, GU:  MS: +  joint pain or swelling.   Neuro-     nothing unusual Psych:  No- change in mood or affect. No depression or anxiety.  No memory loss.  OBJ General- Alert, Oriented, Affect-appropriate, Distress- none acute,  Skin-  Lymphadenopathy- none Head- atraumatic            Eyes- Gross vision intact, PERRLA, conjunctivae clear secretions            Ears- Hearing, canals-normal            Nose-  no-Septal dev, mucus, polyps+ bilateral, erosion, perforation             Throat- Mallampati III , mucosa clear , drainage- none, tonsils- atrophic, Neck- flexible , trachea midline, no stridor , thyroid nl, carotid no bruit Chest - symmetrical excursion , unlabored  Heart/CV- RRR , no murmur , no gallop  , no rub, nl s1 s2                           - JVD- none , edema- none, stasis changes- none, varices- none           Lung- + clear, wheeze- none, cough -none                               dullness-none, rub- none           Chest wall-  Abd-  Br/ Gen/ Rectal- Not done, not indicated Extrem- cyanosis- none, clubbing, none, atrophy- none, strength- nl.                          +Synovial thickening of hand joints. Neuro- grossly intact to observation

## 2018-04-28 NOTE — Patient Instructions (Signed)
Suggest you continue current treatment and meds.  Please call if we can help

## 2018-04-30 NOTE — Assessment & Plan Note (Signed)
Reflux and recurrent sinusitis are both factors.  He is doing well currently and appreciates Trelegy.  If he continues to require that, he may be best categorized as mild or moderate persistent asthmatic bronchitis. Plan-continue Trelegy

## 2018-04-30 NOTE — Assessment & Plan Note (Signed)
He has followed advice an elevated head of bed with blocks.  As predicted, he now finds that he is not waking at night with coughing episodes and choking.

## 2018-04-30 NOTE — Assessment & Plan Note (Signed)
Immunosuppressive therapies are likely to impact immune defenses and contribute to his recurrent airway infections.

## 2018-05-08 ENCOUNTER — Encounter (HOSPITAL_COMMUNITY)
Admission: RE | Admit: 2018-05-08 | Discharge: 2018-05-08 | Disposition: A | Discharge status: Home / Self Care | Payer: Medicare Other | Source: Ambulatory Visit | Attending: Rheumatology | Admitting: Rheumatology

## 2018-05-08 DIAGNOSIS — M0609 Rheumatoid arthritis without rheumatoid factor, multiple sites: Secondary | ICD-10-CM | POA: Diagnosis not present

## 2018-05-08 MED ORDER — DIPHENHYDRAMINE HCL 25 MG PO CAPS
25.0000 mg | ORAL_CAPSULE | ORAL | Status: DC
  Administered 2018-05-08: 25 mg via ORAL

## 2018-05-08 MED ORDER — ACETAMINOPHEN 325 MG PO TABS
650.0000 mg | ORAL_TABLET | ORAL | Status: DC
  Administered 2018-05-08: 650 mg via ORAL

## 2018-05-08 MED ORDER — DIPHENHYDRAMINE HCL 25 MG PO CAPS
ORAL_CAPSULE | ORAL | Status: AC
  Administered 2018-05-08: 25 mg via ORAL
  Filled 2018-05-08: qty 1

## 2018-05-08 MED ORDER — SODIUM CHLORIDE 0.9 % IV SOLN
750.0000 mg | INTRAVENOUS | Status: DC
  Administered 2018-05-08: 750 mg via INTRAVENOUS
  Filled 2018-05-08: qty 30

## 2018-05-08 MED ORDER — ACETAMINOPHEN 325 MG PO TABS
ORAL_TABLET | ORAL | Status: AC
  Administered 2018-05-08: 650 mg via ORAL
  Filled 2018-05-08: qty 2

## 2018-05-26 DIAGNOSIS — F319 Bipolar disorder, unspecified: Secondary | ICD-10-CM | POA: Diagnosis not present

## 2018-05-26 DIAGNOSIS — K219 Gastro-esophageal reflux disease without esophagitis: Secondary | ICD-10-CM | POA: Diagnosis not present

## 2018-05-26 DIAGNOSIS — Z0000 Encounter for general adult medical examination without abnormal findings: Secondary | ICD-10-CM | POA: Diagnosis not present

## 2018-05-26 DIAGNOSIS — M069 Rheumatoid arthritis, unspecified: Secondary | ICD-10-CM | POA: Diagnosis not present

## 2018-05-26 DIAGNOSIS — Z13220 Encounter for screening for lipoid disorders: Secondary | ICD-10-CM | POA: Diagnosis not present

## 2018-05-26 DIAGNOSIS — Z23 Encounter for immunization: Secondary | ICD-10-CM | POA: Diagnosis not present

## 2018-05-26 DIAGNOSIS — Z125 Encounter for screening for malignant neoplasm of prostate: Secondary | ICD-10-CM | POA: Diagnosis not present

## 2018-05-26 DIAGNOSIS — J45909 Unspecified asthma, uncomplicated: Secondary | ICD-10-CM | POA: Diagnosis not present

## 2018-05-26 DIAGNOSIS — Z136 Encounter for screening for cardiovascular disorders: Secondary | ICD-10-CM | POA: Diagnosis not present

## 2018-05-26 DIAGNOSIS — Z1389 Encounter for screening for other disorder: Secondary | ICD-10-CM | POA: Diagnosis not present

## 2018-05-26 DIAGNOSIS — I10 Essential (primary) hypertension: Secondary | ICD-10-CM | POA: Diagnosis not present

## 2018-05-26 DIAGNOSIS — F329 Major depressive disorder, single episode, unspecified: Secondary | ICD-10-CM | POA: Diagnosis not present

## 2018-05-26 NOTE — Progress Notes (Signed)
Office Visit Note  Patient: [name] [name]             Date of Birth: [birth year]           MRN: [id number]             PCP: Wenda Low, MD Referring: Wenda Low, MD Visit Date: 05/29/2018 Occupation: @GUAROCC @  Subjective:  Medication monitoring   History of Present Illness: [name] [name] is a 67 y.o. male with history of seronegative rheumatoid arthritis.  Patient is on Orenica IV infusions once a month.  Patient reports that he discontinued methotrexate about 3 weeks ago due to feeling like he is having recurrent URIs while on Orencia and methotrexate.  He states that he has not noticed any difference since his continue methotrexate.  Has no joint pain or joint swelling at this time.  He has not had any recent rheumatoid arthritis flares.  He denies any joint stiffness at this time.  He feels that IV Orencia infusions have been controlling his rheumatoid arthritis well.       Activities of Daily Living:  Patient reports morning stiffness for 0 minutes.   Patient Denies nocturnal pain.  Difficulty dressing/grooming: Denies Difficulty climbing stairs: Denies Difficulty getting out of chair: Reports Difficulty using hands for taps, buttons, cutlery, and/or writing: Reports  Review of Systems  Constitutional: Positive for fatigue. Negative for night sweats.  HENT: Negative for mouth sores, trouble swallowing, trouble swallowing, mouth dryness and nose dryness.   Eyes: Negative for redness, visual disturbance and dryness.  Respiratory: Positive for cough. Negative for hemoptysis, shortness of breath and difficulty breathing.   Cardiovascular: Negative for chest pain, palpitations, hypertension, irregular heartbeat and swelling in legs/feet.  Gastrointestinal: Negative for blood in stool, constipation and diarrhea.  Endocrine: Negative for increased urination.  Genitourinary: Negative for painful urination.  Musculoskeletal: Negative for arthralgias, joint pain, joint  swelling, myalgias, muscle weakness, morning stiffness, muscle tenderness and myalgias.  Skin: Negative for color change, rash, hair loss, nodules/bumps, skin tightness, ulcers and sensitivity to sunlight.  Allergic/Immunologic: Negative for susceptible to infections.  Neurological: Negative for dizziness, fainting, memory loss, night sweats and weakness.  Hematological: Negative for swollen glands.  Psychiatric/Behavioral: Positive for depressed mood. Negative for sleep disturbance. The patient is not nervous/anxious.     PMFS History:  Patient Active Problem List   Diagnosis Date Noted  . History of gastroesophageal reflux (GERD) 05/29/2018  . High risk medication use 05/29/2018  . History of IBS 05/29/2018  . History of depression 07/15/2017  . History of asthma 07/08/2017  . 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 Asthmatic bronchitis, mild intermittent, uncomplicated 40/07/2724  . Asthmatic bronchitis with acute exacerbation 10/15/2016  . Herpes zoster 08/01/2013  . Rheumatoid arthritis of multiple sites without rheumatoid factor ([place]) 09/18/2010  . NASAL POLYP 12/24/2007  . Sinusitis, chronic 12/24/2007  . Seasonal and perennial allergic rhinitis 12/24/2007  . Allergic-infective asthma 12/24/2007  . G E R D 12/24/2007    Past Medical History:  Diagnosis Date  . ALLERGIC RHINITIS   . Asthma   . GERD (gastroesophageal reflux disease)   . Rheumatoid arthritis(714.0)     Family History  Problem Relation Age of Onset  . Multiple sclerosis Father   . Breast cancer Mother   . Breast cancer Sister    Past Surgical History:  Procedure Laterality Date  . extensive sinus surgery with ablation of the frontal sinuses    . HERNIA REPAIR    .  nasal polypectomies     Social History   Social History Narrative  . Not on file    Objective: Vital Signs: BP 131/81 (BP Location: Left Arm, Patient Position: Sitting, Cuff Size: Normal)   Pulse 62   Resp 14   Ht  5\' 8"  (1.727 m)   Wt 194 lb (88 kg)   BMI 29.50 kg/m    Physical Exam  Constitutional: He is oriented to person, place, and time. He appears well-developed and well-nourished.  HENT:  Head: Normocephalic and atraumatic.  Eyes: Pupils are equal, round, and reactive to light. Conjunctivae and EOM are normal.  Neck: Normal range of motion. Neck supple.  Cardiovascular: Normal rate, regular rhythm and normal heart sounds.  Pulmonary/Chest: Effort normal and breath sounds normal.  Abdominal: Soft. Bowel sounds are normal.  Lymphadenopathy:    He has no cervical adenopathy.  Neurological: He is alert and oriented to person, place, and time.  Skin: Skin is warm and dry. Capillary refill takes less than 2 seconds.  Psychiatric: He has a normal mood and affect. His behavior is normal.  Nursing note and vitals reviewed.    Musculoskeletal Exam: C-spine limited range of motion.  Thoracic and lumbar spine good range of motion.  No midline spinal tenderness.  No SI joint tenderness.  Shoulder joints full range of motion with no discomfort.  Elbow joints good range of motion.  Limited range of motion of bilateral wrist.  Has synovial thickening of MCP joints but no active synovitis was noted.  He has DIP and PIP synovial thickening consistent with osteoarthritis of bilateral hands.  Hip joints, knee joints, ankle joints, MTPs, PIPs, DIPs good range of motion with no synovitis.  No warmth or effusion of bilateral knee joints.  No tenderness or elevation of ankle joints.  He has PIP and DIP synovial thickening consistent with osteoarthritis of bilateral feet.  No tenderness of trochanteric bursa bilaterally.   CDAI Exam: CDAI Score: 0.2  Patient Global Assessment: 1 (mm); Provider Global Assessment: 1 (mm) Swollen: 0 ; Tender: 0  Joint Exam   Not documented   There is currently no information documented on the homunculus. Go to the Rheumatology activity and complete the homunculus joint  exam.  Investigation: No additional findings.  Imaging: No results found.  Recent Labs: Lab Results  Component Value Date   WBC 6.0 04/11/2018   HGB 13.3 04/11/2018   PLT 278 04/11/2018   NA 138 04/11/2018   K 3.8 04/11/2018   CL 106 04/11/2018   CO2 25 04/11/2018   GLUCOSE 78 04/11/2018   BUN 11 04/11/2018   CREATININE 1.02 04/11/2018   BILITOT 0.7 04/11/2018   ALKPHOS 63 04/11/2018   AST 28 04/11/2018   ALT 27 04/11/2018   PROT 6.7 04/11/2018   ALBUMIN 3.7 04/11/2018   CALCIUM 9.3 04/11/2018   GFRAA >60 04/11/2018   QFTBGOLD NEGATIVE 07/16/2017    Speciality Comments: No specialty comments available.  Procedures:  No procedures performed Allergies: Simponi [golimumab]; Aspirin; Other; Sulfonamide derivatives; and Theophyllines   Assessment / Plan:     Visit Diagnoses: Rheumatoid arthritis of multiple sites without rheumatoid factor ([place]): He has no active synovitis.  He has not had any recent rheumatoid arthritis flares.  He has no joint pain or joint swelling at this time.  He has no joint stiffness.  He is clinically been doing well on Orencia IV infusions once monthly.  He discontinued methotrexate about 3 weeks ago due to the immunosuppression.  He will continue on the current treatment regimen of Orencia infusions every 30 days.  He was advised to notify us if he develops increased joint pain or joint swelling.  He will follow-up in the office in 5 months.  High risk medication use - Orencia IV -CBC and CMP are within normal limits on 04/11/2018.  We will fax labs to his psychiatrist.  He has lab work with his infusions.  TB gold will be due in October 2019.  Plan: CBC with Differential/Platelet, COMPLETE METABOLIC PANEL WITH GFR, QuantiFERON-TB Gold Plus  Other medical conditions are listed as follows:  History of depression  History of gastroesophageal reflux (GERD)  History of IBS  History of asthma   Orders: No orders of the defined types were placed in  this encounter.  No orders of the defined types were placed in this encounter.   Face-to-face time spent with patient was 30 minutes. Greater than 50% of time was spent in counseling and coordination of care.  Follow-Up Instructions: Return in about 5 months (around 10/29/2018) for Rheumatoid arthritis.   Ofilia Neas, PA-C   I examined and evaluated the patient with Hazel Sams PA.  Patient had no synovitis on my examination today.  The plan of care was discussed as noted above.  Bo Merino, MD  Note - This record has been created using Editor, commissioning.  Chart creation errors have been sought, but may not always  have been located. Such creation errors do not reflect on  the standard of medical care.

## 2018-05-29 ENCOUNTER — Encounter: Payer: Self-pay | Admitting: Rheumatology

## 2018-05-29 ENCOUNTER — Ambulatory Visit (INDEPENDENT_AMBULATORY_CARE_PROVIDER_SITE_OTHER): Payer: Medicare Other | Admitting: Rheumatology

## 2018-05-29 ENCOUNTER — Other Ambulatory Visit: Payer: Self-pay | Admitting: *Deleted

## 2018-05-29 VITALS — BP 131/81 | HR 62 | Resp 14 | Ht 68.0 in | Wt 194.0 lb

## 2018-05-29 DIAGNOSIS — M0609 Rheumatoid arthritis without rheumatoid factor, multiple sites: Secondary | ICD-10-CM

## 2018-05-29 DIAGNOSIS — Z8709 Personal history of other diseases of the respiratory system: Secondary | ICD-10-CM | POA: Diagnosis not present

## 2018-05-29 DIAGNOSIS — Z8719 Personal history of other diseases of the digestive system: Secondary | ICD-10-CM

## 2018-05-29 DIAGNOSIS — Z79899 Other long term (current) drug therapy: Secondary | ICD-10-CM

## 2018-05-29 DIAGNOSIS — Z8659 Personal history of other mental and behavioral disorders: Secondary | ICD-10-CM | POA: Diagnosis not present

## 2018-05-29 HISTORY — DX: Personal history of other diseases of the digestive system: Z87.19

## 2018-05-29 NOTE — Progress Notes (Signed)
Infusion orders are current for patient CBC CMP Tylenol Benadryl appointments are up to date and follow up appointment  is scheduled TB gold not due yet.  

## 2018-06-05 ENCOUNTER — Ambulatory Visit (HOSPITAL_COMMUNITY)
Admission: RE | Admit: 2018-06-05 | Discharge: 2018-06-05 | Disposition: A | Discharge status: Home / Self Care | Payer: Medicare Other | Source: Ambulatory Visit | Attending: Rheumatology | Admitting: Rheumatology

## 2018-06-05 ENCOUNTER — Other Ambulatory Visit: Payer: Self-pay | Admitting: *Deleted

## 2018-06-05 DIAGNOSIS — M0609 Rheumatoid arthritis without rheumatoid factor, multiple sites: Secondary | ICD-10-CM | POA: Insufficient documentation

## 2018-06-05 LAB — COMPREHENSIVE METABOLIC PANEL
ALBUMIN: 3.9 g/dL (ref 3.5–5.0)
ALT: 25 U/L (ref 0–44)
ANION GAP: 9 (ref 5–15)
AST: 26 U/L (ref 15–41)
Alkaline Phosphatase: 61 U/L (ref 38–126)
BILIRUBIN TOTAL: 0.7 mg/dL (ref 0.3–1.2)
BUN: 12 mg/dL (ref 8–23)
CHLORIDE: 105 mmol/L (ref 98–111)
CO2: 24 mmol/L (ref 22–32)
Calcium: 9.3 mg/dL (ref 8.9–10.3)
Creatinine, Ser: 1.03 mg/dL (ref 0.61–1.24)
GFR calc Af Amer: >60 mL/min (ref >60)
GFR calc non Af Amer: >60 mL/min (ref >60)
GLUCOSE: 74 mg/dL (ref 70–99)
POTASSIUM: 4 mmol/L (ref 3.5–5.1)
Sodium: 138 mmol/L (ref 135–145)
TOTAL PROTEIN: 7.1 g/dL (ref 6.5–8.1)

## 2018-06-05 LAB — CBC
HEMATOCRIT: 43.5 % (ref 39.0–52.0)
Hemoglobin: 13.8 g/dL (ref 13.0–17.0)
MCH: 31.4 pg (ref 26.0–34.0)
MCHC: 31.7 g/dL (ref 30.0–36.0)
MCV: 99.1 fL (ref 78.0–100.0)
Platelets: 306 10*3/uL (ref 150–400)
RBC: 4.39 MIL/uL (ref 4.22–5.81)
RDW: 12.6 % (ref 11.5–15.5)
WBC: 5 10*3/uL (ref 4.0–10.5)

## 2018-06-05 MED ORDER — ACETAMINOPHEN 325 MG PO TABS
ORAL_TABLET | ORAL | Status: AC
  Filled 2018-06-05: qty 2

## 2018-06-05 MED ORDER — DIPHENHYDRAMINE HCL 25 MG PO CAPS
25.0000 mg | ORAL_CAPSULE | ORAL | Status: DC
  Filled 2018-06-05: qty 1

## 2018-06-05 MED ORDER — SODIUM CHLORIDE 0.9 % IV SOLN
750.0000 mg | INTRAVENOUS | Status: DC
  Administered 2018-06-05: 750 mg via INTRAVENOUS
  Filled 2018-06-05: qty 30

## 2018-06-05 MED ORDER — ACETAMINOPHEN 325 MG PO TABS: 650.0000 mg | ORAL_TABLET | ORAL | Status: DC

## 2018-06-05 NOTE — Progress Notes (Signed)
WNL

## 2018-06-13 DIAGNOSIS — Z23 Encounter for immunization: Secondary | ICD-10-CM | POA: Diagnosis not present

## 2018-06-23 DIAGNOSIS — E230 Hypopituitarism: Secondary | ICD-10-CM | POA: Diagnosis not present

## 2018-06-23 DIAGNOSIS — R5382 Chronic fatigue, unspecified: Secondary | ICD-10-CM | POA: Diagnosis not present

## 2018-07-03 ENCOUNTER — Ambulatory Visit (HOSPITAL_COMMUNITY)
Admission: RE | Admit: 2018-07-03 | Discharge: 2018-07-03 | Disposition: A | Discharge status: Home / Self Care | Payer: Medicare Other | Source: Ambulatory Visit | Attending: Rheumatology | Admitting: Rheumatology

## 2018-07-03 DIAGNOSIS — M0609 Rheumatoid arthritis without rheumatoid factor, multiple sites: Secondary | ICD-10-CM | POA: Diagnosis not present

## 2018-07-03 MED ORDER — DIPHENHYDRAMINE HCL 25 MG PO CAPS: 25.0000 mg | ORAL_CAPSULE | ORAL | Status: DC

## 2018-07-03 MED ORDER — ACETAMINOPHEN 325 MG PO TABS: 650.0000 mg | ORAL_TABLET | ORAL | Status: DC

## 2018-07-03 MED ORDER — SODIUM CHLORIDE 0.9 % IV SOLN
750.0000 mg | INTRAVENOUS | Status: DC
  Administered 2018-07-03: 750 mg via INTRAVENOUS
  Filled 2018-07-03: qty 30

## 2018-07-06 ENCOUNTER — Other Ambulatory Visit: Payer: Self-pay | Admitting: Internal Medicine

## 2018-07-16 ENCOUNTER — Other Ambulatory Visit: Payer: Self-pay | Admitting: *Deleted

## 2018-07-16 DIAGNOSIS — M0609 Rheumatoid arthritis without rheumatoid factor, multiple sites: Secondary | ICD-10-CM

## 2018-07-27 ENCOUNTER — Other Ambulatory Visit: Payer: Self-pay | Admitting: Pulmonary Disease

## 2018-07-27 ENCOUNTER — Telehealth: Payer: Self-pay | Admitting: Pulmonary Disease

## 2018-07-27 MED ORDER — AMOXICILLIN-POT CLAVULANATE 875-125 MG PO TABS: 1.0000 | ORAL_TABLET | Freq: Two times a day (BID) | ORAL | 0 refills | Status: DC

## 2018-07-27 NOTE — Telephone Encounter (Signed)
Call patient back following a call to answering service  In terms of upper respiratory infection  Electronically sent in Augmentin 875 p.o. twice daily for 7 days to [facility] in [place]

## 2018-07-28 ENCOUNTER — Telehealth: Payer: Self-pay | Admitting: Rheumatology

## 2018-07-28 NOTE — Telephone Encounter (Signed)
Patient advised that he may reschedule his infusion once he has finished antibiotics and is well. Patient verbalized understanding.

## 2018-07-28 NOTE — Telephone Encounter (Signed)
Patient was scheduled for an infusion on 07/31/18. Patient has come down with an upper respiratory infection, and now on antibiotics. He will finish those on 08/02/18. When can patient reschedule his infusion to. Please call to advise.

## 2018-07-31 ENCOUNTER — Encounter (HOSPITAL_COMMUNITY): Payer: Medicare Other

## 2018-08-12 ENCOUNTER — Ambulatory Visit (HOSPITAL_COMMUNITY)
Admission: RE | Admit: 2018-08-12 | Discharge: 2018-08-12 | Disposition: A | Discharge status: Home / Self Care | Payer: Medicare Other | Source: Ambulatory Visit | Attending: Rheumatology | Admitting: Rheumatology

## 2018-08-12 DIAGNOSIS — M0609 Rheumatoid arthritis without rheumatoid factor, multiple sites: Secondary | ICD-10-CM | POA: Diagnosis not present

## 2018-08-12 LAB — COMPREHENSIVE METABOLIC PANEL WITH GFR
ALT: 26 U/L (ref 0–44)
AST: 30 U/L (ref 15–41)
Albumin: 3.8 g/dL (ref 3.5–5.0)
Alkaline Phosphatase: 46 U/L (ref 38–126)
Anion gap: 7 (ref 5–15)
BUN: 14 mg/dL (ref 8–23)
CO2: 23 mmol/L (ref 22–32)
Calcium: 9.2 mg/dL (ref 8.9–10.3)
Chloride: 110 mmol/L (ref 98–111)
Creatinine, Ser: 1.23 mg/dL (ref 0.61–1.24)
GFR calc Af Amer: >60 mL/min
GFR calc non Af Amer: 60 mL/min — ABNORMAL LOW
Glucose, Bld: 100 mg/dL — ABNORMAL HIGH (ref 70–99)
Potassium: 3.6 mmol/L (ref 3.5–5.1)
Sodium: 140 mmol/L (ref 135–145)
Total Bilirubin: 0.8 mg/dL (ref 0.3–1.2)
Total Protein: 6.9 g/dL (ref 6.5–8.1)

## 2018-08-12 LAB — CBC
HCT: 41.7 % (ref 39.0–52.0)
Hemoglobin: 13.1 g/dL (ref 13.0–17.0)
MCH: 30.5 pg (ref 26.0–34.0)
MCHC: 31.4 g/dL (ref 30.0–36.0)
MCV: 97.2 fL (ref 80.0–100.0)
Platelets: 291 10*3/uL (ref 150–400)
RBC: 4.29 MIL/uL (ref 4.22–5.81)
RDW: 12.9 % (ref 11.5–15.5)
WBC: 4.7 10*3/uL (ref 4.0–10.5)
nRBC: 0 % (ref 0.0–0.2)

## 2018-08-12 MED ORDER — SODIUM CHLORIDE 0.9 % IV SOLN
750.0000 mg | INTRAVENOUS | Status: DC
  Administered 2018-08-12: 750 mg via INTRAVENOUS
  Filled 2018-08-12: qty 30

## 2018-08-12 MED ORDER — DIPHENHYDRAMINE HCL 25 MG PO CAPS: 25.0000 mg | ORAL_CAPSULE | ORAL | Status: DC

## 2018-08-12 MED ORDER — ACETAMINOPHEN 325 MG PO TABS: 650.0000 mg | ORAL_TABLET | ORAL | Status: DC

## 2018-08-14 LAB — QUANTIFERON-TB GOLD PLUS (RQFGPL)
QuantiFERON Mitogen Value: 9.2 IU/mL
QuantiFERON Nil Value: 0.03 IU/mL
QuantiFERON TB1 Ag Value: 0.02 IU/mL
QuantiFERON TB2 Ag Value: 0.02 IU/mL

## 2018-08-14 LAB — QUANTIFERON-TB GOLD PLUS: QUANTIFERON-TB GOLD PLUS: NEGATIVE

## 2018-08-20 ENCOUNTER — Other Ambulatory Visit: Payer: Self-pay | Admitting: Internal Medicine

## 2018-08-26 DIAGNOSIS — Z79899 Other long term (current) drug therapy: Secondary | ICD-10-CM | POA: Diagnosis not present

## 2018-08-28 ENCOUNTER — Other Ambulatory Visit: Payer: Self-pay | Admitting: *Deleted

## 2018-08-28 NOTE — Progress Notes (Signed)
Infusion orders are current for patient CBC CMP Tylenol Benadryl appointments are up to date and follow up appointment  is scheduled TB gold not due yet.  

## 2018-09-09 ENCOUNTER — Ambulatory Visit (HOSPITAL_COMMUNITY)
Admission: RE | Admit: 2018-09-09 | Discharge: 2018-09-09 | Disposition: A | Discharge status: Home / Self Care | Payer: Medicare Other | Source: Ambulatory Visit | Attending: Rheumatology | Admitting: Rheumatology

## 2018-09-09 DIAGNOSIS — M0609 Rheumatoid arthritis without rheumatoid factor, multiple sites: Secondary | ICD-10-CM

## 2018-09-09 MED ORDER — DIPHENHYDRAMINE HCL 25 MG PO CAPS
ORAL_CAPSULE | ORAL | Status: AC
  Filled 2018-09-09: qty 1

## 2018-09-09 MED ORDER — ACETAMINOPHEN 325 MG PO TABS
650.0000 mg | ORAL_TABLET | ORAL | Status: DC
  Administered 2018-09-09: 650 mg via ORAL

## 2018-09-09 MED ORDER — DIPHENHYDRAMINE HCL 25 MG PO CAPS
25.0000 mg | ORAL_CAPSULE | ORAL | Status: DC
  Administered 2018-09-09: 25 mg via ORAL

## 2018-09-09 MED ORDER — SODIUM CHLORIDE 0.9 % IV SOLN
750.0000 mg | INTRAVENOUS | Status: DC
  Administered 2018-09-09: 750 mg via INTRAVENOUS
  Filled 2018-09-09: qty 30

## 2018-09-09 MED ORDER — ACETAMINOPHEN 325 MG PO TABS
ORAL_TABLET | ORAL | Status: AC
  Filled 2018-09-09: qty 2

## 2018-09-22 ENCOUNTER — Telehealth: Payer: Self-pay | Admitting: Pharmacy Technician

## 2018-09-22 DIAGNOSIS — E230 Hypopituitarism: Secondary | ICD-10-CM | POA: Diagnosis not present

## 2018-09-22 DIAGNOSIS — R5382 Chronic fatigue, unspecified: Secondary | ICD-10-CM | POA: Diagnosis not present

## 2018-09-22 NOTE — Telephone Encounter (Signed)
Left message for patient to see if she is expecting any insurance changes for 2020. Patient is receiving infusions that may require a pre-certification.  11:31 AM Beatriz Chancellor, CPhT

## 2018-09-22 NOTE — Telephone Encounter (Signed)
Called the patient to verify benefits for 2020. Pt currently received infusions that may require a pre-certification. Patient states that his insurance plans will not change for 2020. Medicare covers 80% of the infusion and no authorization is required, and the supplement would cover the 20% of the cost that was not paid for by Medicare as long as Medicare covered the medication.    1:11 PM Beatriz Chancellor, CPhT

## 2018-09-25 DIAGNOSIS — D485 Neoplasm of uncertain behavior of skin: Secondary | ICD-10-CM | POA: Diagnosis not present

## 2018-09-25 DIAGNOSIS — L409 Psoriasis, unspecified: Secondary | ICD-10-CM | POA: Diagnosis not present

## 2018-09-30 ENCOUNTER — Other Ambulatory Visit: Payer: Self-pay | Admitting: Internal Medicine

## 2018-10-02 ENCOUNTER — Other Ambulatory Visit: Payer: Self-pay | Admitting: *Deleted

## 2018-10-02 NOTE — Progress Notes (Signed)
Infusion orders are current for patient CBC CMP Tylenol Benadryl appointments are up to date and follow up appointment  is scheduled TB gold not due yet.  

## 2018-10-07 ENCOUNTER — Ambulatory Visit (HOSPITAL_COMMUNITY)
Admission: RE | Admit: 2018-10-07 | Discharge: 2018-10-07 | Disposition: A | Discharge status: Home / Self Care | Payer: Medicare Other | Source: Ambulatory Visit | Attending: Rheumatology | Admitting: Rheumatology

## 2018-10-07 DIAGNOSIS — M0609 Rheumatoid arthritis without rheumatoid factor, multiple sites: Secondary | ICD-10-CM | POA: Insufficient documentation

## 2018-10-07 MED ORDER — ACETAMINOPHEN 325 MG PO TABS: 650.0000 mg | ORAL_TABLET | ORAL | Status: DC

## 2018-10-07 MED ORDER — SODIUM CHLORIDE 0.9 % IV SOLN
750.0000 mg | INTRAVENOUS | Status: DC
  Administered 2018-10-07: 750 mg via INTRAVENOUS
  Filled 2018-10-07: qty 30

## 2018-10-07 MED ORDER — DIPHENHYDRAMINE HCL 25 MG PO CAPS: 25.0000 mg | ORAL_CAPSULE | ORAL | Status: DC

## 2018-10-17 NOTE — Progress Notes (Signed)
Office Visit Note  Patient: [name] [name]             Date of Birth: 1951/05/28           MRN: [id number]             PCP: Wenda Low, MD Referring: Wenda Low, MD Visit Date: 10/30/2018 Occupation: @GUAROCC @  Subjective:  Medication monitoring    History of Present Illness: [name] [name] is a 68 y.o. male with history of seronegative rheumatoid arthriitis and osteoarthritis.  He is receiving Orencia IV infusions every month.  His last infusion was on 10/07/18 and his next infusion is scheduled for 11/04/18.  He reports he may need to postpone this infusion due to needing to schedule a tooth extraction.  He reports he has root canal that is eroding his jaw and he will require oral surgery.   He denies any recent rheumatoid arthritis flares.  He denies any joint pain or joint swelling at this time.  He has morning stiffness lasting about 30 minutes.  He has noticed decreased grip strength and fine motor movements. He has occasional discomfort in the left shoulder joint at night if he is laying on his left side.  He continues to go to the gym every other day.  He has been working on strengthening exercises.  He denies any recent infections.  He received the annual influenza vaccination.     Activities of Daily Living:  Patient reports morning stiffness for 30 minutes.   Patient Reports nocturnal pain.  Difficulty dressing/grooming: Denies Difficulty climbing stairs: Denies Difficulty getting out of chair: Reports Difficulty using hands for taps, buttons, cutlery, and/or writing: Reports  Review of Systems  Constitutional: Positive for fatigue. Negative for night sweats.  HENT: Negative for mouth sores, mouth dryness and nose dryness.   Eyes: Negative for redness, visual disturbance and dryness.  Respiratory: Negative for cough, hemoptysis, shortness of breath and difficulty breathing.   Cardiovascular: Negative for chest pain, palpitations, hypertension, irregular  heartbeat and swelling in legs/feet.  Gastrointestinal: Negative for blood in stool, constipation and diarrhea.  Endocrine: Negative for increased urination.  Genitourinary: Negative for painful urination.  Musculoskeletal: Positive for morning stiffness. Negative for arthralgias, joint pain, joint swelling, myalgias, muscle weakness, muscle tenderness and myalgias.  Skin: Positive for rash (Psoriasis). Negative for color change, hair loss, nodules/bumps, skin tightness, ulcers and sensitivity to sunlight.  Allergic/Immunologic: Negative for susceptible to infections.  Neurological: Positive for headaches. Negative for dizziness, fainting, memory loss, night sweats and weakness.  Hematological: Negative for swollen glands.  Psychiatric/Behavioral: Negative for depressed mood and sleep disturbance. The patient is not nervous/anxious.     PMFS History:  Patient Active Problem List   Diagnosis Date Noted  . History of gastroesophageal reflux (GERD) 05/29/2018  . High risk medication use 05/29/2018  . History of IBS 05/29/2018  . History of depression 07/15/2017  . History of asthma 07/08/2017  . 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 Asthmatic bronchitis, mild intermittent, uncomplicated 65/46/5035  . Asthmatic bronchitis with acute exacerbation 10/15/2016  . Herpes zoster 08/01/2013  . Rheumatoid arthritis of multiple sites without rheumatoid factor ([place]) 09/18/2010  . NASAL POLYP 12/24/2007  . Sinusitis, chronic 12/24/2007  . Seasonal and perennial allergic rhinitis 12/24/2007  . Allergic-infective asthma 12/24/2007  . G E R D 12/24/2007    Past Medical History:  Diagnosis Date  . ALLERGIC RHINITIS   . Asthma   . GERD (gastroesophageal reflux disease)   .  Rheumatoid arthritis(714.0)     Family History  Problem Relation Age of Onset  . Multiple sclerosis Father   . Breast cancer Mother   . Breast cancer Sister    Past Surgical History:  Procedure  Laterality Date  . extensive sinus surgery with ablation of the frontal sinuses    . HERNIA REPAIR    . nasal polypectomies     Social History   Social History Narrative  . Not on file   Immunization History  Administered Date(s) Administered  . Influenza Split 07/08/2012, 07/06/2013, 07/08/2016  . Influenza, High Dose Seasonal PF 06/13/2018  . Influenza,inj,Quad PF,6+ Mos 07/15/2014, 06/19/2017  . Pneumococcal Polysaccharide-23 07/15/2014  . Zoster Recombinat (Shingrix) 05/23/2018, 08/21/2018     Objective: Vital Signs: BP 133/81 (BP Location: Left Arm, Patient Position: Sitting, Cuff Size: Normal)   Pulse 66   Resp 16   Ht 5\' 8"  (1.727 m)   Wt 190 lb 6.4 oz (86.4 kg)   BMI 28.95 kg/m    Physical Exam Vitals signs and nursing note reviewed.  Constitutional:      Appearance: He is well-developed.  HENT:     Head: Normocephalic and atraumatic.  Eyes:     Conjunctiva/sclera: Conjunctivae normal.     Pupils: Pupils are equal, round, and reactive to light.  Neck:     Musculoskeletal: Normal range of motion and neck supple.  Cardiovascular:     Rate and Rhythm: Normal rate and regular rhythm.     Heart sounds: Normal heart sounds.  Pulmonary:     Effort: Pulmonary effort is normal.     Breath sounds: Normal breath sounds.  Abdominal:     General: Bowel sounds are normal.     Palpations: Abdomen is soft.  Lymphadenopathy:     Cervical: No cervical adenopathy.  Skin:    General: Skin is warm and dry.     Capillary Refill: Capillary refill takes less than 2 seconds.  Neurological:     Mental Status: He is alert and oriented to person, place, and time.  Psychiatric:        Behavior: Behavior normal.      Musculoskeletal Exam: C-spine limited range of motion without rotation.  Thoracic and lumbar spine good range of motion.  No midline spinal tenderness.  No SI joint tenderness.  Shoulder joints good range of motion with no discomfort.  Elbow joints good range of  motion with no tenderness or swelling.  He has limited range of motion of bilateral wrist joints.  He is synovial thickening of all MCP joints but no active synovitis noted.  He has PIP and DIP synovial thickening consistent with osteoarthritis of bilateral hands.  Hip joints, knee joints, ankle joints, MTPs, PIPs, DIPs good range of motion with no synovitis.  No warmth or effusion bilateral knee joints.  No tenderness or swelling of ankle joints.  He has PIP and DIP synovial thickening consistent with osteoarthritis of bilateral feet.  There is no tenderness over trochanter bursa bilaterally.  CDAI Exam: CDAI Score: 0  Patient Global Assessment: 0 (mm); Provider Global Assessment: 0 (mm) Swollen: 0 ; Tender: 0  Joint Exam   Not documented   There is currently no information documented on the homunculus. Go to the Rheumatology activity and complete the homunculus joint exam.  Investigation: No additional findings.  Imaging: No results found.  Recent Labs: Lab Results  Component Value Date   WBC 4.7 08/12/2018   HGB 13.1 08/12/2018   PLT 291  08/12/2018   NA 140 08/12/2018   K 3.6 08/12/2018   CL 110 08/12/2018   CO2 23 08/12/2018   GLUCOSE 100 (H) 08/12/2018   BUN 14 08/12/2018   CREATININE 1.23 08/12/2018   BILITOT 0.8 08/12/2018   ALKPHOS 46 08/12/2018   AST 30 08/12/2018   ALT 26 08/12/2018   PROT 6.9 08/12/2018   ALBUMIN 3.8 08/12/2018   CALCIUM 9.2 08/12/2018   GFRAA >60 08/12/2018   QFTBGOLD NEGATIVE 07/16/2017   QFTBGOLDPLUS Negative 08/12/2018    Speciality Comments: No specialty comments available.  Procedures:  No procedures performed Allergies: Simponi [golimumab]; Aspirin; Other; Sulfonamide derivatives; and Theophyllines   Assessment / Plan:     Visit Diagnoses: Rheumatoid arthritis of multiple sites without rheumatoid factor ([place]): He has no active synovitis on exam.  He has synovial thickening of all MCP joints. He has limited ROM of both wrist joints.   He has noticed decreased grip strength and fine motor movements.  A referral to PT for joint protection and muscle strengthening of both hands will be placed today. He has not had any recent flares. He is clinically doing well on Orencia IV infusions every month.  His last infusion was on 10/07/18 and his next infusion is scheduled for 11/04/18.  He is planning on postponing his next infusion to schedule oral surgery.  We will fill out the necessary surgical clearance once we receive the documents.  He will need to be cleared by his oral surgeon before resuming Orencia infusions.  He was advised to notify us if he develops increased joint pain or joint swelling.  He will follow-up in the office in 5 months  High risk medication use -  Orencia IV monthly last infusion on 10/07/2018.  Last TB gold negative on 08/12/2018.  Most recent CBC/CMP within normal limits on 08/12/2018 and will monitor with infusions.  Patient previously received Pneumovax 23 and Shingrix vaccine.  He also received the annual influenza vaccination.   Primary osteoarthritis of both hands: He has PIP and DIP synovial thickening cyst with osteoarthritis of bilateral hands.  He has no synovitis on exam.  Joint protection and muscle strengthening were discussed.  Patient is concerned about the decreased grip strength in his hands.  We will refer him to hand rehab center for physical therapy and Occupational Therapy.  Coarse tremors: He feels as though the tremors have been worsening.  He has been having difficulty with grip strength and fine motor movements.  Other medical conditions are listed as follows:  History of gastroesophageal reflux (GERD)  History of IBS  History of asthma  History of depression  Family history of MS (multiple sclerosis)   Orders: Orders Placed This Encounter  Procedures  . Ambulatory referral to Physical Therapy   No orders of the defined types were placed in this encounter.     Follow-Up  Instructions: Return in about 5 months (around 03/31/2019) for Rheumatoid arthritis, Osteoarthritis.   Hazel Sams PA-C  I examined and evaluated the patient with Hazel Sams PA.  Patient had no synovitis on examination today.  He is clinically doing very well on IV Orencia.  He will need some dental work.  I detailed discussion with patient.  He will have to come off Orencia for a month prior to surgery and that he can resume a week or 2 weeks later if he gets clearance from his oral surgeon.  Patient's last Orencia infusion was about a month ago.  He can hold off  Orencia infusion until surgery is performed.  The plan of care was discussed as noted above.  Bo Merino, MD  Note - This record has been created using Editor, commissioning.  Chart creation errors have been sought, but may not always  have been located. Such creation errors do not reflect on  the standard of medical care.

## 2018-10-29 ENCOUNTER — Ambulatory Visit (INDEPENDENT_AMBULATORY_CARE_PROVIDER_SITE_OTHER): Payer: Medicare Other | Admitting: Internal Medicine

## 2018-10-29 ENCOUNTER — Encounter: Payer: Self-pay | Admitting: Internal Medicine

## 2018-10-29 DIAGNOSIS — J4520 Mild intermittent asthma, uncomplicated: Secondary | ICD-10-CM

## 2018-10-29 DIAGNOSIS — K219 Gastro-esophageal reflux disease without esophagitis: Secondary | ICD-10-CM | POA: Diagnosis not present

## 2018-10-29 MED ORDER — ALBUTEROL SULFATE HFA 108 (90 BASE) MCG/ACT IN AERS: INHALATION_SPRAY | RESPIRATORY_TRACT | 12 refills | Status: DC

## 2018-10-29 MED ORDER — FLUTICASONE-UMECLIDIN-VILANT 100-62.5-25 MCG/INH IN AEPB: 1.0000 | INHALATION_SPRAY | Freq: Every day | RESPIRATORY_TRACT | 0 refills | Status: DC

## 2018-10-29 MED ORDER — MONTELUKAST SODIUM 10 MG PO TABS: 10.0000 mg | ORAL_TABLET | Freq: Every day | ORAL | 3 refills | Status: DC

## 2018-10-29 NOTE — Assessment & Plan Note (Signed)
Elevated head of bed and is using omeprazole regularly.  These have controlled reflux symptoms and help to reduce airway irritation.

## 2018-10-29 NOTE — Progress Notes (Signed)
HPI M former smoker followed for asthma, allergic rhinitis, hx rhinosinusitis, nasal polyps complicated by rheumatoid arthritis. FENO- 08/07/16- 25 Office spirometry 08/07/16- WNL, FEV1 3.59/ 110%, R 0.81 PFT 11/01/16- WNL-FVC 5.30/123%, FEV1 4.0/124%, ratio 0.75, FEF 25-75% 3.49/135%, TLC 131%, DLCO 99% ---------------------------------------------------------------  04/28/2018- 68 year old male former smoker followed for asthma, allergic rhinitis, history rhinosinusitis, nasal polyps, complicated by rheumatoid arthritis/MTX/Humira, bipolar, GERD Trelegy approved by PA after failing breo, anoro, Qvar. Was going to f/u w Dr Erik Obey ENT for sinutitis after augmentin. Lab 5/9- Resp Allergy Profile IgE 63, no elevations. EOS 1.1% -----Asthma: Pt states he has been doing well overall and feels that Trelegy is working great.  Trelegy, singulair, Proair hfa,  Has felt very much better with Trelegy and resumption of his antireflux measures.  Head of bed is now up on blocks and he is currently using omeprazole twice daily.  Sinusitis finally responded to extended Augmentin.  10/29/2018- 68 year old male former smoker followed for asthma, allergic rhinitis, history rhinosinusitis, nasal polyps, complicated by rheumatoid arthritis/MTX/Humira, bipolar, GERD Pro-air HFA, Trelegy, Singulair,.   He is very pleased with asthma control now, crediting elevation of head of bed to reduce reflux, regular use of omeprazole, and Trelegy.  No acute episodes since last here. Tried using rescue inhaler 1 puff twice daily and felt it was not enough, so went back to 2 puffs twice daily.  Breathing does not wake him at night. He has moved to a condo and no longer has to do yard work.  ROS-see HPI    + = positive Constitutional:   No-   weight loss, night sweats, fevers, chills, fatigue, lassitude. HEENT:   No-  headaches, difficulty swallowing, tooth/dental problems, sore throat,       No-  sneezing, itching, ear ache,  nasal congestion, post nasal drip,  CV:  No-   chest pain, orthopnea, PND, swelling in lower extremities, anasarca, dizziness, palpitations Resp: No-   shortness of breath with exertion or at rest.                productive cough,   non-productive cough,   coughing up of blood.                 change in color of mucus.    wheezing.   Skin: No rash GI:  heartburn, indigestion, abdominal pain, nausea, vomiting, GU:  MS: +  joint pain or swelling.   Neuro-     nothing unusual Psych:  No- change in mood or affect. No depression or anxiety.  No memory loss.  OBJ General- Alert, Oriented, Affect-appropriate, Distress- none acute,  Skin-  Lymphadenopathy- none Head- atraumatic            Eyes- Gross vision intact, PERRLA, conjunctivae clear secretions            Ears- Hearing, canals-normal            Nose-  no-Septal dev, mucus, polyps+ bilateral, erosion, perforation             Throat- Mallampati III , mucosa clear , drainage- none, tonsils- atrophic, Neck- flexible , trachea midline, no stridor , thyroid nl, carotid no bruit Chest - symmetrical excursion , unlabored           Heart/CV- RRR , no murmur , no gallop  , no rub, nl s1 s2                           -  JVD- none , edema- none, stasis changes- none, varices- none           Lung- + clear, but breath sounds are a little coarse with laughter, wheeze- none, cough -none                               dullness-none, rub- none           Chest wall-  Abd-  Br/ Gen/ Rectal- Not done, not indicated Extrem- cyanosis- none, clubbing, none, atrophy- none, strength- nl.                          +Synovial thickening of hand joints. Neuro- grossly intact to observation

## 2018-10-29 NOTE — Patient Instructions (Signed)
Refills sent for Singulair and ProAir hfa. Call for refills as needed.  Please call if we can help

## 2018-10-29 NOTE — Assessment & Plan Note (Signed)
Good maintenance control now without exacerbations.  Treatment emphasizes control of reflux and  Trelegy. Continue current meds.  Refill sent

## 2018-10-30 ENCOUNTER — Encounter: Payer: Self-pay | Admitting: Physician Assistant

## 2018-10-30 ENCOUNTER — Ambulatory Visit (INDEPENDENT_AMBULATORY_CARE_PROVIDER_SITE_OTHER): Payer: Medicare Other | Admitting: Rheumatology

## 2018-10-30 VITALS — BP 133/81 | HR 66 | Resp 16 | Ht 68.0 in | Wt 190.4 lb

## 2018-10-30 DIAGNOSIS — Z8719 Personal history of other diseases of the digestive system: Secondary | ICD-10-CM

## 2018-10-30 DIAGNOSIS — Z8709 Personal history of other diseases of the respiratory system: Secondary | ICD-10-CM | POA: Diagnosis not present

## 2018-10-30 DIAGNOSIS — M19041 Primary osteoarthritis, right hand: Secondary | ICD-10-CM

## 2018-10-30 DIAGNOSIS — Z8659 Personal history of other mental and behavioral disorders: Secondary | ICD-10-CM

## 2018-10-30 DIAGNOSIS — M0609 Rheumatoid arthritis without rheumatoid factor, multiple sites: Secondary | ICD-10-CM

## 2018-10-30 DIAGNOSIS — Z79899 Other long term (current) drug therapy: Secondary | ICD-10-CM | POA: Diagnosis not present

## 2018-10-30 DIAGNOSIS — R29898 Other symptoms and signs involving the musculoskeletal system: Secondary | ICD-10-CM

## 2018-10-30 DIAGNOSIS — G252 Other specified forms of tremor: Secondary | ICD-10-CM

## 2018-10-30 DIAGNOSIS — Z820 Family history of epilepsy and other diseases of the nervous system: Secondary | ICD-10-CM | POA: Diagnosis not present

## 2018-10-30 DIAGNOSIS — M19042 Primary osteoarthritis, left hand: Secondary | ICD-10-CM

## 2018-10-30 NOTE — Patient Instructions (Signed)
Standing Labs We placed an order today for your standing lab work.    Please come back and get your standing labs in February and every 3 months   We have open lab Monday through Friday from 8:30-11:30 AM and 1:30-4:00 PM  at the office of Dr. Shaili Deveshwar.   You may experience shorter wait times on Monday and Friday afternoons. The office is located at [address], [address], [place], [place] [address] No appointment is necessary.   Labs are drawn by [facility].  You may receive a bill from [facility] for your lab work.  If you wish to have your labs drawn at another location, please call the office 24 hours in advance to send orders.  If you have any questions regarding directions or hours of operation,  please call [phone].   Just as a reminder please drink plenty of water prior to coming for your lab work. Thanks!   

## 2018-10-31 ENCOUNTER — Other Ambulatory Visit: Payer: Self-pay | Admitting: *Deleted

## 2018-10-31 NOTE — Progress Notes (Signed)
Infusion orders are current for patient CBC CMP Tylenol Benadryl appointments are up to date and follow up appointment  is scheduled TB gold not due yet.  

## 2018-11-04 ENCOUNTER — Encounter (HOSPITAL_COMMUNITY): Payer: Medicare Other

## 2018-11-06 DIAGNOSIS — L814 Other melanin hyperpigmentation: Secondary | ICD-10-CM | POA: Diagnosis not present

## 2018-11-06 DIAGNOSIS — D1801 Hemangioma of skin and subcutaneous tissue: Secondary | ICD-10-CM | POA: Diagnosis not present

## 2018-11-06 DIAGNOSIS — D225 Melanocytic nevi of trunk: Secondary | ICD-10-CM | POA: Diagnosis not present

## 2018-11-06 DIAGNOSIS — L308 Other specified dermatitis: Secondary | ICD-10-CM | POA: Diagnosis not present

## 2018-11-06 DIAGNOSIS — L821 Other seborrheic keratosis: Secondary | ICD-10-CM | POA: Diagnosis not present

## 2018-11-07 ENCOUNTER — Telehealth: Payer: Self-pay | Admitting: Internal Medicine

## 2018-11-07 MED ORDER — ALBUTEROL SULFATE HFA 108 (90 BASE) MCG/ACT IN AERS: 1.0000 | INHALATION_SPRAY | Freq: Four times a day (QID) | RESPIRATORY_TRACT | 5 refills | Status: DC | PRN

## 2018-11-07 NOTE — Telephone Encounter (Signed)
Call made to patient, he states his inhaler Pro-air that was sent in is not covered. Informed patient he will need to call his insurance and find out what inhalers are no his formulary. Voiced understanding. Will close this message until patient is able to call back.

## 2018-11-07 NOTE — Telephone Encounter (Signed)
Call made to patient, confirmed that we need to send in as Pro-Air. Confirmed pharmacy. Refill sent. Nothing further is needed at this time.

## 2018-11-20 ENCOUNTER — Encounter (HOSPITAL_COMMUNITY): Payer: Medicare Other

## 2018-11-21 ENCOUNTER — Ambulatory Visit (HOSPITAL_COMMUNITY)
Admission: RE | Admit: 2018-11-21 | Discharge: 2018-11-21 | Disposition: A | Discharge status: Home / Self Care | Payer: Medicare Other | Source: Ambulatory Visit | Attending: Rheumatology | Admitting: Rheumatology

## 2018-11-21 DIAGNOSIS — M0609 Rheumatoid arthritis without rheumatoid factor, multiple sites: Secondary | ICD-10-CM | POA: Diagnosis not present

## 2018-11-21 LAB — COMPREHENSIVE METABOLIC PANEL
ALT: 22 U/L (ref 0–44)
AST: 21 U/L (ref 15–41)
Albumin: 3.7 g/dL (ref 3.5–5.0)
Alkaline Phosphatase: 49 U/L (ref 38–126)
Anion gap: 8 (ref 5–15)
BUN: 14 mg/dL (ref 8–23)
CO2: 21 mmol/L — ABNORMAL LOW (ref 22–32)
Calcium: 9.5 mg/dL (ref 8.9–10.3)
Chloride: 109 mmol/L (ref 98–111)
Creatinine, Ser: 1.24 mg/dL (ref 0.61–1.24)
GFR calc Af Amer: >60 mL/min (ref >60)
GFR calc non Af Amer: >60 mL/min (ref >60)
Glucose, Bld: 126 mg/dL — ABNORMAL HIGH (ref 70–99)
Potassium: 3.8 mmol/L (ref 3.5–5.1)
Sodium: 138 mmol/L (ref 135–145)
TOTAL PROTEIN: 6.8 g/dL (ref 6.5–8.1)
Total Bilirubin: 0.7 mg/dL (ref 0.3–1.2)

## 2018-11-21 LAB — CBC
HCT: 40.9 % (ref 39.0–52.0)
Hemoglobin: 13.4 g/dL (ref 13.0–17.0)
MCH: 31.3 pg (ref 26.0–34.0)
MCHC: 32.8 g/dL (ref 30.0–36.0)
MCV: 95.6 fL (ref 80.0–100.0)
Platelets: 349 10*3/uL (ref 150–400)
RBC: 4.28 MIL/uL (ref 4.22–5.81)
RDW: 12.8 % (ref 11.5–15.5)
WBC: 7.1 10*3/uL (ref 4.0–10.5)
nRBC: 0 % (ref 0.0–0.2)

## 2018-11-21 MED ORDER — ACETAMINOPHEN 325 MG PO TABS: 650.0000 mg | ORAL_TABLET | ORAL | Status: DC

## 2018-11-21 MED ORDER — SODIUM CHLORIDE 0.9 % IV SOLN
750.0000 mg | INTRAVENOUS | Status: DC
  Administered 2018-11-21: 750 mg via INTRAVENOUS
  Filled 2018-11-21: qty 30

## 2018-11-21 MED ORDER — DIPHENHYDRAMINE HCL 25 MG PO CAPS: 25.0000 mg | ORAL_CAPSULE | ORAL | Status: DC

## 2018-11-26 DIAGNOSIS — R251 Tremor, unspecified: Secondary | ICD-10-CM | POA: Diagnosis not present

## 2018-11-26 DIAGNOSIS — E230 Hypopituitarism: Secondary | ICD-10-CM | POA: Diagnosis not present

## 2018-11-26 DIAGNOSIS — I10 Essential (primary) hypertension: Secondary | ICD-10-CM | POA: Diagnosis not present

## 2018-11-26 DIAGNOSIS — F39 Unspecified mood [affective] disorder: Secondary | ICD-10-CM | POA: Diagnosis not present

## 2018-11-26 DIAGNOSIS — D692 Other nonthrombocytopenic purpura: Secondary | ICD-10-CM | POA: Diagnosis not present

## 2018-11-26 DIAGNOSIS — R7309 Other abnormal glucose: Secondary | ICD-10-CM | POA: Diagnosis not present

## 2018-11-26 DIAGNOSIS — Z1211 Encounter for screening for malignant neoplasm of colon: Secondary | ICD-10-CM | POA: Diagnosis not present

## 2018-11-26 DIAGNOSIS — K219 Gastro-esophageal reflux disease without esophagitis: Secondary | ICD-10-CM | POA: Diagnosis not present

## 2018-11-26 DIAGNOSIS — M069 Rheumatoid arthritis, unspecified: Secondary | ICD-10-CM | POA: Diagnosis not present

## 2018-11-26 DIAGNOSIS — F319 Bipolar disorder, unspecified: Secondary | ICD-10-CM | POA: Diagnosis not present

## 2018-11-26 DIAGNOSIS — J4520 Mild intermittent asthma, uncomplicated: Secondary | ICD-10-CM | POA: Diagnosis not present

## 2018-12-01 ENCOUNTER — Other Ambulatory Visit: Payer: Self-pay | Admitting: *Deleted

## 2018-12-01 DIAGNOSIS — M19041 Primary osteoarthritis, right hand: Secondary | ICD-10-CM | POA: Diagnosis not present

## 2018-12-01 DIAGNOSIS — M25631 Stiffness of right wrist, not elsewhere classified: Secondary | ICD-10-CM | POA: Diagnosis not present

## 2018-12-01 DIAGNOSIS — M25632 Stiffness of left wrist, not elsewhere classified: Secondary | ICD-10-CM | POA: Diagnosis not present

## 2018-12-01 DIAGNOSIS — M79641 Pain in right hand: Secondary | ICD-10-CM | POA: Diagnosis not present

## 2018-12-01 DIAGNOSIS — M25642 Stiffness of left hand, not elsewhere classified: Secondary | ICD-10-CM | POA: Diagnosis not present

## 2018-12-01 DIAGNOSIS — M0609 Rheumatoid arthritis without rheumatoid factor, multiple sites: Secondary | ICD-10-CM

## 2018-12-01 DIAGNOSIS — M79642 Pain in left hand: Secondary | ICD-10-CM | POA: Diagnosis not present

## 2018-12-01 DIAGNOSIS — M25641 Stiffness of right hand, not elsewhere classified: Secondary | ICD-10-CM | POA: Diagnosis not present

## 2018-12-01 DIAGNOSIS — M19042 Primary osteoarthritis, left hand: Secondary | ICD-10-CM | POA: Diagnosis not present

## 2018-12-02 ENCOUNTER — Encounter: Payer: Self-pay | Admitting: Neurology

## 2018-12-03 DIAGNOSIS — M25631 Stiffness of right wrist, not elsewhere classified: Secondary | ICD-10-CM | POA: Diagnosis not present

## 2018-12-03 DIAGNOSIS — M25632 Stiffness of left wrist, not elsewhere classified: Secondary | ICD-10-CM | POA: Diagnosis not present

## 2018-12-03 DIAGNOSIS — M25642 Stiffness of left hand, not elsewhere classified: Secondary | ICD-10-CM | POA: Diagnosis not present

## 2018-12-03 DIAGNOSIS — M79641 Pain in right hand: Secondary | ICD-10-CM | POA: Diagnosis not present

## 2018-12-03 DIAGNOSIS — M79642 Pain in left hand: Secondary | ICD-10-CM | POA: Diagnosis not present

## 2018-12-03 DIAGNOSIS — M25641 Stiffness of right hand, not elsewhere classified: Secondary | ICD-10-CM | POA: Diagnosis not present

## 2018-12-03 DIAGNOSIS — M19042 Primary osteoarthritis, left hand: Secondary | ICD-10-CM | POA: Diagnosis not present

## 2018-12-03 DIAGNOSIS — M19041 Primary osteoarthritis, right hand: Secondary | ICD-10-CM | POA: Diagnosis not present

## 2018-12-09 NOTE — Progress Notes (Signed)
[name] [name] was seen today in the movement disorders clinic for neurologic consultation at the request of Wenda Low, MD.  The consultation is for the evaluation of tremor.  Tremor: Yes.     How long has it been going on? 3-4 years  At rest or with activation?  activation  When is it noted the most?  eating  Fam hx of tremor?  Yes.   (father had MS and had tremor with that)  Located where?  Bilateral UE (is r hand dominant)  Affected by caffeine:  No. (3 cups coffee per day)  Affected by alcohol:  No. (glass every 3 - 4 days)  Affected by stress:  Yes.   (and does state that he has been easily angered and wonders if due to testosterone and just stopped that a few days ago)  Affected by fatigue:  No.  Spills soup if on spoon:  May or may not  Spills glass of liquid if full:  Yes.   (will spill it if in the AM or night, but does better in the middle of the day)  Affects ADL's (tying shoes, brushing teeth, etc):  Yes.   (trouble with little buttons and tying shoes)  Tremor inducing meds:  Yes.  , lithium (been on that x 4-5 years), proair, singulair (been on for many years)  MRI of the brain was performed in February, 2013.  I personally reviewed this.  Intracranially, it was essentially normal.   ALLERGIES:   Allergies  Allergen Reactions  . Simponi [Golimumab]     Repeated infections  . Aspirin     REACTION: throat swelling  . Other Other (See Comments)    REACTION: oral sores  . Sulfonamide Derivatives     REACTION: oral sores  . Theophyllines     CURRENT MEDICATIONS:  Outpatient Encounter Medications as of 12/11/2018  Medication Sig  . Abatacept (ORENCIA IV) Inject into the vein every 30 (thirty) days.  Marland Kitchen albuterol (PROAIR HFA) 108 (90 Base) MCG/ACT inhaler INHALE TWO PUFFS BY MOUTH EVERY 4 HOURS  . albuterol (PROAIR HFA) 108 (90 Base) MCG/ACT inhaler Inhale 1-2 puffs into the lungs every 6 (six) hours as needed for wheezing or shortness of breath.  .  cholestyramine (QUESTRAN) 4 GM/DOSE powder   . clonazePAM (KLONOPIN) 1 MG tablet Take 1 tablet by mouth daily.  . fluticasone (FLONASE) 50 MCG/ACT nasal spray Place 1 spray into both nostrils daily.  . Fluticasone-Umeclidin-Vilant (TRELEGY ELLIPTA) 100-62.5-25 MCG/INH AEPB Inhale 1 puff into the lungs daily.  Marland Kitchen lithium carbonate 300 MG capsule Take 600 mg by mouth 2 (two) times daily with a meal.   . losartan-hydrochlorothiazide (HYZAAR) 100-25 MG tablet Take 1 tablet by mouth daily.   . montelukast (SINGULAIR) 10 MG tablet Take 1 tablet (10 mg total) by mouth daily.  . Multiple Vitamins-Minerals (MULTIVITAMIN WITH MINERALS) tablet Take by mouth.  . nystatin (MYCOSTATIN) 100000 UNIT/ML suspension Take 5 mLs by mouth daily.   . Omeprazole (PRILOSEC PO) Take by mouth 2 (two) times daily.  . Probiotic Product (PROBIOTIC PO) Take by mouth.  . sildenafil (VIAGRA) 100 MG tablet Take 100 mg by mouth daily as needed.    . Testosterone 20.25 MG/ACT (1.62%) GEL Apply 2 pumps as directed once a day   No facility-administered encounter medications on file as of 12/11/2018.     PAST MEDICAL HISTORY:   Past Medical History:  Diagnosis Date  . ALLERGIC RHINITIS   . Asthma   .  Bipolar depression ([place])   . GERD (gastroesophageal reflux disease)   . Rheumatoid arthritis(714.0)     PAST SURGICAL HISTORY:   Past Surgical History:  Procedure Laterality Date  . extensive sinus surgery with ablation of the frontal sinuses    . HERNIA REPAIR    . nasal polypectomies      SOCIAL HISTORY:   Social History   Socioeconomic History  . Marital status: Married    Spouse name: Not on file  . Number of children: Not on file  . Years of education: Not on file  . Highest education level: Not on file  Occupational History  . Not on file  Social Needs  . Financial resource strain: Not on file  . Food insecurity:    Worry: Not on file    Inability: Not on file  . Transportation needs:    Medical: Not on  file    Non-medical: Not on file  Tobacco Use  . Smoking status: Former Smoker    Packs/day: 1.00    Years: 20.00    Pack years: 20.00    Types: Cigarettes    Last attempt to quit: 10/08/1998    Years since quitting: 20.1  . Smokeless tobacco: Former Systems developer    Types: Snuff, Sarina Ser    Quit date: 1988  Substance and Sexual Activity  . Alcohol use: Yes  . Drug use: No  . Sexual activity: Not on file  Lifestyle  . Physical activity:    Days per week: Not on file    Minutes per session: Not on file  . Stress: Not on file  Relationships  . Social connections:    Talks on phone: Not on file    Gets together: Not on file    Attends religious service: Not on file    Active member of club or organization: Not on file    Attends meetings of clubs or organizations: Not on file    Relationship status: Not on file  . Intimate partner violence:    Fear of current or ex partner: Not on file    Emotionally abused: Not on file    Physically abused: Not on file    Forced sexual activity: Not on file  Other Topics Concern  . Not on file  Social History Narrative  . Not on file    FAMILY HISTORY:   Family Status  Relation Name Status  . Father  Deceased  . Mother  Deceased  . Sister  Alive  . Child 2 Alive    ROS:  Review of Systems  Constitutional: Negative.   HENT: Negative.   Eyes: Negative.   Respiratory: Negative.   Cardiovascular: Negative.   Gastrointestinal: Positive for heartburn (better with raising head of bed and with med).  Genitourinary: Negative.   Musculoskeletal: Negative.   Skin: Negative.   Endo/Heme/Allergies: Negative.     PHYSICAL EXAMINATION:    VITALS:   Vitals:   12/11/18 0854  BP: 122/62  Pulse: 62  SpO2: 95%  Weight: 189 lb (85.7 kg)  Height: 5\' 8"  (1.727 m)    GEN:  The patient appears stated age and is in NAD. HEENT:  Normocephalic, atraumatic.  The mucous membranes are moist. The superficial temporal arteries are without ropiness or  tenderness. CV:  RRR Lungs:  CTAB Neck/HEME:  There are no carotid bruits bilaterally.  Neurological examination:  Orientation: The patient is alert and oriented x3. Fund of knowledge is appropriate.  Recent and remote memory are  intact.  Attention and concentration are normal.    Able to name objects and repeat phrases. Cranial nerves: There is good facial symmetry. Pupils are equal round and reactive to light bilaterally. Fundoscopic exam reveals clear margins bilaterally. Extraocular muscles are intact. The visual fields are full to confrontational testing. The speech is fluent and clear. Soft palate rises symmetrically and there is no tongue deviation. Hearing is intact to conversational tone. Sensation: Sensation is intact to light and pinprick throughout (facial, trunk, extremities). Vibration is intact at the bilateral big toe on the right, and intact at the ankle on the left. There is no extinction with double simultaneous stimulation. There is no sensory dermatomal level identified. Motor: Strength is 5/5 in the bilateral upper and lower extremities.   Shoulder shrug is equal and symmetric.  There is no pronator drift. Deep tendon reflexes: Deep tendon reflexes are 2/4 at the bilateral biceps, triceps, brachioradialis, patella and achilles. Plantar responses are downgoing bilaterally.  Movement examination: Tone: There is normal tone in the bilateral upper extremities.  The tone in the lower extremities is normal.  Abnormal movements: there is no rest tremor.  There is mild postural tremor.  There is intention tremor.  It is not worse when given a weight.  He does spill water when asked to pour it from one glass to another, but only when the water is in the left hand. Coordination:  There is no decremation with RAM's, with any form of RAMS, including alternating supination and pronation of the forearm, hand opening and closing, finger taps, heel taps and toe taps. Gait and Station: The  patient has now difficulty arising out of a deep-seated chair without the use of the hands. The patient's stride length is normal.  He does have some trouble walking in a tandem fashion.  He is able to stand in the Romberg position.  ASSESSMENT/PLAN:  1.  Tremor  -This is likely due to the lithium.  Studies are varied, but incidate that up to 2/3 of patients on lithium will have lithium-induced tremor, even if the patients are not toxic on the medication.  This is just a common side effect of patients on lithium.  I did not recommend that he stop or taper the lithium, but rather talk to his prescribing physician about this association with tremor.  I did tell him that in my experience, lithium-induced tremor does not respond well to attempts at treating it with other medications.  He is on several other medications that could induce tremor, but I suspect that these are less likely an issue compared to lithium.  Patient asked several questions and I answered them to the best of my ability.  He will follow-up with me on an as-needed basis.  Greater than 50% of the 45-minute visit was spent in counseling with the patient.   Cc:  Wenda Low, MD

## 2018-12-11 ENCOUNTER — Encounter: Payer: Self-pay | Admitting: Neurology

## 2018-12-11 ENCOUNTER — Ambulatory Visit (INDEPENDENT_AMBULATORY_CARE_PROVIDER_SITE_OTHER): Payer: Medicare Other | Admitting: Neurology

## 2018-12-11 VITALS — BP 122/62 | HR 62 | Ht 68.0 in | Wt 189.0 lb

## 2018-12-11 DIAGNOSIS — G251 Drug-induced tremor: Secondary | ICD-10-CM

## 2018-12-11 NOTE — Progress Notes (Signed)
Note sent to [facility]. Release signed.

## 2018-12-15 ENCOUNTER — Telehealth: Payer: Self-pay | Admitting: Rheumatology

## 2018-12-15 DIAGNOSIS — E230 Hypopituitarism: Secondary | ICD-10-CM | POA: Diagnosis not present

## 2018-12-15 NOTE — Telephone Encounter (Signed)
Patient called stating he was working in the yard yesterday and fell walking up the stairs to his house.  Patient states he rolled his ankle and injured his calf and left finger.  Patient states he wears Sketchers shoes and believes the shoe is not supportive enough.  Patient is requesting a return call with recommendations on supportive shoes that he can purchase.

## 2018-12-15 NOTE — Telephone Encounter (Signed)
Patient states he fell yesterday when going up the stairs. Patient states he just has brusing and is hurting quite a bit. Patient was advised per Dr. Estanislado Pandy the best shoes to wear are tennis shoes. Patient verbalized understanding. Patient advised we can schedule and appointment with the orthopedic for evaluation of injuries/ Patient declined at this time.

## 2018-12-16 DIAGNOSIS — M19042 Primary osteoarthritis, left hand: Secondary | ICD-10-CM | POA: Diagnosis not present

## 2018-12-16 DIAGNOSIS — M25642 Stiffness of left hand, not elsewhere classified: Secondary | ICD-10-CM | POA: Diagnosis not present

## 2018-12-16 DIAGNOSIS — M79642 Pain in left hand: Secondary | ICD-10-CM | POA: Diagnosis not present

## 2018-12-16 DIAGNOSIS — M79641 Pain in right hand: Secondary | ICD-10-CM | POA: Diagnosis not present

## 2018-12-16 DIAGNOSIS — M25632 Stiffness of left wrist, not elsewhere classified: Secondary | ICD-10-CM | POA: Diagnosis not present

## 2018-12-16 DIAGNOSIS — M25641 Stiffness of right hand, not elsewhere classified: Secondary | ICD-10-CM | POA: Diagnosis not present

## 2018-12-16 DIAGNOSIS — M19041 Primary osteoarthritis, right hand: Secondary | ICD-10-CM | POA: Diagnosis not present

## 2018-12-16 DIAGNOSIS — M25631 Stiffness of right wrist, not elsewhere classified: Secondary | ICD-10-CM | POA: Diagnosis not present

## 2018-12-19 ENCOUNTER — Other Ambulatory Visit: Payer: Self-pay

## 2018-12-19 ENCOUNTER — Encounter (HOSPITAL_COMMUNITY)
Admission: RE | Admit: 2018-12-19 | Discharge: 2018-12-19 | Disposition: A | Discharge status: Home / Self Care | Payer: Medicare Other | Source: Ambulatory Visit | Attending: Rheumatology | Admitting: Rheumatology

## 2018-12-19 DIAGNOSIS — M0609 Rheumatoid arthritis without rheumatoid factor, multiple sites: Secondary | ICD-10-CM | POA: Diagnosis not present

## 2018-12-19 MED ORDER — DIPHENHYDRAMINE HCL 25 MG PO CAPS
25.0000 mg | ORAL_CAPSULE | ORAL | Status: DC
  Administered 2018-12-19: 25 mg via ORAL

## 2018-12-19 MED ORDER — ACETAMINOPHEN 325 MG PO TABS
650.0000 mg | ORAL_TABLET | ORAL | Status: DC
  Administered 2018-12-19: 650 mg via ORAL

## 2018-12-19 MED ORDER — SODIUM CHLORIDE 0.9 % IV SOLN
750.0000 mg | INTRAVENOUS | Status: DC
  Administered 2018-12-19: 750 mg via INTRAVENOUS
  Filled 2018-12-19: qty 30

## 2018-12-19 MED ORDER — DIPHENHYDRAMINE HCL 25 MG PO CAPS
ORAL_CAPSULE | ORAL | Status: AC
  Filled 2018-12-19: qty 1

## 2018-12-19 MED ORDER — ACETAMINOPHEN 325 MG PO TABS
ORAL_TABLET | ORAL | Status: AC
  Filled 2018-12-19: qty 2

## 2018-12-22 DIAGNOSIS — Z600 Problems of adjustment to life-cycle transitions: Secondary | ICD-10-CM | POA: Diagnosis not present

## 2018-12-22 DIAGNOSIS — Z6379 Other stressful life events affecting family and household: Secondary | ICD-10-CM | POA: Diagnosis not present

## 2018-12-22 DIAGNOSIS — Z608 Other problems related to social environment: Secondary | ICD-10-CM | POA: Diagnosis not present

## 2018-12-22 DIAGNOSIS — F418 Other specified anxiety disorders: Secondary | ICD-10-CM | POA: Diagnosis not present

## 2018-12-22 DIAGNOSIS — E230 Hypopituitarism: Secondary | ICD-10-CM | POA: Diagnosis not present

## 2018-12-23 DIAGNOSIS — M19042 Primary osteoarthritis, left hand: Secondary | ICD-10-CM | POA: Diagnosis not present

## 2018-12-23 DIAGNOSIS — M79641 Pain in right hand: Secondary | ICD-10-CM | POA: Diagnosis not present

## 2018-12-23 DIAGNOSIS — M25632 Stiffness of left wrist, not elsewhere classified: Secondary | ICD-10-CM | POA: Diagnosis not present

## 2018-12-23 DIAGNOSIS — M25642 Stiffness of left hand, not elsewhere classified: Secondary | ICD-10-CM | POA: Diagnosis not present

## 2018-12-23 DIAGNOSIS — M25631 Stiffness of right wrist, not elsewhere classified: Secondary | ICD-10-CM | POA: Diagnosis not present

## 2018-12-23 DIAGNOSIS — M79642 Pain in left hand: Secondary | ICD-10-CM | POA: Diagnosis not present

## 2018-12-23 DIAGNOSIS — M19041 Primary osteoarthritis, right hand: Secondary | ICD-10-CM | POA: Diagnosis not present

## 2018-12-23 DIAGNOSIS — M25641 Stiffness of right hand, not elsewhere classified: Secondary | ICD-10-CM | POA: Diagnosis not present

## 2018-12-27 ENCOUNTER — Telehealth: Payer: Self-pay | Admitting: Pulmonary Disease

## 2018-12-27 NOTE — Telephone Encounter (Signed)
Patient called into answering service concerned about CoVID  Questions whether he can be placed on hydroxychloroquine  I did advise him that hydroxychloroquine is not approved to be used prophylactically as at the present time  Encouraged him to stay safe Call with any other concerns

## 2018-12-29 ENCOUNTER — Telehealth: Payer: Self-pay | Admitting: Internal Medicine

## 2018-12-29 NOTE — Telephone Encounter (Signed)
Called patient unable to reach and unable to leave VM.

## 2018-12-29 NOTE — Telephone Encounter (Signed)
Spoke with the pt  He states that he heard that the vice president used hydroxychloroquine as a prophylactic med for covid 19  He wants to be prescribed this as well  Already answered by [facility] on call but pt states he does not agree with what he said about it  Please advise thanks

## 2018-12-29 NOTE — Telephone Encounter (Signed)
This drug has no clinical data yet for prophylactic use, and very limited experience for treating active infection. Vision check is advised before starting and after taking because it can cause retinal damage and vision loss, also GI upset. Unless there has been known exposure, I do not recommend it if currently asymptomatic.  The [employer] is obviously a special case, under very close medical supervision.

## 2018-12-30 NOTE — Telephone Encounter (Signed)
ATC pt, no answer and I could not leave a message. Will try back.  

## 2018-12-31 NOTE — Telephone Encounter (Signed)
Spoke with pt. He is aware of CY's response. Nothing further was needed at this time.

## 2019-01-13 DIAGNOSIS — F411 Generalized anxiety disorder: Secondary | ICD-10-CM | POA: Diagnosis not present

## 2019-01-13 DIAGNOSIS — F3181 Bipolar II disorder: Secondary | ICD-10-CM | POA: Diagnosis not present

## 2019-01-16 ENCOUNTER — Ambulatory Visit (HOSPITAL_COMMUNITY)
Admission: RE | Admit: 2019-01-16 | Discharge: 2019-01-16 | Disposition: A | Discharge status: Home / Self Care | Payer: Medicare Other | Source: Ambulatory Visit | Attending: Rheumatology | Admitting: Rheumatology

## 2019-01-16 ENCOUNTER — Other Ambulatory Visit: Payer: Self-pay

## 2019-01-16 DIAGNOSIS — M0609 Rheumatoid arthritis without rheumatoid factor, multiple sites: Secondary | ICD-10-CM

## 2019-01-16 LAB — COMPREHENSIVE METABOLIC PANEL
ALT: 21 U/L (ref 0–44)
AST: 36 U/L (ref 15–41)
Albumin: 3.8 g/dL (ref 3.5–5.0)
Alkaline Phosphatase: 65 U/L (ref 38–126)
Anion gap: 12 (ref 5–15)
BUN: 13 mg/dL (ref 8–23)
CO2: 21 mmol/L — ABNORMAL LOW (ref 22–32)
Calcium: 9.5 mg/dL (ref 8.9–10.3)
Chloride: 102 mmol/L (ref 98–111)
Creatinine, Ser: 1.03 mg/dL (ref 0.61–1.24)
GFR calc Af Amer: >60 mL/min (ref >60)
GFR calc non Af Amer: >60 mL/min (ref >60)
Glucose, Bld: 98 mg/dL (ref 70–99)
Potassium: 4.5 mmol/L (ref 3.5–5.1)
Sodium: 135 mmol/L (ref 135–145)
Total Bilirubin: 0.8 mg/dL (ref 0.3–1.2)
Total Protein: 6.9 g/dL (ref 6.5–8.1)

## 2019-01-16 LAB — CBC
HCT: 41.3 % (ref 39.0–52.0)
Hemoglobin: 13.1 g/dL (ref 13.0–17.0)
MCH: 30.7 pg (ref 26.0–34.0)
MCHC: 31.7 g/dL (ref 30.0–36.0)
MCV: 96.7 fL (ref 80.0–100.0)
Platelets: 338 10*3/uL (ref 150–400)
RBC: 4.27 MIL/uL (ref 4.22–5.81)
RDW: 13.6 % (ref 11.5–15.5)
WBC: 6.5 10*3/uL (ref 4.0–10.5)
nRBC: 0 % (ref 0.0–0.2)

## 2019-01-16 MED ORDER — DIPHENHYDRAMINE HCL 25 MG PO CAPS: 25.0000 mg | ORAL_CAPSULE | ORAL | Status: DC

## 2019-01-16 MED ORDER — SODIUM CHLORIDE 0.9 % IV SOLN
750.0000 mg | INTRAVENOUS | Status: DC
  Administered 2019-01-16: 750 mg via INTRAVENOUS
  Filled 2019-01-16: qty 30

## 2019-01-16 MED ORDER — ACETAMINOPHEN 325 MG PO TABS: 650.0000 mg | ORAL_TABLET | ORAL | Status: DC

## 2019-01-20 NOTE — Progress Notes (Signed)
Labs are stable.

## 2019-02-06 ENCOUNTER — Other Ambulatory Visit: Payer: Self-pay | Admitting: *Deleted

## 2019-02-06 NOTE — Progress Notes (Signed)
Infusion orders are current for patient CBC CMP Tylenol Benadryl appointments are up to date and follow up appointment  is scheduled TB gold not due yet.  

## 2019-02-13 ENCOUNTER — Encounter (HOSPITAL_COMMUNITY): Payer: Self-pay

## 2019-02-13 ENCOUNTER — Encounter (HOSPITAL_COMMUNITY)
Admission: RE | Admit: 2019-02-13 | Discharge: 2019-02-13 | Disposition: A | Discharge status: Home / Self Care | Payer: Medicare Other | Source: Ambulatory Visit | Attending: Rheumatology | Admitting: Rheumatology

## 2019-02-13 ENCOUNTER — Other Ambulatory Visit: Payer: Self-pay

## 2019-02-13 DIAGNOSIS — M0609 Rheumatoid arthritis without rheumatoid factor, multiple sites: Secondary | ICD-10-CM | POA: Diagnosis not present

## 2019-02-13 MED ORDER — SODIUM CHLORIDE 0.9 % IV SOLN
750.0000 mg | INTRAVENOUS | Status: DC
  Administered 2019-02-13: 13:00:00 750 mg via INTRAVENOUS
  Filled 2019-02-13: qty 30

## 2019-02-13 MED ORDER — DIPHENHYDRAMINE HCL 25 MG PO CAPS: 25.0000 mg | ORAL_CAPSULE | ORAL | Status: DC

## 2019-02-13 MED ORDER — ACETAMINOPHEN 325 MG PO TABS: 650.0000 mg | ORAL_TABLET | ORAL | Status: DC

## 2019-02-16 DIAGNOSIS — F411 Generalized anxiety disorder: Secondary | ICD-10-CM | POA: Diagnosis not present

## 2019-02-16 DIAGNOSIS — F3181 Bipolar II disorder: Secondary | ICD-10-CM | POA: Diagnosis not present

## 2019-03-06 ENCOUNTER — Other Ambulatory Visit: Payer: Self-pay | Admitting: *Deleted

## 2019-03-06 NOTE — Progress Notes (Signed)
Infusion orders are current for patient CBC CMP Tylenol Benadryl appointments are up to date and follow up appointment  is scheduled TB gold not due yet.  

## 2019-03-13 ENCOUNTER — Ambulatory Visit (HOSPITAL_COMMUNITY)
Admission: RE | Admit: 2019-03-13 | Discharge: 2019-03-13 | Disposition: A | Discharge status: Home / Self Care | Payer: Medicare Other | Source: Ambulatory Visit | Attending: Rheumatology | Admitting: Rheumatology

## 2019-03-13 ENCOUNTER — Other Ambulatory Visit: Payer: Self-pay

## 2019-03-13 DIAGNOSIS — M0609 Rheumatoid arthritis without rheumatoid factor, multiple sites: Secondary | ICD-10-CM | POA: Insufficient documentation

## 2019-03-13 LAB — CBC
HCT: 40.3 % (ref 39.0–52.0)
Hemoglobin: 13 g/dL (ref 13.0–17.0)
MCH: 31.5 pg (ref 26.0–34.0)
MCHC: 32.3 g/dL (ref 30.0–36.0)
MCV: 97.6 fL (ref 80.0–100.0)
Platelets: 280 10*3/uL (ref 150–400)
RBC: 4.13 MIL/uL — ABNORMAL LOW (ref 4.22–5.81)
RDW: 12.5 % (ref 11.5–15.5)
WBC: 5.1 10*3/uL (ref 4.0–10.5)
nRBC: 0 % (ref 0.0–0.2)

## 2019-03-13 LAB — COMPREHENSIVE METABOLIC PANEL
ALT: 29 U/L (ref 0–44)
AST: 31 U/L (ref 15–41)
Albumin: 3.8 g/dL (ref 3.5–5.0)
Alkaline Phosphatase: 60 U/L (ref 38–126)
Anion gap: 8 (ref 5–15)
BUN: 13 mg/dL (ref 8–23)
CO2: 21 mmol/L — ABNORMAL LOW (ref 22–32)
Calcium: 9 mg/dL (ref 8.9–10.3)
Chloride: 109 mmol/L (ref 98–111)
Creatinine, Ser: 1.17 mg/dL (ref 0.61–1.24)
GFR calc Af Amer: >60 mL/min (ref >60)
GFR calc non Af Amer: >60 mL/min (ref >60)
Glucose, Bld: 122 mg/dL — ABNORMAL HIGH (ref 70–99)
Potassium: 3.7 mmol/L (ref 3.5–5.1)
Sodium: 138 mmol/L (ref 135–145)
Total Bilirubin: 0.6 mg/dL (ref 0.3–1.2)
Total Protein: 6.9 g/dL (ref 6.5–8.1)

## 2019-03-13 MED ORDER — DIPHENHYDRAMINE HCL 25 MG PO CAPS: 25.0000 mg | ORAL_CAPSULE | ORAL | Status: DC

## 2019-03-13 MED ORDER — ACETAMINOPHEN 325 MG PO TABS: 650.0000 mg | ORAL_TABLET | ORAL | Status: DC

## 2019-03-13 MED ORDER — SODIUM CHLORIDE 0.9 % IV SOLN
750.0000 mg | INTRAVENOUS | Status: DC
  Administered 2019-03-13: 750 mg via INTRAVENOUS
  Filled 2019-03-13: qty 30

## 2019-03-18 ENCOUNTER — Telehealth: Payer: Self-pay | Admitting: Internal Medicine

## 2019-03-18 MED ORDER — FLUTICASONE-UMECLIDIN-VILANT 100-62.5-25 MCG/INH IN AEPB: 1.0000 | INHALATION_SPRAY | Freq: Every day | RESPIRATORY_TRACT | 7 refills | Status: DC

## 2019-03-18 NOTE — Telephone Encounter (Signed)
Rx sent Pt aware Nothing further needed.

## 2019-03-19 NOTE — Progress Notes (Deleted)
Office Visit Note  Patient: [name] [name]             Date of Birth: Mar 30, 1951           MRN: [id number]             PCP: Wenda Low, MD Referring: Wenda Low, MD Visit Date: 04/02/2019 Occupation: @GUAROCC @  Subjective:  No chief complaint on file.  Orencia IV 750 mg every 4 weeks with last infusion on 03/13/2019.  Last TB gold negative on 08/12/2018 and will monitor yearly.  Most recent CMP within normal limits and CBC within normal limits except borderline low RBC count on 03/13/2019 and will monitor every 3 months.  He received a high-dose flu vaccine in September and previously Pneumovax 23 in both Shingrix vaccines.  Recommend Prevnar 13 vaccine as indicated.  History of Present Illness: [name] [name] is a 68 y.o. male ***   Activities of Daily Living:  Patient reports morning stiffness for *** {minute/hour:19697}.   Patient {ACTIONS;DENIES/REPORTS:21021675::"Denies"} nocturnal pain.  Difficulty dressing/grooming: {ACTIONS;DENIES/REPORTS:21021675::"Denies"} Difficulty climbing stairs: {ACTIONS;DENIES/REPORTS:21021675::"Denies"} Difficulty getting out of chair: {ACTIONS;DENIES/REPORTS:21021675::"Denies"} Difficulty using hands for taps, buttons, cutlery, and/or writing: {ACTIONS;DENIES/REPORTS:21021675::"Denies"}  No Rheumatology ROS completed.   PMFS History:  Patient Active Problem List   Diagnosis Date Noted  . History of gastroesophageal reflux (GERD) 05/29/2018  . High risk medication use 05/29/2018  . History of IBS 05/29/2018  . History of depression 07/15/2017  . History of asthma 07/08/2017  . 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 Asthmatic bronchitis, mild intermittent, uncomplicated 11/94/1740  . Asthmatic bronchitis with acute exacerbation 10/15/2016  . Herpes zoster 08/01/2013  . Rheumatoid arthritis of multiple sites without rheumatoid factor ([place]) 09/18/2010  . NASAL POLYP 12/24/2007  . Sinusitis, chronic 12/24/2007  .  Seasonal and perennial allergic rhinitis 12/24/2007  . Allergic-infective asthma 12/24/2007  . G E R D 12/24/2007    Past Medical History:  Diagnosis Date  . ALLERGIC RHINITIS   . Asthma   . Bipolar depression ([place])   . GERD (gastroesophageal reflux disease)   . Rheumatoid arthritis(714.0)     Family History  Problem Relation Age of Onset  . Multiple sclerosis Father   . Breast cancer Mother   . Breast cancer Sister   . Alcoholism Child        now sober   Past Surgical History:  Procedure Laterality Date  . CARPAL TUNNEL RELEASE Left   . extensive sinus surgery with ablation of the frontal sinuses    . HERNIA REPAIR    . nasal polypectomies     Social History   Social History Narrative  . Not on file   Immunization History  Administered Date(s) Administered  . Influenza Split 07/08/2012, 07/06/2013, 07/08/2016  . Influenza, High Dose Seasonal PF 06/13/2018  . Influenza,inj,Quad PF,6+ Mos 07/15/2014, 06/19/2017  . Pneumococcal Polysaccharide-23 07/15/2014  . Zoster Recombinat (Shingrix) 05/23/2018, 08/21/2018     Objective: Vital Signs: There were no vitals taken for this visit.   Physical Exam   Musculoskeletal Exam: ***  CDAI Exam: CDAI Score: - Patient Global: -; Provider Global: - Swollen: -; Tender: - Joint Exam   No joint exam has been documented for this visit   There is currently no information documented on the homunculus. Go to the Rheumatology activity and complete the homunculus joint exam.  Investigation: No additional findings.  Imaging: No results found.  Recent Labs: Lab Results  Component Value Date   WBC 5.1 03/13/2019  HGB 13.0 03/13/2019   PLT 280 03/13/2019   NA 138 03/13/2019   K 3.7 03/13/2019   CL 109 03/13/2019   CO2 21 (L) 03/13/2019   GLUCOSE 122 (H) 03/13/2019   BUN 13 03/13/2019   CREATININE 1.17 03/13/2019   BILITOT 0.6 03/13/2019   ALKPHOS 60 03/13/2019   AST 31 03/13/2019   ALT 29 03/13/2019   PROT 6.9  03/13/2019   ALBUMIN 3.8 03/13/2019   CALCIUM 9.0 03/13/2019   GFRAA >60 03/13/2019   QFTBGOLD NEGATIVE 07/16/2017   QFTBGOLDPLUS Negative 08/12/2018    Speciality Comments: No specialty comments available.  Procedures:  No procedures performed Allergies: Simponi [golimumab], Aspirin, Other, Sulfonamide derivatives, and Theophyllines   Assessment / Plan:     Visit Diagnoses: No diagnosis found.   Orders: No orders of the defined types were placed in this encounter.  No orders of the defined types were placed in this encounter.   Face-to-face time spent with patient was *** minutes. Greater than 50% of time was spent in counseling and coordination of care.  Follow-Up Instructions: No follow-ups on file.   Ofilia Neas, PA-C  Note - This record has been created using Dragon software.  Chart creation errors have been sought, but may not always  have been located. Such creation errors do not reflect on  the standard of medical care.

## 2019-03-20 ENCOUNTER — Telehealth: Payer: Self-pay | Admitting: Internal Medicine

## 2019-03-20 MED ORDER — AMOXICILLIN-POT CLAVULANATE 875-125 MG PO TABS: 1.0000 | ORAL_TABLET | Freq: Two times a day (BID) | ORAL | 0 refills | Status: DC

## 2019-03-20 NOTE — Telephone Encounter (Signed)
Called and spoke with pt letting him know that CY said to send in abx augmentin for him to take and pt verbalized understanding. Verified pt's preferred pharmacy and sent Rx in for pt. Nothing further needed.

## 2019-03-20 NOTE — Telephone Encounter (Signed)
Suggest augmentin 875 mg, # 20, 1 twice daily

## 2019-03-20 NOTE — Telephone Encounter (Signed)
Called and spoke with pt who believes he has a upper respiratory infection as he has been coughing up green phlegm and also has green postnasal drainage. Pt stated his symptoms began 4 days ago.   Pt states he has rinsed his head with salt water. Pt still taking flonase and singulair as prescribed, has used his proair at least twice a day and also is using trelegy ad directed.  Pt stated that he also takes OTC generic flu pill which contains guaifenesin and acetaminophen in it. Pt states he usually takes one in the morning.  Pt is wanting recommendations to help with his symptoms. Dr. Annamaria Boots, please advise on this for pt. Thanks!  Allergies  Allergen Reactions  . Simponi [Golimumab]     Repeated infections  . Aspirin     REACTION: throat swelling  . Other Other (See Comments)    REACTION: oral sores  . Sulfonamide Derivatives     REACTION: oral sores  . Theophyllines      Current Outpatient Medications:  .  Abatacept (ORENCIA IV), Inject into the vein every 30 (thirty) days., Disp: , Rfl:  .  albuterol (PROAIR HFA) 108 (90 Base) MCG/ACT inhaler, INHALE TWO PUFFS BY MOUTH EVERY 4 HOURS, Disp: 8.5 each, Rfl: 12 .  albuterol (PROAIR HFA) 108 (90 Base) MCG/ACT inhaler, Inhale 1-2 puffs into the lungs every 6 (six) hours as needed for wheezing or shortness of breath., Disp: 1 Inhaler, Rfl: 5 .  ARIPiprazole (ABILIFY) 2 MG tablet, Take 7 mg by mouth daily. , Disp: , Rfl:  .  cholestyramine (QUESTRAN) 4 GM/DOSE powder, , Disp: , Rfl:  .  clonazePAM (KLONOPIN) 1 MG tablet, Take 1 tablet by mouth daily., Disp: , Rfl:  .  fluticasone (FLONASE) 50 MCG/ACT nasal spray, Place 1 spray into both nostrils daily., Disp: 16 g, Rfl: 11 .  Fluticasone-Umeclidin-Vilant (TRELEGY ELLIPTA) 100-62.5-25 MCG/INH AEPB, Inhale 1 puff into the lungs daily., Disp: 60 each, Rfl: 7 .  lithium carbonate 300 MG capsule, Take 300 mg by mouth once. , Disp: , Rfl:  .  losartan-hydrochlorothiazide (HYZAAR) 100-25 MG tablet,  Take 1 tablet by mouth daily. , Disp: , Rfl:  .  montelukast (SINGULAIR) 10 MG tablet, Take 1 tablet (10 mg total) by mouth daily., Disp: 90 tablet, Rfl: 3 .  Multiple Vitamins-Minerals (MULTIVITAMIN WITH MINERALS) tablet, Take by mouth., Disp: , Rfl:  .  nystatin (MYCOSTATIN) 100000 UNIT/ML suspension, Take 5 mLs by mouth daily. , Disp: , Rfl:  .  Omeprazole (PRILOSEC PO), Take by mouth 2 (two) times daily., Disp: , Rfl:  .  Probiotic Product (PROBIOTIC PO), Take by mouth., Disp: , Rfl:  .  sildenafil (VIAGRA) 100 MG tablet, Take 100 mg by mouth daily as needed.  , Disp: , Rfl:  .  Testosterone 20.25 MG/ACT (1.62%) GEL, Apply 2 pumps as directed once a day, Disp: , Rfl:

## 2019-03-30 ENCOUNTER — Telehealth: Payer: Self-pay | Admitting: Internal Medicine

## 2019-03-30 MED ORDER — AMOXICILLIN-POT CLAVULANATE 875-125 MG PO TABS: 1.0000 | ORAL_TABLET | Freq: Two times a day (BID) | ORAL | 0 refills | Status: DC

## 2019-03-30 NOTE — Telephone Encounter (Signed)
Ok to extend Augmentin 875  Mg, # 20, 1 twice daily  Please let us know if this doesn't take care of it.

## 2019-03-30 NOTE — Telephone Encounter (Signed)
I attempted to call pt w/ CY's recommendations, however, pt did not pick up at the time of the call. I have left a message for pt to call back.  I have also pended the order for: Augmentin 875  Mg, # 20, 1 twice daily  to [facility]  Will keep encounter in triage box to f/u on.

## 2019-03-30 NOTE — Telephone Encounter (Signed)
Attempted to try to call pt again but still unable to reach. Since Janeth Rase left message already for pt to call, will await a return call from pt.

## 2019-03-30 NOTE — Telephone Encounter (Signed)
Called & spoke w/ pt regarding CY's recommendations. Pt verbalized understanding with no additional questions. I verified pt's pharmacy: [facility] in [place].   Order for Augmentin 875 mg, #20, 1 twice daily has been sent. Nothing further needed at this time.

## 2019-03-30 NOTE — Telephone Encounter (Signed)
Pt called 6/12 stating that he was not feeling well as he believed he was getting a URI. augmentin was prescribed for pt to take twice daily for 10 days.    Called and spoke with pt who states he is still coughing with green phlegm and also has nasal congestion. Pt stated he did take the last dose of augmentin this morning 6/22.   Asked pt if he was running a fever and pt stated that he was unsure as he has not checked it recently. Pt stated that he has felt sort-of lousy and is also weak-feeling.  Due to pt's symptoms still not being fully gone, pt is requesting to have the abx repeated.  Dr. Annamaria Boots, please advise on this for pt. Thanks!  Allergies  Allergen Reactions  . Simponi [Golimumab]     Repeated infections  . Aspirin     REACTION: throat swelling  . Other Other (See Comments)    REACTION: oral sores  . Sulfonamide Derivatives     REACTION: oral sores  . Theophyllines      Current Outpatient Medications:  .  Abatacept (ORENCIA IV), Inject into the vein every 30 (thirty) days., Disp: , Rfl:  .  albuterol (PROAIR HFA) 108 (90 Base) MCG/ACT inhaler, INHALE TWO PUFFS BY MOUTH EVERY 4 HOURS, Disp: 8.5 each, Rfl: 12 .  albuterol (PROAIR HFA) 108 (90 Base) MCG/ACT inhaler, Inhale 1-2 puffs into the lungs every 6 (six) hours as needed for wheezing or shortness of breath., Disp: 1 Inhaler, Rfl: 5 .  amoxicillin-clavulanate (AUGMENTIN) 875-125 MG tablet, Take 1 tablet by mouth 2 (two) times daily., Disp: 20 tablet, Rfl: 0 .  ARIPiprazole (ABILIFY) 2 MG tablet, Take 7 mg by mouth daily. , Disp: , Rfl:  .  cholestyramine (QUESTRAN) 4 GM/DOSE powder, , Disp: , Rfl:  .  clonazePAM (KLONOPIN) 1 MG tablet, Take 1 tablet by mouth daily., Disp: , Rfl:  .  fluticasone (FLONASE) 50 MCG/ACT nasal spray, Place 1 spray into both nostrils daily., Disp: 16 g, Rfl: 11 .  Fluticasone-Umeclidin-Vilant (TRELEGY ELLIPTA) 100-62.5-25 MCG/INH AEPB, Inhale 1 puff into the lungs daily., Disp: 60 each, Rfl:  7 .  lithium carbonate 300 MG capsule, Take 300 mg by mouth once. , Disp: , Rfl:  .  losartan-hydrochlorothiazide (HYZAAR) 100-25 MG tablet, Take 1 tablet by mouth daily. , Disp: , Rfl:  .  montelukast (SINGULAIR) 10 MG tablet, Take 1 tablet (10 mg total) by mouth daily., Disp: 90 tablet, Rfl: 3 .  Multiple Vitamins-Minerals (MULTIVITAMIN WITH MINERALS) tablet, Take by mouth., Disp: , Rfl:  .  nystatin (MYCOSTATIN) 100000 UNIT/ML suspension, Take 5 mLs by mouth daily. , Disp: , Rfl:  .  Omeprazole (PRILOSEC PO), Take by mouth 2 (two) times daily., Disp: , Rfl:  .  Probiotic Product (PROBIOTIC PO), Take by mouth., Disp: , Rfl:  .  sildenafil (VIAGRA) 100 MG tablet, Take 100 mg by mouth daily as needed.  , Disp: , Rfl:  .  Testosterone 20.25 MG/ACT (1.62%) GEL, Apply 2 pumps as directed once a day, Disp: , Rfl:

## 2019-04-01 ENCOUNTER — Telehealth: Payer: Self-pay | Admitting: Rheumatology

## 2019-04-01 NOTE — Telephone Encounter (Signed)
Patient states he was diagnosed with upper respiratory infection and is taking Amoxicillin.  Patient states he will finish the medication on 04/14/19.  Patient is requesting a return call to let him know when it is safe to schedule Orencia infusion.

## 2019-04-01 NOTE — Telephone Encounter (Signed)
Patient advised that he should wait until he has completed the antibiotics and has been cleared by the PCP prior to scheduling the Orencia infusion. Patient verbalized understanding.

## 2019-04-02 ENCOUNTER — Ambulatory Visit: Payer: Self-pay | Admitting: Physician Assistant

## 2019-04-03 ENCOUNTER — Other Ambulatory Visit: Payer: Self-pay | Admitting: *Deleted

## 2019-04-03 NOTE — Progress Notes (Signed)
Infusion orders are current for patient CBC CMP Tylenol Benadryl appointments are up to date and follow up appointment  is scheduled TB gold not due yet.  

## 2019-04-06 NOTE — Progress Notes (Signed)
Office Visit Note  Patient: [name] [name]             Date of Birth: December 09, 1950           MRN: [id number]             PCP: Wenda Low, MD Referring: Wenda Low, MD Visit Date: 04/20/2019 Occupation: @GUAROCC @  Subjective:  Pain and stiffness in joints.    History of Present Illness: [name] [name] is a 68 y.o. male with history of seronegative rheumatoid arthritis.  He states about a month ago he developed fever cough and chills.  He was seen by his PCP and also was evaluated at [facility].  He states that he was on antibiotics for more than a month.  His COVID test was negative.  He has not had any fever for the last 2 weeks.  He still has some cough.  He has some white phlegm coming out.  He has been off erythromycin for 1 week now.  He was treated with 2 courses of Augmentin before that.  He is a still holding Orencia since the illness.  He has been experiencing joint pain.  He complains of right shoulder, left hip and left knee pain.  He has some joint stiffness.  Patient states that he needs some dental work and he will have to decide to take [place] prior to the dental work or after it.  Activities of Daily Living:  Patient reports morning stiffness for 1 hour.   Patient Reports nocturnal pain.  Difficulty dressing/grooming: Denies Difficulty climbing stairs: Denies Difficulty getting out of chair: Reports Difficulty using hands for taps, buttons, cutlery, and/or writing: Reports  Review of Systems  Constitutional: Negative for fatigue and night sweats.  HENT: Positive for mouth dryness. Negative for mouth sores and nose dryness.   Eyes: Negative for pain, redness, itching and dryness.  Respiratory: Negative for shortness of breath, wheezing and difficulty breathing.   Cardiovascular: Negative for chest pain, palpitations, hypertension, irregular heartbeat and swelling in legs/feet.  Gastrointestinal: Negative for abdominal pain, constipation and diarrhea.  Endocrine:  Negative for increased urination.  Genitourinary: Negative for painful urination and pelvic pain.  Musculoskeletal: Positive for arthralgias, joint pain and morning stiffness. Negative for joint swelling, myalgias, muscle weakness, muscle tenderness and myalgias.  Skin: Negative for color change, rash, hair loss, nodules/bumps, redness, skin tightness, ulcers and sensitivity to sunlight.  Allergic/Immunologic: Negative for susceptible to infections.  Neurological: Positive for headaches and weakness. Negative for dizziness, fainting, light-headedness, memory loss and night sweats.  Hematological: Negative for bruising/bleeding tendency and swollen glands.  Psychiatric/Behavioral: Negative for depressed mood, confusion and sleep disturbance. The patient is not nervous/anxious.     PMFS History:  Patient Active Problem List   Diagnosis Date Noted  . History of gastroesophageal reflux (GERD) 05/29/2018  . High risk medication use 05/29/2018  . History of IBS 05/29/2018  . History of depression 07/15/2017  . History of asthma 07/08/2017  . 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 Asthmatic bronchitis, mild intermittent, uncomplicated 29/92/4268  . Asthmatic bronchitis with acute exacerbation 10/15/2016  . Herpes zoster 08/01/2013  . Rheumatoid arthritis of multiple sites without rheumatoid factor ([place]) 09/18/2010  . NASAL POLYP 12/24/2007  . Sinusitis, chronic 12/24/2007  . Seasonal and perennial allergic rhinitis 12/24/2007  . Allergic-infective asthma 12/24/2007  . G E R D 12/24/2007    Past Medical History:  Diagnosis Date  . ALLERGIC RHINITIS   . Asthma   . Bipolar  depression ([place])   . GERD (gastroesophageal reflux disease)   . Rheumatoid arthritis(714.0)     Family History  Problem Relation Age of Onset  . Multiple sclerosis Father   . Breast cancer Mother   . Breast cancer Sister   . Alcoholism Child        now sober   Past Surgical History:  Procedure  Laterality Date  . CARPAL TUNNEL RELEASE Left   . extensive sinus surgery with ablation of the frontal sinuses    . HERNIA REPAIR    . nasal polypectomies     Social History   Social History Narrative  . Not on file   Immunization History  Administered Date(s) Administered  . Influenza Split 07/08/2012, 07/06/2013, 07/08/2016  . Influenza, High Dose Seasonal PF 06/13/2018  . Influenza,inj,Quad PF,6+ Mos 07/15/2014, 06/19/2017  . Pneumococcal Polysaccharide-23 07/15/2014  . Zoster Recombinat (Shingrix) 05/23/2018, 08/21/2018     Objective: Vital Signs: BP 117/77 (BP Location: Left Arm, Patient Position: Sitting, Cuff Size: Normal)   Pulse 65   Resp 14   Ht 5\' 8"  (1.727 m)   Wt 194 lb (88 kg)   BMI 29.50 kg/m    Physical Exam Vitals signs and nursing note reviewed.  Constitutional:      Appearance: He is well-developed.  HENT:     Head: Normocephalic and atraumatic.  Eyes:     Conjunctiva/sclera: Conjunctivae normal.     Pupils: Pupils are equal, round, and reactive to light.  Neck:     Musculoskeletal: Normal range of motion and neck supple.  Cardiovascular:     Rate and Rhythm: Normal rate and regular rhythm.     Heart sounds: Normal heart sounds.  Pulmonary:     Effort: Pulmonary effort is normal.     Breath sounds: Normal breath sounds.  Abdominal:     General: Bowel sounds are normal.     Palpations: Abdomen is soft.  Skin:    General: Skin is warm and dry.     Capillary Refill: Capillary refill takes less than 2 seconds.  Neurological:     Mental Status: He is alert and oriented to person, place, and time.  Psychiatric:        Behavior: Behavior normal.      Musculoskeletal Exam: He has some stiffness of range of motion of the cervical spine.  Shoulder joints elbow joints with good range of motion.  He has bilateral MCP thickening with ulnar deviation.  DIP and PIP thickening was noted.  No synovitis was noted.  He had good range of motion of his hip  joints and, knee joints and ankle joints.  He had some discomfort range of motion of his left hip and left knee joint.  No warmth swelling or effusion was noted.  CDAI Exam: CDAI Score: 2.6  Patient Global: 3 mm; Provider Global: 3 mm Swollen: 0 ; Tender: 3  Joint Exam      Right  Left  Glenohumeral   Tender     Hip      Tender  Knee      Tender     Investigation: No additional findings.  Imaging: No results found.  Recent Labs: Lab Results  Component Value Date   WBC 5.1 03/13/2019   HGB 13.0 03/13/2019   PLT 280 03/13/2019   NA 138 03/13/2019   K 3.7 03/13/2019   CL 109 03/13/2019   CO2 21 (L) 03/13/2019   GLUCOSE 122 (H) 03/13/2019   BUN 13  03/13/2019   CREATININE 1.17 03/13/2019   BILITOT 0.6 03/13/2019   ALKPHOS 60 03/13/2019   AST 31 03/13/2019   ALT 29 03/13/2019   PROT 6.9 03/13/2019   ALBUMIN 3.8 03/13/2019   CALCIUM 9.0 03/13/2019   GFRAA >60 03/13/2019   QFTBGOLD NEGATIVE 07/16/2017   QFTBGOLDPLUS Negative 08/12/2018    Speciality Comments: No specialty comments available.  Procedures:  No procedures performed Allergies: Simponi [golimumab], Aspirin, Other, Sulfonamide derivatives, and Theophyllines   Assessment / Plan:     Visit Diagnoses: Rheumatoid arthritis of multiple sites without rheumatoid factor ([place]) -he has been experiencing increased stiffness and discomfort in his joints.  Although he did not have any synovitis on examination.  He has been off Orencia IV due to recent respiratory infection.  He states he had 2 courses of Augmentin and then erythromycin.  He finished antibiotics a week ago.  Once he gets clearance from his PCP then we can start his Orencia infusion.  He is also waiting to get some dental work.  He can have dental work now or a month after restarting [place].  He will discuss that further with his dentist.  High risk medication use - Orencia IV on hold currently.  His labs in June were normal.  He will get next labs with  next infusion.Orencia 750 mg IV infusion every 28 days.  Last infusion 03/13/2019.  Last TB gold negative on 08/12/2018 and will monitor yearly.  Most recent CBC/CMP stable on 03/13/2019 and will monitor every 3 months.  He received the high-dose flu vaccine in September and previously Pneumovax 23 and Shingrix vaccines.  Recommend Prevnar 13 as indicated for immunosuppressive therapy.  Primary osteoarthritis of both hands - Plan: Joint protection muscle strengthening was discussed.  Coarse tremors - Plan: Appears to be improved.  History of depression - Plan: He is on medications.  History of gastroesophageal reflux (GERD)   History of IBS   History of asthma   Family history of MS (multiple sclerosis)    Orders: No orders of the defined types were placed in this encounter.  No orders of the defined types were placed in this encounter.     Follow-Up Instructions: Return in about 5 months (around 09/20/2019) for Rheumatoid arthritis.   Bo Merino, MD  Note - This record has been created using Editor, commissioning.  Chart creation errors have been sought, but may not always  have been located. Such creation errors do not reflect on  the standard of medical care.

## 2019-04-09 ENCOUNTER — Telehealth: Payer: Self-pay | Admitting: Internal Medicine

## 2019-04-09 ENCOUNTER — Encounter (HOSPITAL_COMMUNITY): Payer: Medicare Other

## 2019-04-09 DIAGNOSIS — R531 Weakness: Secondary | ICD-10-CM | POA: Diagnosis not present

## 2019-04-09 DIAGNOSIS — R05 Cough: Secondary | ICD-10-CM | POA: Diagnosis not present

## 2019-04-09 DIAGNOSIS — J45909 Unspecified asthma, uncomplicated: Secondary | ICD-10-CM | POA: Diagnosis not present

## 2019-04-09 NOTE — Telephone Encounter (Signed)
Spoke with patient. He stated that he was able to get in touch with Dr. Glenna Durand office in regards to his symptoms. Nothing further needed at time of call.

## 2019-04-10 DIAGNOSIS — B9689 Other specified bacterial agents as the cause of diseases classified elsewhere: Secondary | ICD-10-CM | POA: Diagnosis not present

## 2019-04-10 DIAGNOSIS — J019 Acute sinusitis, unspecified: Secondary | ICD-10-CM | POA: Diagnosis not present

## 2019-04-10 DIAGNOSIS — J209 Acute bronchitis, unspecified: Secondary | ICD-10-CM | POA: Diagnosis not present

## 2019-04-10 DIAGNOSIS — Z209 Contact with and (suspected) exposure to unspecified communicable disease: Secondary | ICD-10-CM | POA: Diagnosis not present

## 2019-04-20 ENCOUNTER — Other Ambulatory Visit: Payer: Self-pay

## 2019-04-20 ENCOUNTER — Encounter: Payer: Self-pay | Admitting: Physician Assistant

## 2019-04-20 ENCOUNTER — Ambulatory Visit (INDEPENDENT_AMBULATORY_CARE_PROVIDER_SITE_OTHER): Payer: Medicare Other | Admitting: Rheumatology

## 2019-04-20 ENCOUNTER — Telehealth: Payer: Self-pay | Admitting: Rheumatology

## 2019-04-20 VITALS — BP 117/77 | HR 65 | Resp 14 | Ht 68.0 in | Wt 194.0 lb

## 2019-04-20 DIAGNOSIS — Z8719 Personal history of other diseases of the digestive system: Secondary | ICD-10-CM

## 2019-04-20 DIAGNOSIS — M19041 Primary osteoarthritis, right hand: Secondary | ICD-10-CM

## 2019-04-20 DIAGNOSIS — Z79899 Other long term (current) drug therapy: Secondary | ICD-10-CM | POA: Diagnosis not present

## 2019-04-20 DIAGNOSIS — Z820 Family history of epilepsy and other diseases of the nervous system: Secondary | ICD-10-CM | POA: Diagnosis not present

## 2019-04-20 DIAGNOSIS — G252 Other specified forms of tremor: Secondary | ICD-10-CM

## 2019-04-20 DIAGNOSIS — Z8709 Personal history of other diseases of the respiratory system: Secondary | ICD-10-CM | POA: Diagnosis not present

## 2019-04-20 DIAGNOSIS — M0609 Rheumatoid arthritis without rheumatoid factor, multiple sites: Secondary | ICD-10-CM

## 2019-04-20 DIAGNOSIS — M19042 Primary osteoarthritis, left hand: Secondary | ICD-10-CM | POA: Diagnosis not present

## 2019-04-20 DIAGNOSIS — Z8659 Personal history of other mental and behavioral disorders: Secondary | ICD-10-CM | POA: Diagnosis not present

## 2019-04-20 NOTE — Telephone Encounter (Signed)
Patient left a voicemail requesting a return call to discuss Orencia injections.

## 2019-04-21 ENCOUNTER — Telehealth: Payer: Self-pay | Admitting: Rheumatology

## 2019-04-21 NOTE — Telephone Encounter (Signed)
Patient advised we need the clearance letter from PCP prior to placing infusion orders.

## 2019-04-21 NOTE — Telephone Encounter (Signed)
Patient calling because he is due for Orencia injection. Patient wants to know if he is okay to call, and schedule that? Or does the orders need to be put in first? Please call to advise.

## 2019-04-22 ENCOUNTER — Telehealth: Payer: Self-pay | Admitting: Rheumatology

## 2019-04-22 NOTE — Telephone Encounter (Signed)
Attempted to contact the patient and left message to advise patient we have not received clearance letter from PCP.

## 2019-04-22 NOTE — Telephone Encounter (Signed)
Patient would like to know if we received information from his PCP to release him for his Orencia injection. Patient is a month late now due to infection he had. Please call to advise.

## 2019-04-24 DIAGNOSIS — M069 Rheumatoid arthritis, unspecified: Secondary | ICD-10-CM | POA: Diagnosis not present

## 2019-04-24 DIAGNOSIS — R05 Cough: Secondary | ICD-10-CM | POA: Diagnosis not present

## 2019-04-27 ENCOUNTER — Telehealth: Payer: Self-pay

## 2019-04-27 NOTE — Telephone Encounter (Signed)
Received clearance via fax from Dr. Felipa Eth. Dr. Estanislado Pandy has reviewed. Advised patient that clearance has been received and he can call to schedule infusion. Infusion order is in place.

## 2019-05-04 ENCOUNTER — Other Ambulatory Visit: Payer: Self-pay

## 2019-05-04 ENCOUNTER — Ambulatory Visit (HOSPITAL_COMMUNITY)
Admission: RE | Admit: 2019-05-04 | Discharge: 2019-05-04 | Disposition: A | Discharge status: Home / Self Care | Payer: Medicare Other | Source: Ambulatory Visit | Attending: Rheumatology | Admitting: Rheumatology

## 2019-05-04 DIAGNOSIS — M0609 Rheumatoid arthritis without rheumatoid factor, multiple sites: Secondary | ICD-10-CM | POA: Insufficient documentation

## 2019-05-04 MED ORDER — SODIUM CHLORIDE 0.9 % IV SOLN
750.0000 mg | INTRAVENOUS | Status: DC
  Administered 2019-05-04: 750 mg via INTRAVENOUS
  Filled 2019-05-04: qty 30

## 2019-05-04 MED ORDER — ACETAMINOPHEN 325 MG PO TABS: 650.0000 mg | ORAL_TABLET | ORAL | Status: DC

## 2019-05-04 MED ORDER — DIPHENHYDRAMINE HCL 25 MG PO CAPS: 25.0000 mg | ORAL_CAPSULE | ORAL | Status: DC

## 2019-05-14 ENCOUNTER — Other Ambulatory Visit: Payer: Self-pay

## 2019-05-14 ENCOUNTER — Ambulatory Visit (INDEPENDENT_AMBULATORY_CARE_PROVIDER_SITE_OTHER): Payer: Medicare Other | Admitting: Adult Health

## 2019-05-14 DIAGNOSIS — F411 Generalized anxiety disorder: Secondary | ICD-10-CM | POA: Diagnosis not present

## 2019-05-14 DIAGNOSIS — F331 Major depressive disorder, recurrent, moderate: Secondary | ICD-10-CM | POA: Diagnosis not present

## 2019-05-14 DIAGNOSIS — F39 Unspecified mood [affective] disorder: Secondary | ICD-10-CM | POA: Diagnosis not present

## 2019-05-14 DIAGNOSIS — G4700 Insomnia, unspecified: Secondary | ICD-10-CM | POA: Diagnosis not present

## 2019-05-14 MED ORDER — ARIPIPRAZOLE 5 MG PO TABS: 7.5000 mg | ORAL_TABLET | Freq: Every day | ORAL | 5 refills | Status: DC

## 2019-05-14 MED ORDER — CLONAZEPAM 1 MG PO TABS: 1.0000 mg | ORAL_TABLET | Freq: Every day | ORAL | 2 refills | Status: DC

## 2019-05-28 ENCOUNTER — Other Ambulatory Visit: Payer: Self-pay | Admitting: *Deleted

## 2019-05-28 NOTE — Progress Notes (Signed)
Infusion orders are current for patient CBC CMP Tylenol Benadryl appointments are up to date and follow up appointment  is scheduled TB gold not due yet.  

## 2019-05-30 ENCOUNTER — Encounter: Payer: Self-pay | Admitting: Adult Health

## 2019-05-30 NOTE — Progress Notes (Signed)
[name] [name] [id number] October 12, 1950 68 y.o.  Subjective:   Patient ID:  [name] [name] is a 68 y.o. (DOB [birth year]) male.  Chief Complaint: No chief complaint on file.   HPI [name] [name] presents to the office today for follow-up of Anxiety, Depression, Mood disorder, and Insomnia  Describes mood today as "ok". Pleasant. Mood symptoms - denies depression, anxiety, and irritability. Stating "I'm doing great except fot Lethargy". Stating "I need to fine tune some things". Feels "lazy and sleepy". Mood is "good". No longer feels angry. Stating "the anger was scary", "you can't make me irritable if you hit me in the face". Stable interest and motivation. Feels the Abilify has done "amazing things" for him. Taking medications as prescribed.  Energy levels decreased. Feels "lethargic". Taking the Abilify in the mornings. Some restlessness during the day but "mostly" gone.  Enjoys some usual interests and activities. Spending time with family dog "Minnie". Walking around neighborhood.Marland Kitchen Appetite adequate. Weight stable - 190. Sleeps well most nights. Averages 8 to 10 hours. Focus and concentration stable. Completing tasks. Managing aspects of household.  Denies SI or HI. Denies AH or VH.   Review of Systems:  Review of Systems  Musculoskeletal: Negative for gait problem.  Neurological: Negative for tremors.  Psychiatric/Behavioral:       Please refer to HPI    Medications: I have reviewed the patient's current medications.  Current Outpatient Medications  Medication Sig Dispense Refill  . Abatacept (ORENCIA IV) Inject into the vein every 30 (thirty) days.    Marland Kitchen acetaminophen (TYLENOL) 325 MG tablet Take 650 mg by mouth every 6 (six) hours as needed.    Marland Kitchen albuterol (PROAIR HFA) 108 (90 Base) MCG/ACT inhaler INHALE TWO PUFFS BY MOUTH EVERY 4 HOURS (Patient not taking: Reported on 04/20/2019) 8.5 each 12  . albuterol (PROAIR HFA) 108 (90 Base) MCG/ACT inhaler Inhale 1-2 puffs into  the lungs every 6 (six) hours as needed for wheezing or shortness of breath. (Patient not taking: Reported on 04/20/2019) 1 Inhaler 5  . amoxicillin-clavulanate (AUGMENTIN) 875-125 MG tablet Take 1 tablet by mouth 2 (two) times daily. (Patient not taking: Reported on 04/20/2019) 20 tablet 0  . amoxicillin-clavulanate (AUGMENTIN) 875-125 MG tablet Take 1 tablet by mouth 2 (two) times daily. (Patient not taking: Reported on 04/20/2019) 20 tablet 0  . ARIPiprazole (ABILIFY) 5 MG tablet Take 1.5 tablets (7.5 mg total) by mouth daily. 45 tablet 5  . Ascorbic Acid (VITAMIN C PO) Take by mouth daily.    . cholestyramine (QUESTRAN) 4 GM/DOSE powder     . clonazePAM (KLONOPIN) 1 MG tablet Take 1 tablet (1 mg total) by mouth at bedtime. 30 tablet 2  . fluticasone (FLONASE) 50 MCG/ACT nasal spray Place 1 spray into both nostrils daily. 16 g 11  . Fluticasone-Umeclidin-Vilant (TRELEGY ELLIPTA) 100-62.5-25 MCG/INH AEPB Inhale 1 puff into the lungs daily. (Patient not taking: Reported on 04/20/2019) 60 each 7  . guaiFENesin (MUCUS RELIEF ADULT PO) Take by mouth daily.    Marland Kitchen lithium carbonate 300 MG capsule Take 300 mg by mouth once.     . loratadine (CLARITIN) 10 MG tablet Take 10 mg by mouth daily.    Marland Kitchen losartan-hydrochlorothiazide (HYZAAR) 100-25 MG tablet Take 1 tablet by mouth daily.     . montelukast (SINGULAIR) 10 MG tablet Take 1 tablet (10 mg total) by mouth daily. (Patient not taking: Reported on 04/20/2019) 90 tablet 3  . Multiple Vitamins-Minerals (MULTIVITAMIN WITH MINERALS) tablet Take by mouth.    Marland Kitchen  Multiple Vitamins-Minerals (ZINC PO) Take by mouth.    . nystatin (MYCOSTATIN) 100000 UNIT/ML suspension Take 5 mLs by mouth daily.     . Omeprazole (PRILOSEC PO) Take by mouth 2 (two) times daily.    . Probiotic Product (PROBIOTIC PO) Take by mouth.    . sildenafil (VIAGRA) 100 MG tablet Take 100 mg by mouth daily as needed.      . Testosterone 20.25 MG/ACT (1.62%) GEL Apply 2 pumps as directed once a day      No current facility-administered medications for this visit.     Medication Side Effects: None  Allergies:  Allergies  Allergen Reactions  . Simponi [Golimumab]     Repeated infections  . Aspirin     REACTION: throat swelling  . Other Other (See Comments)    REACTION: oral sores  . Sulfonamide Derivatives     REACTION: oral sores  . Theophyllines     Past Medical History:  Diagnosis Date  . ALLERGIC RHINITIS   . Asthma   . Bipolar depression ([place])   . GERD (gastroesophageal reflux disease)   . Rheumatoid arthritis(714.0)     Family History  Problem Relation Age of Onset  . Multiple sclerosis Father   . Breast cancer Mother   . Breast cancer Sister   . Alcoholism Child        now sober    Social History   Socioeconomic History  . Marital status: Married    Spouse name: Not on file  . Number of children: Not on file  . Years of education: Not on file  . Highest education level: Not on file  Occupational History  . Not on file  Social Needs  . Financial resource strain: Not on file  . Food insecurity    Worry: Not on file    Inability: Not on file  . Transportation needs    Medical: Not on file    Non-medical: Not on file  Tobacco Use  . Smoking status: Former Smoker    Packs/day: 1.00    Years: 20.00    Pack years: 20.00    Types: Cigarettes    Quit date: 10/08/1998    Years since quitting: 20.6  . Smokeless tobacco: Former Systems developer    Types: Snuff, Sarina Ser    Quit date: 1988  Substance and Sexual Activity  . Alcohol use: Yes    Comment: occ  . Drug use: No  . Sexual activity: Not on file  Lifestyle  . Physical activity    Days per week: Not on file    Minutes per session: Not on file  . Stress: Not on file  Relationships  . Social Herbalist on phone: Not on file    Gets together: Not on file    Attends religious service: Not on file    Active member of club or organization: Not on file    Attends meetings of clubs or  organizations: Not on file    Relationship status: Not on file  . Intimate partner violence    Fear of current or ex partner: Not on file    Emotionally abused: Not on file    Physically abused: Not on file    Forced sexual activity: Not on file  Other Topics Concern  . Not on file  Social History Narrative  . Not on file    Past Medical History, Surgical history, Social history, and Family history were reviewed and updated as appropriate.  Please see review of systems for further details on the patient's review from today.   Objective:   Physical Exam:  There were no vitals taken for this visit.  Physical Exam Constitutional:      General: He is not in acute distress.    Appearance: He is well-developed.  Musculoskeletal:        General: No deformity.  Neurological:     Mental Status: He is alert and oriented to person, place, and time.     Coordination: Coordination normal.  Psychiatric:        Attention and Perception: Attention and perception normal. He does not perceive auditory or visual hallucinations.        Mood and Affect: Mood normal. Mood is not anxious or depressed. Affect is not labile, blunt, angry or inappropriate.        Speech: Speech normal.        Behavior: Behavior normal.        Thought Content: Thought content normal. Thought content is not paranoid or delusional. Thought content does not include homicidal or suicidal ideation. Thought content does not include homicidal or suicidal plan.        Cognition and Memory: Cognition and memory normal.        Judgment: Judgment normal.     Comments: Insight intact     Lab Review:     Component Value Date/Time   NA 138 03/13/2019 1133   K 3.7 03/13/2019 1133   CL 109 03/13/2019 1133   CO2 21 (L) 03/13/2019 1133   GLUCOSE 122 (H) 03/13/2019 1133   BUN 13 03/13/2019 1133   CREATININE 1.17 03/13/2019 1133   CREATININE 1.05 07/19/2017 1407   CALCIUM 9.0 03/13/2019 1133   PROT 6.9 03/13/2019 1133    ALBUMIN 3.8 03/13/2019 1133   AST 31 03/13/2019 1133   ALT 29 03/13/2019 1133   ALKPHOS 60 03/13/2019 1133   BILITOT 0.6 03/13/2019 1133   GFRNONAA >60 03/13/2019 1133   GFRNONAA 75 07/19/2017 1407   GFRAA >60 03/13/2019 1133   GFRAA 87 07/19/2017 1407       Component Value Date/Time   WBC 5.1 03/13/2019 1133   RBC 4.13 (L) 03/13/2019 1133   HGB 13.0 03/13/2019 1133   HCT 40.3 03/13/2019 1133   PLT 280 03/13/2019 1133   MCV 97.6 03/13/2019 1133   MCH 31.5 03/13/2019 1133   MCHC 32.3 03/13/2019 1133   RDW 12.5 03/13/2019 1133   LYMPHSABS 1.9 02/13/2018 0951   MONOABS 1.0 02/13/2018 0951   EOSABS 0.1 02/13/2018 0951   BASOSABS 0.0 02/13/2018 0951    Lithium Lvl  Date Value Ref Range Status  05/23/2017 0.7 0.6 - 1.2 mmol/L Final    Comment:    ** Please note change in unit of measure and reference range(s). **     No results found for: PHENYTOIN, PHENOBARB, VALPROATE, CBMZ   .res Assessment: Plan:    Plan:  1. Decrease Abilify 10mg  to 7.5mg  tablet daily 2. Clonazepam 1mg  at hs   RTC 4 weeks  Patient advised to contact office with any questions, adverse effects, or acute worsening in signs and symptoms.  Discussed potential benefits, risk, and side effects of benzodiazepines to include potential risk of tolerance and dependence, as well as possible drowsiness.  Advised patient not to drive if experiencing drowsiness and to take lowest possible effective dose to minimize risk of dependence and tolerance.  Discussed potential metabolic side effects associated with atypical antipsychotics, as well  as potential risk for movement side effects. Advised pt to contact office if movement side effects occur.   There are no diagnoses linked to this encounter.   Please see After Visit Summary for patient specific instructions.  Future Appointments  Date Time Provider [place]  06/11/2019  9:00 AM Kaci Dillie, Berdie Ogren, NP [facility] None  09/21/2019  9:40 AM Ofilia Neas, PA-C [facility] None  10/30/2019  9:30 AM Deneise Lever, MD [facility] None    No orders of the defined types were placed in this encounter.   -------------------------------

## 2019-06-01 ENCOUNTER — Encounter (HOSPITAL_COMMUNITY): Payer: Medicare Other

## 2019-06-02 DIAGNOSIS — Z1211 Encounter for screening for malignant neoplasm of colon: Secondary | ICD-10-CM | POA: Diagnosis not present

## 2019-06-02 DIAGNOSIS — R197 Diarrhea, unspecified: Secondary | ICD-10-CM | POA: Diagnosis not present

## 2019-06-04 DIAGNOSIS — I10 Essential (primary) hypertension: Secondary | ICD-10-CM | POA: Diagnosis not present

## 2019-06-04 DIAGNOSIS — Z1389 Encounter for screening for other disorder: Secondary | ICD-10-CM | POA: Diagnosis not present

## 2019-06-04 DIAGNOSIS — F319 Bipolar disorder, unspecified: Secondary | ICD-10-CM | POA: Diagnosis not present

## 2019-06-04 DIAGNOSIS — K219 Gastro-esophageal reflux disease without esophagitis: Secondary | ICD-10-CM | POA: Diagnosis not present

## 2019-06-04 DIAGNOSIS — M069 Rheumatoid arthritis, unspecified: Secondary | ICD-10-CM | POA: Diagnosis not present

## 2019-06-04 DIAGNOSIS — Z0000 Encounter for general adult medical examination without abnormal findings: Secondary | ICD-10-CM | POA: Diagnosis not present

## 2019-06-04 DIAGNOSIS — K529 Noninfective gastroenteritis and colitis, unspecified: Secondary | ICD-10-CM | POA: Diagnosis not present

## 2019-06-04 DIAGNOSIS — J4520 Mild intermittent asthma, uncomplicated: Secondary | ICD-10-CM | POA: Diagnosis not present

## 2019-06-04 DIAGNOSIS — E785 Hyperlipidemia, unspecified: Secondary | ICD-10-CM | POA: Diagnosis not present

## 2019-06-04 DIAGNOSIS — Z125 Encounter for screening for malignant neoplasm of prostate: Secondary | ICD-10-CM | POA: Diagnosis not present

## 2019-06-09 DIAGNOSIS — D123 Benign neoplasm of transverse colon: Secondary | ICD-10-CM | POA: Diagnosis not present

## 2019-06-09 DIAGNOSIS — Z1211 Encounter for screening for malignant neoplasm of colon: Secondary | ICD-10-CM | POA: Diagnosis not present

## 2019-06-09 DIAGNOSIS — K5730 Diverticulosis of large intestine without perforation or abscess without bleeding: Secondary | ICD-10-CM | POA: Diagnosis not present

## 2019-06-11 ENCOUNTER — Ambulatory Visit (INDEPENDENT_AMBULATORY_CARE_PROVIDER_SITE_OTHER): Payer: Medicare Other | Admitting: Adult Health

## 2019-06-11 ENCOUNTER — Other Ambulatory Visit: Payer: Self-pay

## 2019-06-11 ENCOUNTER — Encounter: Payer: Self-pay | Admitting: Adult Health

## 2019-06-11 DIAGNOSIS — G4700 Insomnia, unspecified: Secondary | ICD-10-CM | POA: Diagnosis not present

## 2019-06-11 DIAGNOSIS — F331 Major depressive disorder, recurrent, moderate: Secondary | ICD-10-CM

## 2019-06-11 DIAGNOSIS — F411 Generalized anxiety disorder: Secondary | ICD-10-CM | POA: Diagnosis not present

## 2019-06-11 DIAGNOSIS — F39 Unspecified mood [affective] disorder: Secondary | ICD-10-CM | POA: Diagnosis not present

## 2019-06-11 MED ORDER — BENZTROPINE MESYLATE 0.5 MG PO TABS: ORAL_TABLET | ORAL | 2 refills | Status: DC

## 2019-06-11 MED ORDER — CLONAZEPAM 1 MG PO TABS: 1.0000 mg | ORAL_TABLET | Freq: Every day | ORAL | 2 refills | Status: DC

## 2019-06-11 MED ORDER — ARIPIPRAZOLE 10 MG PO TABS: ORAL_TABLET | ORAL | 5 refills | Status: DC

## 2019-06-11 NOTE — Progress Notes (Signed)
[name] [name] [id number] Aug 25, 1951 68 y.o.  Subjective:   Patient ID:  [name] [name] is a 68 y.o. (DOB 12-09-1950) male.  Chief Complaint: No chief complaint on file.   HPI [name] [name] presents to the office today for follow-up of Anxiety, Depression, Mood disorder, and Insomnia  Describes mood today as "ok". Pleasant. Mood symptoms - denies depression, anxiety, and irritability. Stating "I'm doing ok emotionally". Reports going back up on the Abilify - 7.5mg  to 10mg  at bedtime. Started having increased "irritability - gone now". Now reports feeling "internally restless". Stating "I don't like feeling irritaable". Stable interest and motivation. Taking medications as prescribed.  Energy levels stable. Active, has a regular exercise routine. Retired.   Enjoys some usual interests and activities. Lives alone. Spending time with dog "Minnie". Walking most days. Mostly staying at home.  Appetite adequate. Weight stable - 190. Sleeps well most nights. Averages 8 to 10 hours. Focus and concentration stable. Completing tasks. Managing aspects of household.  Denies SI or HI. Denies AH or VH.  Review of Systems:  Review of Systems  Musculoskeletal: Negative for gait problem.  Neurological: Negative for tremors.  Psychiatric/Behavioral:       Please refer to HPI    Medications: I have reviewed the patient's current medications.  Current Outpatient Medications  Medication Sig Dispense Refill  . Abatacept (ORENCIA IV) Inject into the vein every 30 (thirty) days.    Marland Kitchen acetaminophen (TYLENOL) 325 MG tablet Take 650 mg by mouth every 6 (six) hours as needed.    Marland Kitchen albuterol (PROAIR HFA) 108 (90 Base) MCG/ACT inhaler INHALE TWO PUFFS BY MOUTH EVERY 4 HOURS (Patient not taking: Reported on 04/20/2019) 8.5 each 12  . albuterol (PROAIR HFA) 108 (90 Base) MCG/ACT inhaler Inhale 1-2 puffs into the lungs every 6 (six) hours as needed for wheezing or shortness of breath. (Patient not taking:  Reported on 04/20/2019) 1 Inhaler 5  . amoxicillin-clavulanate (AUGMENTIN) 875-125 MG tablet Take 1 tablet by mouth 2 (two) times daily. (Patient not taking: Reported on 04/20/2019) 20 tablet 0  . amoxicillin-clavulanate (AUGMENTIN) 875-125 MG tablet Take 1 tablet by mouth 2 (two) times daily. (Patient not taking: Reported on 04/20/2019) 20 tablet 0  . ARIPiprazole (ABILIFY) 10 MG tablet Take one tablet at bedtime. 30 tablet 5  . Ascorbic Acid (VITAMIN C PO) Take by mouth daily.    . benztropine (COGENTIN) 0.5 MG tablet Take one tablet at bedtime. 30 tablet 2  . cholestyramine (QUESTRAN) 4 GM/DOSE powder     . clonazePAM (KLONOPIN) 1 MG tablet Take 1 tablet (1 mg total) by mouth at bedtime. 30 tablet 2  . fluticasone (FLONASE) 50 MCG/ACT nasal spray Place 1 spray into both nostrils daily. 16 g 11  . Fluticasone-Umeclidin-Vilant (TRELEGY ELLIPTA) 100-62.5-25 MCG/INH AEPB Inhale 1 puff into the lungs daily. (Patient not taking: Reported on 04/20/2019) 60 each 7  . guaiFENesin (MUCUS RELIEF ADULT PO) Take by mouth daily.    Marland Kitchen lithium carbonate 300 MG capsule Take 300 mg by mouth once.     . loratadine (CLARITIN) 10 MG tablet Take 10 mg by mouth daily.    Marland Kitchen losartan-hydrochlorothiazide (HYZAAR) 100-25 MG tablet Take 1 tablet by mouth daily.     . montelukast (SINGULAIR) 10 MG tablet Take 1 tablet (10 mg total) by mouth daily. (Patient not taking: Reported on 04/20/2019) 90 tablet 3  . Multiple Vitamins-Minerals (MULTIVITAMIN WITH MINERALS) tablet Take by mouth.    . Multiple Vitamins-Minerals (ZINC PO) Take  by mouth.    . nystatin (MYCOSTATIN) 100000 UNIT/ML suspension Take 5 mLs by mouth daily.     . Omeprazole (PRILOSEC PO) Take by mouth 2 (two) times daily.    . Probiotic Product (PROBIOTIC PO) Take by mouth.    . sildenafil (VIAGRA) 100 MG tablet Take 100 mg by mouth daily as needed.      . Testosterone 20.25 MG/ACT (1.62%) GEL Apply 2 pumps as directed once a day     No current  facility-administered medications for this visit.     Medication Side Effects: None  Allergies:  Allergies  Allergen Reactions  . Simponi [Golimumab]     Repeated infections  . Aspirin     REACTION: throat swelling  . Other Other (See Comments)    REACTION: oral sores  . Sulfonamide Derivatives     REACTION: oral sores  . Theophyllines     Past Medical History:  Diagnosis Date  . ALLERGIC RHINITIS   . Asthma   . Bipolar depression ([place])   . GERD (gastroesophageal reflux disease)   . Rheumatoid arthritis(714.0)     Family History  Problem Relation Age of Onset  . Multiple sclerosis Father   . Breast cancer Mother   . Breast cancer Sister   . Alcoholism Child        now sober    Social History   Socioeconomic History  . Marital status: Married    Spouse name: Not on file  . Number of children: Not on file  . Years of education: Not on file  . Highest education level: Not on file  Occupational History  . Not on file  Social Needs  . Financial resource strain: Not on file  . Food insecurity    Worry: Not on file    Inability: Not on file  . Transportation needs    Medical: Not on file    Non-medical: Not on file  Tobacco Use  . Smoking status: Former Smoker    Packs/day: 1.00    Years: 20.00    Pack years: 20.00    Types: Cigarettes    Quit date: 10/08/1998    Years since quitting: 20.6  . Smokeless tobacco: Former Systems developer    Types: Snuff, Sarina Ser    Quit date: 1988  Substance and Sexual Activity  . Alcohol use: Yes    Comment: occ  . Drug use: No  . Sexual activity: Not on file  Lifestyle  . Physical activity    Days per week: Not on file    Minutes per session: Not on file  . Stress: Not on file  Relationships  . Social Herbalist on phone: Not on file    Gets together: Not on file    Attends religious service: Not on file    Active member of club or organization: Not on file    Attends meetings of clubs or organizations: Not on file     Relationship status: Not on file  . Intimate partner violence    Fear of current or ex partner: Not on file    Emotionally abused: Not on file    Physically abused: Not on file    Forced sexual activity: Not on file  Other Topics Concern  . Not on file  Social History Narrative  . Not on file    Past Medical History, Surgical history, Social history, and Family history were reviewed and updated as appropriate.   Please see review of  systems for further details on the patient's review from today.   Objective:   Physical Exam:  There were no vitals taken for this visit.  Physical Exam Constitutional:      General: He is not in acute distress.    Appearance: He is well-developed.  Musculoskeletal:        General: No deformity.  Neurological:     Mental Status: He is alert and oriented to person, place, and time.     Coordination: Coordination normal.  Psychiatric:        Attention and Perception: Attention and perception normal. He does not perceive auditory or visual hallucinations.        Mood and Affect: Mood normal. Mood is not anxious or depressed. Affect is not labile, blunt, angry or inappropriate.        Speech: Speech normal.        Behavior: Behavior normal.        Thought Content: Thought content normal. Thought content is not paranoid or delusional. Thought content does not include homicidal or suicidal ideation. Thought content does not include homicidal or suicidal plan.        Cognition and Memory: Cognition and memory normal.        Judgment: Judgment normal.     Comments: Insight intact     Lab Review:     Component Value Date/Time   NA 138 03/13/2019 1133   K 3.7 03/13/2019 1133   CL 109 03/13/2019 1133   CO2 21 (L) 03/13/2019 1133   GLUCOSE 122 (H) 03/13/2019 1133   BUN 13 03/13/2019 1133   CREATININE 1.17 03/13/2019 1133   CREATININE 1.05 07/19/2017 1407   CALCIUM 9.0 03/13/2019 1133   PROT 6.9 03/13/2019 1133   ALBUMIN 3.8 03/13/2019 1133    AST 31 03/13/2019 1133   ALT 29 03/13/2019 1133   ALKPHOS 60 03/13/2019 1133   BILITOT 0.6 03/13/2019 1133   GFRNONAA >60 03/13/2019 1133   GFRNONAA 75 07/19/2017 1407   GFRAA >60 03/13/2019 1133   GFRAA 87 07/19/2017 1407       Component Value Date/Time   WBC 5.1 03/13/2019 1133   RBC 4.13 (L) 03/13/2019 1133   HGB 13.0 03/13/2019 1133   HCT 40.3 03/13/2019 1133   PLT 280 03/13/2019 1133   MCV 97.6 03/13/2019 1133   MCH 31.5 03/13/2019 1133   MCHC 32.3 03/13/2019 1133   RDW 12.5 03/13/2019 1133   LYMPHSABS 1.9 02/13/2018 0951   MONOABS 1.0 02/13/2018 0951   EOSABS 0.1 02/13/2018 0951   BASOSABS 0.0 02/13/2018 0951    Lithium Lvl  Date Value Ref Range Status  05/23/2017 0.7 0.6 - 1.2 mmol/L Final    Comment:    ** Please note change in unit of measure and reference range(s). **     No results found for: PHENYTOIN, PHENOBARB, VALPROATE, CBMZ   .res Assessment: Plan:    Plan:  Increase Abilify 7.5 mg to 10 mg tablet daily - has already increased  Clonazepam 1mg  in the morning Add Cogentin 0.5mg  at hs   RTC 4 weeks  Patient advised to contact office with any questions, adverse effects, or acute worsening in signs and symptoms.  Discussed potential benefits, risk, and side effects of benzodiazepines to include potential risk of tolerance and dependence, as well as possible drowsiness. Advised patient not to drive if experiencing drowsiness and to take lowest possible effective dose to minimize risk of dependence and tolerance.  Discussed potential metabolic side effects  associated with atypical antipsychotics, as well as potential risk for movement side effects. Advised pt to contact office if movement side effects occur.   Diagnoses and all orders for this visit:  Episodic mood disorder (HCC) -     ARIPiprazole (ABILIFY) 10 MG tablet; Take one tablet at bedtime. -     benztropine (COGENTIN) 0.5 MG tablet; Take one tablet at bedtime.  Insomnia, unspecified  type -     clonazePAM (KLONOPIN) 1 MG tablet; Take 1 tablet (1 mg total) by mouth at bedtime.  Major depressive disorder, recurrent episode, moderate (HCC) -     ARIPiprazole (ABILIFY) 10 MG tablet; Take one tablet at bedtime.  Generalized anxiety disorder -     ARIPiprazole (ABILIFY) 10 MG tablet; Take one tablet at bedtime. -     clonazePAM (KLONOPIN) 1 MG tablet; Take 1 tablet (1 mg total) by mouth at bedtime.     Please see After Visit Summary for patient specific instructions.  Future Appointments  Date Time Provider [place]  09/21/2019  9:40 AM Ofilia Neas, PA-C [facility] None  10/30/2019  9:30 AM Deneise Lever, MD [facility] None    No orders of the defined types were placed in this encounter.   -------------------------------

## 2019-06-12 DIAGNOSIS — D123 Benign neoplasm of transverse colon: Secondary | ICD-10-CM | POA: Diagnosis not present

## 2019-06-18 DIAGNOSIS — Z789 Other specified health status: Secondary | ICD-10-CM | POA: Diagnosis not present

## 2019-06-18 DIAGNOSIS — Z23 Encounter for immunization: Secondary | ICD-10-CM | POA: Diagnosis not present

## 2019-07-09 ENCOUNTER — Other Ambulatory Visit: Payer: Self-pay

## 2019-07-09 ENCOUNTER — Encounter: Payer: Self-pay | Admitting: Adult Health

## 2019-07-09 ENCOUNTER — Ambulatory Visit (INDEPENDENT_AMBULATORY_CARE_PROVIDER_SITE_OTHER): Payer: Medicare Other | Admitting: Adult Health

## 2019-07-09 DIAGNOSIS — F331 Major depressive disorder, recurrent, moderate: Secondary | ICD-10-CM

## 2019-07-09 DIAGNOSIS — F39 Unspecified mood [affective] disorder: Secondary | ICD-10-CM | POA: Diagnosis not present

## 2019-07-09 DIAGNOSIS — F411 Generalized anxiety disorder: Secondary | ICD-10-CM | POA: Diagnosis not present

## 2019-07-09 DIAGNOSIS — G4700 Insomnia, unspecified: Secondary | ICD-10-CM

## 2019-07-09 NOTE — Progress Notes (Signed)
[name] [name] [id number] Jul 11, 1951 68 y.o.   Virtual Visit via Telephone Note  I connected with pt on 06/18/19 at  3:30 PM EDT by telephone and verified that I am speaking with the correct person using two identifiers.   I discussed the limitations, risks, security and privacy concerns of performing an evaluation and management service by telephone and the availability of in person appointments. I also discussed with the patient that there may be a patient responsible charge related to this service. The patient expressed understanding and agreed to proceed.   I discussed the assessment and treatment plan with the patient. The patient was provided an opportunity to ask questions and all were answered. The patient agreed with the plan and demonstrated an understanding of the instructions.   The patient was advised to call back or seek an in-person evaluation if the symptoms worsen or if the condition fails to improve as anticipated.  I provided 30 minutes of non-face-to-face time during this encounter.  The patient was located at home.  The provider was located at [place].   Subjective:   Patient ID:  [name] [name] is a 68 y.o. (DOB May 16, 1951) male.  Chief Complaint: No chief complaint on file.   HPI [name] [name] presents to the office today for follow-up of Anxiety, Depression, Mood disorder, and Insomnia  Describes mood today as "ok". Pleasant. Mood symptoms - denies depression, anxiety, and irritability. Stating "I'm doing really well". Feels like "restlessness is all gone". Stating "I feel perfectly normal". Stable interest and motivation. Taking medications as prescribed.  Energy levels stable. Active, has a regular exercise routine. Walking most days. Has started going back to the gym. Retired.   Enjoys some usual interests and activities. Lives alone. Spending time with dog "Minnie". Getting out most days. Appetite adequate. Weight stable - 175. Sleeps  well most nights. Averages 8 to 10 hours. Focus and concentration stable. Completing tasks. Managing aspects of household.  Denies SI or HI. Denies AH or VH.  Review of Systems:  Review of Systems  Musculoskeletal: Negative for gait problem.  Neurological: Negative for tremors.  Psychiatric/Behavioral:       Please refer to HPI    Medications: I have reviewed the patient's current medications.  Current Outpatient Medications  Medication Sig Dispense Refill  . Abatacept (ORENCIA IV) Inject into the vein every 30 (thirty) days.    Marland Kitchen acetaminophen (TYLENOL) 325 MG tablet Take 650 mg by mouth every 6 (six) hours as needed.    Marland Kitchen albuterol (PROAIR HFA) 108 (90 Base) MCG/ACT inhaler INHALE TWO PUFFS BY MOUTH EVERY 4 HOURS (Patient not taking: Reported on 04/20/2019) 8.5 each 12  . albuterol (PROAIR HFA) 108 (90 Base) MCG/ACT inhaler Inhale 1-2 puffs into the lungs every 6 (six) hours as needed for wheezing or shortness of breath. (Patient not taking: Reported on 04/20/2019) 1 Inhaler 5  . amoxicillin-clavulanate (AUGMENTIN) 875-125 MG tablet Take 1 tablet by mouth 2 (two) times daily. (Patient not taking: Reported on 04/20/2019) 20 tablet 0  . amoxicillin-clavulanate (AUGMENTIN) 875-125 MG tablet Take 1 tablet by mouth 2 (two) times daily. (Patient not taking: Reported on 04/20/2019) 20 tablet 0  . ARIPiprazole (ABILIFY) 10 MG tablet Take one tablet at bedtime. 30 tablet 5  . Ascorbic Acid (VITAMIN C PO) Take by mouth daily.    . benztropine (COGENTIN) 0.5 MG tablet Take one tablet at bedtime. 30 tablet 2  . cholestyramine (QUESTRAN) 4 GM/DOSE powder     .  clonazePAM (KLONOPIN) 1 MG tablet Take 1 tablet (1 mg total) by mouth at bedtime. 30 tablet 2  . fluticasone (FLONASE) 50 MCG/ACT nasal spray Place 1 spray into both nostrils daily. 16 g 11  . Fluticasone-Umeclidin-Vilant (TRELEGY ELLIPTA) 100-62.5-25 MCG/INH AEPB Inhale 1 puff into the lungs daily. (Patient not taking: Reported on 04/20/2019) 60  each 7  . guaiFENesin (MUCUS RELIEF ADULT PO) Take by mouth daily.    Marland Kitchen lithium carbonate 300 MG capsule Take 300 mg by mouth once.     . loratadine (CLARITIN) 10 MG tablet Take 10 mg by mouth daily.    Marland Kitchen losartan-hydrochlorothiazide (HYZAAR) 100-25 MG tablet Take 1 tablet by mouth daily.     . montelukast (SINGULAIR) 10 MG tablet Take 1 tablet (10 mg total) by mouth daily. (Patient not taking: Reported on 04/20/2019) 90 tablet 3  . Multiple Vitamins-Minerals (MULTIVITAMIN WITH MINERALS) tablet Take by mouth.    . Multiple Vitamins-Minerals (ZINC PO) Take by mouth.    . nystatin (MYCOSTATIN) 100000 UNIT/ML suspension Take 5 mLs by mouth daily.     . Omeprazole (PRILOSEC PO) Take by mouth 2 (two) times daily.    . Probiotic Product (PROBIOTIC PO) Take by mouth.    . sildenafil (VIAGRA) 100 MG tablet Take 100 mg by mouth daily as needed.      . Testosterone 20.25 MG/ACT (1.62%) GEL Apply 2 pumps as directed once a day     No current facility-administered medications for this visit.     Medication Side Effects: None  Allergies:  Allergies  Allergen Reactions  . Simponi [Golimumab]     Repeated infections  . Aspirin     REACTION: throat swelling  . Other Other (See Comments)    REACTION: oral sores  . Sulfonamide Derivatives     REACTION: oral sores  . Theophyllines     Past Medical History:  Diagnosis Date  . ALLERGIC RHINITIS   . Asthma   . Bipolar depression ([place])   . GERD (gastroesophageal reflux disease)   . Rheumatoid arthritis(714.0)     Family History  Problem Relation Age of Onset  . Multiple sclerosis Father   . Breast cancer Mother   . Breast cancer Sister   . Alcoholism Child        now sober    Social History   Socioeconomic History  . Marital status: Married    Spouse name: Not on file  . Number of children: Not on file  . Years of education: Not on file  . Highest education level: Not on file  Occupational History  . Not on file  Social Needs  .  Financial resource strain: Not on file  . Food insecurity    Worry: Not on file    Inability: Not on file  . Transportation needs    Medical: Not on file    Non-medical: Not on file  Tobacco Use  . Smoking status: Former Smoker    Packs/day: 1.00    Years: 20.00    Pack years: 20.00    Types: Cigarettes    Quit date: 10/08/1998    Years since quitting: 20.7  . Smokeless tobacco: Former Systems developer    Types: Snuff, Sarina Ser    Quit date: 1988  Substance and Sexual Activity  . Alcohol use: Yes    Comment: occ  . Drug use: No  . Sexual activity: Not on file  Lifestyle  . Physical activity    Days per week: Not on  file    Minutes per session: Not on file  . Stress: Not on file  Relationships  . Social Herbalist on phone: Not on file    Gets together: Not on file    Attends religious service: Not on file    Active member of club or organization: Not on file    Attends meetings of clubs or organizations: Not on file    Relationship status: Not on file  . Intimate partner violence    Fear of current or ex partner: Not on file    Emotionally abused: Not on file    Physically abused: Not on file    Forced sexual activity: Not on file  Other Topics Concern  . Not on file  Social History Narrative  . Not on file    Past Medical History, Surgical history, Social history, and Family history were reviewed and updated as appropriate.   Please see review of systems for further details on the patient's review from today.   Objective:   Physical Exam:  There were no vitals taken for this visit.  Physical Exam Constitutional:      General: He is not in acute distress.    Appearance: He is well-developed.  Musculoskeletal:        General: No deformity.  Neurological:     Mental Status: He is alert and oriented to person, place, and time.     Coordination: Coordination normal.  Psychiatric:        Attention and Perception: Attention and perception normal. He does not  perceive auditory or visual hallucinations.        Mood and Affect: Mood normal. Mood is not anxious or depressed. Affect is not labile, blunt, angry or inappropriate.        Speech: Speech normal.        Behavior: Behavior normal.        Thought Content: Thought content normal. Thought content is not paranoid or delusional. Thought content does not include homicidal or suicidal ideation. Thought content does not include homicidal or suicidal plan.        Cognition and Memory: Cognition and memory normal.        Judgment: Judgment normal.     Comments: Insight intact     Lab Review:     Component Value Date/Time   NA 138 03/13/2019 1133   K 3.7 03/13/2019 1133   CL 109 03/13/2019 1133   CO2 21 (L) 03/13/2019 1133   GLUCOSE 122 (H) 03/13/2019 1133   BUN 13 03/13/2019 1133   CREATININE 1.17 03/13/2019 1133   CREATININE 1.05 07/19/2017 1407   CALCIUM 9.0 03/13/2019 1133   PROT 6.9 03/13/2019 1133   ALBUMIN 3.8 03/13/2019 1133   AST 31 03/13/2019 1133   ALT 29 03/13/2019 1133   ALKPHOS 60 03/13/2019 1133   BILITOT 0.6 03/13/2019 1133   GFRNONAA >60 03/13/2019 1133   GFRNONAA 75 07/19/2017 1407   GFRAA >60 03/13/2019 1133   GFRAA 87 07/19/2017 1407       Component Value Date/Time   WBC 5.1 03/13/2019 1133   RBC 4.13 (L) 03/13/2019 1133   HGB 13.0 03/13/2019 1133   HCT 40.3 03/13/2019 1133   PLT 280 03/13/2019 1133   MCV 97.6 03/13/2019 1133   MCH 31.5 03/13/2019 1133   MCHC 32.3 03/13/2019 1133   RDW 12.5 03/13/2019 1133   LYMPHSABS 1.9 02/13/2018 0951   MONOABS 1.0 02/13/2018 0951   EOSABS 0.1 02/13/2018  0951   BASOSABS 0.0 02/13/2018 0951    Lithium Lvl  Date Value Ref Range Status  05/23/2017 0.7 0.6 - 1.2 mmol/L Final    Comment:    ** Please note change in unit of measure and reference range(s). **     No results found for: PHENYTOIN, PHENOBARB, VALPROATE, CBMZ   .res Assessment: Plan:    Plan:  Abilify 10 mg tablet daily  Clonazepam 1mg  in the  morning Cogentin 0.5mg  at hs   RTC 4 weeks  Patient advised to contact office with any questions, adverse effects, or acute worsening in signs and symptoms.  Discussed potential benefits, risk, and side effects of benzodiazepines to include potential risk of tolerance and dependence, as well as possible drowsiness. Advised patient not to drive if experiencing drowsiness and to take lowest possible effective dose to minimize risk of dependence and tolerance.  Discussed potential metabolic side effects associated with atypical antipsychotics, as well as potential risk for movement side effects. Advised pt to contact office if movement side effects occur.   Diagnoses and all orders for this visit:  Episodic mood disorder (HCC)  Insomnia, unspecified type  Generalized anxiety disorder  Major depressive disorder, recurrent episode, moderate ([place])     Please see After Visit Summary for patient specific instructions.  Future Appointments  Date Time Provider [place]  09/21/2019  9:40 AM Ofilia Neas, PA-C [facility] None  10/30/2019  9:30 AM Deneise Lever, MD [facility] None    No orders of the defined types were placed in this encounter.   -------------------------------

## 2019-07-14 ENCOUNTER — Telehealth: Payer: Self-pay | Admitting: Rheumatology

## 2019-07-14 NOTE — Telephone Encounter (Signed)
Patient left a voicemail stating he is finished with his antibiotics and is ready to set up his O[facility] infusions.  Patient requested a return call.

## 2019-07-15 NOTE — Telephone Encounter (Signed)
Attempted to contact the patient and left message for patient to call the office.  

## 2019-07-15 NOTE — Telephone Encounter (Signed)
Patient returned call to the office. Patient advised we will need clearance letter from PCP stating infection has cleared. Patient will have PCP send letter.

## 2019-07-21 ENCOUNTER — Telehealth: Payer: Self-pay | Admitting: Rheumatology

## 2019-07-21 NOTE — Telephone Encounter (Signed)
Patient called requesting a call back when you are available. In reference to Crystal.

## 2019-07-21 NOTE — Telephone Encounter (Signed)
Patient states he had dental surgery and was on antibiotics afterwards. Patient advised that we need a clearance letter from his dentist before he can have his infusion.  Patient will go see his dentist.

## 2019-07-28 NOTE — Progress Notes (Deleted)
Office Visit Note  Patient: [name] [name]             Date of Birth: [birth year]           MRN: [id number]             PCP: Wenda Low, MD Referring: Wenda Low, MD Visit Date: 08/11/2019 Occupation: @GUAROCC @  Subjective:  No chief complaint on file.   His Orencia has been on hold due to infection and dental surgery.  Orencia IV 750 mg every 28 days and last infusion on 05/04/2019.  Last TB gold negative on 08/12/2018.  Due for TB gold today and will monitor yearly.  Most recent CBC/CMP within normal limits on 03/13/2019.  Due for CBC/CMP today and will monitor every 3 months.    History of Present Illness: [name] [name] is a 68 y.o. male ***   Activities of Daily Living:  Patient reports morning stiffness for *** {minute/hour:19697}.   Patient {ACTIONS;DENIES/REPORTS:21021675::"Denies"} nocturnal pain.  Difficulty dressing/grooming: {ACTIONS;DENIES/REPORTS:21021675::"Denies"} Difficulty climbing stairs: {ACTIONS;DENIES/REPORTS:21021675::"Denies"} Difficulty getting out of chair: {ACTIONS;DENIES/REPORTS:21021675::"Denies"} Difficulty using hands for taps, buttons, cutlery, and/or writing: {ACTIONS;DENIES/REPORTS:21021675::"Denies"}  No Rheumatology ROS completed.   PMFS History:  Patient Active Problem List   Diagnosis Date Noted  . History of gastroesophageal reflux (GERD) 05/29/2018  . High risk medication use 05/29/2018  . History of IBS 05/29/2018  . History of depression 07/15/2017  . History of asthma 07/08/2017  . 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 Asthmatic bronchitis, mild intermittent, uncomplicated [id number]  . Asthmatic bronchitis with acute exacerbation 10/15/2016  . Herpes zoster 08/01/2013  . Rheumatoid arthritis of multiple sites without rheumatoid factor ([place]) 09/18/2010  . NASAL POLYP 12/24/2007  . Sinusitis, chronic 12/24/2007  . Seasonal and perennial allergic rhinitis 12/24/2007  . Allergic-infective asthma  12/24/2007  . G E R D 12/24/2007    Past Medical History:  Diagnosis Date  . ALLERGIC RHINITIS   . Asthma   . Bipolar depression ([place])   . GERD (gastroesophageal reflux disease)   . Rheumatoid arthritis(714.0)     Family History  Problem Relation Age of Onset  . Multiple sclerosis Father   . Breast cancer Mother   . Breast cancer Sister   . Alcoholism Child        now sober   Past Surgical History:  Procedure Laterality Date  . CARPAL TUNNEL RELEASE Left   . extensive sinus surgery with ablation of the frontal sinuses    . HERNIA REPAIR    . nasal polypectomies     Social History   Social History Narrative  . Not on file   Immunization History  Administered Date(s) Administered  . Influenza Split 07/08/2012, 07/06/2013, 07/08/2016  . Influenza, High Dose Seasonal PF 06/13/2018  . Influenza,inj,Quad PF,6+ Mos 07/15/2014, 06/19/2017  . Pneumococcal Polysaccharide-23 07/15/2014  . Zoster Recombinat (Shingrix) 05/23/2018, 08/21/2018     Objective: Vital Signs: There were no vitals taken for this visit.   Physical Exam   Musculoskeletal Exam: ***  CDAI Exam: CDAI Score: - Patient Global: -; Provider Global: - Swollen: -; Tender: - Joint Exam   No joint exam has been documented for this visit   There is currently no information documented on the homunculus. Go to the Rheumatology activity and complete the homunculus joint exam.  Investigation: No additional findings.  Imaging: No results found.  Recent Labs: Lab Results  Component Value Date   WBC 5.1 03/13/2019   HGB 13.0 03/13/2019   PLT 280  03/13/2019   NA 138 03/13/2019   K 3.7 03/13/2019   CL 109 03/13/2019   CO2 21 (L) 03/13/2019   GLUCOSE 122 (H) 03/13/2019   BUN 13 03/13/2019   CREATININE 1.17 03/13/2019   BILITOT 0.6 03/13/2019   ALKPHOS 60 03/13/2019   AST 31 03/13/2019   ALT 29 03/13/2019   PROT 6.9 03/13/2019   ALBUMIN 3.8 03/13/2019   CALCIUM 9.0 03/13/2019   GFRAA >60  03/13/2019   QFTBGOLD NEGATIVE 07/16/2017   QFTBGOLDPLUS Negative 08/12/2018    Speciality Comments: No specialty comments available.  Procedures:  No procedures performed Allergies: Simponi [golimumab], Aspirin, Other, Sulfonamide derivatives, and Theophyllines   Assessment / Plan:     Visit Diagnoses: No diagnosis found.  Orders: No orders of the defined types were placed in this encounter.  No orders of the defined types were placed in this encounter.   Face-to-face time spent with patient was *** minutes. Greater than 50% of time was spent in counseling and coordination of care.  Follow-Up Instructions: No follow-ups on file.   Earnestine Mealing, CMA  Note - This record has been created using Editor, commissioning.  Chart creation errors have been sought, but may not always  have been located. Such creation errors do not reflect on  the standard of medical care.

## 2019-08-06 ENCOUNTER — Other Ambulatory Visit: Payer: Self-pay

## 2019-08-06 ENCOUNTER — Ambulatory Visit (INDEPENDENT_AMBULATORY_CARE_PROVIDER_SITE_OTHER): Payer: Medicare Other | Admitting: Adult Health

## 2019-08-06 ENCOUNTER — Encounter: Payer: Self-pay | Admitting: Adult Health

## 2019-08-06 DIAGNOSIS — F331 Major depressive disorder, recurrent, moderate: Secondary | ICD-10-CM | POA: Diagnosis not present

## 2019-08-06 DIAGNOSIS — F411 Generalized anxiety disorder: Secondary | ICD-10-CM | POA: Diagnosis not present

## 2019-08-06 DIAGNOSIS — F39 Unspecified mood [affective] disorder: Secondary | ICD-10-CM

## 2019-08-06 DIAGNOSIS — G4700 Insomnia, unspecified: Secondary | ICD-10-CM | POA: Diagnosis not present

## 2019-08-06 MED ORDER — CLONAZEPAM 1 MG PO TABS: 1.0000 mg | ORAL_TABLET | Freq: Every day | ORAL | 2 refills | Status: DC

## 2019-08-06 NOTE — Progress Notes (Signed)
RAAM LINDY [id number] [birth year] 68 y.o.   Virtual Visit via Telephone Note  I connected with pt on 08/06/2019 at 10:20 AM EDT by telephone and verified that I am speaking with the correct person using two identifiers.   I discussed the limitations, risks, security and privacy concerns of performing an evaluation and management service by telephone and the availability of in person appointments. I also discussed with the patient that there may be a patient responsible charge related to this service. The patient expressed understanding and agreed to proceed.   I discussed the assessment and treatment plan with the patient. The patient was provided an opportunity to ask questions and all were answered. The patient agreed with the plan and demonstrated an understanding of the instructions.   The patient was advised to call back or seek an in-person evaluation if the symptoms worsen or if the condition fails to improve as anticipated.  I provided 30 minutes of non-face-to-face time during this encounter.  The patient was located at home.  The provider was located at [place].   Subjective:   Patient ID:  [name] [name] is a 68 y.o. (DOB [birth year]) male.  Chief Complaint: No chief complaint on file.   HPI [name] [name] presents to the office today for follow-up of Anxiety, Depression, Mood disorder, and Insomnia  Describes mood today as "ok". Pleasant. Mood symptoms - denies depression, anxiety, and irritability. Stating "I've ben doing great". Feels like medications are working well for him. Stating "I feel like a new person". Stable interest and motivation. Taking medications as prescribed.  Energy levels stable. Active, has a regular exercise routine. Walking. Going to the gym. Retired.   Enjoys some usual interests and activities. Lives alone. Spending time with dog "Minnie". Getting out most days. Appetite adequate. Weight stable. Sleeps well most nights.  Averages 8 to 10 hours. Focus and concentration stable. Completing tasks. Managing aspects of household.  Denies SI or HI. Denies AH or VH.  Review of Systems:  Review of Systems  Musculoskeletal: Negative for gait problem.  Neurological: Negative for tremors.  Psychiatric/Behavioral:       Please refer to HPI    Medications: I have reviewed the patient's current medications.  Current Outpatient Medications  Medication Sig Dispense Refill  . Abatacept (ORENCIA IV) Inject into the vein every 30 (thirty) days.    Marland Kitchen acetaminophen (TYLENOL) 325 MG tablet Take 650 mg by mouth every 6 (six) hours as needed.    Marland Kitchen albuterol (PROAIR HFA) 108 (90 Base) MCG/ACT inhaler INHALE TWO PUFFS BY MOUTH EVERY 4 HOURS (Patient not taking: Reported on 04/20/2019) 8.5 each 12  . albuterol (PROAIR HFA) 108 (90 Base) MCG/ACT inhaler Inhale 1-2 puffs into the lungs every 6 (six) hours as needed for wheezing or shortness of breath. (Patient not taking: Reported on 04/20/2019) 1 Inhaler 5  . amoxicillin-clavulanate (AUGMENTIN) 875-125 MG tablet Take 1 tablet by mouth 2 (two) times daily. (Patient not taking: Reported on 04/20/2019) 20 tablet 0  . amoxicillin-clavulanate (AUGMENTIN) 875-125 MG tablet Take 1 tablet by mouth 2 (two) times daily. (Patient not taking: Reported on 04/20/2019) 20 tablet 0  . ARIPiprazole (ABILIFY) 10 MG tablet Take one tablet at bedtime. 30 tablet 5  . Ascorbic Acid (VITAMIN C PO) Take by mouth daily.    . benztropine (COGENTIN) 0.5 MG tablet Take one tablet at bedtime. 30 tablet 2  . cholestyramine (QUESTRAN) 4 GM/DOSE powder     . clonazePAM (KLONOPIN) 1 MG  tablet Take 1 tablet (1 mg total) by mouth at bedtime. 30 tablet 2  . fluticasone (FLONASE) 50 MCG/ACT nasal spray Place 1 spray into both nostrils daily. 16 g 11  . Fluticasone-Umeclidin-Vilant (TRELEGY ELLIPTA) 100-62.5-25 MCG/INH AEPB Inhale 1 puff into the lungs daily. (Patient not taking: Reported on 04/20/2019) 60 each 7  .  guaiFENesin (MUCUS RELIEF ADULT PO) Take by mouth daily.    Marland Kitchen lithium carbonate 300 MG capsule Take 300 mg by mouth once.     . loratadine (CLARITIN) 10 MG tablet Take 10 mg by mouth daily.    Marland Kitchen losartan-hydrochlorothiazide (HYZAAR) 100-25 MG tablet Take 1 tablet by mouth daily.     . montelukast (SINGULAIR) 10 MG tablet Take 1 tablet (10 mg total) by mouth daily. (Patient not taking: Reported on 04/20/2019) 90 tablet 3  . Multiple Vitamins-Minerals (MULTIVITAMIN WITH MINERALS) tablet Take by mouth.    . Multiple Vitamins-Minerals (ZINC PO) Take by mouth.    . nystatin (MYCOSTATIN) 100000 UNIT/ML suspension Take 5 mLs by mouth daily.     . Omeprazole (PRILOSEC PO) Take by mouth 2 (two) times daily.    . Probiotic Product (PROBIOTIC PO) Take by mouth.    . sildenafil (VIAGRA) 100 MG tablet Take 100 mg by mouth daily as needed.      . Testosterone 20.25 MG/ACT (1.62%) GEL Apply 2 pumps as directed once a day     No current facility-administered medications for this visit.     Medication Side Effects: None  Allergies:  Allergies  Allergen Reactions  . Simponi [Golimumab]     Repeated infections  . Aspirin     REACTION: throat swelling  . Other Other (See Comments)    REACTION: oral sores  . Sulfonamide Derivatives     REACTION: oral sores  . Theophyllines     Past Medical History:  Diagnosis Date  . ALLERGIC RHINITIS   . Asthma   . Bipolar depression ([place])   . GERD (gastroesophageal reflux disease)   . Rheumatoid arthritis(714.0)     Family History  Problem Relation Age of Onset  . Multiple sclerosis Father   . Breast cancer Mother   . Breast cancer Sister   . Alcoholism Child        now sober    Social History   Socioeconomic History  . Marital status: Married    Spouse name: Not on file  . Number of children: Not on file  . Years of education: Not on file  . Highest education level: Not on file  Occupational History  . Not on file  Social Needs  . Financial  resource strain: Not on file  . Food insecurity    Worry: Not on file    Inability: Not on file  . Transportation needs    Medical: Not on file    Non-medical: Not on file  Tobacco Use  . Smoking status: Former Smoker    Packs/day: 1.00    Years: 20.00    Pack years: 20.00    Types: Cigarettes    Quit date: 10/08/1998    Years since quitting: 20.8  . Smokeless tobacco: Former Systems developer    Types: Snuff, Sarina Ser    Quit date: 1988  Substance and Sexual Activity  . Alcohol use: Yes    Comment: occ  . Drug use: No  . Sexual activity: Not on file  Lifestyle  . Physical activity    Days per week: Not on file  Minutes per session: Not on file  . Stress: Not on file  Relationships  . Social Herbalist on phone: Not on file    Gets together: Not on file    Attends religious service: Not on file    Active member of club or organization: Not on file    Attends meetings of clubs or organizations: Not on file    Relationship status: Not on file  . Intimate partner violence    Fear of current or ex partner: Not on file    Emotionally abused: Not on file    Physically abused: Not on file    Forced sexual activity: Not on file  Other Topics Concern  . Not on file  Social History Narrative  . Not on file    Past Medical History, Surgical history, Social history, and Family history were reviewed and updated as appropriate.   Please see review of systems for further details on the patient's review from today.   Objective:   Physical Exam:  There were no vitals taken for this visit.  Physical Exam Constitutional:      General: He is not in acute distress.    Appearance: He is well-developed.  Musculoskeletal:        General: No deformity.  Neurological:     Mental Status: He is alert and oriented to person, place, and time.     Cranial Nerves: No dysarthria.     Coordination: Coordination normal.  Psychiatric:        Attention and Perception: Attention and perception  normal. He does not perceive auditory or visual hallucinations.        Mood and Affect: Mood normal. Mood is not anxious or depressed. Affect is not labile, blunt, angry or inappropriate.        Speech: Speech normal.        Behavior: Behavior normal. Behavior is cooperative.        Thought Content: Thought content normal. Thought content is not paranoid or delusional. Thought content does not include homicidal or suicidal ideation. Thought content does not include homicidal or suicidal plan.        Cognition and Memory: Cognition and memory normal.        Judgment: Judgment normal.     Comments: Insight intact     Lab Review:     Component Value Date/Time   NA 138 03/13/2019 1133   K 3.7 03/13/2019 1133   CL 109 03/13/2019 1133   CO2 21 (L) 03/13/2019 1133   GLUCOSE 122 (H) 03/13/2019 1133   BUN 13 03/13/2019 1133   CREATININE 1.17 03/13/2019 1133   CREATININE 1.05 07/19/2017 1407   CALCIUM 9.0 03/13/2019 1133   PROT 6.9 03/13/2019 1133   ALBUMIN 3.8 03/13/2019 1133   AST 31 03/13/2019 1133   ALT 29 03/13/2019 1133   ALKPHOS 60 03/13/2019 1133   BILITOT 0.6 03/13/2019 1133   GFRNONAA >60 03/13/2019 1133   GFRNONAA 75 07/19/2017 1407   GFRAA >60 03/13/2019 1133   GFRAA 87 07/19/2017 1407       Component Value Date/Time   WBC 5.1 03/13/2019 1133   RBC 4.13 (L) 03/13/2019 1133   HGB 13.0 03/13/2019 1133   HCT 40.3 03/13/2019 1133   PLT 280 03/13/2019 1133   MCV 97.6 03/13/2019 1133   MCH 31.5 03/13/2019 1133   MCHC 32.3 03/13/2019 1133   RDW 12.5 03/13/2019 1133   LYMPHSABS 1.9 02/13/2018 0951   MONOABS 1.0  02/13/2018 0951   EOSABS 0.1 02/13/2018 0951   BASOSABS 0.0 02/13/2018 0951    Lithium Lvl  Date Value Ref Range Status  05/23/2017 0.7 0.6 - 1.2 mmol/L Final    Comment:    ** Please note change in unit of measure and reference range(s). **     No results found for: PHENYTOIN, PHENOBARB, VALPROATE, CBMZ   .res Assessment: Plan:    Plan:  Abilify 10  mg tablet daily  Clonazepam 1mg  in the morning Cogentin 0.5mg  at hs   RTC 4 weeks  Patient advised to contact office with any questions, adverse effects, or acute worsening in signs and symptoms.  Discussed potential benefits, risk, and side effects of benzodiazepines to include potential risk of tolerance and dependence, as well as possible drowsiness. Advised patient not to drive if experiencing drowsiness and to take lowest possible effective dose to minimize risk of dependence and tolerance.  Discussed potential metabolic side effects associated with atypical antipsychotics, as well as potential risk for movement side effects. Advised pt to contact office if movement side effects occur.   Diagnoses and all orders for this visit:  Episodic mood disorder ([place])  Insomnia, unspecified type -     clonazePAM (KLONOPIN) 1 MG tablet; Take 1 tablet (1 mg total) by mouth at bedtime.  Generalized anxiety disorder -     clonazePAM (KLONOPIN) 1 MG tablet; Take 1 tablet (1 mg total) by mouth at bedtime.  Major depressive disorder, recurrent episode, moderate ([place])     Please see After Visit Summary for patient specific instructions.  Future Appointments  Date Time Provider [place]  08/11/2019  9:20 AM Ofilia Neas, PA-C [facility] None  09/21/2019  9:40 AM Ofilia Neas, PA-C [facility] None  10/30/2019  9:30 AM Deneise Lever, MD [facility] None    No orders of the defined types were placed in this encounter.   -------------------------------

## 2019-08-11 ENCOUNTER — Telehealth: Payer: Self-pay | Admitting: *Deleted

## 2019-08-11 ENCOUNTER — Ambulatory Visit: Payer: Medicare Other | Admitting: Physician Assistant

## 2019-08-11 DIAGNOSIS — M069 Rheumatoid arthritis, unspecified: Secondary | ICD-10-CM | POA: Diagnosis not present

## 2019-08-11 NOTE — Telephone Encounter (Signed)
Please call Dr. Myrle Sheng office and get verbal clearance to restart patient on the biologic DMARDs.

## 2019-08-11 NOTE — Telephone Encounter (Signed)
Patient states he was seen by Dr. Lysle Rubens today. Patient the doctor advised him his infection has cleared. Patient is no longer on antibiotics. Patient would like to schedule his infusion. Patient isn't sure that the doctor will send a clearance letter. Please advise.

## 2019-08-12 ENCOUNTER — Ambulatory Visit (INDEPENDENT_AMBULATORY_CARE_PROVIDER_SITE_OTHER): Payer: Medicare Other | Admitting: Physician Assistant

## 2019-08-12 ENCOUNTER — Other Ambulatory Visit: Payer: Self-pay

## 2019-08-12 ENCOUNTER — Encounter: Payer: Self-pay | Admitting: Physician Assistant

## 2019-08-12 VITALS — BP 111/73 | HR 57 | Resp 16 | Ht 68.0 in | Wt 192.8 lb

## 2019-08-12 DIAGNOSIS — M0609 Rheumatoid arthritis without rheumatoid factor, multiple sites: Secondary | ICD-10-CM | POA: Diagnosis not present

## 2019-08-12 DIAGNOSIS — G252 Other specified forms of tremor: Secondary | ICD-10-CM

## 2019-08-12 DIAGNOSIS — M19041 Primary osteoarthritis, right hand: Secondary | ICD-10-CM | POA: Diagnosis not present

## 2019-08-12 DIAGNOSIS — Z8709 Personal history of other diseases of the respiratory system: Secondary | ICD-10-CM

## 2019-08-12 DIAGNOSIS — Z820 Family history of epilepsy and other diseases of the nervous system: Secondary | ICD-10-CM | POA: Diagnosis not present

## 2019-08-12 DIAGNOSIS — Z8659 Personal history of other mental and behavioral disorders: Secondary | ICD-10-CM | POA: Diagnosis not present

## 2019-08-12 DIAGNOSIS — Z79899 Other long term (current) drug therapy: Secondary | ICD-10-CM

## 2019-08-12 DIAGNOSIS — M19042 Primary osteoarthritis, left hand: Secondary | ICD-10-CM

## 2019-08-12 DIAGNOSIS — Z8719 Personal history of other diseases of the digestive system: Secondary | ICD-10-CM

## 2019-08-12 NOTE — Progress Notes (Signed)
Office Visit Note  Patient: [name] [name]             Date of Birth: Sep 03, 1951           MRN: [id number]             PCP: Wenda Low, MD Referring: Wenda Low, MD Visit Date: 08/12/2019 Occupation: @GUAROCC @  Subjective:  Discuss medications   History of Present Illness: [name] [name] is a 68 y.o. male with history of seronegative rheumatoid arthritis and osteoarthritis. He has been off of Orencia for 2 months due to having an invasive dental procedure and an infection.  He was treated with antibiotics and held Orencia infusions during that time.  He completed antibiotics 10 days ago and has been cleared to restart on Orencia.  He denies any recent rheumatoid arthritis flares. He reports he is having pain in both knee joints and both hip joints. He denies any other joint pain or joint swelling.   Activities of Daily Living:  Patient reports morning stiffness for 10   minutes.   Patient Denies nocturnal pain.  Difficulty dressing/grooming: Reports Difficulty climbing stairs: Denies Difficulty getting out of chair: Reports Difficulty using hands for taps, buttons, cutlery, and/or writing: Reports  Review of Systems  Constitutional: Negative for fatigue.  HENT: Positive for mouth dryness.   Eyes: Positive for dryness.  Respiratory: Negative for shortness of breath.   Cardiovascular: Negative for swelling in legs/feet.  Gastrointestinal: Negative for constipation.  Endocrine: Positive for excessive thirst.  Genitourinary: Negative for painful urination.  Musculoskeletal: Positive for arthralgias, gait problem, joint pain, muscle weakness and morning stiffness.  Skin: Positive for rash.  Allergic/Immunologic: Positive for susceptible to infections.  Neurological: Positive for weakness.  Hematological: Negative for bruising/bleeding tendency.  Psychiatric/Behavioral: Negative for sleep disturbance.    PMFS History:  Patient Active Problem List   Diagnosis Date  Noted  . History of gastroesophageal reflux (GERD) 05/29/2018  . High risk medication use 05/29/2018  . History of IBS 05/29/2018  . History of depression 07/15/2017  . History of asthma 07/08/2017  . 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 Asthmatic bronchitis, mild intermittent, uncomplicated [id number]  . Asthmatic bronchitis with acute exacerbation 10/15/2016  . Herpes zoster 08/01/2013  . Rheumatoid arthritis of multiple sites without rheumatoid factor ([place]) 09/18/2010  . NASAL POLYP 12/24/2007  . Sinusitis, chronic 12/24/2007  . Seasonal and perennial allergic rhinitis 12/24/2007  . Allergic-infective asthma 12/24/2007  . G E R D 12/24/2007    Past Medical History:  Diagnosis Date  . ALLERGIC RHINITIS   . Asthma   . Bipolar depression ([place])   . GERD (gastroesophageal reflux disease)   . Rheumatoid arthritis(714.0)     Family History  Problem Relation Age of Onset  . Multiple sclerosis Father   . Breast cancer Mother   . Breast cancer Sister   . Alcoholism Child        now sober   Past Surgical History:  Procedure Laterality Date  . CARPAL TUNNEL RELEASE Left   . extensive sinus surgery with ablation of the frontal sinuses    . HERNIA REPAIR    . nasal polypectomies     Social History   Social History Narrative  . Not on file   Immunization History  Administered Date(s) Administered  . Influenza Split 07/08/2012, 07/06/2013, 07/08/2016  . Influenza, High Dose Seasonal PF 06/13/2018  . Influenza,inj,Quad PF,6+ Mos 07/15/2014, 06/19/2017  . Pneumococcal Polysaccharide-23 07/15/2014  . Zoster Recombinat (  Shingrix) 05/23/2018, 08/21/2018     Objective: Vital Signs: BP 111/73 (BP Location: Left Arm, Patient Position: Sitting, Cuff Size: Normal)   Pulse (!) 57   Resp 16   Ht 5\' 8"  (1.727 m)   Wt 192 lb 12.8 oz (87.5 kg)   BMI 29.32 kg/m    Physical Exam Vitals signs and nursing note reviewed.  Constitutional:      Appearance: He is  well-developed.  HENT:     Head: Normocephalic and atraumatic.  Eyes:     Conjunctiva/sclera: Conjunctivae normal.     Pupils: Pupils are equal, round, and reactive to light.  Neck:     Musculoskeletal: Normal range of motion and neck supple.  Cardiovascular:     Rate and Rhythm: Normal rate and regular rhythm.     Heart sounds: Normal heart sounds.  Pulmonary:     Effort: Pulmonary effort is normal.     Breath sounds: Normal breath sounds.  Abdominal:     General: Bowel sounds are normal.     Palpations: Abdomen is soft.  Skin:    General: Skin is warm and dry.     Capillary Refill: Capillary refill takes less than 2 seconds.  Neurological:     Mental Status: He is alert and oriented to person, place, and time.  Psychiatric:        Behavior: Behavior normal.      Musculoskeletal Exam: C-spine, thoracic spine, and lumbar spine good ROM.  Shoulder joints, elbow joints, wrist joints, MCPs, PIPs, and DIPs good ROM with no synovitis.  Ulnar deviation bilaterally.  Bilateral 1st, 2nd, and 3rd MCP synovial thickening but no synovitis.  PIP and DIP synovial thickening consistent with osteoarthritis of both hands.  Right index trigger finger.  Hip joints, knee joints, ankle joints, MTPs, PIPs, and DIPs good ROM with no synovitis.  No warmth or effusion of knee joints.  No tenderness or swelling of ankle joints.   CDAI Exam: CDAI Score: - Patient Global: -; Provider Global: - Swollen: -; Tender: - Joint Exam   No joint exam has been documented for this visit   There is currently no information documented on the homunculus. Go to the Rheumatology activity and complete the homunculus joint exam.  Investigation: No additional findings.  Imaging: No results found.  Recent Labs: Lab Results  Component Value Date   WBC 5.1 03/13/2019   HGB 13.0 03/13/2019   PLT 280 03/13/2019   NA 138 03/13/2019   K 3.7 03/13/2019   CL 109 03/13/2019   CO2 21 (L) 03/13/2019   GLUCOSE 122 (H)  03/13/2019   BUN 13 03/13/2019   CREATININE 1.17 03/13/2019   BILITOT 0.6 03/13/2019   ALKPHOS 60 03/13/2019   AST 31 03/13/2019   ALT 29 03/13/2019   PROT 6.9 03/13/2019   ALBUMIN 3.8 03/13/2019   CALCIUM 9.0 03/13/2019   GFRAA >60 03/13/2019   QFTBGOLD NEGATIVE 07/16/2017   QFTBGOLDPLUS Negative 08/12/2018    Speciality Comments: No specialty comments available.  Procedures:  No procedures performed Allergies: Simponi [golimumab], Aspirin, Other, Sulfonamide derivatives, and Theophyllines   Assessment / Plan:     Visit Diagnoses: Rheumatoid arthritis of multiple sites without rheumatoid factor ([place]): He has no synovitis on exam.  He has not had any recent rheumatoid arthritis flares.  He is experiencing discomfort in bilateral knee joints but no warmth or effusion was noted.  He has been holding Orencia infusions due to undergoing an invasive dental procedure and is being  treated with antibiotics for a postprocedure infection.  He completed antibiotics about 10 days ago.  He was evaluated by Dr. Deforest Hoyles who cleared the patient to restart on Orencia.  We will obtain CBC, CMP, and TB gold prior to scheduling his next infusion.  We would also like written clearance prior to scheduling the Orencia infusion.  He was advised to notify us if he develops increased joint pain or joint swelling.  He will follow-up in the office in 5 months  High risk medication use -Orencia IV infusions.  CBC, CMP, and TB gold were drawn today.  Plan: COMPLETE METABOLIC PANEL WITH GFR, CBC with Differential/Platelet, QuantiFERON-TB Gold Plus  Primary osteoarthritis of both hands: He has PIP and DIP synovial thickening consistent with osteoarthritis of both hands.  He has no synovitis on exam.  He has complete fist formation bilaterally.  Joint protection and muscle strengthening were discussed.  Other medical conditions are listed as follows:  Coarse tremors  History of gastroesophageal reflux (GERD)   History of IBS  History of depression  History of asthma  Family history of MS (multiple sclerosis)  Orders: Orders Placed This Encounter  Procedures  . COMPLETE METABOLIC PANEL WITH GFR  . CBC with Differential/Platelet  . QuantiFERON-TB Gold Plus   No orders of the defined types were placed in this encounter.   Face-to-face time spent with patient was 30 minutes. Greater than 50% of time was spent in counseling and coordination of care.  Follow-Up Instructions: Return in about 5 months (around 01/10/2020) for Rheumatoid arthritis, Osteoarthritis.   Ofilia Neas, PA-C   I examined and evaluated the patient with Hazel Sams PA.  Patient did not have much synovitis on the examination.  Although he had lot of discomfort in his joints.  He states he does feel much better when he is on Orencia.  Orencia was discontinued due to recurrent infections.  He recently finished attics.  We will get clearance from his PCP.  Once we get clearance then we can restart him on Orencia.  The plan of care was discussed as noted above.  Bo Merino, MD  Note - This record has been created using Editor, commissioning.  Chart creation errors have been sought, but may not always  have been located. Such creation errors do not reflect on  the standard of medical care.

## 2019-08-13 ENCOUNTER — Other Ambulatory Visit: Payer: Self-pay | Admitting: *Deleted

## 2019-08-13 NOTE — Progress Notes (Signed)
CBC and CMP WNL

## 2019-08-13 NOTE — Telephone Encounter (Signed)
Left message with Elmyra Ricks, the assistant working with Dr. Lysle Rubens and advised her we need verbal clearance to restart patient on infusions. Asked Elmyra Ricks to call the office to give clearance.

## 2019-08-13 NOTE — Progress Notes (Signed)
Infusion orders are current for patient CBC CMP Tylenol Benadryl appointments are up to date and follow up appointment  is scheduled TB gold not due yet.  

## 2019-08-14 LAB — COMPLETE METABOLIC PANEL WITH GFR
AG Ratio: 1.4 (calc) (ref 1.0–2.5)
ALT: 20 U/L (ref 9–46)
AST: 18 U/L (ref 10–35)
Albumin: 4.2 g/dL (ref 3.6–5.1)
Alkaline phosphatase (APISO): 71 U/L (ref 35–144)
BUN: 15 mg/dL (ref 7–25)
CO2: 27 mmol/L (ref 20–32)
Calcium: 9.6 mg/dL (ref 8.6–10.3)
Chloride: 103 mmol/L (ref 98–110)
Creat: 1.07 mg/dL (ref 0.70–1.25)
GFR, Est African American: 83 mL/min/{1.73_m2} (ref >=60)
GFR, Est Non African American: 72 mL/min/{1.73_m2} (ref >=60)
Globulin: 2.9 g/dL (calc) (ref 1.9–3.7)
Glucose, Bld: 95 mg/dL (ref 65–99)
Potassium: 4.5 mmol/L (ref 3.5–5.3)
Sodium: 140 mmol/L (ref 135–146)
Total Bilirubin: 0.4 mg/dL (ref 0.2–1.2)
Total Protein: 7.1 g/dL (ref 6.1–8.1)

## 2019-08-14 LAB — CBC WITH DIFFERENTIAL/PLATELET
Absolute Monocytes: 744 cells/uL (ref 200–950)
Basophils Absolute: 49 cells/uL (ref 0–200)
Basophils Relative: 0.8 %
Eosinophils Absolute: 122 cells/uL (ref 15–500)
Eosinophils Relative: 2 %
HCT: 40.8 % (ref 38.5–50.0)
Hemoglobin: 13.8 g/dL (ref 13.2–17.1)
Lymphs Abs: 1818 cells/uL (ref 850–3900)
MCH: 30.9 pg (ref 27.0–33.0)
MCHC: 33.8 g/dL (ref 32.0–36.0)
MCV: 91.3 fL (ref 80.0–100.0)
MPV: 10.9 fL (ref 7.5–12.5)
Monocytes Relative: 12.2 %
Neutro Abs: 3367 cells/uL (ref 1500–7800)
Neutrophils Relative %: 55.2 %
Platelets: 263 10*3/uL (ref 140–400)
RBC: 4.47 10*6/uL (ref 4.20–5.80)
RDW: 12.3 % (ref 11.0–15.0)
Total Lymphocyte: 29.8 %
WBC: 6.1 10*3/uL (ref 3.8–10.8)

## 2019-08-14 LAB — QUANTIFERON-TB GOLD PLUS
Mitogen-NIL: >10 IU/mL
NIL: 0.03 IU/mL
QuantiFERON-TB Gold Plus: NEGATIVE
TB1-NIL: 0.01 IU/mL
TB2-NIL: 0.01 IU/mL

## 2019-08-17 NOTE — Progress Notes (Signed)
TB gold negative

## 2019-08-19 ENCOUNTER — Encounter (HOSPITAL_COMMUNITY)
Admission: RE | Admit: 2019-08-19 | Discharge: 2019-08-19 | Disposition: A | Discharge status: Home / Self Care | Payer: Medicare Other | Source: Ambulatory Visit | Attending: Rheumatology | Admitting: Rheumatology

## 2019-08-19 ENCOUNTER — Other Ambulatory Visit: Payer: Self-pay

## 2019-08-19 DIAGNOSIS — M0609 Rheumatoid arthritis without rheumatoid factor, multiple sites: Secondary | ICD-10-CM | POA: Diagnosis not present

## 2019-08-19 MED ORDER — ACETAMINOPHEN 325 MG PO TABS
650.0000 mg | ORAL_TABLET | ORAL | Status: DC
  Administered 2019-08-19: 09:00:00 650 mg via ORAL

## 2019-08-19 MED ORDER — DIPHENHYDRAMINE HCL 25 MG PO CAPS
ORAL_CAPSULE | ORAL | Status: AC
  Filled 2019-08-19: qty 1

## 2019-08-19 MED ORDER — SODIUM CHLORIDE 0.9 % IV SOLN
750.0000 mg | INTRAVENOUS | Status: DC
  Administered 2019-08-19: 750 mg via INTRAVENOUS
  Filled 2019-08-19: qty 30

## 2019-08-19 MED ORDER — DIPHENHYDRAMINE HCL 25 MG PO CAPS
25.0000 mg | ORAL_CAPSULE | ORAL | Status: DC
  Administered 2019-08-19: 25 mg via ORAL

## 2019-08-19 MED ORDER — ACETAMINOPHEN 325 MG PO TABS
ORAL_TABLET | ORAL | Status: AC
  Filled 2019-08-19: qty 2

## 2019-08-27 ENCOUNTER — Other Ambulatory Visit: Payer: Self-pay | Admitting: *Deleted

## 2019-08-27 NOTE — Progress Notes (Signed)
Infusion orders are current for patient CBC CMP Tylenol Benadryl appointments are up to date and follow up appointment  is scheduled TB gold not due yet.  

## 2019-09-16 ENCOUNTER — Inpatient Hospital Stay (HOSPITAL_COMMUNITY): Admission: RE | Admit: 2019-09-16 | Payer: Medicare Other | Source: Ambulatory Visit

## 2019-09-17 ENCOUNTER — Telehealth: Payer: Self-pay | Admitting: Adult Health

## 2019-09-17 ENCOUNTER — Other Ambulatory Visit: Payer: Self-pay

## 2019-09-17 DIAGNOSIS — F39 Unspecified mood [affective] disorder: Secondary | ICD-10-CM

## 2019-09-17 MED ORDER — BENZTROPINE MESYLATE 0.5 MG PO TABS: ORAL_TABLET | ORAL | 2 refills | Status: DC

## 2019-09-17 NOTE — Telephone Encounter (Signed)
Pt called to schedule follow up and request refill for Denztropine 0.5 mg @ [name] [name] on file. Ran out 3-4 days ago.

## 2019-09-17 NOTE — Telephone Encounter (Signed)
Clarification looks like patient is requesting Benztropine (cogentin) 0.5 mg will send refill to [facility].

## 2019-09-18 NOTE — Telephone Encounter (Signed)
Correct Benztropine 0.5 mg refill request. Thank you.

## 2019-09-21 ENCOUNTER — Ambulatory Visit: Payer: Medicare Other | Admitting: Physician Assistant

## 2019-09-22 ENCOUNTER — Ambulatory Visit (INDEPENDENT_AMBULATORY_CARE_PROVIDER_SITE_OTHER): Payer: Medicare Other | Admitting: Adult Health

## 2019-09-22 ENCOUNTER — Encounter: Payer: Self-pay | Admitting: Adult Health

## 2019-09-22 DIAGNOSIS — F411 Generalized anxiety disorder: Secondary | ICD-10-CM | POA: Diagnosis not present

## 2019-09-22 DIAGNOSIS — F331 Major depressive disorder, recurrent, moderate: Secondary | ICD-10-CM

## 2019-09-22 DIAGNOSIS — G4700 Insomnia, unspecified: Secondary | ICD-10-CM | POA: Diagnosis not present

## 2019-09-22 DIAGNOSIS — F39 Unspecified mood [affective] disorder: Secondary | ICD-10-CM

## 2019-09-22 MED ORDER — CLONAZEPAM 1 MG PO TABS: 1.0000 mg | ORAL_TABLET | Freq: Every day | ORAL | 2 refills | Status: DC

## 2019-09-22 NOTE — Progress Notes (Signed)
PLEDGER DEBENEDETTI [id number] May 06, 1951 68 y.o.  Virtual Visit via Telephone Note  I connected with pt on 09/22/19 at  8:00 AM EST by telephone and verified that I am speaking with the correct person using two identifiers.   I discussed the limitations, risks, security and privacy concerns of performing an evaluation and management service by telephone and the availability of in person appointments. I also discussed with the patient that there may be a patient responsible charge related to this service. The patient expressed understanding and agreed to proceed.   I discussed the assessment and treatment plan with the patient. The patient was provided an opportunity to ask questions and all were answered. The patient agreed with the plan and demonstrated an understanding of the instructions.   The patient was advised to call back or seek an in-person evaluation if the symptoms worsen or if the condition fails to improve as anticipated.  I provided 30 minutes of non-face-to-face time during this encounter.  The patient was located at home.  The provider was located at [place].   Aloha Gell, NP   Subjective:   Patient ID:  [name] [name] is a 68 y.o. (DOB [birth year]) male.  Chief Complaint: No chief complaint on file.   HPI [name] [name] presents for follow-up ofAnxiety, Depression, Mood disorder, and Insomnia.  Describes mood today as "ok". Pleasant. Mood symptoms - denies depression and anxiety. Increased irritability at times - "not fighting irritable". Stating "I'm doing pretty good". Gets aggravated with the construction in his neighborhood. Talking with neighbors. Medications working well. Stable interest and motivation. Taking medications as prescribed.  Energy levels stable. Active, has a regular exercise routine. Walking. Retired.   Enjoys some usual interests and activities. Lives alone. Spending time with dog "Minnie". Getting out most days. Appetite  adequate. Weight stable. Sleeps well most nights. Averages 8 to 10 hours - "I sleep great". Focus and concentration stable. Completing tasks. Managing aspects of household. Retired. Denies SI or HI. Denies AH or VH.  Review of Systems:   Review of Systems  Musculoskeletal: Negative for gait problem.  Neurological: Negative for tremors.  Psychiatric/Behavioral:       Please refer to HPI    Medications: I have reviewed the patient's current medications.  Current Outpatient Medications  Medication Sig Dispense Refill  . Abatacept (ORENCIA IV) Inject into the vein every 30 (thirty) days.    Marland Kitchen acetaminophen (TYLENOL) 325 MG tablet Take 650 mg by mouth every 6 (six) hours as needed.    Marland Kitchen albuterol (PROAIR HFA) 108 (90 Base) MCG/ACT inhaler INHALE TWO PUFFS BY MOUTH EVERY 4 HOURS 8.5 each 12  . albuterol (PROAIR HFA) 108 (90 Base) MCG/ACT inhaler Inhale 1-2 puffs into the lungs every 6 (six) hours as needed for wheezing or shortness of breath. 1 Inhaler 5  . amoxicillin-clavulanate (AUGMENTIN) 875-125 MG tablet Take 1 tablet by mouth 2 (two) times daily. (Patient not taking: Reported on 04/20/2019) 20 tablet 0  . amoxicillin-clavulanate (AUGMENTIN) 875-125 MG tablet Take 1 tablet by mouth 2 (two) times daily. (Patient not taking: Reported on 04/20/2019) 20 tablet 0  . ARIPiprazole (ABILIFY) 10 MG tablet Take one tablet at bedtime. 30 tablet 5  . Ascorbic Acid (VITAMIN C PO) Take by mouth daily.    . benztropine (COGENTIN) 0.5 MG tablet Take one tablet at bedtime. 30 tablet 2  . cholestyramine (QUESTRAN) 4 GM/DOSE powder     . clonazePAM (KLONOPIN) 1 MG tablet Take 1 tablet (  1 mg total) by mouth at bedtime. 30 tablet 2  . fluticasone (FLONASE) 50 MCG/ACT nasal spray Place 1 spray into both nostrils daily. 16 g 11  . Fluticasone-Umeclidin-Vilant (TRELEGY ELLIPTA) 100-62.5-25 MCG/INH AEPB Inhale 1 puff into the lungs daily. 60 each 7  . guaiFENesin (MUCUS RELIEF ADULT PO) Take by mouth daily.    Marland Kitchen  loratadine (CLARITIN) 10 MG tablet Take 10 mg by mouth daily.    Marland Kitchen losartan-hydrochlorothiazide (HYZAAR) 100-25 MG tablet Take 1 tablet by mouth daily.     . montelukast (SINGULAIR) 10 MG tablet Take 1 tablet (10 mg total) by mouth daily. 90 tablet 3  . Multiple Vitamins-Minerals (MULTIVITAMIN WITH MINERALS) tablet Take by mouth.    . Multiple Vitamins-Minerals (ZINC PO) Take by mouth.    . nystatin (MYCOSTATIN) 100000 UNIT/ML suspension Take 5 mLs by mouth daily.     . Omeprazole (PRILOSEC PO) Take by mouth 2 (two) times daily.    . Probiotic Product (PROBIOTIC PO) Take by mouth.    . sildenafil (VIAGRA) 100 MG tablet Take 100 mg by mouth daily as needed.      . Testosterone 20.25 MG/ACT (1.62%) GEL Apply 2 pumps as directed once a day     No current facility-administered medications for this visit.    Medication Side Effects: None  Allergies:  Allergies  Allergen Reactions  . Simponi [Golimumab]     Repeated infections  . Aspirin     REACTION: throat swelling  . Other Other (See Comments)    REACTION: oral sores  . Sulfonamide Derivatives     REACTION: oral sores  . Theophyllines     Past Medical History:  Diagnosis Date  . ALLERGIC RHINITIS   . Asthma   . Bipolar depression ([place])   . GERD (gastroesophageal reflux disease)   . Rheumatoid arthritis(714.0)     Family History  Problem Relation Age of Onset  . Multiple sclerosis Father   . Breast cancer Mother   . Breast cancer Sister   . Alcoholism Child        now sober    Social History   Socioeconomic History  . Marital status: Married    Spouse name: Not on file  . Number of children: Not on file  . Years of education: Not on file  . Highest education level: Not on file  Occupational History  . Not on file  Tobacco Use  . Smoking status: Former Smoker    Packs/day: 1.00    Years: 20.00    Pack years: 20.00    Types: Cigarettes    Quit date: 10/08/1998    Years since quitting: 20.9  . Smokeless  tobacco: Former Systems developer    Types: Snuff, Sarina Ser    Quit date: 1988  Substance and Sexual Activity  . Alcohol use: Not Currently  . Drug use: No  . Sexual activity: Not on file  Other Topics Concern  . Not on file  Social History Narrative  . Not on file   Social Determinants of Health   Financial Resource Strain:   . Difficulty of Paying Living Expenses: Not on file  Food Insecurity:   . Worried About Charity fundraiser in the Last Year: Not on file  . Ran Out of Food in the Last Year: Not on file  Transportation Needs:   . Lack of Transportation (Medical): Not on file  . Lack of Transportation (Non-Medical): Not on file  Physical Activity:   . Days of  Exercise per Week: Not on file  . Minutes of Exercise per Session: Not on file  Stress:   . Feeling of Stress : Not on file  Social Connections:   . Frequency of Communication with Friends and Family: Not on file  . Frequency of Social Gatherings with Friends and Family: Not on file  . Attends Religious Services: Not on file  . Active Member of Clubs or Organizations: Not on file  . Attends Archivist Meetings: Not on file  . Marital Status: Not on file  Intimate Partner Violence:   . Fear of Current or Ex-Partner: Not on file  . Emotionally Abused: Not on file  . Physically Abused: Not on file  . Sexually Abused: Not on file    Past Medical History, Surgical history, Social history, and Family history were reviewed and updated as appropriate.   Please see review of systems for further details on the patient's review from today.   Objective:   Physical Exam:  There were no vitals taken for this visit.  Physical Exam Neurological:     Mental Status: He is alert and oriented to person, place, and time.     Cranial Nerves: No dysarthria.  Psychiatric:        Attention and Perception: Attention and perception normal.        Mood and Affect: Mood normal.        Speech: Speech normal.        Behavior: Behavior  is cooperative.        Thought Content: Thought content normal. Thought content is not paranoid or delusional. Thought content does not include homicidal or suicidal ideation. Thought content does not include homicidal or suicidal plan.        Cognition and Memory: Cognition and memory normal.        Judgment: Judgment normal.     Comments: Insight intact     Lab Review:     Component Value Date/Time   NA 140 08/12/2019 0940   K 4.5 08/12/2019 0940   CL 103 08/12/2019 0940   CO2 27 08/12/2019 0940   GLUCOSE 95 08/12/2019 0940   BUN 15 08/12/2019 0940   CREATININE 1.07 08/12/2019 0940   CALCIUM 9.6 08/12/2019 0940   PROT 7.1 08/12/2019 0940   ALBUMIN 3.8 03/13/2019 1133   AST 18 08/12/2019 0940   ALT 20 08/12/2019 0940   ALKPHOS 60 03/13/2019 1133   BILITOT 0.4 08/12/2019 0940   GFRNONAA 72 08/12/2019 0940   GFRAA 83 08/12/2019 0940       Component Value Date/Time   WBC 6.1 08/12/2019 0940   RBC 4.47 08/12/2019 0940   HGB 13.8 08/12/2019 0940   HCT 40.8 08/12/2019 0940   PLT 263 08/12/2019 0940   MCV 91.3 08/12/2019 0940   MCH 30.9 08/12/2019 0940   MCHC 33.8 08/12/2019 0940   RDW 12.3 08/12/2019 0940   LYMPHSABS 1,818 08/12/2019 0940   MONOABS 1.0 02/13/2018 0951   EOSABS 122 08/12/2019 0940   BASOSABS 49 08/12/2019 0940    Lithium Lvl  Date Value Ref Range Status  05/23/2017 0.7 0.6 - 1.2 mmol/L Final    Comment:    ** Please note change in unit of measure and reference range(s). **     No results found for: PHENYTOIN, PHENOBARB, VALPROATE, CBMZ   .res Assessment: Plan:    Plan:  Decrease Abilify 10 to 7.5 mg tablet daily  Clonazepam 1mg  in the morning Discontinue Cogentin 0.5mg  at  hs - feels like it is making him irritable  Patient advised to contact office with any questions, adverse effects, or acute worsening in signs and symptoms.  Discussed potential benefits, risk, and side effects of benzodiazepines to include potential risk of tolerance and  dependence, as well as possible drowsiness. Advised patient not to drive if experiencing drowsiness and to take lowest possible effective dose to minimize risk of dependence and tolerance.  Discussed potential metabolic side effects associated with atypical antipsychotics, as well as potential risk for movement side effects. Advised pt to contact office if movement side effects occur.   Diagnoses and all orders for this visit:  Episodic mood disorder ([place])  Insomnia, unspecified type -     clonazePAM (KLONOPIN) 1 MG tablet; Take 1 tablet (1 mg total) by mouth at bedtime.  Generalized anxiety disorder -     clonazePAM (KLONOPIN) 1 MG tablet; Take 1 tablet (1 mg total) by mouth at bedtime.  Major depressive disorder, recurrent episode, moderate ([place])    Please see After Visit Summary for patient specific instructions.  Future Appointments  Date Time Provider [place]  10/30/2019  9:30 AM Baird Lyons D, MD [facility] None  01/13/2020  9:20 AM Ofilia Neas, PA-C [facility] None    No orders of the defined types were placed in this encounter.     -------------------------------

## 2019-09-23 ENCOUNTER — Telehealth: Payer: Self-pay | Admitting: Rheumatology

## 2019-09-23 NOTE — Telephone Encounter (Signed)
Patient called stating he is not sure when he needs to restart his Orencia infusions.  Patient states he had to stop the infusions due to taking antibiotics.  Patient states he is also scheduled for oral surgery on 10/12/19 and will begin antibiotics again at that time.

## 2019-09-24 ENCOUNTER — Telehealth: Payer: Self-pay | Admitting: Rheumatology

## 2019-09-24 NOTE — Telephone Encounter (Signed)
2 weeks after the surgery if patient has no infection and he gets clearance from his oral surgeon.

## 2019-09-24 NOTE — Telephone Encounter (Signed)
Patient going to have Oral surgery on 01/12/2020, and patient wanted to clarify when he is to stop medication, and when he should restart, etc. Please call to advise.

## 2019-09-24 NOTE — Telephone Encounter (Signed)
Attempted to contact patient and left message on machine to advise patient he can resume orencia 2 weeks after the surgery if patient has no infection and he gets clearance from his oral surgeon.

## 2019-09-24 NOTE — Telephone Encounter (Signed)
Patient states he is having oral surgery on 10/12/2019. Advised patient he can resume orencia 2 weeks after the surgery if patient has no infection and he gets clearance from his oral surgeon. Patient verbalized understanding and will have his oral surgeon faxed a clearance letter to our office after his surgery.

## 2019-09-24 NOTE — Telephone Encounter (Signed)
See previous telephone encounter.

## 2019-10-14 ENCOUNTER — Encounter (HOSPITAL_COMMUNITY): Payer: Medicare Other

## 2019-10-30 ENCOUNTER — Ambulatory Visit (INDEPENDENT_AMBULATORY_CARE_PROVIDER_SITE_OTHER): Payer: Medicare Other | Admitting: Internal Medicine

## 2019-10-30 ENCOUNTER — Other Ambulatory Visit: Payer: Self-pay

## 2019-10-30 ENCOUNTER — Encounter: Payer: Self-pay | Admitting: Internal Medicine

## 2019-10-30 DIAGNOSIS — M0609 Rheumatoid arthritis without rheumatoid factor, multiple sites: Secondary | ICD-10-CM | POA: Diagnosis not present

## 2019-10-30 DIAGNOSIS — J324 Chronic pansinusitis: Secondary | ICD-10-CM | POA: Diagnosis not present

## 2019-10-30 DIAGNOSIS — J4520 Mild intermittent asthma, uncomplicated: Secondary | ICD-10-CM

## 2019-10-30 MED ORDER — AMOXICILLIN 500 MG PO TABS: 500.0000 mg | ORAL_TABLET | Freq: Two times a day (BID) | ORAL | 5 refills | Status: DC

## 2019-10-30 NOTE — Patient Instructions (Signed)
Script sent for amoxacillin to use if needed for sinus infections  If you aren't doing well with your sinus infections I agree you should see Dr Erik Obey  Please call if we can help

## 2019-10-30 NOTE — Assessment & Plan Note (Signed)
Rheumatology changed from Humira to Orencia infusion

## 2019-10-30 NOTE — Progress Notes (Signed)
HPI M former smoker followed for asthma, allergic rhinitis, hx rhinosinusitis, nasal polyps complicated by rheumatoid arthritis. FENO- 08/07/16- 25 Office spirometry 08/07/16- WNL, FEV1 3.59/ 110%, R 0.81 PFT 11/01/16- WNL-FVC 5.30/123%, FEV1 4.0/124%, ratio 0.75, FEF 25-75% 3.49/135%, TLC 131%, DLCO 99% Quantiferon-TB Gold 08/12/19- NEG  ---------------------------------------------------------------  10/29/2018- 69 year old male former smoker followed for asthma, allergic rhinitis, history rhinosinusitis, nasal polyps, complicated by rheumatoid arthritis/MTX/Humira, bipolar, GERD Pro-air HFA, Trelegy, Singulair,.   He is very pleased with asthma control now, crediting elevation of head of bed to reduce reflux, regular use of omeprazole, and Trelegy.  No acute episodes since last here. Tried using rescue inhaler 1 puff twice daily and felt it was not enough, so went back to 2 puffs twice daily.  Breathing does not wake him at night. He has moved to a condo and no longer has to do yard work.  10/30/19- 69 year old male former smoker followed for asthma, allergic rhinitis, history rhinosinusitis, nasal polyps, complicated by rheumatoid arthritis/MTX/Humira, bipolar, GERD Pro-air HFA, Trelegy 100, Singulair,. Flonase,  -----Asthma - Trelegy working.  Has done very well over past year. Recent significant asthma exacerbation after exposed w/o mask to construction dust. Leaned on rescue inhaler for 1 day. Otherwise Trelegy working well. Waxing and waning discolored nasal mucus. Antibiotics help temporarily, but only a few days. X of extensive sinus surgery and previous nasal polyps.  We discussed keeping refillable antibiotic on hand, and script snet. Not sure this will be effective. He is going to f/u with Dr Erik Obey ENT. If he has recurrent nasal polyps, then a biologic agent might be worth considering.  [place] in [place]- making the adjustment.   ROS-see HPI     + = positive Constitutional:   No-   weight loss, night sweats, fevers, chills, fatigue, lassitude. HEENT:   No-  headaches, difficulty swallowing, tooth/dental problems, sore throat,       No-  sneezing, itching, ear ache, nasal congestion,+ post nasal drip,  CV:  No-   chest pain, orthopnea, PND, swelling in lower extremities, anasarca, dizziness, palpitations Resp: No-   shortness of breath with exertion or at rest.                productive cough,   non-productive cough,   coughing up of blood.                 change in color of mucus.    wheezing.   Skin: No rash GI:  heartburn, indigestion, abdominal pain, nausea, vomiting, GU:  MS: +  joint pain or swelling.   Neuro-     nothing unusual Psych:  No- change in mood or affect. No depression or anxiety.  No memory loss.  OBJ General- Alert, Oriented, Affect-appropriate, Distress- none acute,  Skin-  Lymphadenopathy- none Head- atraumatic            Eyes- Gross vision intact, PERRLA, conjunctivae clear secretions            Ears- Hearing, canals-normal            Nose-  no-Septal dev, mucus, polyps+ bilateral, erosion, perforation             Throat- Mallampati III , mucosa clear , drainage- none, tonsils- atrophic, Neck- flexible , trachea midline, no stridor , thyroid nl, carotid no bruit Chest - symmetrical excursion , unlabored           Heart/CV- RRR , no murmur , no  gallop  , no rub, nl s1 s2                           - JVD- none , edema- none, stasis changes- none, varices- none           Lung- + clear,, wheeze- none, cough -none                               dullness-none, rub- none           Chest wall-  Abd-  Br/ Gen/ Rectal- Not done, not indicated Extrem- cyanosis- none, clubbing, none, atrophy- none, strength- nl.                          +Synovial thickening of hand joints. Neuro- + tremor hands, some word-searching

## 2019-10-30 NOTE — Assessment & Plan Note (Signed)
Doing well with Trelegy. Suggested protective mask if unavoidable dust.

## 2019-10-30 NOTE — Assessment & Plan Note (Signed)
I gave refillable amox, but after further discussion, he may need a different antibiotic or strategy. IUf he doesn't do well he will se Dr Erik Obey. Consider discussing a biologic agent like Nucala or Dupixent for nasal polyps if that proves to be the problem.

## 2019-11-03 ENCOUNTER — Ambulatory Visit (INDEPENDENT_AMBULATORY_CARE_PROVIDER_SITE_OTHER): Payer: Medicare Other

## 2019-11-03 ENCOUNTER — Encounter: Payer: Self-pay | Admitting: Internal Medicine

## 2019-11-03 ENCOUNTER — Telehealth: Payer: Self-pay | Admitting: Internal Medicine

## 2019-11-03 ENCOUNTER — Other Ambulatory Visit: Payer: Self-pay

## 2019-11-03 ENCOUNTER — Ambulatory Visit (INDEPENDENT_AMBULATORY_CARE_PROVIDER_SITE_OTHER): Payer: Medicare Other | Admitting: Internal Medicine

## 2019-11-03 DIAGNOSIS — J4531 Mild persistent asthma with (acute) exacerbation: Secondary | ICD-10-CM | POA: Diagnosis not present

## 2019-11-03 DIAGNOSIS — R05 Cough: Secondary | ICD-10-CM | POA: Diagnosis not present

## 2019-11-03 NOTE — Patient Instructions (Signed)
Ok to continue routine breathing meds  Please call if we can help

## 2019-11-03 NOTE — Telephone Encounter (Signed)
Called and spoke with pt letting him know the info stated by CY. Pt said he would come to get cxr performed so order has been placed. Nothing further needed.

## 2019-11-03 NOTE — Assessment & Plan Note (Signed)
Pill dysphagia triggered laryngospasm. Now vocalizing well, no stridor, no wheeze and CXR looks clear of obvious acute process on my read. Discussed future prevention He will speak with prescriber about that particular med He will keep July appointment unless needed sooner

## 2019-11-03 NOTE — Progress Notes (Signed)
HPI M former smoker followed for asthma, allergic rhinitis, hx rhinosinusitis, nasal polyps complicated by rheumatoid arthritis. FENO- 08/07/16- 25 Office spirometry 08/07/16- WNL, FEV1 3.59/ 110%, R 0.81 PFT 11/01/16- WNL-FVC 5.30/123%, FEV1 4.0/124%, ratio 0.75, FEF 25-75% 3.49/135%, TLC 131%, DLCO 99% Quantiferon-TB Gold 08/12/19- NEG  ---------------------------------------------------------------   10/30/19- 69 year old male former smoker followed for asthma, allergic rhinitis, history rhinosinusitis, nasal polyps, complicated by rheumatoid arthritis/MTX/Humira, bipolar, GERD Pro-air HFA, Trelegy 100, Singulair,. Flonase,  -----Asthma - Trelegy working.  Has done very well over past year. Recent significant asthma exacerbation after exposed w/o mask to construction dust. Leaned on rescue inhaler for 1 day. Otherwise Trelegy working well. Waxing and waning discolored nasal mucus. Antibiotics help temporarily, but only a few days. X of extensive sinus surgery and previous nasal polyps.  We discussed keeping refillable antibiotic on hand, and script snet. Not sure this will be effective. He is going to f/u with Dr Erik Obey ENT. If he has r51 year old male former smoker followed for asthma, allergic rhinitis, history rhinosinusitis, nasal polyps, complicated by rheumatoid arthritis/MTX/Humira, bipolar, GERD Pro-air HFA, Trelegy 100, Singulair,. Flonase,ecurrent nasal polyps, then a biologic agent might be worth considering.  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   11/03/19- 69 year old male former smoker followed for asthma, allergic rhinitis, history rhinosinusitis, nasal polyps, complicated by rheumatoid arthritis/MTX/Humira, bipolar, GERD Pro-air HFA, Trelegy 100, Singulair,. Flonase, ------f/u Asthma/ XRAY  Acute visit- Called today reporting he got choked on a pill, triggering asthma attack. CXR 11/03/19- my initial read, uninterpreted- no obvious  atelectasis or infiltrate. Got strangled swallowing benztropine tablet night before last. Frightened, couldn't move air initially. Used rescue inhaler and Trelegy. Says worst of incident lasted about an hour and a half. Didn't call 911.         Still a little scratchy back of throat. Not wheezing.   ROS-see HPI    + = positive Constitutional:   No-   weight loss, night sweats, fevers, chills, fatigue, lassitude. HEENT:   No-  headaches, difficulty swallowing, tooth/dental problems, sore throat,       No-  sneezing, itching, ear ache, nasal congestion,+ post nasal drip,  CV:  No-   chest pain, orthopnea, PND, swelling in lower extremities, anasarca, dizziness, palpitations Resp: No-   shortness of breath with exertion or at rest.                productive cough,   non-productive cough,   coughing up of blood.                 change in color of mucus.    wheezing.   Skin: No rash GI:  heartburn, indigestion, abdominal pain, nausea, vomiting, GU:  MS: +  joint pain or swelling.   Neuro-     nothing unusual Psych:  No- change in mood or affect. No depression or anxiety.  No memory loss.  OBJ General- Alert, Oriented, Affect-+ anxious, Distress- none acute,  Skin-  Lymphadenopathy- none Head- atraumatic            Eyes- Gross vision intact, PERRLA, conjunctivae clear secretions            Ears- Hearing, canals-normal            Nose-  no-Septal dev, mucus, polyps-, erosion, perforation             Throat- Mallampati III , mucosa clear , drainage- none, tonsils- atrophic, Neck- flexible , trachea midline, no  stridor , thyroid nl, carotid no bruit Chest - symmetrical excursion , unlabored           Heart/CV- RRR , no murmur , no gallop  , no rub, nl s1 s2                           - JVD- none , edema- none, stasis changes- none, varices- none           Lung- + clear,, wheeze- none, cough -none                               dullness-none, rub- none           Chest wall-  Abd-  Br/ Gen/  Rectal- Not done, not indicated Extrem- cyanosis- none, clubbing, none, atrophy- none, strength- nl.                          +Synovial thickening of hand joints. Neuro- + tremor hands, some word-searching

## 2019-11-03 NOTE — Telephone Encounter (Signed)
From the description, it sounds as if pill got stuck, triggering reflux and spasm. Reflux has been an important trigger for his asthma in the past.  It may be a good idea to get an outpatient CXR to see if we see evidence of the pill or mild pneumonia. Meanwhile, he can continue his regular asthma meds, use an otc cough syrup like Delsym if needed, use sips of liquids and throat lozenges to soothe his throat.

## 2019-11-03 NOTE — Telephone Encounter (Signed)
Called and spoke with pt in regards to his asthma attack that he had last night. Pt stated when he was coughing, he did cough up some bloody mucus.  Pt said that when he had the attack, he had to use his rescue inhaler at least 3-4 times.   Pt said he had a pill he was trying to swallow that got stuck and he could not get it to go down, and that was when the asthma attack began to happen. Pt said it feels like something is choking him without having anything in his throat to be choking him.  Pt had to use his rescue inhaler at least 3-4 times to try to get the attack to ease off and stated that it took almost 2 hours before symptoms began to get better.   Pt is still coughing some today and having some symptoms but is much better than last night. Pt is having soreness in rib cage still as well as tightness in chest.  Pt would like recommendations in regards to this with him still having some symptoms.  Dr. Annamaria Boots, please advise. Thanks.   Allergies  Allergen Reactions  . Simponi [Golimumab]     Repeated infections  . Aspirin     REACTION: throat swelling  . Other Other (See Comments)    REACTION: oral sores  . Sulfonamide Derivatives     REACTION: oral sores  . Theophyllines      Current Outpatient Medications:  .  Abatacept (ORENCIA IV), Inject into the vein every 30 (thirty) days., Disp: , Rfl:  .  acetaminophen (TYLENOL) 325 MG tablet, Take 650 mg by mouth every 6 (six) hours as needed., Disp: , Rfl:  .  albuterol (PROAIR HFA) 108 (90 Base) MCG/ACT inhaler, Inhale 1-2 puffs into the lungs every 6 (six) hours as needed for wheezing or shortness of breath., Disp: 1 Inhaler, Rfl: 5 .  amoxicillin (AMOXIL) 500 MG tablet, Take 1 tablet (500 mg total) by mouth 2 (two) times daily., Disp: 14 tablet, Rfl: 5 .  amoxicillin-clavulanate (AUGMENTIN) 875-125 MG tablet, Take 1 tablet by mouth 2 (two) times daily., Disp: 20 tablet, Rfl: 0 .  ARIPiprazole (ABILIFY) 10 MG tablet, Take one tablet at  bedtime., Disp: 30 tablet, Rfl: 5 .  Ascorbic Acid (VITAMIN C PO), Take by mouth daily., Disp: , Rfl:  .  benztropine (COGENTIN) 0.5 MG tablet, Take one tablet at bedtime., Disp: 30 tablet, Rfl: 2 .  clonazePAM (KLONOPIN) 1 MG tablet, Take 1 tablet (1 mg total) by mouth at bedtime., Disp: 30 tablet, Rfl: 2 .  fluticasone (FLONASE) 50 MCG/ACT nasal spray, Place 1 spray into both nostrils daily., Disp: 16 g, Rfl: 11 .  Fluticasone-Umeclidin-Vilant (TRELEGY ELLIPTA) 100-62.5-25 MCG/INH AEPB, Inhale 1 puff into the lungs daily., Disp: 60 each, Rfl: 7 .  guaiFENesin (MUCUS RELIEF ADULT PO), Take by mouth daily., Disp: , Rfl:  .  loratadine (CLARITIN) 10 MG tablet, Take 10 mg by mouth daily., Disp: , Rfl:  .  losartan-hydrochlorothiazide (HYZAAR) 100-25 MG tablet, Take 1 tablet by mouth daily. , Disp: , Rfl:  .  montelukast (SINGULAIR) 10 MG tablet, Take 1 tablet (10 mg total) by mouth daily., Disp: 90 tablet, Rfl: 3 .  Multiple Vitamins-Minerals (MULTIVITAMIN WITH MINERALS) tablet, Take by mouth., Disp: , Rfl:  .  Multiple Vitamins-Minerals (ZINC PO), Take by mouth., Disp: , Rfl:  .  nystatin (MYCOSTATIN) 100000 UNIT/ML suspension, Take 5 mLs by mouth daily. , Disp: , Rfl:  .  Omeprazole (PRILOSEC PO), Take by mouth 2 (two) times daily., Disp: , Rfl:  .  Probiotic Product (PROBIOTIC PO), Take by mouth., Disp: , Rfl:  .  sildenafil (VIAGRA) 100 MG tablet, Take 100 mg by mouth daily as needed.  , Disp: , Rfl:

## 2019-11-09 HISTORY — PX: MOUTH SURGERY: SHX715

## 2019-11-10 DIAGNOSIS — L708 Other acne: Secondary | ICD-10-CM | POA: Diagnosis not present

## 2019-11-10 DIAGNOSIS — L400 Psoriasis vulgaris: Secondary | ICD-10-CM | POA: Diagnosis not present

## 2019-11-10 DIAGNOSIS — D1801 Hemangioma of skin and subcutaneous tissue: Secondary | ICD-10-CM | POA: Diagnosis not present

## 2019-11-10 DIAGNOSIS — L814 Other melanin hyperpigmentation: Secondary | ICD-10-CM | POA: Diagnosis not present

## 2019-11-10 DIAGNOSIS — D225 Melanocytic nevi of trunk: Secondary | ICD-10-CM | POA: Diagnosis not present

## 2019-11-10 DIAGNOSIS — L821 Other seborrheic keratosis: Secondary | ICD-10-CM | POA: Diagnosis not present

## 2019-11-20 ENCOUNTER — Ambulatory Visit: Payer: Medicare Other | Attending: Internal Medicine

## 2019-11-20 DIAGNOSIS — Z23 Encounter for immunization: Secondary | ICD-10-CM | POA: Insufficient documentation

## 2019-11-20 NOTE — Progress Notes (Signed)
   Covid-19 Vaccination [facility]  Name:  PAULUS BETKER    MRN: [id number] DOB: Sep 15, 1951  11/20/2019  Mr. Reome was observed post Covid-19 immunization for 15 minutes without incidence. He was provided with Vaccine Information Sheet and instruction to access the V-Safe system.   Mr. Woliver was instructed to call 911 with any severe reactions post vaccine: Marland Kitchen Difficulty breathing  . Swelling of your face and throat  . A fast heartbeat  . A bad rash all over your body  . Dizziness and weakness    Immunizations Administered    Name Date Dose VIS Date Route   Pfizer COVID-19 Vaccine 11/20/2019  8:19 AM 0.3 mL 09/18/2019 Intramuscular   Manufacturer: [place]   Lot: [id number]   [place]: [id number]

## 2019-11-23 ENCOUNTER — Telehealth: Payer: Self-pay

## 2019-11-23 NOTE — Telephone Encounter (Signed)
Received a letter from Dr. Barry Dienes stating patient can resume Orencia IV after 01/27/2020. Dr. Estanislado Pandy reviewed. Called patient and advised, he verbalized understanding.   Will send letter to scan center.

## 2019-11-28 ENCOUNTER — Other Ambulatory Visit: Payer: Self-pay | Admitting: Internal Medicine

## 2019-12-04 ENCOUNTER — Telehealth: Payer: Self-pay | Admitting: Rheumatology

## 2019-12-04 DIAGNOSIS — K219 Gastro-esophageal reflux disease without esophagitis: Secondary | ICD-10-CM | POA: Diagnosis not present

## 2019-12-04 DIAGNOSIS — J4520 Mild intermittent asthma, uncomplicated: Secondary | ICD-10-CM | POA: Diagnosis not present

## 2019-12-04 DIAGNOSIS — E785 Hyperlipidemia, unspecified: Secondary | ICD-10-CM | POA: Diagnosis not present

## 2019-12-04 DIAGNOSIS — I10 Essential (primary) hypertension: Secondary | ICD-10-CM | POA: Diagnosis not present

## 2019-12-04 DIAGNOSIS — M069 Rheumatoid arthritis, unspecified: Secondary | ICD-10-CM | POA: Diagnosis not present

## 2019-12-04 DIAGNOSIS — F319 Bipolar disorder, unspecified: Secondary | ICD-10-CM | POA: Diagnosis not present

## 2019-12-04 NOTE — Telephone Encounter (Signed)
Patient called stating he just had an appointment with his PCP who told him he needed to contact Dr. Estanislado Pandy regarding his left knee and hip pain.  Patient states he has been unable to take Orencia due to making him sick.  Patient states he would like to "find a medication that is best suited for his condition."  Patient is requesting a return call ASAP.

## 2019-12-04 NOTE — Telephone Encounter (Signed)
Patient states he is getting frequent infections when on Orencia. Patient states he has been off [place] for 3 months now. Patient states he is having a flare. Patient would like to discuss other treatment options. Patient has an appointment on 01/13/20. Patient is requesting a sooner appointment to discuss. Patient scheduled for 12/07/19 at 8:40 am.

## 2019-12-04 NOTE — Progress Notes (Signed)
Office Visit Note  Patient: [name] [name]             Date of Birth: Feb 02, 1951           MRN: [id number]             PCP: Wenda Low, MD Referring: Wenda Low, MD Visit Date: 12/07/2019 Occupation: @GUAROCC @  Subjective:  Left hip pain   History of Present Illness: [name] [name] is a 70 y.o. male with a past medical history of seropositive rheumatoid arthritis. He has been holding [place] for the last 3 months due to infections as well as having oral surgery. He states that in the last 6 months he has had 2-4 infections that all originate in his sinuses. His oral surgery was performed on February 1st. Currently, he endorses left hip pain, left knee pain, left foot pain, and right 2nd finger pain. He states that he experiences morning stiffness in his left hip that causes difficulty walking. His right 2nd finger also "pops" in the morning. He states that he experiences some swelling in his bilateral hands and warmth. He denies any other joint pain or joint swelling at this time. He exercises daily.    Activities of Daily Living:  Patient reports morning stiffness for 30 minutes.   Patient Denies nocturnal pain.  Difficulty dressing/grooming: Denies Difficulty climbing stairs: Denies Difficulty getting out of chair: Reports Difficulty using hands for taps, buttons, cutlery, and/or writing: Reports  Review of Systems  Constitutional: Negative for fatigue.  HENT: Negative for mouth sores, mouth dryness and nose dryness.   Eyes: Negative for itching and dryness.  Respiratory: Negative for shortness of breath and difficulty breathing.   Cardiovascular: Negative for chest pain and palpitations.  Gastrointestinal: Negative for blood in stool, constipation and diarrhea.  Endocrine: Negative for increased urination.  Genitourinary: Negative for difficulty urinating and painful urination.  Musculoskeletal: Positive for arthralgias, joint pain, joint swelling and morning  stiffness.  Skin: Negative for rash.  Allergic/Immunologic: Negative for susceptible to infections.  Neurological: Positive for headaches and memory loss. Negative for dizziness and weakness.  Hematological: Negative for bruising/bleeding tendency.  Psychiatric/Behavioral: Negative for confusion.    PMFS History:  Patient Active Problem List   Diagnosis Date Noted  . History of gastroesophageal reflux (GERD) 05/29/2018  . High risk medication use 05/29/2018  . History of IBS 05/29/2018  . History of depression 07/15/2017  . History of asthma 07/08/2017  . 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 Asthmatic bronchitis, mild intermittent, uncomplicated [id number]  . Asthmatic bronchitis with acute exacerbation 10/15/2016  . Herpes zoster 08/01/2013  . Rheumatoid arthritis of multiple sites without rheumatoid factor ([place]) 09/18/2010  . NASAL POLYP 12/24/2007  . Sinusitis, chronic 12/24/2007  . Seasonal and perennial allergic rhinitis 12/24/2007  . Allergic-infective asthma 12/24/2007  . G E R D 12/24/2007    Past Medical History:  Diagnosis Date  . ALLERGIC RHINITIS   . Asthma   . Bipolar depression ([place])   . GERD (gastroesophageal reflux disease)   . Rheumatoid arthritis(714.0)     Family History  Problem Relation Age of Onset  . Multiple sclerosis Father   . Breast cancer Mother   . Breast cancer Sister   . Alcoholism Child        now sober   Past Surgical History:  Procedure Laterality Date  . CARPAL TUNNEL RELEASE Left   . extensive sinus surgery with ablation of the frontal sinuses    . HERNIA  REPAIR    . MOUTH SURGERY  11/2019  . nasal polypectomies    . TOOTH EXTRACTION     Social History   Social History Narrative  . Not on file   Immunization History  Administered Date(s) Administered  . Influenza Split 07/08/2012, 07/06/2013, 07/08/2016  . Influenza, High Dose Seasonal PF 06/13/2018, 08/24/2019  . Influenza,inj,Quad PF,6+ Mos  07/15/2014, 06/19/2017  . PFIZER SARS-COV-2 Vaccination 11/20/2019  . Pneumococcal Polysaccharide-23 07/15/2014  . Zoster Recombinat (Shingrix) 05/23/2018, 08/21/2018     Objective: Vital Signs: BP 123/89 (BP Location: Left Arm, Patient Position: Sitting, Cuff Size: Normal)   Pulse 63   Resp 14   Ht 5\' 8"  (1.727 m)   Wt 187 lb (84.8 kg)   BMI 28.43 kg/m    Physical Exam Vitals and nursing note reviewed.  Constitutional:      General: He is not in acute distress. HENT:     Head: Normocephalic and atraumatic.  Eyes:     Conjunctiva/sclera: Conjunctivae normal.  Cardiovascular:     Rate and Rhythm: Normal rate.  Pulmonary:     Effort: Pulmonary effort is normal.  Abdominal:     Palpations: Abdomen is soft.  Musculoskeletal:        General: No swelling or tenderness.     Cervical back: Normal range of motion.     Right lower leg: No edema.     Left lower leg: No edema.  Skin:    General: Skin is warm and dry.  Neurological:     Mental Status: He is alert and oriented to person, place, and time.  Psychiatric:        Behavior: Behavior normal.      Musculoskeletal Exam: Shoulders and elbow joints with good ROM. Wrist joints with limited ROM. Bilateral MCP, PIP, and DIP synovial joint thickening with ulnar deviation. Trigger finger present of the second right finger. No synovitis noted of hands bilaterally. Good ROM present of bilateral hips, knees, and ankle joints. Pain located over the left trochanteric bursa. No warmth or effusion present over bilateral knees. No tenderness to palpation of bilateral feet.   CDAI Exam: CDAI Score: 2.8  Patient Global: 4 mm; Provider Global: 4 mm Swollen: 0 ; Tender: 4  Joint Exam 12/07/2019      Right  Left  MCP 2   Tender     MCP 3   Tender     MTP 4      Tender  MTP 5      Tender     Investigation: No additional findings.  Imaging: No results found.  Recent Labs: Lab Results  Component Value Date   WBC 6.1 08/12/2019     HGB 13.8 08/12/2019   PLT 263 08/12/2019   NA 140 08/12/2019   K 4.5 08/12/2019   CL 103 08/12/2019   CO2 27 08/12/2019   GLUCOSE 95 08/12/2019   BUN 15 08/12/2019   CREATININE 1.07 08/12/2019   BILITOT 0.4 08/12/2019   ALKPHOS 60 03/13/2019   AST 18 08/12/2019   ALT 20 08/12/2019   PROT 7.1 08/12/2019   ALBUMIN 3.8 03/13/2019   CALCIUM 9.6 08/12/2019   GFRAA 83 08/12/2019   QFTBGOLD NEGATIVE 07/16/2017   QFTBGOLDPLUS NEGATIVE 08/12/2019    Speciality Comments: No specialty comments available.  Procedures:  Large Joint Inj: L greater trochanter on 12/07/2019 9:00 AM Indications: pain Details: 27 G 1.5 in needle, lateral approach  Arthrogram: No  Medications: 40 mg triamcinolone acetonide 40 MG/ML;  1.5 mL lidocaine 1 % Aspirate: 0 mL Outcome: tolerated well, no immediate complications Procedure, treatment alternatives, risks and benefits explained, specific risks discussed. Consent was given by the patient. Immediately prior to procedure a time out was called to verify the correct patient, procedure, equipment, support staff and site/side marked as required. Patient was prepped and draped in the usual sterile fashion.     Allergies: Simponi [golimumab], Aspirin, Other, Sulfonamide derivatives, and Theophyllines   Assessment / Plan:     Visit Diagnoses: Rheumatoid arthritis of multiple sites without rheumatoid factor (HCC)-patient states he has been off Orencia for the last 3-1/2 months due to recurrent infections.  He states he has had 2 sinus infection and one dental infection after dental work.  He has had problems with sinusitis in the past and had seen Dr. Erik Obey.  We had detailed discussion regarding different treatment options.  Any of the other Biologics will be more aggressive and will increase the risk of infection more.  We discussed the option of going back on methotrexate but he did not like the side effects.  We also discussed leflunomide.  He wants to stay on  Orencia and wants to try a less frequent infusions.  We discussed infusing Orencia every other week.  He was in agreement with that.  I also discussed the option of adding a prophylactic antibiotic.  He states sulfa medications causing sores in his mouth.  Have advised him to discuss this further with Dr. Annamaria Boots and see suggestion regarding prophylaxis.  I have also advised him to schedule an appointment with Dr. Erik Obey for evaluation.  High risk medication use - Orencia IV infusions (has been holding for last 3-1/2 months).  - Plan: CBC with Differential/Platelet, COMPLETE METABOLIC PANEL WITH GFR  Primary osteoarthritis of both hands-he has DIP and PIP thickening in his hands.  He also had MCP thickening and ulnar deviation due to rheumatoid arthritis.  Trochanteric bursitis, left hip-he has been having increased pain and discomfort over the left trochanteric bursa.  He had good response to injection in the past.  Per his request left trochanteric bursa was injected again.  He tolerated the procedure well.  Coarse tremors  History of gastroesophageal reflux (GERD)  History of IBS  History of depression  History of asthma  Family history of MS (multiple sclerosis)  Orders: Orders Placed This Encounter  Procedures  . Large Joint Inj  . CBC with Differential/Platelet  . COMPLETE METABOLIC PANEL WITH GFR   No orders of the defined types were placed in this encounter.   Face-to-face time spent with patient was 30  minutes. Greater than 50% of time was spent in counseling and coordination of care.  Follow-Up Instructions: Return in about 3 months (around 03/08/2020) for Rheumatoid arthritis.   Bo Merino, MD  Note - This record has been created using Editor, commissioning.  Chart creation errors have been sought, but may not always  have been located. Such creation errors do not reflect on  the standard of medical care.

## 2019-12-07 ENCOUNTER — Telehealth: Payer: Self-pay | Admitting: Pharmacist

## 2019-12-07 ENCOUNTER — Other Ambulatory Visit: Payer: Self-pay | Admitting: *Deleted

## 2019-12-07 ENCOUNTER — Telehealth: Payer: Self-pay | Admitting: Rheumatology

## 2019-12-07 ENCOUNTER — Other Ambulatory Visit: Payer: Self-pay

## 2019-12-07 ENCOUNTER — Encounter: Payer: Self-pay | Admitting: Physician Assistant

## 2019-12-07 ENCOUNTER — Ambulatory Visit (INDEPENDENT_AMBULATORY_CARE_PROVIDER_SITE_OTHER): Payer: Medicare Other | Admitting: Rheumatology

## 2019-12-07 VITALS — BP 123/89 | HR 63 | Resp 14 | Ht 68.0 in | Wt 187.0 lb

## 2019-12-07 DIAGNOSIS — Z8659 Personal history of other mental and behavioral disorders: Secondary | ICD-10-CM

## 2019-12-07 DIAGNOSIS — G252 Other specified forms of tremor: Secondary | ICD-10-CM

## 2019-12-07 DIAGNOSIS — M19042 Primary osteoarthritis, left hand: Secondary | ICD-10-CM | POA: Diagnosis not present

## 2019-12-07 DIAGNOSIS — Z8709 Personal history of other diseases of the respiratory system: Secondary | ICD-10-CM | POA: Diagnosis not present

## 2019-12-07 DIAGNOSIS — Z8719 Personal history of other diseases of the digestive system: Secondary | ICD-10-CM

## 2019-12-07 DIAGNOSIS — M7062 Trochanteric bursitis, left hip: Secondary | ICD-10-CM

## 2019-12-07 DIAGNOSIS — Z79899 Other long term (current) drug therapy: Secondary | ICD-10-CM

## 2019-12-07 DIAGNOSIS — Z820 Family history of epilepsy and other diseases of the nervous system: Secondary | ICD-10-CM

## 2019-12-07 DIAGNOSIS — M0609 Rheumatoid arthritis without rheumatoid factor, multiple sites: Secondary | ICD-10-CM

## 2019-12-07 DIAGNOSIS — M19041 Primary osteoarthritis, right hand: Secondary | ICD-10-CM | POA: Diagnosis not present

## 2019-12-07 MED ORDER — LIDOCAINE HCL 1 % IJ SOLN
1.5000 mL | INTRAMUSCULAR | Status: AC | PRN
  Administered 2019-12-07: 1.5 mL

## 2019-12-07 MED ORDER — TRIAMCINOLONE ACETONIDE 40 MG/ML IJ SUSP
40.0000 mg | INTRAMUSCULAR | Status: AC | PRN
  Administered 2019-12-07: 40 mg via INTRA_ARTICULAR

## 2019-12-07 NOTE — Telephone Encounter (Signed)
Please change Orencia IV orders and lab monitoring to every 8 weeks.  We are changing due to frequent infections.  His next infusion will be scheduled after 4/21.   Mariella Saa, PharmD, [place], [place] [facility] Pharmacist [phone]  12/07/2019 9:28 AM

## 2019-12-07 NOTE — Telephone Encounter (Signed)
Patient should discuss with Dr. Annamaria Boots about the antibiotic choice.

## 2019-12-07 NOTE — Telephone Encounter (Signed)
Patient decided he would like the antibiotic called in for him. He went home, thought about it, and changed his mind. [facility] uses [facility] in [place]. Patient wanted to make sure doctor was told thank you from him, also.

## 2019-12-07 NOTE — Telephone Encounter (Signed)
Patient advised he should discuss with Dr. Annamaria Boots about the antibiotic choice

## 2019-12-07 NOTE — Patient Instructions (Addendum)
   Please reschedule infusion to resume Orencia after April 21st which is the date you were cleared to resume by your dentist.  We will change your orders to receive Orencia every 8 weeks.

## 2019-12-08 ENCOUNTER — Telehealth: Payer: Self-pay | Admitting: Internal Medicine

## 2019-12-08 DIAGNOSIS — J4531 Mild persistent asthma with (acute) exacerbation: Secondary | ICD-10-CM

## 2019-12-08 LAB — COMPLETE METABOLIC PANEL WITH GFR
AG Ratio: 1.4 (calc) (ref 1.0–2.5)
ALT: 25 U/L (ref 9–46)
AST: 20 U/L (ref 10–35)
Albumin: 4.3 g/dL (ref 3.6–5.1)
Alkaline phosphatase (APISO): 67 U/L (ref 35–144)
BUN: 14 mg/dL (ref 7–25)
CO2: 28 mmol/L (ref 20–32)
Calcium: 10.2 mg/dL (ref 8.6–10.3)
Chloride: 102 mmol/L (ref 98–110)
Creat: 1.1 mg/dL (ref 0.70–1.25)
GFR, Est African American: 80 mL/min/{1.73_m2} (ref >=60)
GFR, Est Non African American: 69 mL/min/{1.73_m2} (ref >=60)
Globulin: 3.1 g/dL (calc) (ref 1.9–3.7)
Glucose, Bld: 110 mg/dL — ABNORMAL HIGH (ref 65–99)
Potassium: 4.5 mmol/L (ref 3.5–5.3)
Sodium: 139 mmol/L (ref 135–146)
Total Bilirubin: 0.5 mg/dL (ref 0.2–1.2)
Total Protein: 7.4 g/dL (ref 6.1–8.1)

## 2019-12-08 LAB — CBC WITH DIFFERENTIAL/PLATELET
Absolute Monocytes: 474 cells/uL (ref 200–950)
Basophils Absolute: 51 cells/uL (ref 0–200)
Basophils Relative: 1.1 %
Eosinophils Absolute: 152 cells/uL (ref 15–500)
Eosinophils Relative: 3.3 %
HCT: 42.1 % (ref 38.5–50.0)
Hemoglobin: 14.3 g/dL (ref 13.2–17.1)
Lymphs Abs: 1826 cells/uL (ref 850–3900)
MCH: 31.4 pg (ref 27.0–33.0)
MCHC: 34 g/dL (ref 32.0–36.0)
MCV: 92.3 fL (ref 80.0–100.0)
MPV: 10.7 fL (ref 7.5–12.5)
Monocytes Relative: 10.3 %
Neutro Abs: 2098 cells/uL (ref 1500–7800)
Neutrophils Relative %: 45.6 %
Platelets: 275 10*3/uL (ref 140–400)
RBC: 4.56 10*6/uL (ref 4.20–5.80)
RDW: 12.8 % (ref 11.0–15.0)
Total Lymphocyte: 39.7 %
WBC: 4.6 10*3/uL (ref 3.8–10.8)

## 2019-12-08 MED ORDER — SULFAMETHOXAZOLE-TRIMETHOPRIM 800-160 MG PO TABS: ORAL_TABLET | ORAL | 0 refills | Status: DC

## 2019-12-08 NOTE — Telephone Encounter (Signed)
Ok to try Septa DS, # 60, 1 twice daily x 1 week, then one daily maintenance. Have him call us to update status after a month.

## 2019-12-08 NOTE — Telephone Encounter (Signed)
Called the patient to make him aware of the response from Dr. Annamaria Boots. The patient voiced understanding, pharmacy confirmed. Prescription sent. Nothing further needed at this time.

## 2019-12-08 NOTE — Telephone Encounter (Signed)
Called and spoke with pt. Pt stated at last OV with Dr. Estanislado Pandy, she mentioned to pt that Dr. Annamaria Boots might be able to put pt on a long-term abx to help keep him feeling better. Pt stated about 3-7 days once he is off of abx, he will begin to have all the same symptoms again that he had prior to abx. Pt said it starts with head congestion then it will go into his chest. Pt will begin to cough and states his cough is productive to where he will be coughing up yellow to green phlegm. Pt stated at times with his symptoms he will occ run a fever.  It seems like the possible abx that pt might be able to be placed on per discussion with Dr. Estanislado Pandy was a sulfa drug. Dr. Annamaria Boots, please advise on all this for pt if he might be able to be placed on an abx long term to help him feel better longer and to keep him from having flare-ups as often as he is.  Allergies  Allergen Reactions  . Simponi [Golimumab]     Repeated infections  . Aspirin     REACTION: throat swelling  . Other Other (See Comments)    REACTION: oral sores  . Sulfonamide Derivatives     REACTION: oral sores  . Theophyllines      Current Outpatient Medications:  .  Abatacept (ORENCIA IV), Inject into the vein every 30 (thirty) days., Disp: , Rfl:  .  acetaminophen (TYLENOL) 325 MG tablet, Take 650 mg by mouth every 6 (six) hours as needed., Disp: , Rfl:  .  albuterol (PROAIR HFA) 108 (90 Base) MCG/ACT inhaler, Inhale 1-2 puffs into the lungs every 6 (six) hours as needed for wheezing or shortness of breath., Disp: 1 Inhaler, Rfl: 5 .  ARIPiprazole (ABILIFY) 10 MG tablet, Take one tablet at bedtime., Disp: 30 tablet, Rfl: 5 .  Ascorbic Acid (VITAMIN C PO), Take by mouth daily., Disp: , Rfl:  .  clonazePAM (KLONOPIN) 1 MG tablet, Take 1 tablet (1 mg total) by mouth at bedtime., Disp: 30 tablet, Rfl: 2 .  fluticasone (FLONASE) 50 MCG/ACT nasal spray, Place 1 spray into both nostrils daily., Disp: 16 g, Rfl: 11 .  guaiFENesin (MUCUS  RELIEF ADULT PO), Take by mouth daily., Disp: , Rfl:  .  loratadine (CLARITIN) 10 MG tablet, Take 10 mg by mouth daily., Disp: , Rfl:  .  losartan-hydrochlorothiazide (HYZAAR) 100-25 MG tablet, Take 1 tablet by mouth daily. , Disp: , Rfl:  .  montelukast (SINGULAIR) 10 MG tablet, Take 1 tablet (10 mg total) by mouth daily., Disp: 90 tablet, Rfl: 3 .  Multiple Vitamins-Minerals (MULTIVITAMIN WITH MINERALS) tablet, Take by mouth., Disp: , Rfl:  .  Multiple Vitamins-Minerals (ZINC PO), Take by mouth., Disp: , Rfl:  .  nystatin (MYCOSTATIN) 100000 UNIT/ML suspension, Take 5 mLs by mouth daily. , Disp: , Rfl:  .  Omeprazole (PRILOSEC PO), Take by mouth 2 (two) times daily., Disp: , Rfl:  .  Probiotic Product (PROBIOTIC PO), Take by mouth., Disp: , Rfl:  .  sildenafil (VIAGRA) 100 MG tablet, Take 100 mg by mouth daily as needed.  , Disp: , Rfl:  .  TRELEGY ELLIPTA 100-62.5-25 MCG/INH AEPB, INHALE ONE PUFF BY MOUTH DAILY, Disp: 60 each, Rfl: 6

## 2019-12-08 NOTE — Progress Notes (Signed)
CBC and CMP are normal.  Glucose is mildly elevated probably not fasting.

## 2019-12-10 NOTE — Telephone Encounter (Signed)
Previous infusion orders cancelled and new infusion orders placed.

## 2019-12-11 ENCOUNTER — Ambulatory Visit: Payer: Medicare Other

## 2019-12-11 ENCOUNTER — Telehealth: Payer: Self-pay | Admitting: *Deleted

## 2019-12-11 NOTE — Telephone Encounter (Signed)
Attempted to contact the patient and left message for patient to call the office. Patient has scheduled his infusion for 12/21/19 and he should not have an infusion scheduled until after 01/27/20. need to have patient cancel infusion and reschedule.

## 2019-12-11 NOTE — Telephone Encounter (Signed)
Patient returned call to the office. Patient will call and reschedule his infusion.

## 2019-12-15 ENCOUNTER — Ambulatory Visit: Payer: Medicare Other | Attending: Internal Medicine

## 2019-12-15 ENCOUNTER — Other Ambulatory Visit: Payer: Self-pay | Admitting: Internal Medicine

## 2019-12-15 DIAGNOSIS — Z23 Encounter for immunization: Secondary | ICD-10-CM | POA: Insufficient documentation

## 2019-12-15 NOTE — Progress Notes (Signed)
   Covid-19 Vaccination Clinic  Name:  [name] [name]    MRN: [id number] DOB: 1951/04/11  12/15/2019  [name] was observed post Covid-19 immunization for 15 minutes without incident. He was provided with Vaccine Information Sheet and instruction to access the V-Safe system.   [name] was instructed to call 911 with any severe reactions post vaccine: Marland Kitchen Difficulty breathing  . Swelling of face and throat  . A fast heartbeat  . A bad rash all over body  . Dizziness and weakness   Immunizations Administered    Name Date Dose VIS Date Route   Pfizer COVID-19 Vaccine 12/15/2019  8:17 AM 0.3 mL 09/18/2019 Intramuscular   Manufacturer: [employer]   Lot: [id number]   [place]: [id number]

## 2019-12-19 ENCOUNTER — Other Ambulatory Visit: Payer: Self-pay | Admitting: Internal Medicine

## 2019-12-19 DIAGNOSIS — H2513 Age-related nuclear cataract, bilateral: Secondary | ICD-10-CM | POA: Diagnosis not present

## 2019-12-19 DIAGNOSIS — H43393 Other vitreous opacities, bilateral: Secondary | ICD-10-CM | POA: Diagnosis not present

## 2019-12-19 DIAGNOSIS — H532 Diplopia: Secondary | ICD-10-CM | POA: Diagnosis not present

## 2019-12-19 DIAGNOSIS — H25013 Cortical age-related cataract, bilateral: Secondary | ICD-10-CM | POA: Diagnosis not present

## 2019-12-21 ENCOUNTER — Inpatient Hospital Stay (HOSPITAL_COMMUNITY): Admission: RE | Admit: 2019-12-21 | Payer: Medicare Other | Source: Ambulatory Visit

## 2019-12-21 ENCOUNTER — Other Ambulatory Visit: Payer: Self-pay

## 2019-12-21 ENCOUNTER — Ambulatory Visit (INDEPENDENT_AMBULATORY_CARE_PROVIDER_SITE_OTHER): Payer: Medicare Other | Admitting: Rheumatology

## 2019-12-21 DIAGNOSIS — M79672 Pain in left foot: Secondary | ICD-10-CM

## 2019-12-21 MED ORDER — TRIAMCINOLONE ACETONIDE 40 MG/ML IJ SUSP
10.0000 mg | INTRAMUSCULAR | Status: AC | PRN
  Administered 2019-12-21: 10 mg via INTRA_ARTICULAR

## 2019-12-21 MED ORDER — LIDOCAINE HCL 1 % IJ SOLN
0.5000 mL | INTRAMUSCULAR | Status: AC | PRN
  Administered 2019-12-21: .5 mL

## 2019-12-21 NOTE — Progress Notes (Signed)
   Procedure Note  Patient: [name] [name]             Date of Birth: July 19, 1951           MRN: [id number]             Visit Date: 12/21/2019  Procedures: Visit Diagnoses:  1. Pain in left foot     Small Joint Inj: L small MTP on 12/21/2019 1:21 PM Indications: pain Details: 27 G needle, ultrasound-guided plantar approach  Spinal Needle: No  Medications: 0.5 mL lidocaine 1 %; 10 mg triamcinolone acetonide 40 MG/ML Aspirate: 0 mL Procedure, treatment alternatives, risks and benefits explained, specific risks discussed. Immediately prior to procedure a time out was called to verify the correct patient, procedure, equipment, support staff and site/side marked as required. Patient was prepped and draped in the usual sterile fashion.    Postprocedure instructions were given. Bo Merino, MD

## 2019-12-29 DIAGNOSIS — G252 Other specified forms of tremor: Secondary | ICD-10-CM | POA: Diagnosis not present

## 2019-12-29 DIAGNOSIS — H52223 Regular astigmatism, bilateral: Secondary | ICD-10-CM | POA: Insufficient documentation

## 2019-12-29 DIAGNOSIS — H532 Diplopia: Secondary | ICD-10-CM

## 2019-12-29 DIAGNOSIS — H5030 Unspecified intermittent heterotropia: Secondary | ICD-10-CM

## 2019-12-29 HISTORY — DX: Regular astigmatism, bilateral: H52.223

## 2019-12-29 HISTORY — DX: Other specified forms of tremor: G25.2

## 2019-12-29 HISTORY — DX: Unspecified intermittent heterotropia: H50.30

## 2019-12-29 HISTORY — DX: Diplopia: H53.2

## 2020-01-04 DIAGNOSIS — J324 Chronic pansinusitis: Secondary | ICD-10-CM | POA: Diagnosis not present

## 2020-01-04 DIAGNOSIS — J329 Chronic sinusitis, unspecified: Secondary | ICD-10-CM | POA: Diagnosis not present

## 2020-01-05 DIAGNOSIS — J324 Chronic pansinusitis: Secondary | ICD-10-CM | POA: Diagnosis not present

## 2020-01-06 ENCOUNTER — Other Ambulatory Visit: Payer: Self-pay | Admitting: Internal Medicine

## 2020-01-06 DIAGNOSIS — J4531 Mild persistent asthma with (acute) exacerbation: Secondary | ICD-10-CM

## 2020-01-13 ENCOUNTER — Ambulatory Visit: Payer: Medicare Other | Admitting: Physician Assistant

## 2020-01-19 DIAGNOSIS — I10 Essential (primary) hypertension: Secondary | ICD-10-CM | POA: Diagnosis not present

## 2020-01-19 DIAGNOSIS — F319 Bipolar disorder, unspecified: Secondary | ICD-10-CM | POA: Diagnosis not present

## 2020-01-19 DIAGNOSIS — M069 Rheumatoid arthritis, unspecified: Secondary | ICD-10-CM | POA: Diagnosis not present

## 2020-01-19 DIAGNOSIS — E785 Hyperlipidemia, unspecified: Secondary | ICD-10-CM | POA: Diagnosis not present

## 2020-01-19 DIAGNOSIS — F329 Major depressive disorder, single episode, unspecified: Secondary | ICD-10-CM | POA: Diagnosis not present

## 2020-01-19 DIAGNOSIS — J45909 Unspecified asthma, uncomplicated: Secondary | ICD-10-CM | POA: Diagnosis not present

## 2020-01-19 DIAGNOSIS — J4520 Mild intermittent asthma, uncomplicated: Secondary | ICD-10-CM | POA: Diagnosis not present

## 2020-02-01 ENCOUNTER — Other Ambulatory Visit: Payer: Self-pay

## 2020-02-01 ENCOUNTER — Ambulatory Visit (HOSPITAL_COMMUNITY)
Admission: RE | Admit: 2020-02-01 | Discharge: 2020-02-01 | Disposition: A | Discharge status: Home / Self Care | Payer: Medicare Other | Source: Ambulatory Visit | Attending: Rheumatology | Admitting: Rheumatology

## 2020-02-01 DIAGNOSIS — Z79899 Other long term (current) drug therapy: Secondary | ICD-10-CM | POA: Diagnosis not present

## 2020-02-01 LAB — CBC
HCT: 45.9 % (ref 39.0–52.0)
Hemoglobin: 14.8 g/dL (ref 13.0–17.0)
MCH: 31.6 pg (ref 26.0–34.0)
MCHC: 32.2 g/dL (ref 30.0–36.0)
MCV: 98.1 fL (ref 80.0–100.0)
Platelets: 306 10*3/uL (ref 150–400)
RBC: 4.68 MIL/uL (ref 4.22–5.81)
RDW: 13.7 % (ref 11.5–15.5)
WBC: 5.4 10*3/uL (ref 4.0–10.5)
nRBC: 0 % (ref 0.0–0.2)

## 2020-02-01 LAB — COMPREHENSIVE METABOLIC PANEL
ALT: 24 U/L (ref 0–44)
AST: 31 U/L (ref 15–41)
Albumin: 3.9 g/dL (ref 3.5–5.0)
Alkaline Phosphatase: 51 U/L (ref 38–126)
Anion gap: 10 (ref 5–15)
BUN: 16 mg/dL (ref 8–23)
CO2: 24 mmol/L (ref 22–32)
Calcium: 9.6 mg/dL (ref 8.9–10.3)
Chloride: 107 mmol/L (ref 98–111)
Creatinine, Ser: 1.25 mg/dL — ABNORMAL HIGH (ref 0.61–1.24)
GFR calc Af Amer: >60 mL/min (ref >60)
GFR calc non Af Amer: 59 mL/min — ABNORMAL LOW (ref >60)
Glucose, Bld: 101 mg/dL — ABNORMAL HIGH (ref 70–99)
Potassium: 4.2 mmol/L (ref 3.5–5.1)
Sodium: 141 mmol/L (ref 135–145)
Total Bilirubin: 1 mg/dL (ref 0.3–1.2)
Total Protein: 7 g/dL (ref 6.5–8.1)

## 2020-02-01 MED ORDER — ACETAMINOPHEN 325 MG PO TABS: 650.0000 mg | ORAL_TABLET | ORAL | Status: DC

## 2020-02-01 MED ORDER — SODIUM CHLORIDE 0.9 % IV SOLN
750.0000 mg | INTRAVENOUS | Status: DC
  Administered 2020-02-01: 750 mg via INTRAVENOUS
  Filled 2020-02-01: qty 30

## 2020-02-01 MED ORDER — DIPHENHYDRAMINE HCL 25 MG PO CAPS: 25.0000 mg | ORAL_CAPSULE | ORAL | Status: DC

## 2020-02-01 NOTE — Progress Notes (Signed)
CBC is normal.  Creatinine is elevated most likely due to diuretic use.  Please forward labs to his PCP.

## 2020-02-05 ENCOUNTER — Other Ambulatory Visit: Payer: Self-pay | Admitting: Adult Health

## 2020-02-05 DIAGNOSIS — F411 Generalized anxiety disorder: Secondary | ICD-10-CM

## 2020-02-05 DIAGNOSIS — G4700 Insomnia, unspecified: Secondary | ICD-10-CM

## 2020-02-07 NOTE — Telephone Encounter (Signed)
Last apt 09/21/2020 was due back 4 weeks

## 2020-02-08 ENCOUNTER — Other Ambulatory Visit: Payer: Self-pay

## 2020-02-08 ENCOUNTER — Telehealth: Payer: Self-pay | Admitting: Adult Health

## 2020-02-08 DIAGNOSIS — F411 Generalized anxiety disorder: Secondary | ICD-10-CM

## 2020-02-08 DIAGNOSIS — G4700 Insomnia, unspecified: Secondary | ICD-10-CM

## 2020-02-08 MED ORDER — CLONAZEPAM 1 MG PO TABS: 1.0000 mg | ORAL_TABLET | Freq: Every day | ORAL | 0 refills | Status: DC

## 2020-02-08 NOTE — Telephone Encounter (Signed)
Needs appt

## 2020-02-08 NOTE — Telephone Encounter (Signed)
Last refill 01/06/2020 pended for Barnett Applebaum to submit

## 2020-02-08 NOTE — Telephone Encounter (Signed)
Pt made appt for 5/5. Would like a refill on clonazepam. Please send to [facility] on [address].

## 2020-02-10 ENCOUNTER — Encounter: Payer: Self-pay | Admitting: Adult Health

## 2020-02-10 ENCOUNTER — Ambulatory Visit (INDEPENDENT_AMBULATORY_CARE_PROVIDER_SITE_OTHER): Payer: Medicare Other | Admitting: Adult Health

## 2020-02-10 ENCOUNTER — Other Ambulatory Visit: Payer: Self-pay

## 2020-02-10 DIAGNOSIS — G4700 Insomnia, unspecified: Secondary | ICD-10-CM

## 2020-02-10 DIAGNOSIS — F39 Unspecified mood [affective] disorder: Secondary | ICD-10-CM | POA: Diagnosis not present

## 2020-02-10 DIAGNOSIS — F331 Major depressive disorder, recurrent, moderate: Secondary | ICD-10-CM

## 2020-02-10 DIAGNOSIS — F411 Generalized anxiety disorder: Secondary | ICD-10-CM

## 2020-02-10 MED ORDER — CLONAZEPAM 1 MG PO TABS: 1.0000 mg | ORAL_TABLET | Freq: Every day | ORAL | 2 refills | Status: DC

## 2020-02-10 MED ORDER — ARIPIPRAZOLE 10 MG PO TABS: ORAL_TABLET | ORAL | 1 refills | Status: DC

## 2020-02-10 MED ORDER — BENZTROPINE MESYLATE 0.5 MG PO TABS: 0.5000 mg | ORAL_TABLET | Freq: Every day | ORAL | 1 refills | Status: DC

## 2020-02-10 NOTE — Progress Notes (Signed)
ASAN STLOUIS SN:[id number] 1951/07/08 69 y.o.  Subjective:   Patient ID:  [name] [name] is a 69 y.o. (DOB [birth year]) male.  Chief Complaint: No chief complaint on file.   HPI [name] [name] presents to the office today for follow-up of Anxiety, Depression, Mood disorder, and Insomnia.  Describes mood today as "ok". Pleasant. Mood symptoms - denies depression, anxiety, and irritability.  Stating "I'm doing pretty good". Denies any "bad feelings or thoughts for months now". Talking with neighbors - "quite a bit more now". Medications working well. Stable interest and motivation. Taking medications as prescribed.  Energy levels stable. Active, has a regular exercise routine - 6 days a week. Walking. Treadmill.    Enjoys some usual interests and activities. Lives alone. Spending time with dog "Minnie". Getting out most days. Appetite adequate. Weight loss - 20 pounds - intentional.  Sleeps well most nights. Averages 8 to 10 hours. Focus and concentration stable. Completing tasks. Managing aspects of household. Retired. Denies SI or HI. Denies AH or VH.  Review of Systems:  Review of Systems  Musculoskeletal: Negative for gait problem.  Neurological: Negative for tremors.  Psychiatric/Behavioral:       Please refer to HPI    Medications: I have reviewed the patient's current medications.  Current Outpatient Medications  Medication Sig Dispense Refill  . Abatacept (ORENCIA IV) Inject into the vein every 30 (thirty) days.    Marland Kitchen acetaminophen (TYLENOL) 325 MG tablet Take 650 mg by mouth every 6 (six) hours as needed.    . ARIPiprazole (ABILIFY) 10 MG tablet Take one tablet at bedtime. 90 tablet 1  . Ascorbic Acid (VITAMIN C PO) Take by mouth daily.    . benztropine (COGENTIN) 0.5 MG tablet Take 1 tablet (0.5 mg total) by mouth daily. 90 tablet 1  . clonazePAM (KLONOPIN) 1 MG tablet Take 1 tablet (1 mg total) by mouth at bedtime. 30 tablet 2  . fluticasone (FLONASE) 50 MCG/ACT  nasal spray Place 1 spray into both nostrils daily. 16 g 11  . guaiFENesin (MUCUS RELIEF ADULT PO) Take by mouth daily.    Marland Kitchen loratadine (CLARITIN) 10 MG tablet Take 10 mg by mouth daily.    Marland Kitchen losartan-hydrochlorothiazide (HYZAAR) 100-25 MG tablet Take 1 tablet by mouth daily.     . montelukast (SINGULAIR) 10 MG tablet TAKE ONE TABLET BY MOUTH DAILY 90 tablet 2  . Multiple Vitamins-Minerals (MULTIVITAMIN WITH MINERALS) tablet Take by mouth.    . Multiple Vitamins-Minerals (ZINC PO) Take by mouth.    . nystatin (MYCOSTATIN) 100000 UNIT/ML suspension Take 5 mLs by mouth daily.     . Omeprazole (PRILOSEC PO) Take by mouth 2 (two) times daily.    Marland Kitchen PROAIR HFA 108 (90 Base) MCG/ACT inhaler INHALE 1-2 PUFFS EVERY 6 HOURS AS NEEDED FOR WHEEZING OR SHORTNESS OF BREATH 8.5 g 4  . Probiotic Product (PROBIOTIC PO) Take by mouth.    . sildenafil (VIAGRA) 100 MG tablet Take 100 mg by mouth daily as needed.      . sulfamethoxazole-trimethoprim (BACTRIM DS) 800-160 MG tablet Take 1 tablet twice a day x 7 days. Then take 1 tablet daily. 60 tablet 0  . TRELEGY ELLIPTA 100-62.5-25 MCG/INH AEPB INHALE ONE PUFF BY MOUTH DAILY 60 each 6   No current facility-administered medications for this visit.    Medication Side Effects: None  Allergies:  Allergies  Allergen Reactions  . Simponi [Golimumab]     Repeated infections  . Aspirin  REACTION: throat swelling  . Other Other (See Comments)    REACTION: oral sores  . Sulfonamide Derivatives     REACTION: oral sores  . Theophyllines     Past Medical History:  Diagnosis Date  . ALLERGIC RHINITIS   . Asthma   . Bipolar depression ([place])   . GERD (gastroesophageal reflux disease)   . Rheumatoid arthritis(714.0)     Family History  Problem Relation Age of Onset  . Multiple sclerosis Father   . Breast cancer Mother   . Breast cancer Sister   . Alcoholism Child        now sober    Social History   Socioeconomic History  . Marital status:  Married    Spouse name: Not on file  . Number of children: Not on file  . Years of education: Not on file  . Highest education level: Not on file  Occupational History  . Not on file  Tobacco Use  . Smoking status: Former Smoker    Packs/day: 1.00    Years: 20.00    Pack years: 20.00    Types: Cigarettes    Quit date: 10/08/1998    Years since quitting: 21.3  . Smokeless tobacco: Former Systems developer    Types: Snuff, Sarina Ser    Quit date: 1988  Substance and Sexual Activity  . Alcohol use: Not Currently  . Drug use: No  . Sexual activity: Not on file  Other Topics Concern  . Not on file  Social History Narrative  . Not on file   Social Determinants of Health   Financial Resource Strain:   . Difficulty of Paying Living Expenses:   Food Insecurity:   . Worried About Charity fundraiser in the Last Year:   . Arboriculturist in the Last Year:   Transportation Needs:   . Film/video editor (Medical):   Marland Kitchen Lack of Transportation (Non-Medical):   Physical Activity:   . Days of Exercise per Week:   . Minutes of Exercise per Session:   Stress:   . Feeling of Stress :   Social Connections:   . Frequency of Communication with Friends and Family:   . Frequency of Social Gatherings with Friends and Family:   . Attends Religious Services:   . Active Member of Clubs or Organizations:   . Attends Archivist Meetings:   Marland Kitchen Marital Status:   Intimate Partner Violence:   . Fear of Current or Ex-Partner:   . Emotionally Abused:   Marland Kitchen Physically Abused:   . Sexually Abused:     Past Medical History, Surgical history, Social history, and Family history were reviewed and updated as appropriate.   Please see review of systems for further details on the patient's review from today.   Objective:   Physical Exam:  There were no vitals taken for this visit.  Physical Exam Constitutional:      General: He is not in acute distress. Musculoskeletal:        General: No deformity.   Neurological:     Mental Status: He is alert and oriented to person, place, and time.     Coordination: Coordination normal.  Psychiatric:        Attention and Perception: Attention and perception normal. He does not perceive auditory or visual hallucinations.        Mood and Affect: Mood normal. Mood is not anxious or depressed. Affect is not labile, blunt, angry or inappropriate.  Speech: Speech normal.        Behavior: Behavior normal.        Thought Content: Thought content normal. Thought content is not paranoid or delusional. Thought content does not include homicidal or suicidal ideation. Thought content does not include homicidal or suicidal plan.        Cognition and Memory: Cognition and memory normal.        Judgment: Judgment normal.     Comments: Insight intact     Lab Review:     Component Value Date/Time   NA 141 02/01/2020 0838   K 4.2 02/01/2020 0838   CL 107 02/01/2020 0838   CO2 24 02/01/2020 0838   GLUCOSE 101 (H) 02/01/2020 0838   BUN 16 02/01/2020 0838   CREATININE 1.25 (H) 02/01/2020 0838   CREATININE 1.10 12/07/2019 0859   CALCIUM 9.6 02/01/2020 0838   PROT 7.0 02/01/2020 0838   ALBUMIN 3.9 02/01/2020 0838   AST 31 02/01/2020 0838   ALT 24 02/01/2020 0838   ALKPHOS 51 02/01/2020 0838   BILITOT 1.0 02/01/2020 0838   GFRNONAA 59 (L) 02/01/2020 0838   GFRNONAA 69 12/07/2019 0859   GFRAA >60 02/01/2020 0838   GFRAA 80 12/07/2019 0859       Component Value Date/Time   WBC 5.4 02/01/2020 0838   RBC 4.68 02/01/2020 0838   HGB 14.8 02/01/2020 0838   HCT 45.9 02/01/2020 0838   PLT 306 02/01/2020 0838   MCV 98.1 02/01/2020 0838   MCH 31.6 02/01/2020 0838   MCHC 32.2 02/01/2020 0838   RDW 13.7 02/01/2020 0838   LYMPHSABS 1,826 12/07/2019 0859   MONOABS 1.0 02/13/2018 0951   EOSABS 152 12/07/2019 0859   BASOSABS 51 12/07/2019 0859    Lithium Lvl  Date Value Ref Range Status  05/23/2017 0.7 0.6 - 1.2 mmol/L Final    Comment:    ** Please  note change in unit of measure and reference range(s). **     No results found for: PHENYTOIN, PHENOBARB, VALPROATE, CBMZ   .res Assessment: Plan:    Plan:  Abilify 10mg  tablet daily  Clonazepam 1mg  in the morning Cogentin 0.5mg  at hs   RTC 3 months  Patient advised to contact office with any questions, adverse effects, or acute worsening in signs and symptoms.  Discussed potential benefits, risk, and side effects of benzodiazepines to include potential risk of tolerance and dependence, as well as possible drowsiness. Advised patient not to drive if experiencing drowsiness and to take lowest possible effective dose to minimize risk of dependence and tolerance.  Discussed potential metabolic side effects associated with atypical antipsychotics, as well as potential risk for movement side effects. Advised pt to contact office if movement side effects occur.   Diagnoses and all orders for this visit:  Major depressive disorder, recurrent episode, moderate (HCC) -     ARIPiprazole (ABILIFY) 10 MG tablet; Take one tablet at bedtime.  Episodic mood disorder (HCC) -     ARIPiprazole (ABILIFY) 10 MG tablet; Take one tablet at bedtime. -     benztropine (COGENTIN) 0.5 MG tablet; Take 1 tablet (0.5 mg total) by mouth daily.  Generalized anxiety disorder -     clonazePAM (KLONOPIN) 1 MG tablet; Take 1 tablet (1 mg total) by mouth at bedtime. -     ARIPiprazole (ABILIFY) 10 MG tablet; Take one tablet at bedtime.  Insomnia, unspecified type -     clonazePAM (KLONOPIN) 1 MG tablet; Take 1 tablet (1 mg total)  by mouth at bedtime.     Please see After Visit Summary for patient specific instructions.  Future Appointments  Date Time Provider [place]  03/08/2020  9:20 AM Ofilia Neas, PA-C [facility] None  03/28/2020  9:00 AM [facility] ROOM 1 [facility] None  05/03/2020  9:00 AM Young, Tarri Fuller D, MD [facility] None    No orders of the defined types were placed in this  encounter.   -------------------------------

## 2020-02-26 NOTE — Progress Notes (Signed)
Office Visit Note  Patient: [name] [name]             Date of Birth: 1950/12/09           MRN: [id number]             PCP: Wenda Low, MD Referring: Wenda Low, MD Visit Date: 03/08/2020 Occupation: @GUAROCC @  Subjective:  Left foot pain   History of Present Illness: [name] [name] is a 69 y.o. male with seronegative rheumatoid arthritis and osteoarthritis.  Patient is on Orencia IV infusions every other month.  He has been spacing infusions due to experiencing recurrent infections.  His last infusion was on 02/01/2020.  He has not noticed any increased joint pain or inflammation since spacing the dosing.  He continues to have pain in the left 5th MTP joint.  He had a cortisone injection performed on 12/21/2019 which did not provide any relief.  He has been taking Tylenol on a daily basis for pain relief.  He continues to workout on a daily basis walking 30 to 45 minutes on the treadmill.  His discomfort is the worst for the first 10-15 minutes and the subsides.  He denies any other joint pain or joint swelling.   Activities of Daily Living:  Patient reports morning stiffness for 30-60 minutes.   Patient Reports nocturnal pain.  Difficulty dressing/grooming: Denies Difficulty climbing stairs: Denies Difficulty getting out of chair: Denies Difficulty using hands for taps, buttons, cutlery, and/or writing: Reports  Review of Systems  Constitutional: Negative for fatigue and night sweats.  HENT: Positive for mouth dryness and nose dryness. Negative for mouth sores.   Eyes: Positive for dryness. Negative for redness and itching.  Respiratory: Negative for shortness of breath and difficulty breathing.   Cardiovascular: Negative for chest pain, palpitations, hypertension, irregular heartbeat and swelling in legs/feet.  Gastrointestinal: Negative for blood in stool, constipation and diarrhea.  Endocrine: Negative for increased urination.  Genitourinary: Negative for difficulty  urinating and painful urination.  Musculoskeletal: Positive for arthralgias, joint pain, joint swelling and morning stiffness. Negative for myalgias, muscle weakness, muscle tenderness and myalgias.  Skin: Negative for color change, rash, hair loss, nodules/bumps, skin tightness, ulcers and sensitivity to sunlight.  Allergic/Immunologic: Negative for susceptible to infections.  Neurological: Negative for dizziness, fainting, numbness, headaches, night sweats and weakness.  Hematological: Negative for bruising/bleeding tendency and swollen glands.  Psychiatric/Behavioral: Negative for depressed mood and sleep disturbance. The patient is not nervous/anxious.     PMFS History:  Patient Active Problem List   Diagnosis Date Noted  . Coarse tremors 12/29/2019  . Diplopia 12/29/2019  . Esotropia, intermittent 12/29/2019  . Regular astigmatism of both eyes 12/29/2019  . History of gastroesophageal reflux (GERD) 05/29/2018  . High risk medication use 05/29/2018  . History of IBS 05/29/2018  . History of depression 07/15/2017  . History of asthma 07/08/2017  . 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 Asthmatic bronchitis, mild intermittent, uncomplicated [id number]  . Asthmatic bronchitis with acute exacerbation 10/15/2016  . Herpes zoster 08/01/2013  . Rheumatoid arthritis of multiple sites without rheumatoid factor ([place]) 09/18/2010  . NASAL POLYP 12/24/2007  . Sinusitis, chronic 12/24/2007  . Seasonal and perennial allergic rhinitis 12/24/2007  . Allergic-infective asthma 12/24/2007  . G E R D 12/24/2007    Past Medical History:  Diagnosis Date  . ALLERGIC RHINITIS   . Asthma   . GERD (gastroesophageal reflux disease)   . Rheumatoid arthritis(714.0)     Family History  Problem Relation Age of Onset  . Multiple sclerosis Father   . Breast cancer Mother   . Breast cancer Sister   . Alcoholism Child        now sober   Past Surgical History:  Procedure Laterality  Date  . CARPAL TUNNEL RELEASE Left   . extensive sinus surgery with ablation of the frontal sinuses    . HERNIA REPAIR    . MOUTH SURGERY  11/2019  . nasal polypectomies    . TOOTH EXTRACTION     Social History   Social History Narrative  . Not on file   Immunization History  Administered Date(s) Administered  . Influenza Split 07/08/2012, 07/06/2013, 07/08/2016  . Influenza, High Dose Seasonal PF 06/13/2018, 08/24/2019  . Influenza,inj,Quad PF,6+ Mos 07/15/2014, 06/19/2017  . PFIZER SARS-COV-2 Vaccination 11/20/2019, 12/15/2019  . Pneumococcal Polysaccharide-23 07/15/2014  . Zoster Recombinat (Shingrix) 05/23/2018, 08/21/2018     Objective: Vital Signs: BP 125/79 (BP Location: Left Arm, Patient Position: Sitting, Cuff Size: Normal)   Pulse (!) 58   Resp 15   Ht 5\' 8"  (1.727 m)   Wt 181 lb 3.2 oz (82.2 kg)   BMI 27.55 kg/m    Physical Exam Vitals and nursing note reviewed.  Constitutional:      Appearance: He is well-developed.  HENT:     Head: Normocephalic and atraumatic.  Eyes:     Conjunctiva/sclera: Conjunctivae normal.     Pupils: Pupils are equal, round, and reactive to light.  Pulmonary:     Effort: Pulmonary effort is normal.  Abdominal:     General: Bowel sounds are normal.     Palpations: Abdomen is soft.  Musculoskeletal:     Cervical back: Normal range of motion and neck supple.  Skin:    General: Skin is warm and dry.     Capillary Refill: Capillary refill takes less than 2 seconds.  Neurological:     Mental Status: He is alert and oriented to person, place, and time.  Psychiatric:        Behavior: Behavior normal.      Musculoskeletal Exam: C-spine has slightly limited lateral rotation bilaterally.  Shoulder joints have good range of motion with no discomfort.  He has a mild right elbow joint contracture.  Wrist joints have limited range of motion.  He has synovial thickening of MCP joints with ulnar deviation.  PIP and DIP thickening  consistent with osteoarthritis of both hands.  Hip joints, knee joints, ankle joints have good range of motion with no discomfort.  He has tenderness over the left 5th MTP joint.  Callus formation on the plantar aspect of the left 5th MTP.   Hammertoes of the left foot noted.  He has PIP and DIP thickening consistent with osteoarthritis of both feet.  CDAI Exam: CDAI Score: 0.4  Patient Global: 2 mm; Provider Global: 2 mm Swollen: 0 ; Tender: 1  Joint Exam 03/08/2020      Right  Left  MTP 5      Tender     Investigation: No additional findings.  Imaging: No results found.  Recent Labs: Lab Results  Component Value Date   WBC 5.4 02/01/2020   HGB 14.8 02/01/2020   PLT 306 02/01/2020   NA 141 02/01/2020   K 4.2 02/01/2020   CL 107 02/01/2020   CO2 24 02/01/2020   GLUCOSE 101 (H) 02/01/2020   BUN 16 02/01/2020   CREATININE 1.25 (H) 02/01/2020   BILITOT 1.0 02/01/2020  ALKPHOS 51 02/01/2020   AST 31 02/01/2020   ALT 24 02/01/2020   PROT 7.0 02/01/2020   ALBUMIN 3.9 02/01/2020   CALCIUM 9.6 02/01/2020   GFRAA >60 02/01/2020   QFTBGOLD NEGATIVE 07/16/2017   QFTBGOLDPLUS NEGATIVE 08/12/2019    Speciality Comments: No specialty comments available.  Procedures:  No procedures performed Allergies: Simponi [golimumab], Aspirin, Other, Sulfonamide derivatives, and Theophyllines   Assessment / Plan:     Visit Diagnoses: Rheumatoid arthritis of multiple sites without rheumatoid factor ([place]) - He presents today with persistent tenderness of the left 5th MTP joint.  He had a cortisone injection performed on 12/21/19, which did not provide any pain relief.  He notices intermittent inflammation, especially after exercising on a daily basis.  Callus formation on the plantar aspect of the left 5th MTP noted.  Hammertoes of left foot noted.  PIP and DIP thickening consistent with osteoarthritis evident. X-rays of both feet were updated today to assess for radiographic progression.  He  is not having any other joint pain or inflammation at this time.  He is on Orencia 750 mg IV infusions every other month.  He has been spacing infusions due to recurrent infections.  He has not noticed increased joint pain since spacing the doses of Orencia. He will continue on the current treatment regimen.  He was advised to notify us if he develops increased joint pain or joint swelling.  He will follow up in 5 months. Plan: XR Foot 2 Views Right, XR Foot 2 Views Left  High risk medication use -  Orencia 750 mg IV infusions every other month-spacing orencia due to recurrent infections.  CBC and CMP were drawn on 02/01/2020.  Lab work was reviewed with the patient today in the office.  He has upcoming lab work with his next infusion in June.  TB gold was negative on 08/12/2019.   Primary osteoarthritis of both hands: He has PIP and DIP thickening consistent with osteoarthritis of both hands.  No tenderness or inflammation was noted.  He has complete fist formation bilaterally.  Joint protection and muscle strengthening were discussed.  Pain in left foot: He has tenderness of the left 5th MTP joint.  Callus formation noted. He did not have any injuries prior to the onset of symptoms.  He is able to bear full weight.  He had a cortisone injection in the left 5th MTP joint on 12/21/19, which did not provide any pain relief. X-rays of the left foot were obtained.  X-rays of his feet were remarkable for osteoarthritis and rheumatoid arthritis overlap.  There were no erosive changes and no radiographic progression.  I believe his symptoms are coming from the callus.  Has been advised to schedule an appointment with the podiatrist.  Trochanteric bursitis, left hip: Resolved   Other medical conditions are listed as follows:   Coarse tremors  History of gastroesophageal reflux (GERD)  History of IBS  History of depression  History of asthma  Family history of MS (multiple  sclerosis)    Orders: Orders Placed This Encounter  Procedures  . XR Foot 2 Views Right  . XR Foot 2 Views Left   No orders of the defined types were placed in this encounter.   Face-to-face time spent with patient was 30 minutes. Greater than 50% of time was spent in counseling and coordination of care.  Follow-Up Instructions: Return in about 5 months (around 08/08/2020) for Rheumatoid arthritis.   Ofilia Neas, PA-C   I examined  and evaluated the patient with Hazel Sams PA. The plan of care was discussed as noted above.  Bo Merino, MD Note - This record has been created using Editor, commissioning.  Chart creation errors have been sought, but may not always  have been located. Such creation errors do not reflect on  the standard of medical care.

## 2020-02-29 ENCOUNTER — Encounter (HOSPITAL_COMMUNITY): Payer: Medicare Other

## 2020-03-08 ENCOUNTER — Ambulatory Visit: Payer: Self-pay

## 2020-03-08 ENCOUNTER — Other Ambulatory Visit: Payer: Self-pay

## 2020-03-08 ENCOUNTER — Encounter: Payer: Self-pay | Admitting: Physician Assistant

## 2020-03-08 ENCOUNTER — Ambulatory Visit (INDEPENDENT_AMBULATORY_CARE_PROVIDER_SITE_OTHER): Payer: Medicare Other | Admitting: Physician Assistant

## 2020-03-08 VITALS — BP 125/79 | HR 58 | Resp 15 | Ht 68.0 in | Wt 181.2 lb

## 2020-03-08 DIAGNOSIS — M7062 Trochanteric bursitis, left hip: Secondary | ICD-10-CM | POA: Diagnosis not present

## 2020-03-08 DIAGNOSIS — M19042 Primary osteoarthritis, left hand: Secondary | ICD-10-CM | POA: Diagnosis not present

## 2020-03-08 DIAGNOSIS — Z8709 Personal history of other diseases of the respiratory system: Secondary | ICD-10-CM | POA: Diagnosis not present

## 2020-03-08 DIAGNOSIS — Z79899 Other long term (current) drug therapy: Secondary | ICD-10-CM | POA: Diagnosis not present

## 2020-03-08 DIAGNOSIS — G252 Other specified forms of tremor: Secondary | ICD-10-CM | POA: Diagnosis not present

## 2020-03-08 DIAGNOSIS — Z820 Family history of epilepsy and other diseases of the nervous system: Secondary | ICD-10-CM | POA: Diagnosis not present

## 2020-03-08 DIAGNOSIS — M79672 Pain in left foot: Secondary | ICD-10-CM | POA: Diagnosis not present

## 2020-03-08 DIAGNOSIS — Z8659 Personal history of other mental and behavioral disorders: Secondary | ICD-10-CM | POA: Diagnosis not present

## 2020-03-08 DIAGNOSIS — M19041 Primary osteoarthritis, right hand: Secondary | ICD-10-CM | POA: Diagnosis not present

## 2020-03-08 DIAGNOSIS — M0609 Rheumatoid arthritis without rheumatoid factor, multiple sites: Secondary | ICD-10-CM

## 2020-03-08 DIAGNOSIS — Z8719 Personal history of other diseases of the digestive system: Secondary | ICD-10-CM

## 2020-03-08 DIAGNOSIS — M79671 Pain in right foot: Secondary | ICD-10-CM | POA: Diagnosis not present

## 2020-03-08 NOTE — Patient Instructions (Signed)
[facility]   [address], [place] [address] Phone: [phone] Fax: [phone]

## 2020-03-21 ENCOUNTER — Ambulatory Visit: Payer: Medicare Other | Admitting: Podiatry

## 2020-03-22 ENCOUNTER — Ambulatory Visit (INDEPENDENT_AMBULATORY_CARE_PROVIDER_SITE_OTHER): Payer: Medicare Other | Admitting: Podiatry

## 2020-03-22 ENCOUNTER — Encounter: Payer: Self-pay | Admitting: Podiatry

## 2020-03-22 DIAGNOSIS — Q828 Other specified congenital malformations of skin: Secondary | ICD-10-CM

## 2020-03-22 NOTE — Progress Notes (Signed)
Subjective:  Patient ID: [name] [name], male    DOB: [birth year],  MRN: [id number]  Chief Complaint  Patient presents with  . Callouses    Patient presents today for painful callous bottom of left 5th sub met x 3-4 weeks    69 y.o. male presents with the above complaint.  Patient presents with left submetatarsal 5 porokeratosis.  Patient states is painful to walk on.  Patient states is been going on for 3 to 4 weeks has progressive gotten worse.  There is sharp stabbing pain associated with it.  Patient has not tried any treatment.  It is worsened when ambulating.  He denies any other acute complaints.  He denies seeing anyone else prior to seeing me.   Review of Systems: Negative except as noted in the HPI. Denies N/V/F/Ch.  Past Medical History:  Diagnosis Date  . ALLERGIC RHINITIS   . Asthma   . GERD (gastroesophageal reflux disease)   . Rheumatoid arthritis(714.0)     Current Outpatient Medications:  .  Abatacept (ORENCIA IV), Inject into the vein every 30 (thirty) days., Disp: , Rfl:  .  acetaminophen (TYLENOL) 325 MG tablet, Take 650 mg by mouth every 6 (six) hours as needed., Disp: , Rfl:  .  ARIPiprazole (ABILIFY) 10 MG tablet, Take one tablet at bedtime., Disp: 90 tablet, Rfl: 1 .  benztropine (COGENTIN) 0.5 MG tablet, Take 1 tablet (0.5 mg total) by mouth daily., Disp: 90 tablet, Rfl: 1 .  clonazePAM (KLONOPIN) 1 MG tablet, Take 1 tablet (1 mg total) by mouth at bedtime., Disp: 30 tablet, Rfl: 2 .  fluticasone (FLONASE) 50 MCG/ACT nasal spray, Place 1 spray into both nostrils daily., Disp: 16 g, Rfl: 11 .  guaiFENesin (MUCUS RELIEF ADULT PO), Take by mouth daily., Disp: , Rfl:  .  loratadine (CLARITIN) 10 MG tablet, Take 10 mg by mouth daily., Disp: , Rfl:  .  losartan-hydrochlorothiazide (HYZAAR) 100-25 MG tablet, Take 1 tablet by mouth daily. , Disp: , Rfl:  .  montelukast (SINGULAIR) 10 MG tablet, TAKE ONE TABLET BY MOUTH DAILY, Disp: 90 tablet, Rfl: 2 .  nystatin  (MYCOSTATIN) 100000 UNIT/ML suspension, Take 5 mLs by mouth daily. , Disp: , Rfl:  .  Omeprazole (PRILOSEC PO), Take by mouth 2 (two) times daily., Disp: , Rfl:  .  PROAIR HFA 108 (90 Base) MCG/ACT inhaler, INHALE 1-2 PUFFS EVERY 6 HOURS AS NEEDED FOR WHEEZING OR SHORTNESS OF BREATH, Disp: 8.5 g, Rfl: 4 .  Probiotic Product (PROBIOTIC PO), Take by mouth., Disp: , Rfl:  .  TRELEGY ELLIPTA 100-62.5-25 MCG/INH AEPB, INHALE ONE PUFF BY MOUTH DAILY, Disp: 60 each, Rfl: 6  Social History   Tobacco Use  Smoking Status Former Smoker  . Packs/day: 1.00  . Years: 20.00  . Pack years: 20.00  . Types: Cigarettes  . Quit date: 10/08/1998  . Years since quitting: 21.4  Smokeless Tobacco Former Systems developer  . Types: Snuff, Chew  . Quit date: 1988    Allergies  Allergen Reactions  . Simponi [Golimumab]     Repeated infections  . Aspirin     REACTION: throat swelling  . Other Other (See Comments)    REACTION: oral sores  . Sulfonamide Derivatives     REACTION: oral sores  . Theophyllines    Objective:  There were no vitals filed for this visit. There is no height or weight on file to calculate BMI. Constitutional Well developed. Well nourished.  Vascular Dorsalis pedis pulses palpable bilaterally.  Posterior tibial pulses palpable bilaterally. Capillary refill normal to all digits.  No cyanosis or clubbing noted. Pedal hair growth normal.  Neurologic Normal speech. Oriented to person, place, and time. Epicritic sensation to light touch grossly present bilaterally.  Dermatologic Nails well groomed and normal in appearance. No open wounds. No skin lesions.  Orthopedic:  Pain on palpation to the left submetatarsal 5 hyperkeratotic lesion with a central nucleated core.  Plantarflexed metatarsal noted.  Mild tailor's bunion noted.   Radiographs:  Assessment:   1. Porokeratosis    Plan:  Patient was evaluated and treated and all questions answered.  Left submetatarsal 5 porokeratosis -I  explained to patient the etiology of porokeratosis and various treatment options were discussed.  Given the architecture of his foot he is putting a lot of pressure to the lateral fifth metatarsal head and therefore putting excessive pressure on the porokeratosis leading to a lot of pain.  I believe patient will benefit from a debridement of the porokeratosis with excision of the nucleated core.  Patient agrees with the plan.  I also gave him offloading pad as well if this is helpful we will discuss orthotics management during next visit. -I will hold off on a steroid injection as he he was recently given steroid injection in the rheumatology clinic previously.  No follow-ups on file.

## 2020-03-28 ENCOUNTER — Other Ambulatory Visit: Payer: Self-pay

## 2020-03-28 ENCOUNTER — Ambulatory Visit (HOSPITAL_COMMUNITY)
Admission: RE | Admit: 2020-03-28 | Discharge: 2020-03-28 | Disposition: A | Discharge status: Home / Self Care | Payer: Medicare Other | Source: Ambulatory Visit | Attending: Rheumatology | Admitting: Rheumatology

## 2020-03-28 DIAGNOSIS — Z79899 Other long term (current) drug therapy: Secondary | ICD-10-CM | POA: Diagnosis not present

## 2020-03-28 LAB — CBC
HCT: 43 % (ref 39.0–52.0)
Hemoglobin: 13.9 g/dL (ref 13.0–17.0)
MCH: 31.8 pg (ref 26.0–34.0)
MCHC: 32.3 g/dL (ref 30.0–36.0)
MCV: 98.4 fL (ref 80.0–100.0)
Platelets: 242 10*3/uL (ref 150–400)
RBC: 4.37 MIL/uL (ref 4.22–5.81)
RDW: 12.6 % (ref 11.5–15.5)
WBC: 5 10*3/uL (ref 4.0–10.5)
nRBC: 0 % (ref 0.0–0.2)

## 2020-03-28 LAB — COMPREHENSIVE METABOLIC PANEL
ALT: 29 U/L (ref 0–44)
AST: 27 U/L (ref 15–41)
Albumin: 3.8 g/dL (ref 3.5–5.0)
Alkaline Phosphatase: 49 U/L (ref 38–126)
Anion gap: 9 (ref 5–15)
BUN: 11 mg/dL (ref 8–23)
CO2: 26 mmol/L (ref 22–32)
Calcium: 9.4 mg/dL (ref 8.9–10.3)
Chloride: 105 mmol/L (ref 98–111)
Creatinine, Ser: 1.07 mg/dL (ref 0.61–1.24)
GFR calc Af Amer: >60 mL/min (ref >60)
GFR calc non Af Amer: >60 mL/min (ref >60)
Glucose, Bld: 108 mg/dL — ABNORMAL HIGH (ref 70–99)
Potassium: 3.9 mmol/L (ref 3.5–5.1)
Sodium: 140 mmol/L (ref 135–145)
Total Bilirubin: 0.4 mg/dL (ref 0.3–1.2)
Total Protein: 6.7 g/dL (ref 6.5–8.1)

## 2020-03-28 MED ORDER — DIPHENHYDRAMINE HCL 25 MG PO CAPS: 25.0000 mg | ORAL_CAPSULE | ORAL | Status: DC

## 2020-03-28 MED ORDER — SODIUM CHLORIDE 0.9 % IV SOLN
750.0000 mg | INTRAVENOUS | Status: DC
  Administered 2020-03-28: 750 mg via INTRAVENOUS
  Filled 2020-03-28: qty 30

## 2020-03-28 MED ORDER — ACETAMINOPHEN 325 MG PO TABS: 650.0000 mg | ORAL_TABLET | ORAL | Status: DC

## 2020-03-28 NOTE — Progress Notes (Signed)
CBC and CMP are within normal limits.

## 2020-03-30 DIAGNOSIS — I10 Essential (primary) hypertension: Secondary | ICD-10-CM | POA: Diagnosis not present

## 2020-03-30 DIAGNOSIS — E785 Hyperlipidemia, unspecified: Secondary | ICD-10-CM | POA: Diagnosis not present

## 2020-03-30 DIAGNOSIS — F329 Major depressive disorder, single episode, unspecified: Secondary | ICD-10-CM | POA: Diagnosis not present

## 2020-03-30 DIAGNOSIS — F319 Bipolar disorder, unspecified: Secondary | ICD-10-CM | POA: Diagnosis not present

## 2020-03-30 DIAGNOSIS — J45909 Unspecified asthma, uncomplicated: Secondary | ICD-10-CM | POA: Diagnosis not present

## 2020-03-30 DIAGNOSIS — J4520 Mild intermittent asthma, uncomplicated: Secondary | ICD-10-CM | POA: Diagnosis not present

## 2020-03-30 DIAGNOSIS — M069 Rheumatoid arthritis, unspecified: Secondary | ICD-10-CM | POA: Diagnosis not present

## 2020-04-22 ENCOUNTER — Telehealth: Payer: Self-pay | Admitting: Internal Medicine

## 2020-04-22 NOTE — Telephone Encounter (Signed)
There is no callback number left on this message. Pt's PCP is with [facility], called this office at [phone] to see if they are the ones who reached out to our office. I had to leave a VM.  When they return call we need a fax number to send the rx to.

## 2020-04-25 ENCOUNTER — Other Ambulatory Visit: Payer: Self-pay | Admitting: Internal Medicine

## 2020-04-25 MED ORDER — TRELEGY ELLIPTA 100-62.5-25 MCG/INH IN AEPB: INHALATION_SPRAY | RESPIRATORY_TRACT | 4 refills | Status: DC

## 2020-04-26 DIAGNOSIS — E785 Hyperlipidemia, unspecified: Secondary | ICD-10-CM | POA: Diagnosis not present

## 2020-04-26 DIAGNOSIS — I10 Essential (primary) hypertension: Secondary | ICD-10-CM | POA: Diagnosis not present

## 2020-04-26 DIAGNOSIS — F329 Major depressive disorder, single episode, unspecified: Secondary | ICD-10-CM | POA: Diagnosis not present

## 2020-04-26 DIAGNOSIS — J4520 Mild intermittent asthma, uncomplicated: Secondary | ICD-10-CM | POA: Diagnosis not present

## 2020-04-26 DIAGNOSIS — F319 Bipolar disorder, unspecified: Secondary | ICD-10-CM | POA: Diagnosis not present

## 2020-04-26 DIAGNOSIS — J45909 Unspecified asthma, uncomplicated: Secondary | ICD-10-CM | POA: Diagnosis not present

## 2020-04-26 DIAGNOSIS — M069 Rheumatoid arthritis, unspecified: Secondary | ICD-10-CM | POA: Diagnosis not present

## 2020-04-27 NOTE — Telephone Encounter (Signed)
Called and LMTCB with [facility]

## 2020-04-28 NOTE — Telephone Encounter (Signed)
Rx for trelegy has been printed so it can be sent with the patient assistance form.

## 2020-05-03 ENCOUNTER — Encounter: Payer: Self-pay | Admitting: Internal Medicine

## 2020-05-03 ENCOUNTER — Ambulatory Visit (INDEPENDENT_AMBULATORY_CARE_PROVIDER_SITE_OTHER): Payer: Medicare Other | Admitting: Internal Medicine

## 2020-05-03 ENCOUNTER — Other Ambulatory Visit: Payer: Self-pay

## 2020-05-03 DIAGNOSIS — J3089 Other allergic rhinitis: Secondary | ICD-10-CM | POA: Diagnosis not present

## 2020-05-03 DIAGNOSIS — J4520 Mild intermittent asthma, uncomplicated: Secondary | ICD-10-CM | POA: Diagnosis not present

## 2020-05-03 DIAGNOSIS — J302 Other seasonal allergic rhinitis: Secondary | ICD-10-CM | POA: Diagnosis not present

## 2020-05-03 MED ORDER — FLUTICASONE PROPIONATE 50 MCG/ACT NA SUSP: 1.0000 | Freq: Every day | NASAL | 4 refills | Status: AC

## 2020-05-03 NOTE — Progress Notes (Signed)
HPI M former smoker followed for asthma, allergic rhinitis, hx rhinosinusitis, nasal polyps complicated by rheumatoid arthritis. FENO- 08/07/16- 25 Office spirometry 08/07/16- WNL, FEV1 3.59/ 110%, R 0.81 PFT 11/01/16- WNL-FVC 5.30/123%, FEV1 4.0/124%, ratio 0.75, FEF 25-75% 3.49/135%, TLC 131%, DLCO 99% Quantiferon-TB Gold 08/12/19- NEG  ---------------------------------------------------------------    11/03/19- 69 year old male former smoker followed for asthma, allergic rhinitis, history rhinosinusitis, nasal polyps, complicated by rheumatoid arthritis/MTX/Humira, bipolar, GERD Pro-air HFA, Trelegy 100, Singulair,. Flonase, ------f/u Asthma/ XRAY  Acute visit- Called today reporting he got choked on a pill, triggering asthma attack. CXR 11/03/19- my initial read, uninterpreted- no obvious atelectasis or infiltrate. Got strangled swallowing benztropine tablet night before last. Frightened, couldn't move air initially. Used rescue inhaler and Trelegy. Says worst of incident lasted about an hour and a half. Didn't call 911.         Still a little scratchy back of throat. Not wheezing.   05/03/20- 69 year old male former smoker followed for Asthma, Allergic Rhinitis, history rhinosinusitis, nasal polyps, complicated by Rheumatoid Arthritis/MTX/ Orencia, Bipolar, GERD Pro-air HFA, Trelegy 100, Singulair,. Flonase, -----Breathing improved with Trelegy. ACT score 18 with occasional asthma waking, used rescue 3x/ 4 weeks. He feels very well controlled compared with remote past and credits Trelegy. Asks refill flonase since Dr Erik Obey retired.  Had 2 Phizer Covax. Discussed experience with Orencia for RA- noted arthralgias when he had to be off it.  CXR 11/03/19- Lungs clear. Heart size normal. No pneumothorax or pleural fluid. No acute or focal bony abnormality. IMPRESSION: Negative chest.  ROS-see HPI    + = positive Constitutional:   No-   weight loss, night sweats, fevers, chills, fatigue,  lassitude. HEENT:   No-  headaches, difficulty swallowing, tooth/dental problems, sore throat,       No-  sneezing, itching, ear ache, nasal congestion,+ post nasal drip,  CV:  No-   chest pain, orthopnea, PND, swelling in lower extremities, anasarca, dizziness, palpitations Resp: No-   shortness of breath with exertion or at rest.                productive cough,   non-productive cough,   coughing up of blood.                 change in color of mucus.    wheezing.   Skin: No rash GI:  heartburn, indigestion, abdominal pain, nausea, vomiting, GU:  MS: +  joint pain or swelling.   Neuro-     nothing unusual Psych:  No- change in mood or affect. No depression or anxiety.  No memory loss.  OBJ General- Alert, Oriented, Affect-+ anxious, Distress- none acute,  Skin-  Lymphadenopathy- none Head- atraumatic            Eyes- Gross vision intact, PERRLA, conjunctivae clear secretions            Ears- Hearing, canals-normal            Nose-  no-Septal dev, mucus, polyps-, erosion, perforation             Throat- Mallampati III , mucosa clear , drainage- none, tonsils- atrophic, Neck- flexible , trachea midline, no stridor , thyroid nl, carotid no bruit Chest - symmetrical excursion , unlabored           Heart/CV- RRR , no murmur , no gallop  , no rub, nl s1 s2                           -  JVD- none , edema- none, stasis changes- none, varices- none           Lung- + clear,, wheeze- none, cough -none                               dullness-none, rub- none           Chest wall-  Abd-  Br/ Gen/ Rectal- Not done, not indicated Extrem- cyanosis- none, clubbing, none, atrophy- none, strength- nl.                          +Synovial thickening of hand joints. Neuro- + tremor hands, some word-searching

## 2020-05-03 NOTE — Patient Instructions (Signed)
Flonase refilled  We can continue current meds  Please cal if we can help

## 2020-05-03 NOTE — Assessment & Plan Note (Signed)
History of extensive sinus surgery and polypectomy.  Plan- continue flonase to suppress polyps

## 2020-05-03 NOTE — Assessment & Plan Note (Signed)
Much better control with Trelegy. He is pleased and satisfied.  Plan- continue current meds as reviewed

## 2020-05-05 ENCOUNTER — Ambulatory Visit: Payer: Medicare Other | Admitting: Podiatry

## 2020-05-12 ENCOUNTER — Encounter: Payer: Self-pay | Admitting: Adult Health

## 2020-05-12 ENCOUNTER — Ambulatory Visit (INDEPENDENT_AMBULATORY_CARE_PROVIDER_SITE_OTHER): Payer: Medicare Other | Admitting: Adult Health

## 2020-05-12 ENCOUNTER — Other Ambulatory Visit: Payer: Self-pay

## 2020-05-12 DIAGNOSIS — F39 Unspecified mood [affective] disorder: Secondary | ICD-10-CM

## 2020-05-12 DIAGNOSIS — G4700 Insomnia, unspecified: Secondary | ICD-10-CM | POA: Diagnosis not present

## 2020-05-12 DIAGNOSIS — F331 Major depressive disorder, recurrent, moderate: Secondary | ICD-10-CM | POA: Diagnosis not present

## 2020-05-12 DIAGNOSIS — F411 Generalized anxiety disorder: Secondary | ICD-10-CM

## 2020-05-12 MED ORDER — CLONAZEPAM 1 MG PO TABS: 1.0000 mg | ORAL_TABLET | Freq: Every day | ORAL | 0 refills | Status: DC

## 2020-05-12 MED ORDER — BENZTROPINE MESYLATE 0.5 MG PO TABS: 0.5000 mg | ORAL_TABLET | Freq: Every day | ORAL | 1 refills | Status: DC

## 2020-05-12 MED ORDER — ARIPIPRAZOLE 10 MG PO TABS: ORAL_TABLET | ORAL | 1 refills | Status: DC

## 2020-05-12 NOTE — Progress Notes (Signed)
[name] [name] [id number] 28-May-1951 69 y.o.  Subjective:   Patient ID:  [name] [name] is a 69 y.o. (DOB 12-05-1950) male.  Chief Complaint: No chief complaint on file.   HPI KOHL POLINSKY presents to the office today for follow-up of Anxiety, Depression, Mood disorder, and Insomnia.  Describes mood today as "ok". Pleasant. Mood symptoms - denies depression, anxiety, and irritability. Stating "everything is going good". Feels like medications have "settled in" with him and are working well. Recently visited daughter in [place] - saw granddaughter for the first time. Stable interest and motivation. Taking medications as prescribed.  Energy levels stable. Active, has a regular exercise routine - 6 days a week.  Enjoys some usual interests and activities. Lives alone. Spending time with dog "Minnie". Getting out most days. Visiting with neighbors.  Appetite adequate. Weight gain 5 pounds - 176 pounds.  Sleeps well most nights. Averages 8 to 10 hours. Focus and concentration stable. Completing tasks. Managing aspects of household. Retired. Denies SI or HI. Denies AH or VH.    Review of Systems:  Review of Systems  Musculoskeletal: Negative for gait problem.  Neurological: Negative for tremors.  Psychiatric/Behavioral:       Please refer to HPI    Medications: I have reviewed the patient's current medications.  Current Outpatient Medications  Medication Sig Dispense Refill   Abatacept (ORENCIA IV) Inject into the vein every 30 (thirty) days.     acetaminophen (TYLENOL) 325 MG tablet Take 650 mg by mouth every 6 (six) hours as needed.     ARIPiprazole (ABILIFY) 10 MG tablet Take one tablet at bedtime. 90 tablet 1   benztropine (COGENTIN) 0.5 MG tablet Take 1 tablet (0.5 mg total) by mouth daily. 90 tablet 1   clonazePAM (KLONOPIN) 1 MG tablet Take 1 tablet (1 mg total) by mouth at bedtime. 30 tablet 2   fluticasone (FLONASE) 50 MCG/ACT nasal spray Place 1 spray into both  nostrils daily. 48 g 4   Fluticasone-Umeclidin-Vilant (TRELEGY ELLIPTA) 100-62.5-25 MCG/INH AEPB Inhale 1 puff then rinse mouth, once daily 180 each 4   guaiFENesin (MUCUS RELIEF ADULT PO) Take by mouth daily.     loratadine (CLARITIN) 10 MG tablet Take 10 mg by mouth daily.     losartan-hydrochlorothiazide (HYZAAR) 100-25 MG tablet Take 1 tablet by mouth daily.      montelukast (SINGULAIR) 10 MG tablet TAKE ONE TABLET BY MOUTH DAILY 90 tablet 2   nystatin (MYCOSTATIN) 100000 UNIT/ML suspension Take 5 mLs by mouth daily.      Omeprazole (PRILOSEC PO) Take by mouth 2 (two) times daily.     PROAIR HFA 108 (90 Base) MCG/ACT inhaler INHALE 1-2 PUFFS EVERY 6 HOURS AS NEEDED FOR WHEEZING OR SHORTNESS OF BREATH 8.5 g 4   Probiotic Product (PROBIOTIC PO) Take by mouth.     No current facility-administered medications for this visit.    Medication Side Effects: None  Allergies:  Allergies  Allergen Reactions   Simponi [Golimumab]     Repeated infections   Aspirin     REACTION: throat swelling   Other Other (See Comments)    REACTION: oral sores   Sulfonamide Derivatives     REACTION: oral sores   Theophyllines     Past Medical History:  Diagnosis Date   ALLERGIC RHINITIS    Asthma    GERD (gastroesophageal reflux disease)    Rheumatoid arthritis(714.0)     Family History  Problem Relation Age of Onset   Multiple  sclerosis Father    Breast cancer Mother    Breast cancer Sister    Alcoholism Child        now sober    Social History   Socioeconomic History   Marital status: Married    Spouse name: Not on file   Number of children: Not on file   Years of education: Not on file   Highest education level: Not on file  Occupational History   Not on file  Tobacco Use   Smoking status: Former Smoker    Packs/day: 1.00    Years: 20.00    Pack years: 20.00    Types: Cigarettes    Quit date: 10/08/1998    Years since quitting: 21.6   Smokeless  tobacco: Former Systems developer    Types: Snuff, Chew    Quit date: 1988  Vaping Use   Vaping Use: Never used  Substance and Sexual Activity   Alcohol use: Not Currently   Drug use: No   Sexual activity: Not on file  Other Topics Concern   Not on file  Social History Narrative   Not on file   Social Determinants of Health   Financial Resource Strain:    Difficulty of Paying Living Expenses:   Food Insecurity:    Worried About Charity fundraiser in the Last Year:    Arboriculturist in the Last Year:   Transportation Needs:    Film/video editor (Medical):    Lack of Transportation (Non-Medical):   Physical Activity:    Days of Exercise per Week:    Minutes of Exercise per Session:   Stress:    Feeling of Stress :   Social Connections:    Frequency of Communication with Friends and Family:    Frequency of Social Gatherings with Friends and Family:    Attends Religious Services:    Active Member of Clubs or Organizations:    Attends Music therapist:    Marital Status:   Intimate Partner Violence:    Fear of Current or Ex-Partner:    Emotionally Abused:    Physically Abused:    Sexually Abused:     Past Medical History, Surgical history, Social history, and Family history were reviewed and updated as appropriate.   Please see review of systems for further details on the patient's review from today.   Objective:   Physical Exam:  There were no vitals taken for this visit.  Physical Exam Constitutional:      General: He is not in acute distress. Musculoskeletal:        General: No deformity.  Neurological:     Mental Status: He is alert and oriented to person, place, and time.     Coordination: Coordination normal.  Psychiatric:        Attention and Perception: Attention and perception normal. He does not perceive auditory or visual hallucinations.        Mood and Affect: Mood normal. Mood is not anxious or depressed. Affect is  not labile, blunt, angry or inappropriate.        Speech: Speech normal.        Behavior: Behavior normal.        Thought Content: Thought content normal. Thought content is not paranoid or delusional. Thought content does not include homicidal or suicidal ideation. Thought content does not include homicidal or suicidal plan.        Cognition and Memory: Cognition and memory normal.  Judgment: Judgment normal.     Comments: Insight intact     Lab Review:     Component Value Date/Time   NA 140 03/28/2020 0906   K 3.9 03/28/2020 0906   CL 105 03/28/2020 0906   CO2 26 03/28/2020 0906   GLUCOSE 108 (H) 03/28/2020 0906   BUN 11 03/28/2020 0906   CREATININE 1.07 03/28/2020 0906   CREATININE 1.10 12/07/2019 0859   CALCIUM 9.4 03/28/2020 0906   PROT 6.7 03/28/2020 0906   ALBUMIN 3.8 03/28/2020 0906   AST 27 03/28/2020 0906   ALT 29 03/28/2020 0906   ALKPHOS 49 03/28/2020 0906   BILITOT 0.4 03/28/2020 0906   GFRNONAA >60 03/28/2020 0906   GFRNONAA 69 12/07/2019 0859   GFRAA >60 03/28/2020 0906   GFRAA 80 12/07/2019 0859       Component Value Date/Time   WBC 5.0 03/28/2020 0906   RBC 4.37 03/28/2020 0906   HGB 13.9 03/28/2020 0906   HCT 43.0 03/28/2020 0906   PLT 242 03/28/2020 0906   MCV 98.4 03/28/2020 0906   MCH 31.8 03/28/2020 0906   MCHC 32.3 03/28/2020 0906   RDW 12.6 03/28/2020 0906   LYMPHSABS 1,826 12/07/2019 0859   MONOABS 1.0 02/13/2018 0951   EOSABS 152 12/07/2019 0859   BASOSABS 51 12/07/2019 0859    Lithium Lvl  Date Value Ref Range Status  05/23/2017 0.7 0.6 - 1.2 mmol/L Final    Comment:    ** Please note change in unit of measure and reference range(s). **     No results found for: PHENYTOIN, PHENOBARB, VALPROATE, CBMZ   .res Assessment: Plan:    Plan:  Abilify 10mg  tablet daily  Clonazepam 1mg  at hs Cogentin 0.5mg  at hs - 1/2 tablet  RTC 3 months  Patient advised to contact office with any questions, adverse effects, or acute  worsening in signs and symptoms.  Discussed potential benefits, risk, and side effects of benzodiazepines to include potential risk of tolerance and dependence, as well as possible drowsiness. Advised patient not to drive if experiencing drowsiness and to take lowest possible effective dose to minimize risk of dependence and tolerance.  Discussed potential metabolic side effects associated with atypical antipsychotics, as well as potential risk for movement side effects. Advised pt to contact office if movement side effects occur.    There are no diagnoses linked to this encounter.   Please see After Visit Summary for patient specific instructions.  Future Appointments  Date Time Provider [place]  05/17/2020  1:45 PM Felipa Furnace, DPM [facility]  05/23/2020  9:00 AM [id number] None  08/09/2020  9:20 AM Ofilia Neas, PA-C [facility] None  11/03/2020  9:30 AM Deneise Lever, MD [facility] None    No orders of the defined types were placed in this encounter.   -------------------------------

## 2020-05-16 DIAGNOSIS — J45909 Unspecified asthma, uncomplicated: Secondary | ICD-10-CM | POA: Diagnosis not present

## 2020-05-16 DIAGNOSIS — E785 Hyperlipidemia, unspecified: Secondary | ICD-10-CM | POA: Diagnosis not present

## 2020-05-16 DIAGNOSIS — M069 Rheumatoid arthritis, unspecified: Secondary | ICD-10-CM | POA: Diagnosis not present

## 2020-05-16 DIAGNOSIS — F329 Major depressive disorder, single episode, unspecified: Secondary | ICD-10-CM | POA: Diagnosis not present

## 2020-05-16 DIAGNOSIS — F319 Bipolar disorder, unspecified: Secondary | ICD-10-CM | POA: Diagnosis not present

## 2020-05-16 DIAGNOSIS — J4520 Mild intermittent asthma, uncomplicated: Secondary | ICD-10-CM | POA: Diagnosis not present

## 2020-05-16 DIAGNOSIS — I10 Essential (primary) hypertension: Secondary | ICD-10-CM | POA: Diagnosis not present

## 2020-05-17 ENCOUNTER — Ambulatory Visit: Payer: Medicare Other | Admitting: Podiatry

## 2020-05-23 ENCOUNTER — Other Ambulatory Visit: Payer: Self-pay

## 2020-05-23 ENCOUNTER — Ambulatory Visit (HOSPITAL_COMMUNITY)
Admission: RE | Admit: 2020-05-23 | Discharge: 2020-05-23 | Disposition: A | Discharge status: Home / Self Care | Payer: Medicare Other | Source: Ambulatory Visit | Attending: Rheumatology | Admitting: Rheumatology

## 2020-05-23 DIAGNOSIS — Z79899 Other long term (current) drug therapy: Secondary | ICD-10-CM | POA: Insufficient documentation

## 2020-05-23 LAB — CBC
HCT: 43.6 % (ref 39.0–52.0)
Hemoglobin: 14.1 g/dL (ref 13.0–17.0)
MCH: 30.8 pg (ref 26.0–34.0)
MCHC: 32.3 g/dL (ref 30.0–36.0)
MCV: 95.2 fL (ref 80.0–100.0)
Platelets: 271 10*3/uL (ref 150–400)
RBC: 4.58 MIL/uL (ref 4.22–5.81)
RDW: 12.9 % (ref 11.5–15.5)
WBC: 4.6 10*3/uL (ref 4.0–10.5)
nRBC: 0 % (ref 0.0–0.2)

## 2020-05-23 LAB — COMPREHENSIVE METABOLIC PANEL
ALT: 32 U/L (ref 0–44)
AST: 24 U/L (ref 15–41)
Albumin: 3.8 g/dL (ref 3.5–5.0)
Alkaline Phosphatase: 56 U/L (ref 38–126)
Anion gap: 11 (ref 5–15)
BUN: 10 mg/dL (ref 8–23)
CO2: 25 mmol/L (ref 22–32)
Calcium: 9.7 mg/dL (ref 8.9–10.3)
Chloride: 104 mmol/L (ref 98–111)
Creatinine, Ser: 1.02 mg/dL (ref 0.61–1.24)
GFR calc Af Amer: >60 mL/min (ref >60)
GFR calc non Af Amer: >60 mL/min (ref >60)
Glucose, Bld: 95 mg/dL (ref 70–99)
Potassium: 4.1 mmol/L (ref 3.5–5.1)
Sodium: 140 mmol/L (ref 135–145)
Total Bilirubin: 0.7 mg/dL (ref 0.3–1.2)
Total Protein: 7.1 g/dL (ref 6.5–8.1)

## 2020-05-23 MED ORDER — DIPHENHYDRAMINE HCL 25 MG PO CAPS
ORAL_CAPSULE | ORAL | Status: AC
  Administered 2020-05-23: 25 mg via ORAL
  Filled 2020-05-23: qty 1

## 2020-05-23 MED ORDER — DIPHENHYDRAMINE HCL 25 MG PO CAPS: 25.0000 mg | ORAL_CAPSULE | ORAL | Status: DC

## 2020-05-23 MED ORDER — SODIUM CHLORIDE 0.9 % IV SOLN
750.0000 mg | INTRAVENOUS | Status: DC
  Administered 2020-05-23: 750 mg via INTRAVENOUS
  Filled 2020-05-23: qty 30

## 2020-05-23 MED ORDER — ACETAMINOPHEN 325 MG PO TABS: 650.0000 mg | ORAL_TABLET | ORAL | Status: DC

## 2020-05-23 NOTE — Progress Notes (Signed)
CBC and CMP are normal.

## 2020-05-31 ENCOUNTER — Ambulatory Visit (INDEPENDENT_AMBULATORY_CARE_PROVIDER_SITE_OTHER): Payer: Medicare Other | Admitting: Podiatry

## 2020-05-31 ENCOUNTER — Other Ambulatory Visit: Payer: Self-pay | Admitting: Pharmacist

## 2020-05-31 ENCOUNTER — Encounter: Payer: Self-pay | Admitting: Podiatry

## 2020-05-31 ENCOUNTER — Other Ambulatory Visit: Payer: Self-pay

## 2020-05-31 DIAGNOSIS — Z79899 Other long term (current) drug therapy: Secondary | ICD-10-CM

## 2020-05-31 DIAGNOSIS — Q828 Other specified congenital malformations of skin: Secondary | ICD-10-CM

## 2020-05-31 DIAGNOSIS — M0609 Rheumatoid arthritis without rheumatoid factor, multiple sites: Secondary | ICD-10-CM

## 2020-05-31 NOTE — Progress Notes (Signed)
Last infusion for Orencia was on 05/23/20 and due for updated orders prior to next infusion due on 07/18/20.  Last Visit: 03/08/20 Next Visit: 08/09/20 Labs: WNL 05/23/20 TB Gold: negative 08/12/2019  Orders placed for Orencia 750 mg every 8 weeks(spaced out due to recurrent infection) x 2 doses along with premedication of Tylenol and Benadryl.  Standing CBC/CMP orders placed.   Mariella Saa, PharmD, [place], CPP Clinical Specialty Pharmacist (Rheumatology and Pulmonology)  05/31/2020 11:36 AM

## 2020-06-01 ENCOUNTER — Encounter: Payer: Self-pay | Admitting: Podiatry

## 2020-06-01 NOTE — Progress Notes (Signed)
Subjective:  Patient ID: [name] [name], male    DOB: 03-26-1951,  MRN: [id number]  Chief Complaint  Patient presents with  . Callouses    follow up porokeratosis left foot   needs trimming again    69 y.o. male presents with the above complaint.  Patient presents in follow-up of left submetatarsal 5 porokeratosis.  Patient states he no longer has any pain.  He is able to return to his normal activities without any pain.  The debridement of the lesion that I did last week has helped considerably.  He denies any other acute complaints.   Review of Systems: Negative except as noted in the HPI. Denies N/V/F/Ch.  Past Medical History:  Diagnosis Date  . ALLERGIC RHINITIS   . Asthma   . GERD (gastroesophageal reflux disease)   . Rheumatoid arthritis(714.0)     Current Outpatient Medications:  .  Abatacept (ORENCIA IV), Inject into the vein every 30 (thirty) days., Disp: , Rfl:  .  acetaminophen (TYLENOL) 325 MG tablet, Take 650 mg by mouth every 6 (six) hours as needed., Disp: , Rfl:  .  ARIPiprazole (ABILIFY) 10 MG tablet, Take one tablet at bedtime., Disp: 90 tablet, Rfl: 1 .  benztropine (COGENTIN) 0.5 MG tablet, Take 1 tablet (0.5 mg total) by mouth daily., Disp: 90 tablet, Rfl: 1 .  clonazePAM (KLONOPIN) 1 MG tablet, Take 1 tablet (1 mg total) by mouth at bedtime., Disp: 90 tablet, Rfl: 0 .  fluticasone (FLONASE) 50 MCG/ACT nasal spray, Place 1 spray into both nostrils daily., Disp: 48 g, Rfl: 4 .  Fluticasone-Umeclidin-Vilant (TRELEGY ELLIPTA) 100-62.5-25 MCG/INH AEPB, Inhale 1 puff then rinse mouth, once daily, Disp: 180 each, Rfl: 4 .  guaiFENesin (MUCUS RELIEF ADULT PO), Take by mouth daily., Disp: , Rfl:  .  loratadine (CLARITIN) 10 MG tablet, Take 10 mg by mouth daily., Disp: , Rfl:  .  losartan-hydrochlorothiazide (HYZAAR) 100-25 MG tablet, Take 1 tablet by mouth daily. , Disp: , Rfl:  .  montelukast (SINGULAIR) 10 MG tablet, TAKE ONE TABLET BY MOUTH DAILY, Disp: 90 tablet,  Rfl: 2 .  nystatin (MYCOSTATIN) 100000 UNIT/ML suspension, Take 5 mLs by mouth daily. , Disp: , Rfl:  .  Omeprazole (PRILOSEC PO), Take by mouth 2 (two) times daily., Disp: , Rfl:  .  PROAIR HFA 108 (90 Base) MCG/ACT inhaler, INHALE 1-2 PUFFS EVERY 6 HOURS AS NEEDED FOR WHEEZING OR SHORTNESS OF BREATH, Disp: 8.5 g, Rfl: 4 .  Probiotic Product (PROBIOTIC PO), Take by mouth., Disp: , Rfl:   Social History   Tobacco Use  Smoking Status Former Smoker  . Packs/day: 1.00  . Years: 20.00  . Pack years: 20.00  . Types: Cigarettes  . Quit date: 10/08/1998  . Years since quitting: 21.6  Smokeless Tobacco Former Systems developer  . Types: Snuff, Chew  . Quit date: 1988    Allergies  Allergen Reactions  . Simponi [Golimumab]     Repeated infections  . Aspirin     REACTION: throat swelling  . Other Other (See Comments)    REACTION: oral sores  . Sulfonamide Derivatives     REACTION: oral sores  . Theophyllines    Objective:  There were no vitals filed for this visit. There is no height or weight on file to calculate BMI. Constitutional Well developed. Well nourished.  Vascular Dorsalis pedis pulses palpable bilaterally. Posterior tibial pulses palpable bilaterally. Capillary refill normal to all digits.  No cyanosis or clubbing noted. Pedal hair growth  normal.  Neurologic Normal speech. Oriented to person, place, and time. Epicritic sensation to light touch grossly present bilaterally.  Dermatologic Nails well groomed and normal in appearance. No open wounds. No skin lesions.  Orthopedic:  No pain on palpation to the left submetatarsal 5 hyperkeratotic lesion with a central nucleated core.  Plantarflexed metatarsal noted.  Mild tailor's bunion noted.   Radiographs:  Assessment:   1. Porokeratosis    Plan:  Patient was evaluated and treated and all questions answered.  Left submetatarsal 5 porokeratosis -I explained to patient the etiology of porokeratosis and various treatment  options were discussed.  Given the architecture of his foot he is putting a lot of pressure to the lateral fifth metatarsal head and therefore putting excessive pressure on the porokeratosis leading to a lot of pain. -At this time patient has clinically resolved in terms of pain as well as the lesion.  If patient continues to have any kind of pain or the pain recurs with the lesion I will discuss orthotics management as well as a steroid injection during that time. -I primarily discussed with the patient today the importance of shoe gear modification.  Patient states understanding.  No follow-ups on file.

## 2020-06-08 DIAGNOSIS — F319 Bipolar disorder, unspecified: Secondary | ICD-10-CM | POA: Diagnosis not present

## 2020-06-08 DIAGNOSIS — J4520 Mild intermittent asthma, uncomplicated: Secondary | ICD-10-CM | POA: Diagnosis not present

## 2020-06-08 DIAGNOSIS — M069 Rheumatoid arthritis, unspecified: Secondary | ICD-10-CM | POA: Diagnosis not present

## 2020-06-08 DIAGNOSIS — F410 Panic disorder [episodic paroxysmal anxiety] without agoraphobia: Secondary | ICD-10-CM | POA: Diagnosis not present

## 2020-06-08 DIAGNOSIS — Z125 Encounter for screening for malignant neoplasm of prostate: Secondary | ICD-10-CM | POA: Diagnosis not present

## 2020-06-08 DIAGNOSIS — I10 Essential (primary) hypertension: Secondary | ICD-10-CM | POA: Diagnosis not present

## 2020-06-08 DIAGNOSIS — F329 Major depressive disorder, single episode, unspecified: Secondary | ICD-10-CM | POA: Diagnosis not present

## 2020-06-08 DIAGNOSIS — E785 Hyperlipidemia, unspecified: Secondary | ICD-10-CM | POA: Diagnosis not present

## 2020-06-08 DIAGNOSIS — K219 Gastro-esophageal reflux disease without esophagitis: Secondary | ICD-10-CM | POA: Diagnosis not present

## 2020-06-08 DIAGNOSIS — Z0000 Encounter for general adult medical examination without abnormal findings: Secondary | ICD-10-CM | POA: Diagnosis not present

## 2020-07-03 ENCOUNTER — Other Ambulatory Visit: Payer: Self-pay | Admitting: Internal Medicine

## 2020-07-03 DIAGNOSIS — Z23 Encounter for immunization: Secondary | ICD-10-CM | POA: Diagnosis not present

## 2020-07-05 ENCOUNTER — Other Ambulatory Visit: Payer: Self-pay | Admitting: Internal Medicine

## 2020-07-06 ENCOUNTER — Other Ambulatory Visit: Payer: Self-pay | Admitting: Internal Medicine

## 2020-07-11 DIAGNOSIS — F319 Bipolar disorder, unspecified: Secondary | ICD-10-CM | POA: Diagnosis not present

## 2020-07-11 DIAGNOSIS — M069 Rheumatoid arthritis, unspecified: Secondary | ICD-10-CM | POA: Diagnosis not present

## 2020-07-11 DIAGNOSIS — I10 Essential (primary) hypertension: Secondary | ICD-10-CM | POA: Diagnosis not present

## 2020-07-11 DIAGNOSIS — F329 Major depressive disorder, single episode, unspecified: Secondary | ICD-10-CM | POA: Diagnosis not present

## 2020-07-11 DIAGNOSIS — J45909 Unspecified asthma, uncomplicated: Secondary | ICD-10-CM | POA: Diagnosis not present

## 2020-07-11 DIAGNOSIS — E785 Hyperlipidemia, unspecified: Secondary | ICD-10-CM | POA: Diagnosis not present

## 2020-07-11 DIAGNOSIS — J4520 Mild intermittent asthma, uncomplicated: Secondary | ICD-10-CM | POA: Diagnosis not present

## 2020-07-12 ENCOUNTER — Other Ambulatory Visit: Payer: Self-pay | Admitting: Internal Medicine

## 2020-07-12 DIAGNOSIS — R413 Other amnesia: Secondary | ICD-10-CM | POA: Diagnosis not present

## 2020-07-12 DIAGNOSIS — R269 Unspecified abnormalities of gait and mobility: Secondary | ICD-10-CM | POA: Diagnosis not present

## 2020-07-18 ENCOUNTER — Ambulatory Visit (HOSPITAL_COMMUNITY): Payer: Medicare Other

## 2020-07-25 ENCOUNTER — Other Ambulatory Visit: Payer: Self-pay

## 2020-07-25 ENCOUNTER — Ambulatory Visit (HOSPITAL_COMMUNITY)
Admission: RE | Admit: 2020-07-25 | Discharge: 2020-07-25 | Disposition: A | Discharge status: Home / Self Care | Payer: Medicare Other | Source: Ambulatory Visit | Attending: Rheumatology | Admitting: Rheumatology

## 2020-07-25 DIAGNOSIS — Z79899 Other long term (current) drug therapy: Secondary | ICD-10-CM | POA: Insufficient documentation

## 2020-07-25 DIAGNOSIS — M0609 Rheumatoid arthritis without rheumatoid factor, multiple sites: Secondary | ICD-10-CM | POA: Insufficient documentation

## 2020-07-25 LAB — COMPREHENSIVE METABOLIC PANEL
ALT: 24 U/L (ref 0–44)
AST: 24 U/L (ref 15–41)
Albumin: 4.1 g/dL (ref 3.5–5.0)
Alkaline Phosphatase: 54 U/L (ref 38–126)
Anion gap: 11 (ref 5–15)
BUN: 18 mg/dL (ref 8–23)
CO2: 26 mmol/L (ref 22–32)
Calcium: 9.7 mg/dL (ref 8.9–10.3)
Chloride: 103 mmol/L (ref 98–111)
Creatinine, Ser: 1.15 mg/dL (ref 0.61–1.24)
GFR, Estimated: >60 mL/min (ref >60)
Glucose, Bld: 105 mg/dL — ABNORMAL HIGH (ref 70–99)
Potassium: 3.9 mmol/L (ref 3.5–5.1)
Sodium: 140 mmol/L (ref 135–145)
Total Bilirubin: 0.4 mg/dL (ref 0.3–1.2)
Total Protein: 7.2 g/dL (ref 6.5–8.1)

## 2020-07-25 LAB — CBC WITH DIFFERENTIAL/PLATELET
Abs Immature Granulocytes: 0.02 10*3/uL (ref 0.00–0.07)
Basophils Absolute: 0.1 10*3/uL (ref 0.0–0.1)
Basophils Relative: 1 %
Eosinophils Absolute: 0.1 10*3/uL (ref 0.0–0.5)
Eosinophils Relative: 2 %
HCT: 42.6 % (ref 39.0–52.0)
Hemoglobin: 14.1 g/dL (ref 13.0–17.0)
Immature Granulocytes: 0 %
Lymphocytes Relative: 33 %
Lymphs Abs: 1.7 10*3/uL (ref 0.7–4.0)
MCH: 31.3 pg (ref 26.0–34.0)
MCHC: 33.1 g/dL (ref 30.0–36.0)
MCV: 94.7 fL (ref 80.0–100.0)
Monocytes Absolute: 0.6 10*3/uL (ref 0.1–1.0)
Monocytes Relative: 11 %
Neutro Abs: 2.8 10*3/uL (ref 1.7–7.7)
Neutrophils Relative %: 53 %
Platelets: 268 10*3/uL (ref 150–400)
RBC: 4.5 MIL/uL (ref 4.22–5.81)
RDW: 13.2 % (ref 11.5–15.5)
WBC: 5.3 10*3/uL (ref 4.0–10.5)
nRBC: 0 % (ref 0.0–0.2)

## 2020-07-25 MED ORDER — SODIUM CHLORIDE 0.9 % IV SOLN
750.0000 mg | INTRAVENOUS | Status: DC
  Administered 2020-07-25: 750 mg via INTRAVENOUS
  Filled 2020-07-25: qty 30

## 2020-07-25 MED ORDER — ACETAMINOPHEN 325 MG PO TABS: 650.0000 mg | ORAL_TABLET | Freq: Once | ORAL | Status: AC

## 2020-07-25 MED ORDER — ACETAMINOPHEN 325 MG PO TABS
ORAL_TABLET | ORAL | Status: AC
  Administered 2020-07-25: 650 mg via ORAL
  Filled 2020-07-25: qty 2

## 2020-07-25 MED ORDER — DIPHENHYDRAMINE HCL 25 MG PO CAPS: 25.0000 mg | ORAL_CAPSULE | Freq: Once | ORAL | Status: AC

## 2020-07-25 MED ORDER — DIPHENHYDRAMINE HCL 25 MG PO CAPS
ORAL_CAPSULE | ORAL | Status: AC
  Administered 2020-07-25: 25 mg via ORAL
  Filled 2020-07-25: qty 1

## 2020-07-26 ENCOUNTER — Ambulatory Visit
Admission: RE | Admit: 2020-07-26 | Discharge: 2020-07-26 | Disposition: A | Discharge status: Home / Self Care | Payer: Medicare Other | Source: Ambulatory Visit | Attending: Internal Medicine | Admitting: Internal Medicine

## 2020-07-26 ENCOUNTER — Other Ambulatory Visit: Payer: Self-pay

## 2020-07-26 DIAGNOSIS — R413 Other amnesia: Secondary | ICD-10-CM

## 2020-07-26 DIAGNOSIS — R269 Unspecified abnormalities of gait and mobility: Secondary | ICD-10-CM

## 2020-07-26 NOTE — Progress Notes (Signed)
Office Visit Note  Patient: [name] [name]             Date of Birth: 1950/10/14           MRN: [id number]             PCP: Wenda Low, MD Referring: Wenda Low, MD Visit Date: 08/09/2020 Occupation: @GUAROCC @  Subjective:  Medication monitoring   History of Present Illness: [name] [name] is a 69 y.o. male with history of seronegative rheumatoid arthritis and osteoarthritis.  Patient is on Orencia 750 mg IV infusions every 8 weeks.  His most recent infusion was on 07/25/20.  He denies any recent rheumatoid arthritis flares.  He is not experiencing any pain or joint swelling at this time.  He continues to have morning stiffness lasting about 30 minutes.  He has noticed some decreased grip strength in both hands.  He continues to workout on a daily basis without difficulty.  He states that he was followed by Dr. Posey Pronto for porokeratosis of the left foot and underwent debridement without complication.   He denies any recent infections.  He has received the COVID-19 booster as well as the annual influenza vaccine. He reports that he continues to have headaches on a daily basis and establishing care with a neurologist in a couple of weeks.  Activities of Daily Living:  Patient reports morning stiffness for 30 minutes.   Patient Reports nocturnal pain.  Difficulty dressing/grooming: Reports Difficulty climbing stairs: Denies Difficulty getting out of chair: Denies Difficulty using hands for taps, buttons, cutlery, and/or writing: Reports  Review of Systems  Constitutional: Positive for fatigue. Negative for night sweats.  HENT: Positive for mouth dryness. Negative for mouth sores and nose dryness.   Eyes: Negative for redness and dryness.  Respiratory: Negative for difficulty breathing.   Cardiovascular: Negative for chest pain, palpitations, hypertension, irregular heartbeat and swelling in legs/feet.  Gastrointestinal: Positive for diarrhea. Negative for constipation.    Endocrine: Negative for increased urination.  Genitourinary: Negative for difficulty urinating and painful urination.  Musculoskeletal: Positive for morning stiffness. Negative for arthralgias, joint pain, joint swelling, myalgias, muscle weakness, muscle tenderness and myalgias.  Skin: Negative for color change, rash, hair loss, nodules/bumps, skin tightness, ulcers and sensitivity to sunlight.  Allergic/Immunologic: Negative for susceptible to infections.  Neurological: Negative for dizziness, fainting, numbness, memory loss, night sweats and weakness.  Hematological: Positive for bruising/bleeding tendency. Negative for swollen glands.  Psychiatric/Behavioral: Negative for depressed mood and sleep disturbance. The patient is not nervous/anxious.     PMFS History:  Patient Active Problem List   Diagnosis Date Noted  . Coarse tremors 12/29/2019  . Diplopia 12/29/2019  . Esotropia, intermittent 12/29/2019  . Regular astigmatism of both eyes 12/29/2019  . History of gastroesophageal reflux (GERD) 05/29/2018  . High risk medication use 05/29/2018  . History of IBS 05/29/2018  . History of depression 07/15/2017  . History of asthma 07/08/2017  . 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 Asthmatic bronchitis, mild intermittent, uncomplicated 04/02/9484  . Asthmatic bronchitis with acute exacerbation 10/15/2016  . Herpes zoster 08/01/2013  . Rheumatoid arthritis of multiple sites without rheumatoid factor ([place]) 09/18/2010  . NASAL POLYP 12/24/2007  . Sinusitis, chronic 12/24/2007  . Seasonal and perennial allergic rhinitis 12/24/2007  . Allergic-infective asthma 12/24/2007  . G E R D 12/24/2007    Past Medical History:  Diagnosis Date  . ALLERGIC RHINITIS   . Asthma   . GERD (gastroesophageal reflux disease)   .  Rheumatoid arthritis(714.0)     Family History  Problem Relation Age of Onset  . Multiple sclerosis Father   . Breast cancer Mother   . Breast cancer  Sister   . Alcoholism Child        now sober   Past Surgical History:  Procedure Laterality Date  . CARPAL TUNNEL RELEASE Left   . extensive sinus surgery with ablation of the frontal sinuses    . HERNIA REPAIR    . MOUTH SURGERY  11/2019  . nasal polypectomies    . TOOTH EXTRACTION     Social History   Social History Narrative  . Not on file   Immunization History  Administered Date(s) Administered  . Influenza Split 07/08/2012, 07/06/2013, 07/08/2016  . Influenza, High Dose Seasonal PF 06/13/2018, 08/24/2019  . Influenza,inj,Quad PF,6+ Mos 07/15/2014, 06/19/2017  . PFIZER SARS-COV-2 Vaccination 11/20/2019, 12/15/2019, 06/17/2020  . Pneumococcal Polysaccharide-23 07/15/2014  . Zoster Recombinat (Shingrix) 05/23/2018, 08/21/2018     Objective: Vital Signs: BP 140/82 (BP Location: Left Arm, Patient Position: Sitting, Cuff Size: Normal)   Pulse (!) 58   Resp 16   Ht 5\' 8"  (1.727 m)   Wt 187 lb (84.8 kg)   BMI 28.43 kg/m    Physical Exam Vitals and nursing note reviewed.  Constitutional:      Appearance: He is well-developed.  HENT:     Head: Normocephalic and atraumatic.  Eyes:     Conjunctiva/sclera: Conjunctivae normal.     Pupils: Pupils are equal, round, and reactive to light.  Pulmonary:     Effort: Pulmonary effort is normal.  Abdominal:     Palpations: Abdomen is soft.  Musculoskeletal:     Cervical back: Normal range of motion and neck supple.  Skin:    General: Skin is warm and dry.     Capillary Refill: Capillary refill takes less than 2 seconds.  Neurological:     Mental Status: He is alert and oriented to person, place, and time.  Psychiatric:        Behavior: Behavior normal.      Musculoskeletal Exam: C-spine, thoracic spine, and lumbar spine good ROM.  Shoulder joints good ROM with no discomfort.  Mild right elbow joint contracture.  Limited ROM of both wrist joints. Synovial thickening of MCP joints but no tenderness or synovitis.  Ulnar  deviation noted.  Hip joints, knee joints, ankle joints good ROM with no discomfort.  No warmth or effusion of knee joints. No tenderness or swelling of ankle joints.   CDAI Exam: CDAI Score: 0.2  Patient Global: 1 mm; Provider Global: 1 mm Swollen: 0 ; Tender: 0  Joint Exam 08/09/2020   No joint exam has been documented for this visit   There is currently no information documented on the homunculus. Go to the Rheumatology activity and complete the homunculus joint exam.  Investigation: No additional findings.  Imaging: CT HEAD WO CONTRAST  Result Date: 07/26/2020 CLINICAL DATA:  Memory loss and confusion EXAM: CT HEAD WITHOUT CONTRAST TECHNIQUE: Contiguous axial images were obtained from the base of the skull through the vertex without intravenous contrast. COMPARISON:  MRI from 11/11/2011 FINDINGS: Brain: No evidence of acute infarction, hemorrhage, hydrocephalus, extra-axial collection or mass lesion/mass effect. Vascular: No hyperdense vessel or unexpected calcification. Skull: Normal. Negative for fracture or focal lesion. Sinuses/Orbits: Postsurgical changes in the maxillary antra are noted bilaterally. Other: None IMPRESSION: No acute intracranial abnormality noted. Electronically Signed   By: Inez Catalina M.D.   On:  07/26/2020 20:23    Recent Labs: Lab Results  Component Value Date   WBC 5.3 07/25/2020   HGB 14.1 07/25/2020   PLT 268 07/25/2020   NA 140 07/25/2020   K 3.9 07/25/2020   CL 103 07/25/2020   CO2 26 07/25/2020   GLUCOSE 105 (H) 07/25/2020   BUN 18 07/25/2020   CREATININE 1.15 07/25/2020   BILITOT 0.4 07/25/2020   ALKPHOS 54 07/25/2020   AST 24 07/25/2020   ALT 24 07/25/2020   PROT 7.2 07/25/2020   ALBUMIN 4.1 07/25/2020   CALCIUM 9.7 07/25/2020   GFRAA >60 05/23/2020   QFTBGOLD NEGATIVE 07/16/2017   QFTBGOLDPLUS NEGATIVE 08/12/2019    Speciality Comments: No specialty comments available.  Procedures:  No procedures performed Allergies: Simponi  [golimumab], Aspirin, Other, Sulfonamide derivatives, and Theophyllines   Assessment / Plan:     Visit Diagnoses: Rheumatoid arthritis of multiple sites without rheumatoid factor [facility]): He has no joint tenderness or synovitis on exam.  He has not had any recent rheumatoid arthritis flares.  He is clinically doing well on Orencia 750 mg IV infusions every 8 weeks.  He is spacing the dosing of orencia due to recurrent infections in the past. His most recent infusion was on 07/25/20.  He is not experiencing any joint pain or joint inflammation at this time.  He continues to have morning stiffness lasting about 30 minutes daily.  He continues to work out 6 days a week without difficulty.  He will continue on orencia 750 mg IV infusions every 8 weeks. He was advised to notify us if he develops increased joint pain or joint swelling.  He will follow up in 5 months.   High risk medication use - Orencia 750 mg IV infusions every 8 weeks-spacing orencia due to recurrent infections.  CBC and CMP were drawn on 07/25/2020 and results were reviewed with the patient today in the office.  He will continue to have lab work drawn with his infusions every 8 weeks.  TB gold was negative on 08/12/2019.  Order for TB gold was released today.- Plan: QuantiFERON-TB Gold Plus He has not had any recent infections.  He has received the annual influenza vaccine as well as the COVID-19 booster.  We discussed the importance of continue to wear a mask and social distance.  He was advised to notify us or his PCP if he develops a COVID-19 infection in order to receive the monoclonal antibody infusion.  He voiced understanding.  Primary osteoarthritis of both hands: He has PIP and DIP thickening consistent with osteoarthritis of both hands.  No tenderness or inflammation was noted.  He is able to make a complete fist bilaterally.  He has been noticing decreased grip strength recently.  Joint protection and muscle strengthening were  discussed.  He was given a handout of hand exercises to perform.  Trochanteric bursitis, left hip - He has tenderness to palpation on exam today.  He sleeps on his sides at night which exacerbate his discomfort.  His discomfort has been intermittent and mild.  Other medical conditions are listed as follows:  Coarse tremors  History of gastroesophageal reflux (GERD)  History of depression  History of IBS  History of asthma  Family history of MS (multiple sclerosis)  Orders: Orders Placed This Encounter  Procedures  . QuantiFERON-TB Gold Plus   No orders of the defined types were placed in this encounter.     Follow-Up Instructions: Return in about 5 months (around 01/07/2021) for Rheumatoid  arthritis, Osteoarthritis.   Ofilia Neas, PA-C  Note - This record has been created using Dragon software.  Chart creation errors have been sought, but may not always  have been located. Such creation errors do not reflect on  the standard of medical care.

## 2020-08-01 ENCOUNTER — Ambulatory Visit: Payer: Medicare Other

## 2020-08-09 ENCOUNTER — Encounter: Payer: Self-pay | Admitting: Physician Assistant

## 2020-08-09 ENCOUNTER — Other Ambulatory Visit: Payer: Self-pay

## 2020-08-09 ENCOUNTER — Ambulatory Visit (INDEPENDENT_AMBULATORY_CARE_PROVIDER_SITE_OTHER): Payer: Medicare Other | Admitting: Physician Assistant

## 2020-08-09 VITALS — BP 140/82 | HR 58 | Resp 16 | Ht 68.0 in | Wt 187.0 lb

## 2020-08-09 DIAGNOSIS — Z820 Family history of epilepsy and other diseases of the nervous system: Secondary | ICD-10-CM

## 2020-08-09 DIAGNOSIS — Z8709 Personal history of other diseases of the respiratory system: Secondary | ICD-10-CM

## 2020-08-09 DIAGNOSIS — M0609 Rheumatoid arthritis without rheumatoid factor, multiple sites: Secondary | ICD-10-CM | POA: Diagnosis not present

## 2020-08-09 DIAGNOSIS — Z8659 Personal history of other mental and behavioral disorders: Secondary | ICD-10-CM | POA: Diagnosis not present

## 2020-08-09 DIAGNOSIS — Z79899 Other long term (current) drug therapy: Secondary | ICD-10-CM | POA: Diagnosis not present

## 2020-08-09 DIAGNOSIS — G252 Other specified forms of tremor: Secondary | ICD-10-CM

## 2020-08-09 DIAGNOSIS — M19042 Primary osteoarthritis, left hand: Secondary | ICD-10-CM

## 2020-08-09 DIAGNOSIS — M19041 Primary osteoarthritis, right hand: Secondary | ICD-10-CM

## 2020-08-09 DIAGNOSIS — Z8719 Personal history of other diseases of the digestive system: Secondary | ICD-10-CM

## 2020-08-09 DIAGNOSIS — M7062 Trochanteric bursitis, left hip: Secondary | ICD-10-CM | POA: Diagnosis not present

## 2020-08-09 NOTE — Patient Instructions (Signed)
Hand Exercises Hand exercises can be helpful for almost anyone. These exercises can strengthen the hands, improve flexibility and movement, and increase blood flow to the hands. These results can make work and daily tasks easier. Hand exercises can be especially helpful for people who have joint pain from arthritis or have nerve damage from overuse (carpal tunnel syndrome). These exercises can also help people who have injured a hand. Exercises Most of these hand exercises are gentle stretching and motion exercises. It is usually safe to do them often throughout the day. Warming up your hands before exercise may help to reduce stiffness. You can do this with gentle massage or by placing your hands in warm water for 10-15 minutes. It is normal to feel some stretching, pulling, tightness, or mild discomfort as you begin new exercises. This will gradually improve. Stop an exercise right away if you feel sudden, severe pain or your pain gets worse. Ask your health care provider which exercises are best for you. Knuckle bend or "claw" fist 1. Stand or sit with your arm, hand, and all five fingers pointed straight up. Make sure to keep your wrist straight during the exercise. 2. Gently bend your fingers down toward your palm until the tips of your fingers are touching the top of your palm. Keep your big knuckle straight and just bend the small knuckles in your fingers. 3. Hold this position for __________ seconds. 4. Straighten (extend) your fingers back to the starting position. Repeat this exercise 5-10 times with each hand. Full finger fist 1. Stand or sit with your arm, hand, and all five fingers pointed straight up. Make sure to keep your wrist straight during the exercise. 2. Gently bend your fingers into your palm until the tips of your fingers are touching the middle of your palm. 3. Hold this position for __________ seconds. 4. Extend your fingers back to the starting position, stretching every  joint fully. Repeat this exercise 5-10 times with each hand. Straight fist 1. Stand or sit with your arm, hand, and all five fingers pointed straight up. Make sure to keep your wrist straight during the exercise. 2. Gently bend your fingers at the big knuckle, where your fingers meet your hand, and the middle knuckle. Keep the knuckle at the tips of your fingers straight and try to touch the bottom of your palm. 3. Hold this position for __________ seconds. 4. Extend your fingers back to the starting position, stretching every joint fully. Repeat this exercise 5-10 times with each hand. Tabletop 1. Stand or sit with your arm, hand, and all five fingers pointed straight up. Make sure to keep your wrist straight during the exercise. 2. Gently bend your fingers at the big knuckle, where your fingers meet your hand, as far down as you can while keeping the small knuckles in your fingers straight. Think of forming a tabletop with your fingers. 3. Hold this position for __________ seconds. 4. Extend your fingers back to the starting position, stretching every joint fully. Repeat this exercise 5-10 times with each hand. Finger spread 1. Place your hand flat on a table with your palm facing down. Make sure your wrist stays straight as you do this exercise. 2. Spread your fingers and thumb apart from each other as far as you can until you feel a gentle stretch. Hold this position for __________ seconds. 3. Bring your fingers and thumb tight together again. Hold this position for __________ seconds. Repeat this exercise 5-10 times with each hand.   Making circles 1. Stand or sit with your arm, hand, and all five fingers pointed straight up. Make sure to keep your wrist straight during the exercise. 2. Make a circle by touching the tip of your thumb to the tip of your index finger. 3. Hold for __________ seconds. Then open your hand wide. 4. Repeat this motion with your thumb and each finger on your  hand. Repeat this exercise 5-10 times with each hand. Thumb motion 1. Sit with your forearm resting on a table and your wrist straight. Your thumb should be facing up toward the ceiling. Keep your fingers relaxed as you move your thumb. 2. Lift your thumb up as high as you can toward the ceiling. Hold for __________ seconds. 3. Bend your thumb across your palm as far as you can, reaching the tip of your thumb for the small finger (pinkie) side of your palm. Hold for __________ seconds. Repeat this exercise 5-10 times with each hand. Grip strengthening  1. Hold a stress ball or other soft ball in the middle of your hand. 2. Slowly increase the pressure, squeezing the ball as much as you can without causing pain. Think of bringing the tips of your fingers into the middle of your palm. All of your finger joints should bend when doing this exercise. 3. Hold your squeeze for __________ seconds, then relax. Repeat this exercise 5-10 times with each hand. Contact a health care provider if:  Your hand pain or discomfort gets much worse when you do an exercise.  Your hand pain or discomfort does not improve within 2 hours after you exercise. If you have any of these problems, stop doing these exercises right away. Do not do them again unless your health care provider says that you can. Get help right away if:  You develop sudden, severe hand pain or swelling. If this happens, stop doing these exercises right away. Do not do them again unless your health care provider says that you can. This information is not intended to replace advice given to you by your health care provider. Make sure you discuss any questions you have with your health care provider. Document Revised: 01/15/2019 Document Reviewed: 09/25/2018 [employer] Patient Education  2020 [employer]  

## 2020-08-11 ENCOUNTER — Ambulatory Visit (INDEPENDENT_AMBULATORY_CARE_PROVIDER_SITE_OTHER): Payer: Medicare Other | Admitting: Adult Health

## 2020-08-11 ENCOUNTER — Other Ambulatory Visit: Payer: Self-pay

## 2020-08-11 ENCOUNTER — Encounter: Payer: Self-pay | Admitting: Adult Health

## 2020-08-11 DIAGNOSIS — F411 Generalized anxiety disorder: Secondary | ICD-10-CM | POA: Diagnosis not present

## 2020-08-11 DIAGNOSIS — F39 Unspecified mood [affective] disorder: Secondary | ICD-10-CM | POA: Diagnosis not present

## 2020-08-11 DIAGNOSIS — F331 Major depressive disorder, recurrent, moderate: Secondary | ICD-10-CM

## 2020-08-11 DIAGNOSIS — G4700 Insomnia, unspecified: Secondary | ICD-10-CM

## 2020-08-11 MED ORDER — ARIPIPRAZOLE 10 MG PO TABS: ORAL_TABLET | ORAL | 1 refills | Status: DC

## 2020-08-11 MED ORDER — BENZTROPINE MESYLATE 0.5 MG PO TABS: 0.5000 mg | ORAL_TABLET | Freq: Every day | ORAL | 1 refills | Status: DC

## 2020-08-11 MED ORDER — CLONAZEPAM 1 MG PO TABS: 1.0000 mg | ORAL_TABLET | Freq: Every day | ORAL | 0 refills | Status: DC

## 2020-08-11 NOTE — Progress Notes (Signed)
[name] [name] [id number] [birth year] 69 y.o.  Subjective:   Patient ID:  [name] [name] is a 68 y.o. (DOB 1951/01/26) male.  Chief Complaint: No chief complaint on file.   HPI [name] [name] presents to the office today for follow-up of Anxiety, Depression, Mood disorder, and Insomnia.  Describes mood today as "ok". Pleasant. Mood symptoms - denies depression and irritability. Feels more anxious lately. Stating "I'm a little stressed". Has been forgetful lately  Daughter said "I wasn't as quick as I once was". Not feeling as "sharp". Driving places and forgetting how to get there. Using GPS to get to new places. Spoke with PCP and was referred to Dr.Tatt for evaluation. Stable interest and motivation. Taking medications as prescribed.  Energy levels stable. Active, has a regular exercise routine - 6 days a week.  Enjoys some usual interests and activities. Lives alone. Spending time with dog "Minnie". Getting out most days. Visiting with neighbors.  Appetite adequate. Weight gain 3 pounds - 179 pounds.  Sleeps well most nights. Averages 8 to 10 hours. Focus and concentration stable. Completing tasks. Managing aspects of household. Retired. Denies SI or HI. Denies AH or VH.   Review of Systems:  Review of Systems  Musculoskeletal: Negative for gait problem.  Neurological: Negative for tremors.  Psychiatric/Behavioral:       Please refer to HPI    Medications: I have reviewed the patient's current medications.  Current Outpatient Medications  Medication Sig Dispense Refill   Abatacept (ORENCIA IV) Inject into the vein every 30 (thirty) days.     acetaminophen (TYLENOL) 325 MG tablet Take 650 mg by mouth every 6 (six) hours as needed.     ARIPiprazole (ABILIFY) 10 MG tablet Take one tablet at bedtime. 90 tablet 1   benztropine (COGENTIN) 0.5 MG tablet Take 1 tablet (0.5 mg total) by mouth daily. 90 tablet 1   cholestyramine (QUESTRAN) 4 GM/DOSE powder Take by mouth 3  (three) times daily as needed.     clonazePAM (KLONOPIN) 1 MG tablet Take 1 tablet (1 mg total) by mouth at bedtime. 90 tablet 0   fluticasone (FLONASE) 50 MCG/ACT nasal spray Place 1 spray into both nostrils daily. 48 g 4   Fluticasone-Umeclidin-Vilant (TRELEGY ELLIPTA) 100-62.5-25 MCG/INH AEPB INHALE ONE PUFF BY MOUTH DAILY 60 each 5   guaiFENesin (MUCUS RELIEF ADULT PO) Take by mouth daily.     loratadine (CLARITIN) 10 MG tablet Take 10 mg by mouth daily.     losartan-hydrochlorothiazide (HYZAAR) 100-25 MG tablet Take 1 tablet by mouth daily.      montelukast (SINGULAIR) 10 MG tablet TAKE ONE TABLET BY MOUTH DAILY 90 tablet 2   nystatin (MYCOSTATIN) 100000 UNIT/ML suspension Take 5 mLs by mouth daily.      Omeprazole (PRILOSEC PO) Take by mouth 2 (two) times daily. (Patient not taking: Reported on 08/09/2020)     PROAIR HFA 108 (90 Base) MCG/ACT inhaler INHALE 1-2 PUFFS EVERY 6 HOURS AS NEEDED FOR WHEEZING OR SHORTNESS OF BREATH 8.5 g 4   Probiotic Product (PROBIOTIC PO) Take by mouth.     No current facility-administered medications for this visit.    Medication Side Effects: None  Allergies:  Allergies  Allergen Reactions   Simponi [Golimumab]     Repeated infections   Aspirin     REACTION: throat swelling   Other Other (See Comments)    REACTION: oral sores   Sulfonamide Derivatives     REACTION: oral sores   Theophyllines  Past Medical History:  Diagnosis Date   ALLERGIC RHINITIS    Asthma    GERD (gastroesophageal reflux disease)    Rheumatoid arthritis(714.0)     Family History  Problem Relation Age of Onset   Multiple sclerosis Father    Breast cancer Mother    Breast cancer Sister    Alcoholism Child        now sober    Social History   Socioeconomic History   Marital status: Married    Spouse name: Not on file   Number of children: Not on file   Years of education: Not on file   Highest education level: Not on file   Occupational History   Not on file  Tobacco Use   Smoking status: Former Smoker    Packs/day: 1.00    Years: 20.00    Pack years: 20.00    Types: Cigarettes    Quit date: 10/08/1998    Years since quitting: 21.8   Smokeless tobacco: Former Systems developer    Types: Snuff, Chew    Quit date: 1988  Vaping Use   Vaping Use: Never used  Substance and Sexual Activity   Alcohol use: Not Currently   Drug use: No   Sexual activity: Not on file  Other Topics Concern   Not on file  Social History Narrative   Not on file   Social Determinants of Health   Financial Resource Strain:    Difficulty of Paying Living Expenses: Not on file  Food Insecurity:    Worried About Charity fundraiser in the Last Year: Not on file   [employer] of Food in the Last Year: Not on file  Transportation Needs:    Lack of Transportation (Medical): Not on file   Lack of Transportation (Non-Medical): Not on file  Physical Activity:    Days of Exercise per Week: Not on file   Minutes of Exercise per Session: Not on file  Stress:    Feeling of Stress : Not on file  Social Connections:    Frequency of Communication with Friends and Family: Not on file   Frequency of Social Gatherings with Friends and Family: Not on file   Attends Religious Services: Not on file   Active Member of Clubs or Organizations: Not on file   Attends Archivist Meetings: Not on file   Marital Status: Not on file  Intimate Partner Violence:    Fear of Current or Ex-Partner: Not on file   Emotionally Abused: Not on file   Physically Abused: Not on file   Sexually Abused: Not on file    Past Medical History, Surgical history, Social history, and Family history were reviewed and updated as appropriate.   Please see review of systems for further details on the patient's review from today.   Objective:   Physical Exam:  There were no vitals taken for this visit.  Physical Exam Constitutional:       General: He is not in acute distress. Musculoskeletal:        General: No deformity.  Neurological:     Mental Status: He is alert and oriented to person, place, and time.     Coordination: Coordination normal.  Psychiatric:        Attention and Perception: Attention and perception normal. He does not perceive auditory or visual hallucinations.        Mood and Affect: Mood normal. Mood is not anxious or depressed. Affect is not labile, blunt, angry  or inappropriate.        Speech: Speech normal.        Behavior: Behavior normal.        Thought Content: Thought content normal. Thought content is not paranoid or delusional. Thought content does not include homicidal or suicidal ideation. Thought content does not include homicidal or suicidal plan.        Cognition and Memory: Cognition and memory normal.        Judgment: Judgment normal.     Comments: Insight intact     Lab Review:     Component Value Date/Time   NA 140 07/25/2020 0933   K 3.9 07/25/2020 0933   CL 103 07/25/2020 0933   CO2 26 07/25/2020 0933   GLUCOSE 105 (H) 07/25/2020 0933   BUN 18 07/25/2020 0933   CREATININE 1.15 07/25/2020 0933   CREATININE 1.10 12/07/2019 0859   CALCIUM 9.7 07/25/2020 0933   PROT 7.2 07/25/2020 0933   ALBUMIN 4.1 07/25/2020 0933   AST 24 07/25/2020 0933   ALT 24 07/25/2020 0933   ALKPHOS 54 07/25/2020 0933   BILITOT 0.4 07/25/2020 0933   GFRNONAA >60 07/25/2020 0933   GFRNONAA 69 12/07/2019 0859   GFRAA >60 05/23/2020 0908   GFRAA 80 12/07/2019 0859       Component Value Date/Time   WBC 5.3 07/25/2020 0933   RBC 4.50 07/25/2020 0933   HGB 14.1 07/25/2020 0933   HCT 42.6 07/25/2020 0933   PLT 268 07/25/2020 0933   MCV 94.7 07/25/2020 0933   MCH 31.3 07/25/2020 0933   MCHC 33.1 07/25/2020 0933   RDW 13.2 07/25/2020 0933   LYMPHSABS 1.7 07/25/2020 0933   MONOABS 0.6 07/25/2020 0933   EOSABS 0.1 07/25/2020 0933   BASOSABS 0.1 07/25/2020 0933    Lithium Lvl  Date Value Ref  Range Status  05/23/2017 0.7 0.6 - 1.2 mmol/L Final    Comment:    ** Please note change in unit of measure and reference range(s). **     No results found for: PHENYTOIN, PHENOBARB, VALPROATE, CBMZ   .res Assessment: Plan:    Plan:  Abilify 10mg  tablet daily  Clonazepam 1mg  at hs Cogentin 0.5mg  at hs - 1/2 tablet   RTC 3 months  Patient advised to contact office with any questions, adverse effects, or acute worsening in signs and symptoms.  Discussed potential benefits, risk, and side effects of benzodiazepines to include potential risk of tolerance and dependence, as well as possible drowsiness. Advised patient not to drive if experiencing drowsiness and to take lowest possible effective dose to minimize risk of dependence and tolerance.  Discussed potential metabolic side effects associated with atypical antipsychotics, as well as potential risk for movement side effects. Advised pt to contact office if movement side effects occur.     Diagnoses and all orders for this visit:  Generalized anxiety disorder -     clonazePAM (KLONOPIN) 1 MG tablet; Take 1 tablet (1 mg total) by mouth at bedtime. -     ARIPiprazole (ABILIFY) 10 MG tablet; Take one tablet at bedtime.  Insomnia, unspecified type -     clonazePAM (KLONOPIN) 1 MG tablet; Take 1 tablet (1 mg total) by mouth at bedtime.  Episodic mood disorder (HCC) -     benztropine (COGENTIN) 0.5 MG tablet; Take 1 tablet (0.5 mg total) by mouth daily. -     ARIPiprazole (ABILIFY) 10 MG tablet; Take one tablet at bedtime.  Major depressive disorder, recurrent episode, moderate (HCC) -  ARIPiprazole (ABILIFY) 10 MG tablet; Take one tablet at bedtime.     Please see After Visit Summary for patient specific instructions.  Future Appointments  Date Time Provider [place]  08/18/2020  9:45 AM Tat, Eustace Quail, DO [facility] None  11/03/2020  9:30 AM Baird Lyons D, MD [facility] None    No orders of the defined  types were placed in this encounter.   -------------------------------

## 2020-08-12 LAB — QUANTIFERON-TB GOLD PLUS
Mitogen-NIL: >10 IU/mL
NIL: 0.03 IU/mL
QuantiFERON-TB Gold Plus: NEGATIVE
TB1-NIL: 0 IU/mL
TB2-NIL: 0 IU/mL

## 2020-08-12 NOTE — Progress Notes (Signed)
TB gold is negative.

## 2020-08-17 ENCOUNTER — Other Ambulatory Visit: Payer: Self-pay

## 2020-08-17 NOTE — Progress Notes (Deleted)
Assessment/Plan:   ***   Subjective:   [name] [name] was seen today for memory change.  My previous records as well as any outside records available were reviewed prior to todays visit.  I saw the patient about a year and a half ago, but that was for lithium-induced tremor.  Today's referral was for memory change and gait/balance issues that have been going on for about 6 months.  Prior referring notes indicate that patient had normal MMSE.  Saw psychiatric nurse practitioner on November fourth and also complained about memory change.  Living situation:  Pt {LIVES WITH:5711::"lives with their family"}.  The patient does *** do the finances in the home.  The patient *** drive.   There have *** been any motor vehicle accidents in the recent years.  However, he has to use GPS to get to new places.  The patient does *** cook.  There is *** difficulty remembering common recipes.  The stovetop has *** been left on accidentally.  ADL's:  The patient is *** able to perform ***own ADL's. The {med prov:18004}.The patients bladder and bowel are *** under good control.   Behavior:   Family and patient report problems with {alzheimer's behavior:18002}. There have been *** behavioral changes over the years.    Meds that may affect memory: Patient is no longer on lithium, but is now on aripiprazole; clonazepam, 1 mg daily (PDMP indicates that it is filled every 30 days); Cogentin  Patient had head CT on October 19th, 2021.  I personally reviewed this.  It was unremarkable.  PREVIOUS MEDICATIONS: {Parkinson's RX:18200}  CURRENT MEDICATIONS:  Outpatient Encounter Medications as of 08/18/2020  Medication Sig  . Abatacept (ORENCIA IV) Inject into the vein every 30 (thirty) days.  Marland Kitchen acetaminophen (TYLENOL) 325 MG tablet Take 650 mg by mouth every 6 (six) hours as needed.  . ARIPiprazole (ABILIFY) 10 MG tablet Take one tablet at bedtime.  . benztropine (COGENTIN) 0.5 MG tablet Take 1 tablet (0.5 mg  total) by mouth daily.  . cholestyramine (QUESTRAN) 4 GM/DOSE powder Take by mouth 3 (three) times daily as needed.  . clonazePAM (KLONOPIN) 1 MG tablet Take 1 tablet (1 mg total) by mouth at bedtime.  . fluticasone (FLONASE) 50 MCG/ACT nasal spray Place 1 spray into both nostrils daily.  . Fluticasone-Umeclidin-Vilant (TRELEGY ELLIPTA) 100-62.5-25 MCG/INH AEPB INHALE ONE PUFF BY MOUTH DAILY  . guaiFENesin (MUCUS RELIEF ADULT PO) Take by mouth daily.  Marland Kitchen loratadine (CLARITIN) 10 MG tablet Take 10 mg by mouth daily.  Marland Kitchen losartan-hydrochlorothiazide (HYZAAR) 100-25 MG tablet Take 1 tablet by mouth daily.   . montelukast (SINGULAIR) 10 MG tablet TAKE ONE TABLET BY MOUTH DAILY  . nystatin (MYCOSTATIN) 100000 UNIT/ML suspension Take 5 mLs by mouth daily.   . Omeprazole (PRILOSEC PO) Take by mouth 2 (two) times daily. (Patient not taking: Reported on 08/09/2020)  . PROAIR HFA 108 (90 Base) MCG/ACT inhaler INHALE 1-2 PUFFS EVERY 6 HOURS AS NEEDED FOR WHEEZING OR SHORTNESS OF BREATH  . Probiotic Product (PROBIOTIC PO) Take by mouth.   No facility-administered encounter medications on file as of 08/18/2020.     Objective:   PHYSICAL EXAMINATION:    VITALS:  There were no vitals filed for this visit.  GEN:  The patient appears stated age and is in NAD. HEENT:  Normocephalic, atraumatic.  The mucous membranes are moist. The superficial temporal arteries are without ropiness or tenderness. CV:  RRR Lungs:  CTAB Neck/HEME:  There are no carotid  bruits bilaterally.  Neurological examination:  Orientation: The patient is alert and oriented x3. Montreal Cognitive Assessment  08/17/2020  Visuospatial/ Executive (0/5) 3  Naming (0/3) 3  Attention: Read list of digits (0/2) 1  Attention: Read list of letters (0/1) 1  Attention: Serial 7 subtraction starting at 100 (0/3) 2  Language: Repeat phrase (0/2) 2  Language : Fluency (0/1) 1  Abstraction (0/2) 2  Delayed Recall (0/5) 2  Orientation (0/6)  6  Total 23    Cranial nerves: There is good facial symmetry.The speech is fluent and clear. Soft palate rises symmetrically and there is no tongue deviation. Hearing is intact to conversational tone. Sensation: Sensation is intact to light touch throughout Motor: Strength is at least antigravity x4.  Movement examination: Tone: There is normal tone in the UE/LE Abnormal movements:  no tremor.  No myoclonus.  No asterixis.   Coordination:  There is no decremation with RAM's. Gait and Station: The patient has no difficulty arising out of a deep-seated chair without the use of the hands. The patient's stride length is good.    I have reviewed and interpreted the following labs independently Patient had lab work with primary care on July 13, 2020.  B12 was 531.  Folate greater than 20.   Chemistry      Component Value Date/Time   NA 140 07/25/2020 0933   K 3.9 07/25/2020 0933   CL 103 07/25/2020 0933   CO2 26 07/25/2020 0933   BUN 18 07/25/2020 0933   CREATININE 1.15 07/25/2020 0933   CREATININE 1.10 12/07/2019 0859      Component Value Date/Time   CALCIUM 9.7 07/25/2020 0933   ALKPHOS 54 07/25/2020 0933   AST 24 07/25/2020 0933   ALT 24 07/25/2020 0933   BILITOT 0.4 07/25/2020 0933       ***Total time spent on today's visit was *** minutes, including both face-to-face time and nonface-to-face time.  Time included that spent on review of records (prior notes available to me/labs/imaging if pertinent), discussing treatment and goals, answering patient's questions and coordinating care.  Cc:  Wenda Low, MD

## 2020-08-18 ENCOUNTER — Ambulatory Visit: Payer: Medicare Other | Admitting: Neurology

## 2020-08-18 ENCOUNTER — Other Ambulatory Visit: Payer: Self-pay

## 2020-08-18 NOTE — Progress Notes (Signed)
Assessment/Plan:   1.  Memory change  -Unclear if neurodegenerative.  He actually did not remember coming to my office and meeting me a year and a half ago for consultation.    -We will go ahead and schedule neurocognitive testing.  I did ask him to bring a friend/family member/neighbor to the visit.  He thinks he is going to bring his neighbor.  He does not live with anybody.  No family members locally.  -Last lab work has been completed, but I did not get a TSH and we will go ahead and do that.  -Safety discussed.  2.  Tremor  -off of lithium now and patient thinks better  -still with some tremor so likely had baseline essential tremor, exacerbated by lithium.  Patient really thinks he is doing better and no treatment necessary right now.   -Patient is on Abilify, but I really did not see any parkinsonism or tremor associated with aripiprazole.   Subjective:   [name] [name] was seen today for memory change.  My previous records as well as any outside records available were reviewed prior to todays visit.  I saw the patient about a year and a half ago, but that was for lithium-induced tremor.  Today's referral was for memory change and gait/balance issues that have been going on for about 6 months per notes but pt states that it has been going on for 1 year to 1.5 years.  Prior referring notes indicate that patient had normal MMSE.  Saw psychiatric nurse practitioner on November fourth and also complained about memory change.  Living situation:  Pt lives alone.  The patient does do the finances in the home.  He doesn't have trouble with this.   The patient does drive.   There have not been any motor vehicle accidents in the recent years.  However, he has to use GPS to get to new places and has noted getting lost without GPS, even in familiar places (going to [place]).  The patient does  [name] [name] some.  There is no difficulty remembering common recipes.  The stovetop has not been left on  accidentally.  He does "double check."    ADL's:  The patient is able to perform his own ADL's. The patient self medicates.  He does have a pill box he uses if he is not home.  no problems with remembering to take medications.  The patients bladder and bowel are under good control.   Behavior:  Pt reports that mood has been good.   He is noting word finding trouble.   Meds that may affect memory: Patient is no longer on lithium, but is now on aripiprazole (been on for 9 months per pt); clonazepam, 1 mg daily (PDMP indicates that it is filled every 30 days); Cogentin  Patient had head CT on October 19th, 2021.  I personally reviewed this.  It was unremarkable.    CURRENT MEDICATIONS:  Outpatient Encounter Medications as of 08/25/2020  Medication Sig  . Abatacept (ORENCIA IV) Inject into the vein every 30 (thirty) days.  Marland Kitchen acetaminophen (TYLENOL) 325 MG tablet Take 650 mg by mouth every 6 (six) hours as needed.  . ARIPiprazole (ABILIFY) 10 MG tablet Take one tablet at bedtime.  . benztropine (COGENTIN) 0.5 MG tablet Take 1 tablet (0.5 mg total) by mouth daily.  . cholestyramine (QUESTRAN) 4 GM/DOSE powder Take by mouth 3 (three) times daily as needed.  . clonazePAM (KLONOPIN) 1 MG tablet Take 1 tablet (1  mg total) by mouth at bedtime.  . fluticasone (FLONASE) 50 MCG/ACT nasal spray Place 1 spray into both nostrils daily.  . Fluticasone-Umeclidin-Vilant (TRELEGY ELLIPTA) 100-62.5-25 MCG/INH AEPB INHALE ONE PUFF BY MOUTH DAILY  . guaiFENesin (MUCUS RELIEF ADULT PO) Take by mouth daily.  Marland Kitchen loratadine (CLARITIN) 10 MG tablet Take 10 mg by mouth daily.  Marland Kitchen losartan-hydrochlorothiazide (HYZAAR) 100-25 MG tablet Take 1 tablet by mouth daily.   . montelukast (SINGULAIR) 10 MG tablet TAKE ONE TABLET BY MOUTH DAILY  . nystatin (MYCOSTATIN) 100000 UNIT/ML suspension Take 5 mLs by mouth daily.   . Omeprazole (PRILOSEC PO) Take by mouth 2 (two) times daily.   Marland Kitchen PROAIR HFA 108 (90 Base) MCG/ACT inhaler  INHALE 1-2 PUFFS EVERY 6 HOURS AS NEEDED FOR WHEEZING OR SHORTNESS OF BREATH  . Probiotic Product (PROBIOTIC PO) Take 1 tablet by mouth daily.    No facility-administered encounter medications on file as of 08/25/2020.     Objective:   PHYSICAL EXAMINATION:    VITALS:   Vitals:   08/25/20 1029  BP: 108/72  Pulse: 73  SpO2: 94%  Weight: 186 lb (84.4 kg)  Height: 5\' 8"  (1.727 m)    GEN:  The patient appears stated age and is in NAD. HEENT:  Normocephalic, atraumatic.  The mucous membranes are moist. The superficial temporal arteries are without ropiness or tenderness. CV:  RRR Lungs:  CTAB Neck/HEME:  There are no carotid bruits bilaterally.  Neurological examination:  Orientation: The patient is alert and oriented x3. Montreal Cognitive Assessment  08/17/2020  Visuospatial/ Executive (0/5) 3  Naming (0/3) 3  Attention: Read list of digits (0/2) 1  Attention: Read list of letters (0/1) 1  Attention: Serial 7 subtraction starting at 100 (0/3) 2  Language: Repeat phrase (0/2) 2  Language : Fluency (0/1) 1  Abstraction (0/2) 2  Delayed Recall (0/5) 2  Orientation (0/6) 6  Total 23    Cranial nerves: There is good facial symmetry.extraocular muscles are intact.  Visual fields are full.  The speech is fluent and clear. Soft palate rises symmetrically and there is no tongue deviation. Hearing is intact to conversational tone. Sensation: Sensation is intact to light touch throughout.  Vibration is intact in the bilateral ankle. Motor: Strength is 5/5 in the bilateral upper and lower extremities.  Movement examination: Tone: There is normal tone in the UE/LE.   Abnormal movements:  no rest tremor even with distraction procedures.  There is postural tremor and intention tremor bilaterally, fairly mild.  No myoclonus.  No asterixis.   Coordination:  There is no decremation with RAM's. Gait and Station: The patient has no difficulty arising out of a deep-seated chair without  the use of the hands. The patient's stride length is good.    I have reviewed and interpreted the following labs independently Patient had lab work with primary care on July 13, 2020.  B12 was 531.  Folate greater than 20.   Chemistry      Component Value Date/Time   NA 140 07/25/2020 0933   K 3.9 07/25/2020 0933   CL 103 07/25/2020 0933   CO2 26 07/25/2020 0933   BUN 18 07/25/2020 0933   CREATININE 1.15 07/25/2020 0933   CREATININE 1.10 12/07/2019 0859      Component Value Date/Time   CALCIUM 9.7 07/25/2020 0933   ALKPHOS 54 07/25/2020 0933   AST 24 07/25/2020 0933   ALT 24 07/25/2020 0933   BILITOT 0.4 07/25/2020 0933  No results found for: TSH   Total time spent on today's visit was 40 minutes, including both face-to-face time and nonface-to-face time.  Time included that spent on review of records (prior notes available to me/labs/imaging if pertinent), discussing treatment and goals, answering patient's questions and coordinating care.  Cc:  Wenda Low, MD

## 2020-08-19 ENCOUNTER — Other Ambulatory Visit: Payer: Self-pay | Admitting: Physician Assistant

## 2020-08-19 ENCOUNTER — Telehealth: Payer: Self-pay

## 2020-08-19 DIAGNOSIS — M0609 Rheumatoid arthritis without rheumatoid factor, multiple sites: Secondary | ICD-10-CM

## 2020-08-19 NOTE — Telephone Encounter (Signed)
Patient called stating new orders are needed before he can schedule his Orencia infusion for December.  Patient is requesting a return call.

## 2020-08-19 NOTE — Telephone Encounter (Signed)
Left message to advise patient infusion orders have been placed.

## 2020-08-19 NOTE — Telephone Encounter (Signed)
Orders placed today and reviewed with Dr. Estanislado Pandy prior to signing.

## 2020-08-19 NOTE — Progress Notes (Signed)
Next infusion scheduled for Orencia and due for updated orders.  Last Visit: 08/09/20  Last infusion: 07/25/20 Next Visit: No appointment scheduled  Labs: CBC and CMP were updated on 07/25/20  TB Gold: negative on 08/09/20  No recent infections or hospitalizations   Orders placed for Orencia x 3 doses along with premedication of Tylenol and Benadryl.  Standing CBC/CMP orders placed.  Orders were reviewed with Dr. Estanislado Pandy prior to signing.    Hazel Sams, PA-C

## 2020-08-25 ENCOUNTER — Ambulatory Visit (INDEPENDENT_AMBULATORY_CARE_PROVIDER_SITE_OTHER): Payer: Medicare Other | Admitting: Neurology

## 2020-08-25 ENCOUNTER — Encounter: Payer: Self-pay | Admitting: Neurology

## 2020-08-25 ENCOUNTER — Other Ambulatory Visit: Payer: Self-pay

## 2020-08-25 ENCOUNTER — Other Ambulatory Visit (INDEPENDENT_AMBULATORY_CARE_PROVIDER_SITE_OTHER): Payer: Medicare Other

## 2020-08-25 VITALS — BP 108/72 | HR 73 | Ht 68.0 in | Wt 186.0 lb

## 2020-08-25 DIAGNOSIS — R251 Tremor, unspecified: Secondary | ICD-10-CM

## 2020-08-25 DIAGNOSIS — R413 Other amnesia: Secondary | ICD-10-CM

## 2020-08-25 LAB — TSH: TSH: 0.61 u[IU]/mL (ref 0.35–4.50)

## 2020-08-25 NOTE — Patient Instructions (Addendum)
You have been referred for a neurocognitive evaluation (i.e., evaluation of memory and thinking abilities). Please bring someone with you to this appointment if possible, as it is helpful for the neuropsychologist to hear from both you and another adult who knows you well. Please bring eyeglasses and hearing aids if you wear them.    The evaluation will take approximately 3 hours and has two parts:   . The first part is a clinical interview with the neuropsychologist, Dr. Melvyn Novas or Dr. Nicole Kindred. During the interview, the neuropsychologist will speak with you and the individual you brought to the appointment.    . The second part of the evaluation is testing with the doctor's technician, aka psychometrician, Hinton Dyer or [employer]. During the testing, the technician will ask you to remember different types of material, solve problems, and answer some questionnaires. Your family member will not be present for this portion of the evaluation.   Please note: We have to reserve several hours of the neuropsychologist's time and the psychometrician's time for your evaluation appointment. As such, there is a No-Show fee of $100. If you are unable to attend any of your appointments, please contact our office as soon as possible to reschedule.  Your provider has requested that you have labwork completed today. Please go to [facility] ([address]) on the second floor of this building before leaving the office today. You do not need to check in. If you are not called within 15 minutes please check with the front desk.

## 2020-09-14 ENCOUNTER — Ambulatory Visit: Payer: Medicare Other

## 2020-09-14 ENCOUNTER — Other Ambulatory Visit: Payer: Self-pay

## 2020-09-14 ENCOUNTER — Encounter: Payer: Self-pay | Admitting: Psychology

## 2020-09-14 ENCOUNTER — Ambulatory Visit (INDEPENDENT_AMBULATORY_CARE_PROVIDER_SITE_OTHER): Payer: Medicare Other | Admitting: Psychology

## 2020-09-14 DIAGNOSIS — F411 Generalized anxiety disorder: Secondary | ICD-10-CM | POA: Diagnosis not present

## 2020-09-14 DIAGNOSIS — R4189 Other symptoms and signs involving cognitive functions and awareness: Secondary | ICD-10-CM

## 2020-09-14 DIAGNOSIS — G3184 Mild cognitive impairment, so stated: Secondary | ICD-10-CM

## 2020-09-14 HISTORY — DX: Mild cognitive impairment of uncertain or unknown etiology: G31.84

## 2020-09-14 NOTE — Progress Notes (Addendum)
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 a shape learning task. Performance across recognition tasks was variable with a noted strength in visual relative to verbal tasks, suggesting limited evidence for information consolidation.   Results of emotional screening instruments suggested that recent symptoms of generalized anxiety were in the moderate range, while symptoms of depression were within normal limits. A screening instrument assessing recent sleep quality suggested the presence of minimal sleep dysfunction.  Tables of Scores:   Note: This summary of test scores accompanies the interpretive report and should not be considered in isolation without reference to the appropriate sections in the text. Descriptors are based on appropriate normative data and may be adjusted based on clinical judgment. The terms "impaired" and "within normal limits (WNL)" are used when a more specific level of functioning cannot be determined.       Effort Testing:   DESCRIPTOR       ACS Word Choice: --- --- Within Expectation  Dot Counting Test: --- --- Within Expectation  NAB EVI: --- --- Below Expectation  D-KEFS Color Word Effort Index: --- --- Within Expectation       Orientation:       Raw Score Percentile   NAB Orientation, Form 1 29/29 --- ---       Cognitive Screening:           Raw Score Percentile   SLUMS: 13/30 --- ---       Intellectual Functioning:           Standard Score Percentile   Test of Premorbid Functioning: 108 70 Average       Memory:          NAB Memory Module, Form 2: Standard Score/ T Score Percentile   Total Memory Index 60 <1 Exceptionally Low  List Learning       Total Trials 1-3 12/36 (24) <1 Exceptionally Low    List B 4/12 (47) 38 Average    Short Delay Free Recall 0/12 (19) <1 Exceptionally Low    Long Delay Free Recall 0/12 (23) <1 Exceptionally Low    Retention Percentage 0 (19) <1 Exceptionally Low    Recognition Discriminability 1 (26) 1 Exceptionally Low  Shape Learning       Total Trials 1-3 15/27 (47) 38 Average    Delayed Recall 5/9 (45) 31 Average    Retention Percentage 83 (45) 31 Average    Recognition Discriminability 6 (46) 34 Average  Story Learning       Immediate Recall 30/80 (34) 5 Well Below Average    Delayed Recall 0/40 (22) <1 Exceptionally Low    Retention Percentage 0 (13) <1 Exceptionally Low  Daily  Living Memory       Immediate Recall 22/51 (19) <1 Exceptionally Low    Delayed Recall 6/17 (19) <1 Exceptionally Low    Retention Percentage 67 (34) 5 Well Below Average    Recognition Hits 9/10 (52) 58 Average       Attention/Executive Function:          Trail Making Test (TMT): Raw Score (T Score) Percentile     Part A 49 secs.,  0 errors (37) 9 Below Average    Part B DC'D @ 300,  4 errors --- Impaired        Symbol Digit Modalities Test (SDMT): Raw Score (Z-Score) Percentile     Oral 31 (-1.61) 5 Well Below Average       NAB Attention Module, Form 1: T Score Percentile     Digits Forward 53 62 Average    Digits Backwards 25 1 Exceptionally Low   Scaled Score Percentile   WAIS-IV Similarities: 8 25 Average       D-KEFS Color-Word Interference Test: Raw Score (Scaled Score) Percentile      Color Naming 41 secs. (6) 9 Below Average    Word Reading 24 secs. (10) 50 Average    Inhibition 135 secs. (1) <1 Exceptionally Low      Total Errors 4 errors (8) 25 Average    Inhibition/Switching 124 secs. (3) 1 Exceptionally Low      Total Errors 7 errors (6) 9 Below Average       NAB Executive Functions Module, Form 1: T Score Percentile     Judgment 35 7 Well Below Average       Language:          Verbal Fluency Test: Raw Score (T Score) Percentile     Phonemic Fluency (FAS) 28 (37) 9 Below Average    Animal Fluency 11 (29) 2 Exceptionally Low        NAB Language Module, Form 2: T Score Percentile     Auditory Comprehension 55 69 Average    Naming 29/31 (43) 25 Average       Visuospatial/Visuoconstruction:      Raw Score Percentile   Clock Drawing: 9/10 --- Within Normal Limits       NAB Spatial Module, Form 2: T Score Percentile     Figure Drawing Copy 46 34 Average        Scaled Score Percentile   WAIS-IV Block Design: 6 9 Below Average       Mood and Personality:      Raw Score Percentile   Geriatric Depression Scale: 9 --- Within Normal Limits  Geriatric Anxiety Scale: 24 --- Moderate    Somatic 8 --- Mild    Cognitive 7 --- Moderate    Affective 9 --- Severe       Additional Questionnaires:      Raw Score Percentile   PROMIS Sleep Disturbance Questionnaire: 15 --- None to Slight   Informed Consent and Coding/Compliance:   Mr. Reesor was provided with a verbal description of the nature and purpose of the present neuropsychological evaluation. Also reviewed were the foreseeable risks and/or discomforts and benefits of the procedure, limits of confidentiality, and mandatory reporting requirements of this provider. The patient was given the opportunity to ask questions and receive answers about the evaluation. Oral consent to participate was provided by the patient.   This evaluation was conducted by Christia Reading, Ph.D., licensed clinical neuropsychologist.  Mr. Prindle completed a 40 minute comprehensive  clinical interview with Dr. Melvyn Novas, billed as one unit [phone], and 150 minutes of cognitive testing and scoring, billed as one unit [phone] and four additional units 96139. Psychometrist Milana Kidney, B.S., assisted Dr. Melvyn Novas with test administration and scoring procedures. As a separate and discrete service, Dr. Melvyn Novas spent a total of 160 minutes in interpretation and report writing billed as one unit [phone] and two units 96133.

## 2020-09-14 NOTE — Progress Notes (Signed)
   Psychometrician Note   Cognitive testing was administered to [name] [name] by Cruzita Lederer, B.S. (psychometrist) under the supervision of Dr. Christia Reading, Ph.D., licensed psychologist on 09/14/2020. [name] [name] did not appear overtly distressed by the testing session per behavioral observation or responses across self-report questionnaires. Dr. Christia Reading, Ph.D. checked in with [name] [name] as needed to manage any distress related to testing procedures (if applicable). Rest breaks were offered.    The battery of tests administered was selected by Dr. Christia Reading, Ph.D. with consideration to [name] [name]'s current level of functioning, the nature of his symptoms, emotional and behavioral responses during interview, level of literacy, observed level of motivation/effort, and the nature of the referral question. This battery was communicated to the psychometrist. Communication between Dr. Christia Reading, Ph.D. and the psychometrist was ongoing throughout the evaluation and Dr. Christia Reading, Ph.D. was immediately accessible at all times. Dr. Christia Reading, Ph.D. provided supervision to the psychometrist on the date of this service to the extent necessary to assure the quality of all services provided.    [name] [name] will return within approximately 1-2 weeks for an interactive feedback session with Dr. [name] at which time his test performances, clinical impressions, and treatment recommendations will be reviewed in detail. [name] [name] understands he can contact our office should he require our assistance before this time.  A total of 150 minutes of billable time were spent face-to-face with [name] [name] by the psychometrist. This includes both test administration and scoring time. Billing for these services is reflected in the clinical report generated by Dr. Christia Reading, Ph.D..  This note reflects time spent with the psychometrician and does not include test scores or any  clinical interpretations made by Dr. [name]. The full report will follow in a separate note.

## 2020-09-19 ENCOUNTER — Ambulatory Visit (HOSPITAL_COMMUNITY)
Admission: RE | Admit: 2020-09-19 | Discharge: 2020-09-19 | Disposition: A | Discharge status: Home / Self Care | Payer: Medicare Other | Source: Ambulatory Visit | Attending: Rheumatology | Admitting: Rheumatology

## 2020-09-19 ENCOUNTER — Other Ambulatory Visit: Payer: Self-pay

## 2020-09-19 DIAGNOSIS — M0609 Rheumatoid arthritis without rheumatoid factor, multiple sites: Secondary | ICD-10-CM | POA: Insufficient documentation

## 2020-09-19 LAB — COMPREHENSIVE METABOLIC PANEL
ALT: 30 U/L (ref 0–44)
AST: 22 U/L (ref 15–41)
Albumin: 4.1 g/dL (ref 3.5–5.0)
Alkaline Phosphatase: 53 U/L (ref 38–126)
Anion gap: 12 (ref 5–15)
BUN: 18 mg/dL (ref 8–23)
CO2: 25 mmol/L (ref 22–32)
Calcium: 9.7 mg/dL (ref 8.9–10.3)
Chloride: 101 mmol/L (ref 98–111)
Creatinine, Ser: 1.02 mg/dL (ref 0.61–1.24)
GFR, Estimated: >60 mL/min (ref >60)
Glucose, Bld: 95 mg/dL (ref 70–99)
Potassium: 3.9 mmol/L (ref 3.5–5.1)
Sodium: 138 mmol/L (ref 135–145)
Total Bilirubin: 0.8 mg/dL (ref 0.3–1.2)
Total Protein: 7.3 g/dL (ref 6.5–8.1)

## 2020-09-19 LAB — CBC WITH DIFFERENTIAL/PLATELET
Abs Immature Granulocytes: 0.03 10*3/uL (ref 0.00–0.07)
Basophils Absolute: 0 10*3/uL (ref 0.0–0.1)
Basophils Relative: 1 %
Eosinophils Absolute: 0.1 10*3/uL (ref 0.0–0.5)
Eosinophils Relative: 1 %
HCT: 43 % (ref 39.0–52.0)
Hemoglobin: 14.5 g/dL (ref 13.0–17.0)
Immature Granulocytes: 0 %
Lymphocytes Relative: 26 %
Lymphs Abs: 2 10*3/uL (ref 0.7–4.0)
MCH: 31.8 pg (ref 26.0–34.0)
MCHC: 33.7 g/dL (ref 30.0–36.0)
MCV: 94.3 fL (ref 80.0–100.0)
Monocytes Absolute: 0.9 10*3/uL (ref 0.1–1.0)
Monocytes Relative: 11 %
Neutro Abs: 4.8 10*3/uL (ref 1.7–7.7)
Neutrophils Relative %: 61 %
Platelets: 264 10*3/uL (ref 150–400)
RBC: 4.56 MIL/uL (ref 4.22–5.81)
RDW: 13.1 % (ref 11.5–15.5)
WBC: 7.9 10*3/uL (ref 4.0–10.5)
nRBC: 0 % (ref 0.0–0.2)

## 2020-09-19 MED ORDER — DIPHENHYDRAMINE HCL 25 MG PO CAPS: 25.0000 mg | ORAL_CAPSULE | ORAL | Status: DC

## 2020-09-19 MED ORDER — ACETAMINOPHEN 325 MG PO TABS: 650.0000 mg | ORAL_TABLET | ORAL | Status: DC

## 2020-09-19 MED ORDER — DIPHENHYDRAMINE HCL 25 MG PO CAPS
ORAL_CAPSULE | ORAL | Status: AC
  Filled 2020-09-19: qty 1

## 2020-09-19 MED ORDER — SODIUM CHLORIDE 0.9 % IV SOLN
750.0000 mg | INTRAVENOUS | Status: DC
  Administered 2020-09-19: 12:00:00 750 mg via INTRAVENOUS
  Filled 2020-09-19: qty 30

## 2020-09-19 MED ORDER — ACETAMINOPHEN 325 MG PO TABS
ORAL_TABLET | ORAL | Status: AC
  Filled 2020-09-19: qty 2

## 2020-09-19 NOTE — Progress Notes (Signed)
CBC WNL

## 2020-09-19 NOTE — Progress Notes (Signed)
CMP WNL

## 2020-09-21 ENCOUNTER — Other Ambulatory Visit: Payer: Self-pay

## 2020-09-21 ENCOUNTER — Ambulatory Visit (INDEPENDENT_AMBULATORY_CARE_PROVIDER_SITE_OTHER): Payer: Medicare Other | Admitting: Psychology

## 2020-09-21 DIAGNOSIS — G3184 Mild cognitive impairment, so stated: Secondary | ICD-10-CM

## 2020-09-21 NOTE — Patient Instructions (Signed)
A repeat neuropsychological evaluation in 12-18 months (or sooner if functional decline is noted) is recommended to assess the trajectory of future cognitive decline should it occur. This will also aid in future efforts towards improved diagnostic clarity.  Per records available to me, his last brain MRI was in 2013. I would recommend that updated neuroimaging be completed to assess for any anatomical correlates to memory dysfunction. This will also allow his medical team to better track any changes over time.  [name] [name] could discuss medication options with his neurologist/PCP surrounding memory dysfunction and may benefit from starting an acetylcholinesterase inhibitor (e.g., Aricept/donepezil). It is important to highlight that in the face of a neurodegenerative condition, medications have been shown only to potentially slow functional decline in some individuals. Unfortunately, no current treatment can stop or reverse illness trajectory.    Should there be a progression of his current deficits over time, [name] [name] is unlikely to regain any independent living skills lost. Therefore, it is recommended that he remain as involved as possible in all aspects of household chores, finances, and medication management, with supervision to ensure adequate performance. He will likely benefit from the establishment and maintenance of a routine in order to maximize his functional abilities over time.  It will be important for [name] [name] to have another person with him when in situations where he may need to process information, weigh the pros and cons of different options, and make decisions, in order to ensure that he fully understands and recalls all information to be considered.  If not already done, [name] [name] and his family may want to discuss his wishes regarding durable power of attorney and medical decision making, so that he can have input into these choices. Additionally, they may wish to  discuss future plans for caretaking and seek out community options for in home/residential care should they become necessary.  [name] [name] is encouraged to attend to lifestyle factors for brain health (e.g., regular physical exercise, good nutrition habits, regular participation in cognitively-stimulating activities, and general stress management techniques), which are likely to have benefits for both emotional adjustment and cognition. In fact, in addition to promoting good general health, regular exercise incorporating aerobic activities (e.g., brisk walking, jogging, cycling, etc.) has been demonstrated to be a very effective treatment for depression and stress, with similar efficacy rates to both antidepressant medication and psychotherapy. Optimal control of vascular risk factors (including safe cardiovascular exercise and adherence to dietary recommendations) is encouraged.   Important information to remember should be provided in written format in all instances. This should be placed in a highly visible and commonly frequented location within his residence to help promote recall.   To address problems with processing speed, he may wish to consider:   -Ensuring that he is alerted when essential material or instructions are being presented   -Adjusting the speed at which new information is presented   -Allowing for more time in comprehending, processing, and responding in conversation  To address problems with working memory and executive dysfunction, he may wish to consider:   -Avoiding external distractions when needing to concentrate   -Limiting exposure to fast paced environments with multiple sensory demands   -Writing down complicated information and using checklists   -Attempting and completing one task at a time (i.e., no multi-tasking)   -Verbalizing aloud each step of a task to maintain focus   -Reducing the amount of information considered at one time

## 2020-09-21 NOTE — Progress Notes (Signed)
   Neuropsychology Feedback Session [facility]  Reason for Referral:   EWING FANDINO a 69 y.o. right-handed Caucasian male referred by Alonza Bogus, D.O.,to characterize hiscurrent cognitive functioning and assist with diagnostic clarity and treatment planning in the context of subjective cognitive decline and history of lithium induced tremors.  Feedback:   Mr. Downs completed a comprehensive neuropsychological evaluation on 09/14/2020. Please refer to that encounter for the full report and recommendations. Briefly, results suggested a primary impairment surrounding all aspects of verbal memory. Additional weaknesses were exhibited across working memory, executive functioning, and semantic fluency. Performance variability was exhibited across processing speed. Specific to memory, Mr. Troung exhibited difficulties learning novel verbal information and was largely amnestic across delayed memory tasks. He further exhibited variable performance across yes/no recognition trials, suggesting concerns for a verbal memory storage deficit. This, coupled with additional weaknesses across executive functioning and semantic fluency raises concerns for the presence of a neurodegenerative condition, namely Alzheimer's disease. However, his cognitive profile is not wholly consistent with the typical presentation of this illness. Namely, he exhibited strengths across confrontation naming and visuospatial tasks, including visual memory. If Alzheimer's disease is indeed present, this could suggest that he is in the earlier stages of this illness presently.  Mr. Geurin was accompanied by his neighbor in person and his daughter via speakerphone during the current feedback session. Content of the current session focused on the results of his neuropsychological evaluation. Mr. Kadlec and his friends/family were given the opportunity to ask questions and their  questions were answered. They were encouraged to reach out should additional questions arise. A copy of his report was provided at the conclusion of the visit. A copy was also mailed to his daughter.      30 minutes were spent conducting the current feedback session with Mr. Gilmer, billed as one unit [phone].

## 2020-09-28 ENCOUNTER — Telehealth: Payer: Self-pay | Admitting: Psychology

## 2020-09-28 NOTE — Telephone Encounter (Signed)
As his report is consider protected health information, I cannot send it via email. I am more than happy to place another copy of his report in the mail. I will need her to provide her mailing address once again. I also had Epic email her instructions for setting up Mr. Olazabal' MyChart account. The report is accessible to her via MyChart if that account was set up.

## 2020-09-28 NOTE — Telephone Encounter (Signed)
Daughter is calling in that she never received the feedback results from her father's testing. She was wondering if you could please resend this to her E-mail: [email or web address] Thanks!

## 2020-09-28 NOTE — Telephone Encounter (Signed)
I had to leave the daughter a message but I gave her all the info below. Will await a return call from her with the correct address to send this to otherwise she knows to check his mychart for information. Looks like his Deloris Ping is in the pending status so she must be working on getting that activated. Thanks!

## 2020-10-02 ENCOUNTER — Other Ambulatory Visit: Payer: Self-pay | Admitting: Internal Medicine

## 2020-10-02 DIAGNOSIS — J4531 Mild persistent asthma with (acute) exacerbation: Secondary | ICD-10-CM

## 2020-10-03 DIAGNOSIS — F319 Bipolar disorder, unspecified: Secondary | ICD-10-CM | POA: Diagnosis not present

## 2020-10-03 DIAGNOSIS — I10 Essential (primary) hypertension: Secondary | ICD-10-CM | POA: Diagnosis not present

## 2020-10-03 DIAGNOSIS — F329 Major depressive disorder, single episode, unspecified: Secondary | ICD-10-CM | POA: Diagnosis not present

## 2020-10-03 DIAGNOSIS — E785 Hyperlipidemia, unspecified: Secondary | ICD-10-CM | POA: Diagnosis not present

## 2020-10-03 DIAGNOSIS — M069 Rheumatoid arthritis, unspecified: Secondary | ICD-10-CM | POA: Diagnosis not present

## 2020-10-03 DIAGNOSIS — J45909 Unspecified asthma, uncomplicated: Secondary | ICD-10-CM | POA: Diagnosis not present

## 2020-10-03 DIAGNOSIS — J4520 Mild intermittent asthma, uncomplicated: Secondary | ICD-10-CM | POA: Diagnosis not present

## 2020-10-03 DIAGNOSIS — K219 Gastro-esophageal reflux disease without esophagitis: Secondary | ICD-10-CM | POA: Diagnosis not present

## 2020-10-11 ENCOUNTER — Telehealth: Payer: Self-pay | Admitting: Rheumatology

## 2020-10-11 NOTE — Telephone Encounter (Signed)
Noted - no changes in coverage of Orencia infusions. Thank you.

## 2020-10-11 NOTE — Telephone Encounter (Signed)
Patient called to let us know his insurance has not changed. He still has Medicare, and [employer].

## 2020-11-02 NOTE — Progress Notes (Signed)
HPI M former smoker followed for asthma, allergic rhinitis, hx rhinosinusitis, nasal polyps complicated by rheumatoid arthritis. FENO- 08/07/16- 25 Office spirometry 08/07/16- WNL, FEV1 3.59/ 110%, R 0.81 PFT 11/01/16- WNL-FVC 5.30/123%, FEV1 4.0/124%, ratio 0.75, FEF 25-75% 3.49/135%, TLC 131%, DLCO 99% Quantiferon-TB Gold 08/12/19- NEG  ---------------------------------------------------------------   05/03/20- 70 year old male former smoker followed for Asthma, Allergic Rhinitis, history rhinosinusitis, nasal polyps, complicated by Rheumatoid Arthritis/MTX/ Orencia, Bipolar, GERD Pro-air HFA, Trelegy 100, Singulair,. Flonase, -----Breathing improved with Trelegy. ACT score 18 with occasional asthma waking, used rescue 3x/ 4 weeks. He feels very well controlled compared with remote past and credits Trelegy. Asks refill flonase since Dr Erik Obey retired.  Had 2 Phizer Covax. Discussed experience with Orencia for RA- noted arthralgias when he had to be off it.  CXR 11/03/19- Lungs clear. Heart size normal. No pneumothorax or pleural fluid. No acute or focal bony abnormality. IMPRESSION: Negative chest.  11/03/20- 70 year old male former smoker followed for Asthma, Allergic Rhinitis, history rhinosinusitis, nasal polyps, complicated by Rheumatoid Arthritis// Orencia, Bipolar, GERD, Alzheimers, Pro-air HFA, Trelegy 100, Singulair,. Flonase, Covid vax-3 Phizer Flu vax-had ACT score 25 Newly dx'd Alzheimers> anxious    Dr Tat/ Neurology  Doesn't like dispenser for generic albuterol. Has  Been using it 1-2x/ day, blaming "stress'. Price of Trelegy now 160$- getting too expensive.  He is going to [employer].   ROS-see HPI    + = positive Constitutional:   No-   weight loss, night sweats, fevers, chills, fatigue, lassitude. HEENT:   No-  headaches, difficulty swallowing, tooth/dental problems, sore throat,       No-  sneezing, itching, ear ache, nasal congestion,+ post nasal drip,   CV:  No-   chest pain, orthopnea, PND, swelling in lower extremities, anasarca, dizziness, palpitations Resp: No-   shortness of breath with exertion or at rest.                productive cough,   non-productive cough,   coughing up of blood.                 change in color of mucus.    wheezing.   Skin: No rash GI:  heartburn, indigestion, abdominal pain, nausea, vomiting, GU:  MS: +  joint pain or swelling.   Neuro-     nothing unusual Psych:  No- change in mood or affect. No depression or anxiety.  No memory loss.  OBJ General- Alert, Oriented, Affect-+ anxious, Distress- none acute,  Skin-  Lymphadenopathy- none Head- atraumatic            Eyes- Gross vision intact, PERRLA, conjunctivae clear secretions            Ears- Hearing, canals-normal            Nose-  no-Septal dev, mucus, polyps-, erosion, perforation             Throat- Mallampati III , mucosa clear , drainage- none, tonsils- atrophic, Neck- flexible , trachea midline, no stridor , thyroid nl, carotid no bruit Chest - symmetrical excursion , unlabored           Heart/CV- RRR , no murmur , no gallop  , no rub, nl s1 s2                           - JVD- none , edema- none, stasis changes- none, varices- none           Lung- +  clear,, wheeze- none, cough -none                               dullness-none, rub- none           Chest wall-  Abd-  Br/ Gen/ Rectal- Not done, not indicated Extrem- cyanosis- none, clubbing, none, atrophy- none, strength- nl.                          +Synovial thickening of hand joints. Neuro- + tremor hands, some word-searching

## 2020-11-03 ENCOUNTER — Other Ambulatory Visit: Payer: Self-pay

## 2020-11-03 ENCOUNTER — Encounter: Payer: Self-pay | Admitting: Internal Medicine

## 2020-11-03 ENCOUNTER — Ambulatory Visit (INDEPENDENT_AMBULATORY_CARE_PROVIDER_SITE_OTHER): Payer: Medicare Other | Admitting: Internal Medicine

## 2020-11-03 DIAGNOSIS — J4531 Mild persistent asthma with (acute) exacerbation: Secondary | ICD-10-CM | POA: Diagnosis not present

## 2020-11-03 DIAGNOSIS — G3184 Mild cognitive impairment, so stated: Secondary | ICD-10-CM

## 2020-11-03 DIAGNOSIS — J330 Polyp of nasal cavity: Secondary | ICD-10-CM

## 2020-11-03 MED ORDER — TRELEGY ELLIPTA 100-62.5-25 MCG/INH IN AEPB: INHALATION_SPRAY | RESPIRATORY_TRACT | 5 refills | Status: DC

## 2020-11-03 MED ORDER — ALBUTEROL SULFATE HFA 108 (90 BASE) MCG/ACT IN AERS: INHALATION_SPRAY | RESPIRATORY_TRACT | 12 refills | Status: DC

## 2020-11-03 NOTE — Patient Instructions (Signed)
Refill scripts sent for Trelegy and Proair hfa  Ask your pharmacy to [facility] with your Trelegy.  Ask Dr Tat about maintenance sildenafil (Viagra) for dementia- She can Google big study.  Please cal if we can help

## 2020-11-11 ENCOUNTER — Ambulatory Visit (INDEPENDENT_AMBULATORY_CARE_PROVIDER_SITE_OTHER): Payer: Medicare Other | Admitting: Adult Health

## 2020-11-11 ENCOUNTER — Encounter: Payer: Self-pay | Admitting: Adult Health

## 2020-11-11 ENCOUNTER — Other Ambulatory Visit: Payer: Self-pay

## 2020-11-11 DIAGNOSIS — G4700 Insomnia, unspecified: Secondary | ICD-10-CM

## 2020-11-11 DIAGNOSIS — F39 Unspecified mood [affective] disorder: Secondary | ICD-10-CM | POA: Diagnosis not present

## 2020-11-11 DIAGNOSIS — F331 Major depressive disorder, recurrent, moderate: Secondary | ICD-10-CM

## 2020-11-11 DIAGNOSIS — F411 Generalized anxiety disorder: Secondary | ICD-10-CM

## 2020-11-11 NOTE — Progress Notes (Signed)
[name] [name] [id number] July 08, 1951 70 y.o.  Subjective:   Patient ID:  [name] [name] is a 70 y.o. (DOB 1951/05/12) male.  Chief Complaint: No chief complaint on file.   HPI [name] [name] presents to the office today for follow-up of Anxiety, Depression, Mood disorder, and Insomnia.  Describes mood today as "ok". Pleasant. Mood symptoms - reports depression and anxiety. Denies irritability. Stating "I've been a little down". Diagnosed with Alzheimers 2 weeks ago. Plans to follow up with Dr. Carles Collet. Feels like medications continue to work well. Stable interest and motivation. Taking medications as prescribed.  Energy levels stable. Active, has a regular exercise routine. Enjoys some usual interests and activities. Lives alone. Spending time with dog "Minnie". Talking with children. Appetite adequate. Weight gain 3 pounds - 189 pounds.  Sleeps well most nights. Averages 8 to 10 hours. Focus and concentration stable. Completing tasks. Managing aspects of household. Retired. Denies SI or HI.  Denies AH or VH.   Review of Systems:  Review of Systems  Musculoskeletal: Negative for gait problem.  Neurological: Negative for tremors.  Psychiatric/Behavioral:       Please refer to HPI    Medications: I have reviewed the patient's current medications.  Current Outpatient Medications  Medication Sig Dispense Refill  . Abatacept (ORENCIA IV) Inject into the vein every 30 (thirty) days.    Marland Kitchen acetaminophen (TYLENOL) 325 MG tablet Take 650 mg by mouth every 6 (six) hours as needed.    Marland Kitchen albuterol (PROAIR HFA) 108 (90 Base) MCG/ACT inhaler INHALE 1-2 PUFFS EVERY 6 HOURS AS NEEDED FOR WHEEZING OR SHORTNESS OF BREATH 8.5 g 12  . ARIPiprazole (ABILIFY) 10 MG tablet Take one tablet at bedtime. 90 tablet 1  . benztropine (COGENTIN) 0.5 MG tablet Take 1 tablet (0.5 mg total) by mouth daily. 90 tablet 1  . cholestyramine (QUESTRAN) 4 GM/DOSE powder Take by mouth 3 (three) times daily as needed.     . clonazePAM (KLONOPIN) 1 MG tablet Take 1 tablet (1 mg total) by mouth at bedtime. 90 tablet 0  . fluticasone (FLONASE) 50 MCG/ACT nasal spray Place 1 spray into both nostrils daily. 48 g 4  . Fluticasone-Umeclidin-Vilant (TRELEGY ELLIPTA) 100-62.5-25 MCG/INH AEPB INHALE ONE PUFF BY MOUTH DAILY 60 each 5  . guaiFENesin (MUCUS RELIEF ADULT PO) Take by mouth daily.    Marland Kitchen loratadine (CLARITIN) 10 MG tablet Take 10 mg by mouth daily.    Marland Kitchen losartan-hydrochlorothiazide (HYZAAR) 100-25 MG tablet Take 1 tablet by mouth daily.     . montelukast (SINGULAIR) 10 MG tablet TAKE ONE TABLET BY MOUTH DAILY 90 tablet 2  . nystatin (MYCOSTATIN) 100000 UNIT/ML suspension Take 5 mLs by mouth daily.     . Omeprazole (PRILOSEC PO) Take by mouth 2 (two) times daily.     . Probiotic Product (PROBIOTIC PO) Take 1 tablet by mouth daily.      No current facility-administered medications for this visit.    Medication Side Effects: None  Allergies:  Allergies  Allergen Reactions  . Simponi [Golimumab]     Repeated infections  . Aspirin     REACTION: throat swelling  . Other Other (See Comments)    REACTION: oral sores  . Sulfa Antibiotics     Other reaction(s): Unknown  . Sulfonamide Derivatives     REACTION: oral sores  . Theophyllines     Past Medical History:  Diagnosis Date  . Allergic-infective asthma 12/24/2007   FENO- 08/07/16- 25 Office spirometry 08/07/16- WNL, FEV1  3.59/ 110%, R 0.81   . Asthmatic bronchitis with acute exacerbation 10/15/2016  . Coarse tremors 12/29/2019   Upper extremities, bilateral  . Diplopia 12/29/2019  . Generalized anxiety disorder   . GERD (gastroesophageal reflux disease) 12/24/2007  . Major depressive disorder in remission 07/15/2017  . Mild neurocognitive disorder 09/14/2020  . Polyp of nasal cavity 12/24/2007  . Regular astigmatism of both eyes 12/29/2019  . Rheumatoid arthritis of multiple sites without rheumatoid factor 09/18/2010  . Seasonal and perennial  allergic rhinitis 12/24/2007  . Sinusitis, chronic 12/24/2007   Recurrent acute maxillary and frontal sinusitis    . 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 Multiple sclerosis Father   . Breast cancer Mother   . Breast cancer Sister   . Alcoholism Child        now sober    Social History   Socioeconomic History  . Marital status: Divorced    Spouse name: Not on file  . Number of children: Not on file  . Years of education: 59  . Highest education level: Bachelor's degree (e.g., BA, AB, BS)  Occupational History  . Occupation: Retired  Tobacco Use  . Smoking status: Former Smoker    Packs/day: 1.00    Years: 20.00    Pack years: 20.00    Types: Cigarettes    Quit date: 10/08/1998    Years since quitting: 22.1  . Smokeless tobacco: Former Systems developer    Types: Snuff, Sarina Ser    Quit date: 1988  Vaping Use  . Vaping Use: Never used  Substance and Sexual Activity  . Alcohol use: Yes    Comment: occassional  . Drug use: No  . Sexual activity: Not on file  Other Topics Concern  . Not on file  Social History Narrative  . Not on file   Social Determinants of Health   Financial Resource Strain: Not on file  Food Insecurity: Not on file  Transportation Needs: Not on file  Physical Activity: Not on file  Stress: Not on file  Social Connections: Not on file  Intimate Partner Violence: Not on file    Past Medical History, Surgical history, Social history, and Family history were reviewed and updated as appropriate.   Please see review of systems for further details on the patient's review from today.   Objective:   Physical Exam:  There were no vitals taken for this visit.  Physical Exam Constitutional:      General: He is not in acute distress. Musculoskeletal:        General: No deformity.  Neurological:     Mental Status: He is alert and oriented to person, place, and time.     Coordination:  Coordination normal.  Psychiatric:        Attention and Perception: Attention and perception normal. He does not perceive auditory or visual hallucinations.        Mood and Affect: Mood normal. Mood is not anxious or depressed. Affect is not labile, blunt, angry or inappropriate.        Speech: Speech normal.        Behavior: Behavior normal.        Thought Content: Thought content normal. Thought content is not paranoid or delusional. Thought content does not include homicidal or suicidal ideation. Thought content does not include homicidal or suicidal plan.        Cognition and Memory: Cognition and memory normal.  Judgment: Judgment normal.     Comments: Insight intact     Lab Review:     Component Value Date/Time   NA 138 09/19/2020 1135   K 3.9 09/19/2020 1135   CL 101 09/19/2020 1135   CO2 25 09/19/2020 1135   GLUCOSE 95 09/19/2020 1135   BUN 18 09/19/2020 1135   CREATININE 1.02 09/19/2020 1135   CREATININE 1.10 12/07/2019 0859   CALCIUM 9.7 09/19/2020 1135   PROT 7.3 09/19/2020 1135   ALBUMIN 4.1 09/19/2020 1135   AST 22 09/19/2020 1135   ALT 30 09/19/2020 1135   ALKPHOS 53 09/19/2020 1135   BILITOT 0.8 09/19/2020 1135   GFRNONAA >60 09/19/2020 1135   GFRNONAA 69 12/07/2019 0859   GFRAA >60 05/23/2020 0908   GFRAA 80 12/07/2019 0859       Component Value Date/Time   WBC 7.9 09/19/2020 1135   RBC 4.56 09/19/2020 1135   HGB 14.5 09/19/2020 1135   HCT 43.0 09/19/2020 1135   PLT 264 09/19/2020 1135   MCV 94.3 09/19/2020 1135   MCH 31.8 09/19/2020 1135   MCHC 33.7 09/19/2020 1135   RDW 13.1 09/19/2020 1135   LYMPHSABS 2.0 09/19/2020 1135   MONOABS 0.9 09/19/2020 1135   EOSABS 0.1 09/19/2020 1135   BASOSABS 0.0 09/19/2020 1135    Lithium Lvl  Date Value Ref Range Status  05/23/2017 0.7 0.6 - 1.2 mmol/L Final    Comment:    ** Please note change in unit of measure and reference range(s). **     No results found for: PHENYTOIN, PHENOBARB, VALPROATE,  CBMZ   .res Assessment: Plan:    Plan:  Abilify 10mg  tablet daily  Clonazepam 1mg  at hs Cogentin 0.5mg  at hs - 1/2 tablet   RTC 3 months  Patient advised to contact office with any questions, adverse effects, or acute worsening in signs and symptoms.  Discussed potential benefits, risk, and side effects of benzodiazepines to include potential risk of tolerance and dependence, as well as possible drowsiness. Advised patient not to drive if experiencing drowsiness and to take lowest possible effective dose to minimize risk of dependence and tolerance.  Discussed potential metabolic side effects associated with atypical antipsychotics, as well as potential risk for movement side effects. Advised pt to contact office if movement side effects occur.    There are no diagnoses linked to this encounter.   Please see After Visit Summary for patient specific instructions.  Future Appointments  Date Time Provider [place]  11/14/2020  9:00 AM [facility] None  11/17/2020 11:15 AM Tat, Eustace Quail, DO [facility] None  01/09/2021  9:15 AM Estanislado Pandy, Abel Presto, MD [facility] None    No orders of the defined types were placed in this encounter.   -------------------------------

## 2020-11-14 ENCOUNTER — Ambulatory Visit (HOSPITAL_COMMUNITY)
Admission: RE | Admit: 2020-11-14 | Discharge: 2020-11-14 | Disposition: A | Discharge status: Home / Self Care | Payer: Medicare Other | Source: Ambulatory Visit | Attending: Rheumatology | Admitting: Rheumatology

## 2020-11-14 ENCOUNTER — Other Ambulatory Visit: Payer: Self-pay

## 2020-11-14 DIAGNOSIS — Z79899 Other long term (current) drug therapy: Secondary | ICD-10-CM | POA: Insufficient documentation

## 2020-11-14 DIAGNOSIS — M0609 Rheumatoid arthritis without rheumatoid factor, multiple sites: Secondary | ICD-10-CM | POA: Diagnosis not present

## 2020-11-14 LAB — CBC WITH DIFFERENTIAL/PLATELET
Abs Immature Granulocytes: 0.02 10*3/uL (ref 0.00–0.07)
Basophils Absolute: 0 10*3/uL (ref 0.0–0.1)
Basophils Relative: 1 %
Eosinophils Absolute: 0.1 10*3/uL (ref 0.0–0.5)
Eosinophils Relative: 2 %
HCT: 45.3 % (ref 39.0–52.0)
Hemoglobin: 14.6 g/dL (ref 13.0–17.0)
Immature Granulocytes: 0 %
Lymphocytes Relative: 31 %
Lymphs Abs: 1.7 10*3/uL (ref 0.7–4.0)
MCH: 30.9 pg (ref 26.0–34.0)
MCHC: 32.2 g/dL (ref 30.0–36.0)
MCV: 96 fL (ref 80.0–100.0)
Monocytes Absolute: 0.6 10*3/uL (ref 0.1–1.0)
Monocytes Relative: 12 %
Neutro Abs: 3 10*3/uL (ref 1.7–7.7)
Neutrophils Relative %: 54 %
Platelets: 260 10*3/uL (ref 150–400)
RBC: 4.72 MIL/uL (ref 4.22–5.81)
RDW: 13.1 % (ref 11.5–15.5)
WBC: 5.5 10*3/uL (ref 4.0–10.5)
nRBC: 0 % (ref 0.0–0.2)

## 2020-11-14 LAB — COMPREHENSIVE METABOLIC PANEL
ALT: 44 U/L (ref 0–44)
AST: 28 U/L (ref 15–41)
Albumin: 3.9 g/dL (ref 3.5–5.0)
Alkaline Phosphatase: 51 U/L (ref 38–126)
Anion gap: 11 (ref 5–15)
BUN: 17 mg/dL (ref 8–23)
CO2: 24 mmol/L (ref 22–32)
Calcium: 10 mg/dL (ref 8.9–10.3)
Chloride: 104 mmol/L (ref 98–111)
Creatinine, Ser: 1.13 mg/dL (ref 0.61–1.24)
GFR, Estimated: >60 mL/min (ref >60)
Glucose, Bld: 101 mg/dL — ABNORMAL HIGH (ref 70–99)
Potassium: 3.9 mmol/L (ref 3.5–5.1)
Sodium: 139 mmol/L (ref 135–145)
Total Bilirubin: 0.7 mg/dL (ref 0.3–1.2)
Total Protein: 7.4 g/dL (ref 6.5–8.1)

## 2020-11-14 MED ORDER — ACETAMINOPHEN 325 MG PO TABS: 650.0000 mg | ORAL_TABLET | Freq: Once | ORAL | Status: DC

## 2020-11-14 MED ORDER — DIPHENHYDRAMINE HCL 25 MG PO CAPS: 25.0000 mg | ORAL_CAPSULE | Freq: Once | ORAL | Status: DC

## 2020-11-14 MED ORDER — SODIUM CHLORIDE 0.9 % IV SOLN
750.0000 mg | INTRAVENOUS | Status: AC
  Administered 2020-11-14: 750 mg via INTRAVENOUS
  Filled 2020-11-14: qty 30

## 2020-11-14 NOTE — Progress Notes (Signed)
Assessment/Plan:   1.  Memory change  -Neurocognitive testing demonstrated only MCI, but it was felt to be moderate to severe end of the spectrum.    -Long discussion with patient, neighbor, daughter (on phone in [place]) that he is on many medications that potentially could be contributing (although certainly this may not be the only issue), including his Cogentin, clonazepam, Abilify.  Cogentin is likely the biggest influencer for the change in memory.  In addition, as he gets older, this medication may not be well tolerated in this age group.  I did not recommend that he stop or change any of his medications, but rather talk to his prescribing nurse practitioner about his medication.  This medication presumably is used to control tremor, which is induced by his other medications.  -Patient did recently have CT brain in October, 2021.  This was reviewed and was unremarkable.  -Patient's daughter asked about acetylcholinesterase inhibitors.  Right now, I do not recommend that since he is just diagnosed with MCI (although on the moderate to severe end of the spectrum), and because there are potential medications contributing.  I would rather address the medications potentially contributing to memory change first, if able.  Patient was very agreeable to that, but his daughter was very frustrated by that and expressed that frustration.  I offered her/him a second opinion.  Patient did not want that.  I am happy to refer if he changes his mind.  -Discussed that I do not recommend Viagra right now for the treatment or prevention of Alzheimer's.  There has been a study on it, but right now this is off label.  Further studies need to be done before recommendations can be made.  -Patient asks about Aducanumab.  Discussed research as well as the controversy around this medication.  Discussed indication and fairly weak science/data that led to FDA approval.  Also discussed the rates of cerebral edema/hemmorhage  (Aria-E and Aria-H).  Have discussed that in November, 2020, a panel of experts concluded that a pivotal study involving this medication failed to  demonstrate "strong evidence" and multiple "red flags" with the data analysis.  Discussed cost associated with the medication.  Discussed that it would not be indicated in him right now anyway because he does not have a diagnosis of Alzheimer's dementia.  -Discussed the importance of safe, cardiovascular exercise.  Discussed mental exercise and importance of learning new things.  Discussed importance of staying social and interacting with others.  -Discussed case with Dr. Melvyn Novas.  Repeat neurocog testing in 12-18 mnths   2.  Tremor  -Continues off of lithium, although not as bad.  Patient does not want medication for that. Subjective:   [name] [name] was seen today in follow up for memory change.  My previous records as well as any outside records available were reviewed prior to todays visit.  Pts daughter on phone and neighbor in the office with the patient.  Pt saw Dr. Melvyn Novas on December 8.  Medical records are reviewed.  Neurocognitive testing on December 8 demonstrated MCI, but felt to be on the moderate to severe and of this.  He did mention that his cognitive profile raised concern for presence of a neurodegenerative condition, but the profile is not wholly consistent with it.  Psychiatry nurse practitioner records are reviewed.  Patient told her he was diagnosed with Alzheimer's and he was somewhat down about it.  No medication changes were made in that regard.  Patient asked me about  the use of Viagra for Alzheimer's.  States that he has been taking it for the last 9 days and feels good.    CURRENT MEDICATIONS:  Outpatient Encounter Medications as of 11/17/2020  Medication Sig  . Abatacept (ORENCIA IV) Inject into the vein every 30 (thirty) days.  Marland Kitchen acetaminophen (TYLENOL) 325 MG tablet Take 650 mg by mouth every 6 (six) hours as needed.  Marland Kitchen  albuterol (PROAIR HFA) 108 (90 Base) MCG/ACT inhaler INHALE 1-2 PUFFS EVERY 6 HOURS AS NEEDED FOR WHEEZING OR SHORTNESS OF BREATH  . ARIPiprazole (ABILIFY) 10 MG tablet Take one tablet at bedtime.  . benztropine (COGENTIN) 0.5 MG tablet Take 1 tablet (0.5 mg total) by mouth daily.  . cholestyramine (QUESTRAN) 4 GM/DOSE powder Take by mouth 3 (three) times daily as needed.  . clonazePAM (KLONOPIN) 1 MG tablet Take 1 tablet (1 mg total) by mouth at bedtime.  . fluticasone (FLONASE) 50 MCG/ACT nasal spray Place 1 spray into both nostrils daily.  . Fluticasone-Umeclidin-Vilant (TRELEGY ELLIPTA) 100-62.5-25 MCG/INH AEPB INHALE ONE PUFF BY MOUTH DAILY  . guaiFENesin (MUCUS RELIEF ADULT PO) Take by mouth daily.  Marland Kitchen loratadine (CLARITIN) 10 MG tablet Take 10 mg by mouth daily.  Marland Kitchen losartan-hydrochlorothiazide (HYZAAR) 100-25 MG tablet Take 1 tablet by mouth daily.   . montelukast (SINGULAIR) 10 MG tablet TAKE ONE TABLET BY MOUTH DAILY  . nystatin (MYCOSTATIN) 100000 UNIT/ML suspension Take 5 mLs by mouth daily. prn  . Omeprazole (PRILOSEC PO) Take by mouth 2 (two) times daily.   . Probiotic Product (PROBIOTIC PO) Take 1 tablet by mouth daily.   . [DISCONTINUED] acetaminophen (TYLENOL) tablet 650 mg   . [DISCONTINUED] diphenhydrAMINE (BENADRYL) capsule 25 mg    No facility-administered encounter medications on file as of 11/17/2020.     Objective:   PHYSICAL EXAMINATION:    VITALS:   Vitals:   11/17/20 1107  BP: 116/72  Pulse: 71  SpO2: 98%  Weight: 193 lb (87.5 kg)  Height: 5\' 8"  (1.727 m)    100% of the visit was spent in counseling.  Total time spent on today's visit was 50 minutes, including both face-to-face time and nonface-to-face time.  Time included that spent on review of records (prior notes available to me/labs/imaging if pertinent), discussing treatment and goals, answering patient's questions and coordinating care.  Cc:  Wenda Low, MD

## 2020-11-14 NOTE — Progress Notes (Signed)
CBC and CMP are normal.

## 2020-11-17 ENCOUNTER — Encounter: Payer: Self-pay | Admitting: Neurology

## 2020-11-17 ENCOUNTER — Other Ambulatory Visit: Payer: Self-pay

## 2020-11-17 ENCOUNTER — Ambulatory Visit (INDEPENDENT_AMBULATORY_CARE_PROVIDER_SITE_OTHER): Payer: Medicare Other | Admitting: Neurology

## 2020-11-17 VITALS — BP 116/72 | HR 71 | Ht 68.0 in | Wt 193.0 lb

## 2020-11-17 DIAGNOSIS — R251 Tremor, unspecified: Secondary | ICD-10-CM | POA: Diagnosis not present

## 2020-11-17 DIAGNOSIS — G3184 Mild cognitive impairment, so stated: Secondary | ICD-10-CM | POA: Diagnosis not present

## 2020-11-21 ENCOUNTER — Encounter: Payer: Self-pay | Admitting: Adult Health

## 2020-11-21 ENCOUNTER — Ambulatory Visit (INDEPENDENT_AMBULATORY_CARE_PROVIDER_SITE_OTHER): Payer: Medicare Other | Admitting: Adult Health

## 2020-11-21 ENCOUNTER — Other Ambulatory Visit: Payer: Self-pay

## 2020-11-21 DIAGNOSIS — F331 Major depressive disorder, recurrent, moderate: Secondary | ICD-10-CM

## 2020-11-21 DIAGNOSIS — F39 Unspecified mood [affective] disorder: Secondary | ICD-10-CM | POA: Diagnosis not present

## 2020-11-21 DIAGNOSIS — M069 Rheumatoid arthritis, unspecified: Secondary | ICD-10-CM | POA: Diagnosis not present

## 2020-11-21 DIAGNOSIS — F319 Bipolar disorder, unspecified: Secondary | ICD-10-CM | POA: Diagnosis not present

## 2020-11-21 DIAGNOSIS — G4700 Insomnia, unspecified: Secondary | ICD-10-CM | POA: Diagnosis not present

## 2020-11-21 DIAGNOSIS — K219 Gastro-esophageal reflux disease without esophagitis: Secondary | ICD-10-CM | POA: Diagnosis not present

## 2020-11-21 DIAGNOSIS — J4520 Mild intermittent asthma, uncomplicated: Secondary | ICD-10-CM | POA: Diagnosis not present

## 2020-11-21 DIAGNOSIS — F411 Generalized anxiety disorder: Secondary | ICD-10-CM

## 2020-11-21 DIAGNOSIS — J45909 Unspecified asthma, uncomplicated: Secondary | ICD-10-CM | POA: Diagnosis not present

## 2020-11-21 DIAGNOSIS — E785 Hyperlipidemia, unspecified: Secondary | ICD-10-CM | POA: Diagnosis not present

## 2020-11-21 DIAGNOSIS — I10 Essential (primary) hypertension: Secondary | ICD-10-CM | POA: Diagnosis not present

## 2020-11-21 DIAGNOSIS — F329 Major depressive disorder, single episode, unspecified: Secondary | ICD-10-CM | POA: Diagnosis not present

## 2020-11-21 NOTE — Progress Notes (Signed)
[name] [name] [id number] [birth year] 70 y.o.  Subjective:   Patient ID:  [name] [name] is a 70 y.o. (DOB [birth year]) male.  Chief Complaint: No chief complaint on file.   HPI [name] [name] presents to the office today for follow-up of Anxiety, Depression, Mood disorder, and Insomnia.  Describes mood today as "ok". Pleasant. Mood symptoms - reports mild depression - "I have felt upset". Feels anxious at times. Denies irritability. Stating "I've been worried, a little scared". Concerned about "memory" issues. Recent visit with Neurology. Would like to discuss medication changes. Stable interest and motivation. Taking medications as prescribed.  Energy levels stable. Active, has a regular exercise routine - every morning. Enjoys some usual interests and activities. Lives alone. Spending time with dog "Minnie". Talking with children. Appetite adequate. Weight loss 2 pounds - 187 pounds.  Sleeps well most nights. Averages 10 hours. Focus and concentration stable. Completing tasks. Managing aspects of household. Retired. Denies SI or HI.  Denies AH or VH.   Flowsheet Row INFUSION 120 from 11/14/2020 in [place] No Risk      Review of Systems:  Review of Systems  Musculoskeletal: Negative for gait problem.  Neurological: Negative for tremors.  Psychiatric/Behavioral:       Please refer to HPI    Medications: I have reviewed the patient's current medications.  Current Outpatient Medications  Medication Sig Dispense Refill  . Abatacept (ORENCIA IV) Inject into the vein every 30 (thirty) days.    Marland Kitchen acetaminophen (TYLENOL) 325 MG tablet Take 650 mg by mouth every 6 (six) hours as needed.    Marland Kitchen albuterol (PROAIR HFA) 108 (90 Base) MCG/ACT inhaler INHALE 1-2 PUFFS EVERY 6 HOURS AS NEEDED FOR WHEEZING OR SHORTNESS OF BREATH 8.5 g 12  . ARIPiprazole (ABILIFY) 10 MG tablet Take one tablet at bedtime. 90 tablet 1  . benztropine  (COGENTIN) 0.5 MG tablet Take 1 tablet (0.5 mg total) by mouth daily. 90 tablet 1  . cholestyramine (QUESTRAN) 4 GM/DOSE powder Take by mouth 3 (three) times daily as needed.    . clonazePAM (KLONOPIN) 1 MG tablet Take 1 tablet (1 mg total) by mouth at bedtime. 90 tablet 0  . fluticasone (FLONASE) 50 MCG/ACT nasal spray Place 1 spray into both nostrils daily. 48 g 4  . Fluticasone-Umeclidin-Vilant (TRELEGY ELLIPTA) 100-62.5-25 MCG/INH AEPB INHALE ONE PUFF BY MOUTH DAILY 60 each 5  . guaiFENesin (MUCUS RELIEF ADULT PO) Take by mouth daily.    Marland Kitchen loratadine (CLARITIN) 10 MG tablet Take 10 mg by mouth daily.    Marland Kitchen losartan-hydrochlorothiazide (HYZAAR) 100-25 MG tablet Take 1 tablet by mouth daily.     . montelukast (SINGULAIR) 10 MG tablet TAKE ONE TABLET BY MOUTH DAILY 90 tablet 2  . nystatin (MYCOSTATIN) 100000 UNIT/ML suspension Take 5 mLs by mouth daily. prn    . Omeprazole (PRILOSEC PO) Take by mouth 2 (two) times daily.     . Probiotic Product (PROBIOTIC PO) Take 1 tablet by mouth daily.      No current facility-administered medications for this visit.    Medication Side Effects: None  Allergies:  Allergies  Allergen Reactions  . Simponi [Golimumab]     Repeated infections  . Aspirin     REACTION: throat swelling  . Other Other (See Comments)    REACTION: oral sores  . Sulfa Antibiotics     Other reaction(s): Unknown  . Sulfonamide Derivatives     REACTION: oral sores  .  Theophyllines     Past Medical History:  Diagnosis Date  . Allergic-infective asthma 12/24/2007   FENO- 08/07/16- 25 Office spirometry 08/07/16- WNL, FEV1 3.59/ 110%, R 0.81   . Asthmatic bronchitis with acute exacerbation 10/15/2016  . Coarse tremors 12/29/2019   Upper extremities, bilateral  . Diplopia 12/29/2019  . Generalized anxiety disorder   . GERD (gastroesophageal reflux disease) 12/24/2007  . Major depressive disorder in remission 07/15/2017  . Mild neurocognitive disorder 09/14/2020  . Polyp  of nasal cavity 12/24/2007  . Regular astigmatism of both eyes 12/29/2019  . Rheumatoid arthritis of multiple sites without rheumatoid factor 09/18/2010  . Seasonal and perennial allergic rhinitis 12/24/2007  . Sinusitis, chronic 12/24/2007   Recurrent acute maxillary and frontal sinusitis    . 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 Multiple sclerosis Father   . Breast cancer Mother   . Breast cancer Sister   . Alcoholism Child        now sober    Social History   Socioeconomic History  . Marital status: Divorced    Spouse name: Not on file  . Number of children: Not on file  . Years of education: 33  . Highest education level: Bachelor's degree (e.g., BA, AB, BS)  Occupational History  . Occupation: Retired  Tobacco Use  . Smoking status: Former Smoker    Packs/day: 1.00    Years: 20.00    Pack years: 20.00    Types: Cigarettes    Quit date: 10/08/1998    Years since quitting: 22.1  . Smokeless tobacco: Former Systems developer    Types: Snuff, Sarina Ser    Quit date: 1988  Vaping Use  . Vaping Use: Never used  Substance and Sexual Activity  . Alcohol use: Yes    Comment: occassional beer  . Drug use: No  . Sexual activity: Not on file  Other Topics Concern  . Not on file  Social History Narrative  . Not on file   Social Determinants of Health   Financial Resource Strain: Not on file  Food Insecurity: Not on file  Transportation Needs: Not on file  Physical Activity: Not on file  Stress: Not on file  Social Connections: Not on file  Intimate Partner Violence: Not on file    Past Medical History, Surgical history, Social history, and Family history were reviewed and updated as appropriate.   Please see review of systems for further details on the patient's review from today.   Objective:   Physical Exam:  There were no vitals taken for this visit.  Physical Exam Constitutional:      General: He is  not in acute distress. Musculoskeletal:        General: No deformity.  Neurological:     Mental Status: He is alert and oriented to person, place, and time.     Coordination: Coordination normal.  Psychiatric:        Attention and Perception: Attention and perception normal. He does not perceive auditory or visual hallucinations.        Mood and Affect: Mood normal. Mood is not anxious or depressed. Affect is not labile, blunt, angry or inappropriate.        Speech: Speech normal.        Behavior: Behavior normal.        Thought Content: Thought content normal. Thought content is not paranoid or delusional. Thought content does not include homicidal or suicidal  ideation. Thought content does not include homicidal or suicidal plan.        Cognition and Memory: Cognition and memory normal.        Judgment: Judgment normal.     Comments: Insight intact     Lab Review:     Component Value Date/Time   NA 139 11/14/2020 0849   K 3.9 11/14/2020 0849   CL 104 11/14/2020 0849   CO2 24 11/14/2020 0849   GLUCOSE 101 (H) 11/14/2020 0849   BUN 17 11/14/2020 0849   CREATININE 1.13 11/14/2020 0849   CREATININE 1.10 12/07/2019 0859   CALCIUM 10.0 11/14/2020 0849   PROT 7.4 11/14/2020 0849   ALBUMIN 3.9 11/14/2020 0849   AST 28 11/14/2020 0849   ALT 44 11/14/2020 0849   ALKPHOS 51 11/14/2020 0849   BILITOT 0.7 11/14/2020 0849   GFRNONAA >60 11/14/2020 0849   GFRNONAA 69 12/07/2019 0859   GFRAA >60 05/23/2020 0908   GFRAA 80 12/07/2019 0859       Component Value Date/Time   WBC 5.5 11/14/2020 0849   RBC 4.72 11/14/2020 0849   HGB 14.6 11/14/2020 0849   HCT 45.3 11/14/2020 0849   PLT 260 11/14/2020 0849   MCV 96.0 11/14/2020 0849   MCH 30.9 11/14/2020 0849   MCHC 32.2 11/14/2020 0849   RDW 13.1 11/14/2020 0849   LYMPHSABS 1.7 11/14/2020 0849   MONOABS 0.6 11/14/2020 0849   EOSABS 0.1 11/14/2020 0849   BASOSABS 0.0 11/14/2020 0849    Lithium Lvl  Date Value Ref Range Status   05/23/2017 0.7 0.6 - 1.2 mmol/L Final    Comment:    ** Please note change in unit of measure and reference range(s). **     No results found for: PHENYTOIN, PHENOBARB, VALPROATE, CBMZ   .res Assessment: Plan:     Plan:  Abilify 10mg  tablet daily  Reduce Clonazepam 1mg  to 0.5mg  at hs D/C Cogentin 0.25mg  at hs    RTC 4 weeks  Patient advised to contact office with any questions, adverse effects, or acute worsening in signs and symptoms.  Discussed potential benefits, risk, and side effects of benzodiazepines to include potential risk of tolerance and dependence, as well as possible drowsiness. Advised patient not to drive if experiencing drowsiness and to take lowest possible effective dose to minimize risk of dependence and tolerance.  Discussed potential metabolic side effects associated with atypical antipsychotics, as well as potential risk for movement side effects. Advised pt to contact office if movement side effects occur.     Diagnoses and all orders for this visit:  Major depressive disorder, recurrent episode, moderate (HCC)  Episodic mood disorder (HCC)  Insomnia, unspecified type  Generalized anxiety disorder     Please see After Visit Summary for patient specific instructions.  Future Appointments  Date Time Provider [place]  01/09/2021  9:15 AM Bo Merino, MD [facility] None  01/10/2021 11:00 AM [id number] None  02/10/2021  9:00 AM Livier Hendel, Berdie Ogren, NP [facility] None    No orders of the defined types were placed in this encounter.   -------------------------------

## 2020-11-22 ENCOUNTER — Other Ambulatory Visit: Payer: Self-pay | Admitting: Pharmacist

## 2020-11-22 DIAGNOSIS — M0609 Rheumatoid arthritis without rheumatoid factor, multiple sites: Secondary | ICD-10-CM

## 2020-11-22 DIAGNOSIS — Z79899 Other long term (current) drug therapy: Secondary | ICD-10-CM

## 2020-11-22 NOTE — Progress Notes (Signed)
Next infusion scheduled for Orencia on 01/10/21 and due for updated orders.  Dose: Orencia 750 mg every 8 weeks (dosing is spaced out from usual every 4 weeks dosing due to recurrent infections, and patient is doing well on every 8 weeks)  Last Visit: 08/09/20  Last infusion: 11/14/20 Next Visit: 01/09/21 Labs: CBC and CMP were updated on 11/14/20 - both WNL TB Gold: negative on 08/09/20   No recent infections or hospitalizations    Orders placed for Orencia 750 mg every 8 weeks x 2 doses along with premedication of Tylenol and Benadryl.  Standing CBC/CMP orders placed to be drawn with each infusion. Next TB gold due 08/09/2021  Knox Saliva, PharmD, MPH Clinical Pharmacist (Rheumatology and Pulmonology)

## 2020-12-05 DIAGNOSIS — D225 Melanocytic nevi of trunk: Secondary | ICD-10-CM | POA: Diagnosis not present

## 2020-12-05 DIAGNOSIS — L400 Psoriasis vulgaris: Secondary | ICD-10-CM | POA: Diagnosis not present

## 2020-12-05 DIAGNOSIS — L708 Other acne: Secondary | ICD-10-CM | POA: Diagnosis not present

## 2020-12-05 DIAGNOSIS — D1801 Hemangioma of skin and subcutaneous tissue: Secondary | ICD-10-CM | POA: Diagnosis not present

## 2020-12-05 DIAGNOSIS — L814 Other melanin hyperpigmentation: Secondary | ICD-10-CM | POA: Diagnosis not present

## 2020-12-05 DIAGNOSIS — L570 Actinic keratosis: Secondary | ICD-10-CM | POA: Diagnosis not present

## 2020-12-06 DIAGNOSIS — J4520 Mild intermittent asthma, uncomplicated: Secondary | ICD-10-CM | POA: Diagnosis not present

## 2020-12-06 DIAGNOSIS — F331 Major depressive disorder, recurrent, moderate: Secondary | ICD-10-CM | POA: Diagnosis not present

## 2020-12-06 DIAGNOSIS — J309 Allergic rhinitis, unspecified: Secondary | ICD-10-CM | POA: Diagnosis not present

## 2020-12-06 DIAGNOSIS — F319 Bipolar disorder, unspecified: Secondary | ICD-10-CM | POA: Diagnosis not present

## 2020-12-06 DIAGNOSIS — K219 Gastro-esophageal reflux disease without esophagitis: Secondary | ICD-10-CM | POA: Diagnosis not present

## 2020-12-06 DIAGNOSIS — I10 Essential (primary) hypertension: Secondary | ICD-10-CM | POA: Diagnosis not present

## 2020-12-06 DIAGNOSIS — M069 Rheumatoid arthritis, unspecified: Secondary | ICD-10-CM | POA: Diagnosis not present

## 2020-12-06 DIAGNOSIS — R4189 Other symptoms and signs involving cognitive functions and awareness: Secondary | ICD-10-CM | POA: Diagnosis not present

## 2020-12-07 ENCOUNTER — Other Ambulatory Visit: Payer: Self-pay | Admitting: Adult Health

## 2020-12-07 DIAGNOSIS — F411 Generalized anxiety disorder: Secondary | ICD-10-CM

## 2020-12-07 DIAGNOSIS — G4700 Insomnia, unspecified: Secondary | ICD-10-CM

## 2020-12-09 ENCOUNTER — Telehealth: Payer: Self-pay | Admitting: Adult Health

## 2020-12-09 DIAGNOSIS — F411 Generalized anxiety disorder: Secondary | ICD-10-CM

## 2020-12-09 DIAGNOSIS — G4700 Insomnia, unspecified: Secondary | ICD-10-CM

## 2020-12-09 MED ORDER — CLONAZEPAM 1 MG PO TABS: 1.0000 mg | ORAL_TABLET | Freq: Every day | ORAL | 0 refills | Status: DC

## 2020-12-09 NOTE — Telephone Encounter (Signed)
Script sent  

## 2020-12-09 NOTE — Telephone Encounter (Signed)
Pt called and said that he needs a refill on his klonopin 1 mg to be sent to the [facility] in [place]. Next appt 12/19/20

## 2020-12-19 ENCOUNTER — Ambulatory Visit (INDEPENDENT_AMBULATORY_CARE_PROVIDER_SITE_OTHER): Payer: Medicare Other | Admitting: Adult Health

## 2020-12-19 ENCOUNTER — Other Ambulatory Visit: Payer: Self-pay

## 2020-12-19 ENCOUNTER — Encounter: Payer: Self-pay | Admitting: Adult Health

## 2020-12-19 DIAGNOSIS — G4700 Insomnia, unspecified: Secondary | ICD-10-CM

## 2020-12-19 DIAGNOSIS — F411 Generalized anxiety disorder: Secondary | ICD-10-CM

## 2020-12-19 DIAGNOSIS — F39 Unspecified mood [affective] disorder: Secondary | ICD-10-CM

## 2020-12-19 DIAGNOSIS — F331 Major depressive disorder, recurrent, moderate: Secondary | ICD-10-CM | POA: Diagnosis not present

## 2020-12-19 MED ORDER — ARIPIPRAZOLE 5 MG PO TABS: 5.0000 mg | ORAL_TABLET | Freq: Every day | ORAL | 0 refills | Status: DC

## 2020-12-19 MED ORDER — ARIPIPRAZOLE 5 MG PO TABS: 5.0000 mg | ORAL_TABLET | Freq: Every day | ORAL | 1 refills | Status: DC

## 2020-12-19 MED ORDER — ARIPIPRAZOLE 5 MG PO TABS
5.0000 mg | ORAL_TABLET | Freq: Every day | ORAL | 1 refills | Status: DC
Start: 2020-12-19 — End: 2020-12-19

## 2020-12-19 NOTE — Progress Notes (Signed)
MOHD CLEMONS [id number] Oct 02, 1951 70 y.o.  Subjective:   Patient ID:  [name] [name] is a 70 y.o. (DOB [birth year]) male.  Chief Complaint: No chief complaint on file.   HPI HORACE LUKAS presents to the office today for follow-up of anxiety, depression, mood disorder, and Insomnia.  Describes mood today as "ok". Pleasant. Mood symptoms - denies depression, anxiety, or irritability. Stating "I'm doing great" Has stopped the Cogentin and reduced the Clonazepam. Feels more "alert". Stating "I feel better". Meeting "two gentleman" for breakfast". Feels like memory is "better". Stable interest and motivation. Taking medications as prescribed.  Energy levels stable. Active, has a regular exercise routine - every morning. Enjoys some usual interests and activities. Lives alone. Spending time with dog "Minnie". Talking with children. Appetite adequate. Weight loss 6 pounds - 181 pounds. Trying to lose weight. Sleeps well most nights. Averages 8 to 9 hours. Focus and concentration stable. Completing tasks. Managing aspects of household. Retired. Denies SI or HI.  Denies AH or VH.  Flowsheet Row INFUSION 120 from 11/14/2020 in [facility] No Risk      Review of Systems:  Review of Systems  Musculoskeletal: Negative for gait problem.  Neurological: Negative for tremors.  Psychiatric/Behavioral:       Please refer to HPI    Medications: I have reviewed the patient's current medications.  Current Outpatient Medications  Medication Sig Dispense Refill  . ARIPiprazole (ABILIFY) 5 MG tablet Take 1 tablet (5 mg total) by mouth daily. 90 tablet 0  . Abatacept (ORENCIA IV) Inject into the vein every 30 (thirty) days.    Marland Kitchen acetaminophen (TYLENOL) 325 MG tablet Take 650 mg by mouth every 6 (six) hours as needed.    Marland Kitchen albuterol (PROAIR HFA) 108 (90 Base) MCG/ACT inhaler INHALE 1-2 PUFFS EVERY 6 HOURS AS NEEDED FOR WHEEZING OR SHORTNESS  OF BREATH 8.5 g 12  . benztropine (COGENTIN) 0.5 MG tablet Take 1 tablet (0.5 mg total) by mouth daily. 90 tablet 1  . cholestyramine (QUESTRAN) 4 GM/DOSE powder Take by mouth 3 (three) times daily as needed.    . clonazePAM (KLONOPIN) 1 MG tablet Take 1 tablet (1 mg total) by mouth at bedtime. 90 tablet 0  . fluticasone (FLONASE) 50 MCG/ACT nasal spray Place 1 spray into both nostrils daily. 48 g 4  . Fluticasone-Umeclidin-Vilant (TRELEGY ELLIPTA) 100-62.5-25 MCG/INH AEPB INHALE ONE PUFF BY MOUTH DAILY 60 each 5  . guaiFENesin (MUCUS RELIEF ADULT PO) Take by mouth daily.    Marland Kitchen loratadine (CLARITIN) 10 MG tablet Take 10 mg by mouth daily.    Marland Kitchen losartan-hydrochlorothiazide (HYZAAR) 100-25 MG tablet Take 1 tablet by mouth daily.     . montelukast (SINGULAIR) 10 MG tablet TAKE ONE TABLET BY MOUTH DAILY 90 tablet 2  . nystatin (MYCOSTATIN) 100000 UNIT/ML suspension Take 5 mLs by mouth daily. prn    . Omeprazole (PRILOSEC PO) Take by mouth 2 (two) times daily.     . Probiotic Product (PROBIOTIC PO) Take 1 tablet by mouth daily.      No current facility-administered medications for this visit.    Medication Side Effects: None  Allergies:  Allergies  Allergen Reactions  . Simponi [Golimumab]     Repeated infections  . Aspirin     REACTION: throat swelling  . Other Other (See Comments)    REACTION: oral sores  . Sulfa Antibiotics     Other reaction(s): Unknown  . Sulfonamide Derivatives  REACTION: oral sores  . Theophyllines     Past Medical History:  Diagnosis Date  . Allergic-infective asthma 12/24/2007   FENO- 08/07/16- 25 Office spirometry 08/07/16- WNL, FEV1 3.59/ 110%, R 0.81   . Asthmatic bronchitis with acute exacerbation 10/15/2016  . Coarse tremors 12/29/2019   Upper extremities, bilateral  . Diplopia 12/29/2019  . Generalized anxiety disorder   . GERD (gastroesophageal reflux disease) 12/24/2007  . Major depressive disorder in remission 07/15/2017  . Mild  neurocognitive disorder 09/14/2020  . Polyp of nasal cavity 12/24/2007  . Regular astigmatism of both eyes 12/29/2019  . Rheumatoid arthritis of multiple sites without rheumatoid factor 09/18/2010  . Seasonal and perennial allergic rhinitis 12/24/2007  . Sinusitis, chronic 12/24/2007   Recurrent acute maxillary and frontal sinusitis    . 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 Multiple sclerosis Father   . Breast cancer Mother   . Breast cancer Sister   . Alcoholism Child        now sober    Social History   Socioeconomic History  . Marital status: Divorced    Spouse name: Not on file  . Number of children: Not on file  . Years of education: 64  . Highest education level: Bachelor's degree (e.g., BA, AB, BS)  Occupational History  . Occupation: Retired  Tobacco Use  . Smoking status: Former Smoker    Packs/day: 1.00    Years: 20.00    Pack years: 20.00    Types: Cigarettes    Quit date: 10/08/1998    Years since quitting: 22.2  . Smokeless tobacco: Former Systems developer    Types: Snuff, Sarina Ser    Quit date: 1988  Vaping Use  . Vaping Use: Never used  Substance and Sexual Activity  . Alcohol use: Yes    Comment: occassional beer  . Drug use: No  . Sexual activity: Not on file  Other Topics Concern  . Not on file  Social History Narrative  . Not on file   Social Determinants of Health   Financial Resource Strain: Not on file  Food Insecurity: Not on file  Transportation Needs: Not on file  Physical Activity: Not on file  Stress: Not on file  Social Connections: Not on file  Intimate Partner Violence: Not on file    Past Medical History, Surgical history, Social history, and Family history were reviewed and updated as appropriate.   Please see review of systems for further details on the patient's review from today.   Objective:   Physical Exam:  There were no vitals taken for this visit.  Physical  Exam Constitutional:      General: He is not in acute distress. Musculoskeletal:        General: No deformity.  Neurological:     Mental Status: He is alert and oriented to person, place, and time.     Coordination: Coordination normal.  Psychiatric:        Attention and Perception: Attention and perception normal. He does not perceive auditory or visual hallucinations.        Mood and Affect: Mood normal. Mood is not anxious or depressed. Affect is not labile, blunt, angry or inappropriate.        Speech: Speech normal.        Behavior: Behavior normal.        Thought Content: Thought content normal. Thought content is not paranoid or delusional. Thought content does  not include homicidal or suicidal ideation. Thought content does not include homicidal or suicidal plan.        Cognition and Memory: Cognition and memory normal.        Judgment: Judgment normal.     Comments: Insight intact     Lab Review:     Component Value Date/Time   NA 139 11/14/2020 0849   K 3.9 11/14/2020 0849   CL 104 11/14/2020 0849   CO2 24 11/14/2020 0849   GLUCOSE 101 (H) 11/14/2020 0849   BUN 17 11/14/2020 0849   CREATININE 1.13 11/14/2020 0849   CREATININE 1.10 12/07/2019 0859   CALCIUM 10.0 11/14/2020 0849   PROT 7.4 11/14/2020 0849   ALBUMIN 3.9 11/14/2020 0849   AST 28 11/14/2020 0849   ALT 44 11/14/2020 0849   ALKPHOS 51 11/14/2020 0849   BILITOT 0.7 11/14/2020 0849   GFRNONAA >60 11/14/2020 0849   GFRNONAA 69 12/07/2019 0859   GFRAA >60 05/23/2020 0908   GFRAA 80 12/07/2019 0859       Component Value Date/Time   WBC 5.5 11/14/2020 0849   RBC 4.72 11/14/2020 0849   HGB 14.6 11/14/2020 0849   HCT 45.3 11/14/2020 0849   PLT 260 11/14/2020 0849   MCV 96.0 11/14/2020 0849   MCH 30.9 11/14/2020 0849   MCHC 32.2 11/14/2020 0849   RDW 13.1 11/14/2020 0849   LYMPHSABS 1.7 11/14/2020 0849   MONOABS 0.6 11/14/2020 0849   EOSABS 0.1 11/14/2020 0849   BASOSABS 0.0 11/14/2020 0849     Lithium Lvl  Date Value Ref Range Status  05/23/2017 0.7 0.6 - 1.2 mmol/L Final    Comment:    ** Please note change in unit of measure and reference range(s). **     No results found for: PHENYTOIN, PHENOBARB, VALPROATE, CBMZ   .res Assessment: Plan:    Plan:  Abilify 10mg  tablet daily  D/C Clonazepam 0.5mg  at hs D/C Cogentin 0.25mg  at hs    RTC 4 weeks  Patient advised to contact office with any questions, adverse effects, or acute worsening in signs and symptoms.  Discussed potential benefits, risk, and side effects of benzodiazepines to include potential risk of tolerance and dependence, as well as possible drowsiness. Advised patient not to drive if experiencing drowsiness and to take lowest possible effective dose to minimize risk of dependence and tolerance.  Discussed potential metabolic side effects associated with atypical antipsychotics, as well as potential risk for movement side effects. Advised pt to contact office if movement side effects occur.   Diagnoses and all orders for this visit:  Generalized anxiety disorder  Episodic mood disorder (HCC) -     Discontinue: ARIPiprazole (ABILIFY) 5 MG tablet; Take 1 tablet (5 mg total) by mouth daily. -     ARIPiprazole (ABILIFY) 5 MG tablet; Take 1 tablet (5 mg total) by mouth daily.  Major depressive disorder, recurrent episode, moderate (HCC)  Insomnia, unspecified type  Other orders -     Discontinue: ARIPiprazole (ABILIFY) 5 MG tablet; Take 1 tablet (5 mg total) by mouth daily.     Please see After Visit Summary for patient specific instructions.  Future Appointments  Date Time Provider Knies  01/09/2021  9:15 AM Bo Merino, MD [facility] None  01/10/2021 11:00 AM [id number] None  02/10/2021  9:00 AM Woodward Klem, Berdie Ogren, NP [facility] None  12/11/2021  8:30 AM Hazle Coca, PhD [facility] None  12/11/2021  9:30 AM [facility]- NEUROPSYCH TECH [facility] None  12/18/2021 10:00  AM Hazle Coca,  PhD [facility] None    No orders of the defined types were placed in this encounter.   -------------------------------

## 2020-12-20 NOTE — Assessment & Plan Note (Signed)
He is in good hands with Dr Doristine Devoid help, but dementia dx may tip off his tendency to depression. I asked him to discuss this with Dr Lysle Rubens and Dr Tat.

## 2020-12-20 NOTE — Assessment & Plan Note (Signed)
No evident recurrence. 

## 2020-12-20 NOTE — Assessment & Plan Note (Signed)
Suspect he is right, that he is using inhaler more due to anxiety about dementia dx, not bronchospasm. Plan- reassurance and education done. Price compare [employer] vs Trelegy

## 2020-12-26 NOTE — Progress Notes (Signed)
Office Visit Note  Patient: [name] [name]             Date of Birth: [birth year]           MRN: [id number]             PCP: Wenda Low, MD Referring: Wenda Low, MD Visit Date: 01/09/2021 Occupation: @GUAROCC @  Subjective:  Left shoulder pain.   History of Present Illness: TORI CUPPS is a 70 y.o. male with a history of seronegative rheumatoid arthritis.  He has been taking Orencia infusions every 2 months.  He states his infusion is due tomorrow.  He had been doing well without any joint swelling or inflammation.  He states for the last couple of days has been having some discomfort in his right shoulder.  His hoping that it will resolve after the infusion.  He has been very active and exercising on a regular basis.  He does 30 minutes of aerobics every day and also some weights.  He has been working on core strengthening exercises.  He had some intentional weight loss.  Activities of Daily Living:  Patient reports morning stiffness for 30-40 minutes.   Patient Reports nocturnal pain.  Difficulty dressing/grooming: Reports Difficulty climbing stairs: Denies Difficulty getting out of chair: Denies Difficulty using hands for taps, buttons, cutlery, and/or writing: Reports  Review of Systems  Constitutional: Negative for fatigue and night sweats.  HENT: Negative for mouth sores, mouth dryness and nose dryness.   Eyes: Negative for pain, redness, itching, visual disturbance and dryness.  Respiratory: Positive for cough and shortness of breath. Negative for hemoptysis and difficulty breathing.   Cardiovascular: Negative for chest pain, palpitations, hypertension, irregular heartbeat and swelling in legs/feet.  Gastrointestinal: Negative for abdominal pain, blood in stool, constipation and diarrhea.  Endocrine: Negative for increased urination.  Genitourinary: Negative for painful urination.  Musculoskeletal: Positive for arthralgias, joint pain and morning stiffness.  Negative for joint swelling, myalgias, muscle weakness, muscle tenderness and myalgias.  Skin: Negative for color change, rash, hair loss, nodules/bumps, redness, skin tightness, ulcers and sensitivity to sunlight.  Allergic/Immunologic: Positive for susceptible to infections.  Neurological: Positive for memory loss. Negative for dizziness, fainting, numbness, headaches, night sweats and weakness.  Hematological: Negative for swollen glands.  Psychiatric/Behavioral: Negative for depressed mood, confusion and sleep disturbance. The patient is not nervous/anxious.     PMFS History:  Patient Active Problem List   Diagnosis Date Noted  . Mild neurocognitive disorder 09/14/2020  . Generalized anxiety disorder   . Coarse tremors 12/29/2019  . Diplopia 12/29/2019  . Esotropia, intermittent 12/29/2019  . Regular astigmatism of both eyes 12/29/2019  . High risk medication use 05/29/2018  . History of IBS 05/29/2018  . Major depressive disorder in remission 07/15/2017  . 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 Asthmatic bronchitis with acute exacerbation 10/15/2016  . Herpes zoster 08/01/2013  . Rheumatoid arthritis of multiple sites without rheumatoid factor ([place]) 09/18/2010  . Polyp of nasal cavity 12/24/2007  . Sinusitis, chronic 12/24/2007  . Seasonal and perennial allergic rhinitis 12/24/2007  . Allergic-infective asthma 12/24/2007  . GERD (gastroesophageal reflux disease) 12/24/2007    Past Medical History:  Diagnosis Date  . Allergic-infective asthma 12/24/2007   FENO- 08/07/16- 25 Office spirometry 08/07/16- WNL, FEV1 3.59/ 110%, R 0.81   . Asthmatic bronchitis with acute exacerbation 10/15/2016  . Coarse tremors 12/29/2019   Upper extremities, bilateral  . Diplopia 12/29/2019  . Generalized anxiety disorder   . GERD (gastroesophageal  reflux disease) 12/24/2007  . Major depressive disorder in remission 07/15/2017  . Mild neurocognitive disorder 09/14/2020  .  Polyp of nasal cavity 12/24/2007  . Regular astigmatism of both eyes 12/29/2019  . Rheumatoid arthritis of multiple sites without rheumatoid factor 09/18/2010  . Seasonal and perennial allergic rhinitis 12/24/2007  . Sinusitis, chronic 12/24/2007   Recurrent acute maxillary and frontal sinusitis    . 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 Multiple sclerosis Father   . Breast cancer Mother   . Breast cancer Sister   . Alcoholism Child        now sober   Past Surgical History:  Procedure Laterality Date  . CARPAL TUNNEL RELEASE Left   . extensive sinus surgery with ablation of the frontal sinuses    . HERNIA REPAIR    . MOUTH SURGERY  11/2019  . nasal polypectomies    . TOOTH EXTRACTION     Social History   Social History Narrative  . Not on file   Immunization History  Administered Date(s) Administered  . Influenza Split 07/04/2009, 08/02/2011, 07/08/2012, 09/07/2012, 07/06/2013, 07/14/2015, 07/08/2016, 07/04/2020  . Influenza, High Dose Seasonal PF 06/19/2017, 06/13/2018, 06/18/2019, 08/24/2019  . Influenza,inj,Quad PF,6+ Mos 07/15/2014, 06/19/2017  . Influenza-Unspecified 07/25/2016  . PFIZER(Purple Top)SARS-COV-2 Vaccination 11/20/2019, 12/15/2019, 06/17/2020  . Pneumococcal Conjugate-13 05/26/2018  . Pneumococcal Polysaccharide-23 05/27/2006, 07/15/2014  . Td 03/06/2007  . Tdap 08/23/2012  . Zoster 04/24/2016, 05/20/2018, 07/30/2018  . Zoster Recombinat (Shingrix) 05/23/2018, 08/21/2018     Objective: Vital Signs: BP 124/78 (BP Location: Left Arm, Patient Position: Sitting, Cuff Size: Normal)   Pulse 60   Ht 5\' 8"  (1.727 m)   Wt 184 lb 9.6 oz (83.7 kg)   BMI 28.07 kg/m    Physical Exam Vitals and nursing note reviewed.  Constitutional:      Appearance: He is well-developed.  HENT:     Head: Normocephalic and atraumatic.  Eyes:     Conjunctiva/sclera: Conjunctivae normal.     Pupils:  Pupils are equal, round, and reactive to light.  Cardiovascular:     Rate and Rhythm: Normal rate and regular rhythm.     Heart sounds: Normal heart sounds.  Pulmonary:     Effort: Pulmonary effort is normal.     Breath sounds: Normal breath sounds.  Abdominal:     General: Bowel sounds are normal.     Palpations: Abdomen is soft.  Musculoskeletal:     Cervical back: Normal range of motion and neck supple.  Skin:    General: Skin is warm and dry.     Capillary Refill: Capillary refill takes less than 2 seconds.  Neurological:     Mental Status: He is alert and oriented to person, place, and time.  Psychiatric:        Behavior: Behavior normal.      Musculoskeletal Exam: C-spine was in good range of motion.  Shoulder joints were in good range of motion.  He has some discomfort with range of motion of his right shoulder joint but there was no point tenderness.  Elbow joints were in good range of motion.  He has some limitation of left wrist extension.  He had limited extension of his left hand MCP joints.  PIP and DIP thickening was noted.  Ulnar deviation was noted.  No synovitis was noted.  Hip joints and knee joints in good range of motion.  He has bilateral hammertoes with  no synovitis.  CDAI Exam: CDAI Score: 0.5  Patient Global: 3 mm; Provider Global: 2 mm Swollen: 0 ; Tender: 0  Joint Exam 01/09/2021   No joint exam has been documented for this visit   There is currently no information documented on the homunculus. Go to the Rheumatology activity and complete the homunculus joint exam.  Investigation: No additional findings.  Imaging: No results found.  Recent Labs: Lab Results  Component Value Date   WBC 5.5 11/14/2020   HGB 14.6 11/14/2020   PLT 260 11/14/2020   NA 139 11/14/2020   K 3.9 11/14/2020   CL 104 11/14/2020   CO2 24 11/14/2020   GLUCOSE 101 (H) 11/14/2020   BUN 17 11/14/2020   CREATININE 1.13 11/14/2020   BILITOT 0.7 11/14/2020   ALKPHOS 51  11/14/2020   AST 28 11/14/2020   ALT 44 11/14/2020   PROT 7.4 11/14/2020   ALBUMIN 3.9 11/14/2020   CALCIUM 10.0 11/14/2020   GFRAA >60 05/23/2020   QFTBGOLD NEGATIVE 07/16/2017   QFTBGOLDPLUS NEGATIVE 08/09/2020    Speciality Comments: No specialty comments available.  Procedures:  No procedures performed Allergies: Simponi [golimumab], Aspirin, Other, Sulfa antibiotics, Sulfonamide derivatives, and Theophyllines   Assessment / Plan:     Visit Diagnoses: Rheumatoid arthritis of multiple sites without rheumatoid factor (HCC)-he denies any joint pain or joint swelling.  He has been having some discomfort in his left shoulder which she relates to possible working out at the gym on a regular basis.  High risk medication use - Orencia 750 mg IV infusions every 8 weeks-spacing orencia due to recurrent infections.  He has done well with the Orencia every 8 weeks.  His labs are stable.  Labs obtained on November 14, 2020 were reviewed.  CBC and CMP were within normal limits.  TB gold was negative on August 09, 2020.  He will be getting labs again with his infusion this month.  He has been advised to discontinue Orencia infusions in case he develops infection and resume once infection resolves.  I advised him to get the full COVID-19 vaccine (booster) per ACR guidelines.  Instructions were placed in the AVS.  He had Shingrix vaccine and had a reaction to pneumococcal vaccine in the past.  Acute pain of right shoulder-he has been having some discomfort in his right shoulder joint.  He had no point tenderness.  He believes it may be related to the working out he is doing at the gym.  Work-up modifications were discussed.  Have given her a handout on shoulder exercises.  I also offered physical therapy if his symptoms persist.  Primary osteoarthritis of both hands-joint protection was discussed.  He has limited extension in his left wrist joint and also his MCPs.  He has been doing some stretching  exercises.  Muscle strengthening was emphasized.  Coarse tremors-he still has coarse tremors which have improved.  History of gastroesophageal reflux (GERD)-he is on pantoprazole which controls his symptoms quite well.  History of depression-according to the patient his depression is much better on Abilify.  History of asthma  Family history of MS (multiple sclerosis)  Increased risk of heart disease with rheumatoid arthritis was discussed.  He had some intentional weight loss.  He has been working out on a regular basis.  Handout was placed in the AVS.  Orders: No orders of the defined types were placed in this encounter.  No orders of the defined types were placed in this encounter.    Follow-Up  Instructions: Return in about 5 months (around 06/11/2021) for Rheumatoid arthritis.   Bo Merino, MD  Note - This record has been created using Editor, commissioning.  Chart creation errors have been sought, but may not always  have been located. Such creation errors do not reflect on  the standard of medical care.

## 2020-12-28 ENCOUNTER — Telehealth: Payer: Self-pay | Admitting: Adult Health

## 2020-12-28 NOTE — Telephone Encounter (Signed)
Douglass called in today stating that he was taking Ablilify 5mg  per day, but he had a bad mood swing and switched to 10mg  a day instead. He would like a CB to discuss if he should stick to taking the 10mg  or try something else. Ph: [phone].

## 2020-12-28 NOTE — Telephone Encounter (Signed)
Pt informed

## 2020-12-28 NOTE — Telephone Encounter (Signed)
Noted  

## 2020-12-28 NOTE — Telephone Encounter (Signed)
Continue at current dose of Abilify - 10mg  and will discuss further changes at next visit.

## 2020-12-28 NOTE — Telephone Encounter (Signed)
Reviewed

## 2020-12-28 NOTE — Telephone Encounter (Signed)
Please review

## 2021-01-09 ENCOUNTER — Encounter: Payer: Self-pay | Admitting: Rheumatology

## 2021-01-09 ENCOUNTER — Ambulatory Visit (INDEPENDENT_AMBULATORY_CARE_PROVIDER_SITE_OTHER): Payer: Medicare Other | Admitting: Rheumatology

## 2021-01-09 ENCOUNTER — Ambulatory Visit (HOSPITAL_COMMUNITY): Payer: Medicare Other

## 2021-01-09 ENCOUNTER — Other Ambulatory Visit: Payer: Self-pay

## 2021-01-09 VITALS — BP 124/78 | HR 60 | Ht 68.0 in | Wt 184.6 lb

## 2021-01-09 DIAGNOSIS — Z820 Family history of epilepsy and other diseases of the nervous system: Secondary | ICD-10-CM | POA: Diagnosis not present

## 2021-01-09 DIAGNOSIS — M7062 Trochanteric bursitis, left hip: Secondary | ICD-10-CM

## 2021-01-09 DIAGNOSIS — Z8709 Personal history of other diseases of the respiratory system: Secondary | ICD-10-CM

## 2021-01-09 DIAGNOSIS — Z8659 Personal history of other mental and behavioral disorders: Secondary | ICD-10-CM

## 2021-01-09 DIAGNOSIS — M19041 Primary osteoarthritis, right hand: Secondary | ICD-10-CM

## 2021-01-09 DIAGNOSIS — M19042 Primary osteoarthritis, left hand: Secondary | ICD-10-CM

## 2021-01-09 DIAGNOSIS — Z8719 Personal history of other diseases of the digestive system: Secondary | ICD-10-CM

## 2021-01-09 DIAGNOSIS — M0609 Rheumatoid arthritis without rheumatoid factor, multiple sites: Secondary | ICD-10-CM | POA: Diagnosis not present

## 2021-01-09 DIAGNOSIS — Z79899 Other long term (current) drug therapy: Secondary | ICD-10-CM

## 2021-01-09 DIAGNOSIS — M25511 Pain in right shoulder: Secondary | ICD-10-CM

## 2021-01-09 DIAGNOSIS — G252 Other specified forms of tremor: Secondary | ICD-10-CM

## 2021-01-09 NOTE — Patient Instructions (Addendum)
COVID-19 vaccine recommendations:   COVID-19 vaccine is recommended for everyone (unless you are allergic to a vaccine component), even if you are on a medication that suppresses your immune system.   If you are on Orencia IV infusions- time vaccination administration so that the first COVID-19 vaccination will occur four weeks after the infusion and postpone the subsequent infusion by one week.   The recommendations are if you are on a immunosuppressive agent you should get first 3 doses of COVID-19 vaccination 1 month apart and then a fourth dose 3 months after the third dose  Do not take Tylenol or any anti-inflammatory medications (NSAIDs) 24 hours prior to the COVID-19 vaccination.   There is no direct evidence about the efficacy of the COVID-19 vaccine in individuals who are on medications that suppress the immune system.   Even if you are fully vaccinated, and you are on any medications that suppress your immune system, please continue to wear a mask, maintain at least six feet social distance and practice hand hygiene.   If you develop a COVID-19 infection, please contact your PCP or our office to determine if you need monoclonal antibody infusion.  The booster vaccine is now available for immunocompromised patients.   Please see the following web sites for updated information.   [email or web address]-Patient-Resources.[email or web address]  Heart Disease Prevention   Your inflammatory disease increases your risk of heart disease which includes heart attack, stroke, atrial fibrillation (irregular heartbeats), high blood pressure, heart failure and atherosclerosis (plaque in the arteries).  It is important to reduce your risk by:   . Keep blood pressure, cholesterol, and blood sugar at healthy levels   . Smoking Cessation   . Maintain a healthy weight  o BMI 20-25   . Eat a healthy diet  o Plenty of fresh fruit, vegetables, and whole grains  o Limit  saturated fats, foods high in sodium, and added sugars  o DASH and Mediterranean diet   . Increase physical activity  o Recommend moderate physically activity for 150 minutes per week/ 30 minutes a day for five days a week These can be broken up into three separate ten-minute sessions during the day.   . Reduce Stress  . Meditation, slow breathing exercises, yoga, coloring books  . Dental visits twice a year    Shoulder Exercises Ask your health care provider which exercises are safe for you. Do exercises exactly as told by your health care provider and adjust them as directed. It is normal to feel mild stretching, pulling, tightness, or discomfort as you do these exercises. Stop right away if you feel sudden pain or your pain gets worse. Do not begin these exercises until told by your health care provider. Stretching exercises External rotation and abduction This exercise is sometimes called corner stretch. This exercise rotates your arm outward (external rotation) and moves your arm out from your body (abduction). 1. Stand in a doorway with one of your feet slightly in front of the other. This is called a staggered stance. If you cannot reach your forearms to the door frame, stand facing a corner of a room. 2. Choose one of the following positions as told by your health care provider: ? Place your hands and forearms on the door frame above your head. ? Place your hands and forearms on the door frame at the height of your head. ? Place your hands on the door frame at the height of your elbows. 3. Slowly move your weight onto your front foot  until you feel a stretch across your chest and in the front of your shoulders. Keep your head and chest upright and keep your abdominal muscles tight. 4. Hold for __________ seconds. 5. To release the stretch, shift your weight to your back foot. Repeat __________ times. Complete this exercise __________ times a day.   Extension, standing 1. Stand and  hold a broomstick, a cane, or a similar object behind your back. ? Your hands should be a little wider than shoulder width apart. ? Your palms should face away from your back. 2. Keeping your elbows straight and your shoulder muscles relaxed, move the stick away from your body until you feel a stretch in your shoulders (extension). ? Avoid shrugging your shoulders while you move the stick. Keep your shoulder blades tucked down toward the middle of your back. 3. Hold for __________ seconds. 4. Slowly return to the starting position. Repeat __________ times. Complete this exercise __________ times a day. Range-of-motion exercises Pendulum 1. Stand near a wall or a surface that you can hold onto for balance. 2. Bend at the waist and let your left / right arm hang straight down. Use your other arm to support you. Keep your back straight and do not lock your knees. 3. Relax your left / right arm and shoulder muscles, and move your hips and your trunk so your left / right arm swings freely. Your arm should swing because of the motion of your body, not because you are using your arm or shoulder muscles. 4. Keep moving your hips and trunk so your arm swings in the following directions, as told by your health care provider: ? Side to side. ? Forward and backward. ? In clockwise and counterclockwise circles. 5. Continue each motion for __________ seconds, or for as long as told by your health care provider. 6. Slowly return to the starting position. Repeat __________ times. Complete this exercise __________ times a day.   Shoulder flexion, standing 1. Stand and hold a broomstick, a cane, or a similar object. Place your hands a little more than shoulder width apart on the object. Your left / right hand should be palm up, and your other hand should be palm down. 2. Keep your elbow straight and your shoulder muscles relaxed. Push the stick up with your healthy arm to raise your left / right arm in front of  your body, and then over your head until you feel a stretch in your shoulder (flexion). ? Avoid shrugging your shoulder while you raise your arm. Keep your shoulder blade tucked down toward the middle of your back. 3. Hold for __________ seconds. 4. Slowly return to the starting position. Repeat __________ times. Complete this exercise __________ times a day.   Shoulder abduction, standing 1. Stand and hold a broomstick, a cane, or a similar object. Place your hands a little more than shoulder width apart on the object. Your left / right hand should be palm up, and your other hand should be palm down. 2. Keep your elbow straight and your shoulder muscles relaxed. Push the object across your body toward your left / right side. Raise your left / right arm to the side of your body (abduction) until you feel a stretch in your shoulder. ? Do not raise your arm above shoulder height unless your health care provider tells you to do that. ? If directed, raise your arm over your head. ? Avoid shrugging your shoulder while you raise your arm. Keep your shoulder blade  tucked down toward the middle of your back. 3. Hold for __________ seconds. 4. Slowly return to the starting position. Repeat __________ times. Complete this exercise __________ times a day. Internal rotation 1. Place your left / right hand behind your back, palm up. 2. Use your other hand to dangle an exercise band, a towel, or a similar object over your shoulder. Grasp the band with your left / right hand so you are holding on to both ends. 3. Gently pull up on the band until you feel a stretch in the front of your left / right shoulder. The movement of your arm toward the center of your body is called internal rotation. ? Avoid shrugging your shoulder while you raise your arm. Keep your shoulder blade tucked down toward the middle of your back. 4. Hold for __________ seconds. 5. Release the stretch by letting go of the band and lowering  your hands. Repeat __________ times. Complete this exercise __________ times a day.   Strengthening exercises External rotation 1. Sit in a stable chair without armrests. 2. Secure an exercise band to a stable object at elbow height on your left / right side. 3. Place a soft object, such as a folded towel or a small pillow, between your left / right upper arm and your body to move your elbow about 4 inches (10 cm) away from your side. 4. Hold the end of the exercise band so it is tight and there is no slack. 5. Keeping your elbow pressed against the soft object, slowly move your forearm out, away from your abdomen (external rotation). Keep your body steady so only your forearm moves. 6. Hold for __________ seconds. 7. Slowly return to the starting position. Repeat __________ times. Complete this exercise __________ times a day.   Shoulder abduction 1. Sit in a stable chair without armrests, or stand up. 2. Hold a __________ weight in your left / right hand, or hold an exercise band with both hands. 3. Start with your arms straight down and your left / right palm facing in, toward your body. 4. Slowly lift your left / right hand out to your side (abduction). Do not lift your hand above shoulder height unless your health care provider tells you that this is safe. ? Keep your arms straight. ? Avoid shrugging your shoulder while you do this movement. Keep your shoulder blade tucked down toward the middle of your back. 5. Hold for __________ seconds. 6. Slowly lower your arm, and return to the starting position. Repeat __________ times. Complete this exercise __________ times a day.   Shoulder extension 1. Sit in a stable chair without armrests, or stand up. 2. Secure an exercise band to a stable object in front of you so it is at shoulder height. 3. Hold one end of the exercise band in each hand. Your palms should face each other. 4. Straighten your elbows and lift your hands up to shoulder  height. 5. Step back, away from the secured end of the exercise band, until the band is tight and there is no slack. 6. Squeeze your shoulder blades together as you pull your hands down to the sides of your thighs (extension). Stop when your hands are straight down by your sides. Do not let your hands go behind your body. 7. Hold for __________ seconds. 8. Slowly return to the starting position. Repeat __________ times. Complete this exercise __________ times a day. Shoulder row 1. Sit in a stable chair without armrests, or stand  up. 2. Secure an exercise band to a stable object in front of you so it is at waist height. 3. Hold one end of the exercise band in each hand. Position your palms so that your thumbs are facing the ceiling (neutral position). 4. Bend each of your elbows to a 90-degree angle (right angle) and keep your upper arms at your sides. 5. Step back until the band is tight and there is no slack. 6. Slowly pull your elbows back behind you. 7. Hold for __________ seconds. 8. Slowly return to the starting position. Repeat __________ times. Complete this exercise __________ times a day. Shoulder press-ups 1. Sit in a stable chair that has armrests. Sit upright, with your feet flat on the floor. 2. Put your hands on the armrests so your elbows are bent and your fingers are pointing forward. Your hands should be about even with the sides of your body. 3. Push down on the armrests and use your arms to lift yourself off the chair. Straighten your elbows and lift yourself up as much as you comfortably can. ? Move your shoulder blades down, and avoid letting your shoulders move up toward your ears. ? Keep your feet on the ground. As you get stronger, your feet should support less of your body weight as you lift yourself up. 4. Hold for __________ seconds. 5. Slowly lower yourself back into the chair. Repeat __________ times. Complete this exercise __________ times a day.   Wall  push-ups 1. Stand so you are facing a stable wall. Your feet should be about one arm-length away from the wall. 2. Lean forward and place your palms on the wall at shoulder height. 3. Keep your feet flat on the floor as you bend your elbows and lean forward toward the wall. 4. Hold for __________ seconds. 5. Straighten your elbows to push yourself back to the starting position. Repeat __________ times. Complete this exercise __________ times a day.   This information is not intended to replace advice given to you by your health care provider. Make sure you discuss any questions you have with your health care provider. Document Revised: 01/16/2019 Document Reviewed: 10/24/2018 Elsevier Patient Education  2021 [employer].

## 2021-01-10 ENCOUNTER — Other Ambulatory Visit: Payer: Self-pay

## 2021-01-10 ENCOUNTER — Ambulatory Visit (HOSPITAL_COMMUNITY)
Admission: RE | Admit: 2021-01-10 | Discharge: 2021-01-10 | Disposition: A | Discharge status: Home / Self Care | Payer: Medicare Other | Source: Ambulatory Visit | Attending: Rheumatology | Admitting: Rheumatology

## 2021-01-10 ENCOUNTER — Telehealth: Payer: Self-pay

## 2021-01-10 DIAGNOSIS — Z79899 Other long term (current) drug therapy: Secondary | ICD-10-CM | POA: Diagnosis not present

## 2021-01-10 DIAGNOSIS — M0609 Rheumatoid arthritis without rheumatoid factor, multiple sites: Secondary | ICD-10-CM | POA: Insufficient documentation

## 2021-01-10 LAB — COMPREHENSIVE METABOLIC PANEL
ALT: 26 U/L (ref 0–44)
AST: 36 U/L (ref 15–41)
Albumin: 4 g/dL (ref 3.5–5.0)
Alkaline Phosphatase: 49 U/L (ref 38–126)
Anion gap: 5 (ref 5–15)
BUN: 16 mg/dL (ref 8–23)
CO2: 26 mmol/L (ref 22–32)
Calcium: 9.4 mg/dL (ref 8.9–10.3)
Chloride: 106 mmol/L (ref 98–111)
Creatinine, Ser: 1 mg/dL (ref 0.61–1.24)
GFR, Estimated: >60 mL/min (ref >60)
Glucose, Bld: 108 mg/dL — ABNORMAL HIGH (ref 70–99)
Potassium: 4.4 mmol/L (ref 3.5–5.1)
Sodium: 137 mmol/L (ref 135–145)
Total Bilirubin: 0.6 mg/dL (ref 0.3–1.2)
Total Protein: 7.1 g/dL (ref 6.5–8.1)

## 2021-01-10 LAB — CBC WITH DIFFERENTIAL/PLATELET
Abs Immature Granulocytes: 0.01 10*3/uL (ref 0.00–0.07)
Basophils Absolute: 0 10*3/uL (ref 0.0–0.1)
Basophils Relative: 1 %
Eosinophils Absolute: 0.1 10*3/uL (ref 0.0–0.5)
Eosinophils Relative: 2 %
HCT: 41.7 % (ref 39.0–52.0)
Hemoglobin: 13.8 g/dL (ref 13.0–17.0)
Immature Granulocytes: 0 %
Lymphocytes Relative: 33 %
Lymphs Abs: 1.6 10*3/uL (ref 0.7–4.0)
MCH: 31.2 pg (ref 26.0–34.0)
MCHC: 33.1 g/dL (ref 30.0–36.0)
MCV: 94.1 fL (ref 80.0–100.0)
Monocytes Absolute: 0.5 10*3/uL (ref 0.1–1.0)
Monocytes Relative: 10 %
Neutro Abs: 2.7 10*3/uL (ref 1.7–7.7)
Neutrophils Relative %: 54 %
Platelets: 281 10*3/uL (ref 150–400)
RBC: 4.43 MIL/uL (ref 4.22–5.81)
RDW: 13.1 % (ref 11.5–15.5)
WBC: 5 10*3/uL (ref 4.0–10.5)
nRBC: 0 % (ref 0.0–0.2)

## 2021-01-10 MED ORDER — SODIUM CHLORIDE 0.9 % IV SOLN
750.0000 mg | INTRAVENOUS | Status: DC
  Administered 2021-01-10: 750 mg via INTRAVENOUS
  Filled 2021-01-10: qty 30

## 2021-01-10 MED ORDER — DIPHENHYDRAMINE HCL 25 MG PO CAPS
ORAL_CAPSULE | ORAL | Status: AC
  Filled 2021-01-10: qty 1

## 2021-01-10 MED ORDER — DIPHENHYDRAMINE HCL 25 MG PO CAPS
25.0000 mg | ORAL_CAPSULE | ORAL | Status: DC
  Administered 2021-01-10: 25 mg via ORAL

## 2021-01-10 MED ORDER — ACETAMINOPHEN 325 MG PO TABS
ORAL_TABLET | ORAL | Status: AC
  Filled 2021-01-10: qty 2

## 2021-01-10 MED ORDER — ACETAMINOPHEN 325 MG PO TABS
650.0000 mg | ORAL_TABLET | ORAL | Status: DC
  Administered 2021-01-10: 650 mg via ORAL

## 2021-01-10 NOTE — Telephone Encounter (Signed)
Called.

## 2021-01-10 NOTE — Progress Notes (Signed)
CBC with diff WNL

## 2021-01-10 NOTE — Telephone Encounter (Signed)
Patient left a voicemail stating he was returning a call to the office regarding his labwork results.

## 2021-01-10 NOTE — Progress Notes (Signed)
Glucose is 108. Rest of CMP WNL

## 2021-01-13 DIAGNOSIS — K219 Gastro-esophageal reflux disease without esophagitis: Secondary | ICD-10-CM | POA: Diagnosis not present

## 2021-01-13 DIAGNOSIS — F329 Major depressive disorder, single episode, unspecified: Secondary | ICD-10-CM | POA: Diagnosis not present

## 2021-01-13 DIAGNOSIS — J4520 Mild intermittent asthma, uncomplicated: Secondary | ICD-10-CM | POA: Diagnosis not present

## 2021-01-13 DIAGNOSIS — I10 Essential (primary) hypertension: Secondary | ICD-10-CM | POA: Diagnosis not present

## 2021-01-13 DIAGNOSIS — E785 Hyperlipidemia, unspecified: Secondary | ICD-10-CM | POA: Diagnosis not present

## 2021-01-13 DIAGNOSIS — F319 Bipolar disorder, unspecified: Secondary | ICD-10-CM | POA: Diagnosis not present

## 2021-01-13 DIAGNOSIS — J45909 Unspecified asthma, uncomplicated: Secondary | ICD-10-CM | POA: Diagnosis not present

## 2021-01-13 DIAGNOSIS — F331 Major depressive disorder, recurrent, moderate: Secondary | ICD-10-CM | POA: Diagnosis not present

## 2021-01-13 DIAGNOSIS — M069 Rheumatoid arthritis, unspecified: Secondary | ICD-10-CM | POA: Diagnosis not present

## 2021-01-23 ENCOUNTER — Telehealth: Payer: Self-pay | Admitting: Internal Medicine

## 2021-01-23 NOTE — Telephone Encounter (Signed)
Left message for patient to call back  

## 2021-01-23 NOTE — Telephone Encounter (Signed)
Spoke with the pt  He is c/o cough with green sputum, green nasal d/c, facial pain x 3 days  He states no CP, wheezing, increased SOB, fever, aches  He did have chills off and on  He states has had 3 covid vaccines  He is still taking his flonase, singulair, claritin, trelegy  Please advise, thanks!  Allergies  Allergen Reactions  . Simponi [Golimumab]     Repeated infections  . Aspirin     REACTION: throat swelling  . Other Other (See Comments)    REACTION: oral sores  . Sulfa Antibiotics     Other reaction(s): Unknown  . Sulfonamide Derivatives     REACTION: oral sores  . Theophyllines    Current Outpatient Medications on File Prior to Visit  Medication Sig Dispense Refill  . Abatacept (ORENCIA IV) Inject into the vein every 30 (thirty) days.    Marland Kitchen acetaminophen (TYLENOL) 325 MG tablet Take 650 mg by mouth every 6 (six) hours as needed.    Marland Kitchen albuterol (PROAIR HFA) 108 (90 Base) MCG/ACT inhaler INHALE 1-2 PUFFS EVERY 6 HOURS AS NEEDED FOR WHEEZING OR SHORTNESS OF BREATH 8.5 g 12  . ARIPiprazole (ABILIFY) 5 MG tablet Take 1 tablet (5 mg total) by mouth daily. 90 tablet 0  . fluticasone (FLONASE) 50 MCG/ACT nasal spray Place 1 spray into both nostrils daily. 48 g 4  . Fluticasone-Umeclidin-Vilant (TRELEGY ELLIPTA) 100-62.5-25 MCG/INH AEPB INHALE ONE PUFF BY MOUTH DAILY 60 each 5  . guaiFENesin (MUCUS RELIEF ADULT PO) Take by mouth daily.    Marland Kitchen loratadine (CLARITIN) 10 MG tablet Take 10 mg by mouth daily.    Marland Kitchen losartan-hydrochlorothiazide (HYZAAR) 100-25 MG tablet Take 1 tablet by mouth daily.     . montelukast (SINGULAIR) 10 MG tablet TAKE ONE TABLET BY MOUTH DAILY 90 tablet 2  . nystatin (MYCOSTATIN) 100000 UNIT/ML suspension Take 5 mLs by mouth daily. prn    . Omeprazole (PRILOSEC PO) Take by mouth 2 (two) times daily.     . pantoprazole (PROTONIX) 20 MG tablet Take 20 mg by mouth 2 (two) times daily.    . Probiotic Product (PROBIOTIC PO) Take 1 tablet by mouth daily.      No  current facility-administered medications on file prior to visit.

## 2021-01-23 NOTE — Telephone Encounter (Signed)
Patient is returning phone call. Patient phone number is [phone].

## 2021-01-23 NOTE — Telephone Encounter (Signed)
Please send augmentin 875 mg, # 14, 1 twice daily Thanks

## 2021-01-23 NOTE — Telephone Encounter (Signed)
Documentation was already received from [facility] to send Rx for augmentin 875mg  #14 to the pharmacy for pt.  Tried to call pt but unable to reach. Left message for him to return call. I have pended the Rx for [place] to send to pharmacy once pt calls abck.

## 2021-01-23 NOTE — Telephone Encounter (Signed)
Primary Pulmonologist: Young Last office visit and with whom: 11/03/20 Young What do we see them for (pulmonary problems): asthmatic bronchitis Last OV assessment/plan:   Assessment & Plan Note by Deneise Lever, MD at 12/20/2020 7:39 PM  Author: Deneise Lever, MD Author Type: Physician Filed: 12/20/2020 7:40 PM  Note Status: Written Cosign: Cosign Not Required Encounter Date: 11/03/2020  Problem: Mild neurocognitive disorder  Editor: Deneise Lever, MD (Physician)               He is in good hands with Dr Doristine Devoid help, but dementia dx may tip off his tendency to depression. I asked him to discuss this with Dr Lysle Rubens and Dr Tat.       Assessment & Plan Note by Deneise Lever, MD at 12/20/2020 7:38 PM  Author: Deneise Lever, MD Author Type: Physician Filed: 12/20/2020 7:38 PM  Note Status: Written Cosign: Cosign Not Required Encounter Date: 11/03/2020  Problem: Polyp of nasal cavity  Editor: Deneise Lever, MD (Physician)               No evident recurrence       Assessment & Plan Note by Deneise Lever, MD at 12/20/2020 7:37 PM  Author: Deneise Lever, MD Author Type: Physician Filed: 12/20/2020 7:38 PM  Note Status: Written Cosign: Cosign Not Required Encounter Date: 11/03/2020  Problem: Asthmatic bronchitis with acute exacerbation  Editor: Deneise Lever, MD (Physician)               Suspect he is right, that he is using inhaler more due to anxiety about dementia dx, not bronchospasm. Plan- reassurance and education done. Price compare [employer] vs Trelegy       Patient Instructions by Deneise Lever, MD at 11/03/2020 9:30 AM  Author: Deneise Lever, MD Author Type: Physician Filed: 11/03/2020 9:49 AM  Note Status: Signed Cosign: Cosign Not Required Encounter Date: 11/03/2020  Editor: Deneise Lever, MD (Physician)               Refill scripts sent for Trelegy and Feliberto Harts  Ask your pharmacy to [facility] with your  Trelegy.  Ask Dr Tat about maintenance sildenafil (Viagra) for dementia- She can Google big study.  Please cal if we can help        Instructions     Return in about 6 months (around 05/03/2021).  Refill scripts sent for Trelegy and Proair hfa  Ask your pharmacy to [facility] with your Trelegy.  Ask Dr Tat about maintenance sildenafil (Viagra) for dementia- She can Google big study.  Please cal if we can help      Reason for call: States he has been having green mucous from his nose when he blows it and coughing up some green mucous when he coughs for the past 5-6 days. He also has pain over his eyebrows and in his cheeks.  States he is getting pimples under his nose from blowing his nose frequently.  He denies any fever, chills, body aches, sob or wheezing.  Denies any sick contacts and lives alone.  He has had his 3 covid vaccines and his flu vaccine.  He is using his trelegy and albuterol as prescribed.  He also does salt water nasal rinses daily.  He is requesting an antibiotic for a sinus infection.  Dr. Annamaria Boots, please advise.  Thank you.    (examples of things to ask: : When did symptoms start? Fever? Cough? Productive?  Color to sputum? More sputum than usual? Wheezing? Have you needed increased oxygen? Are you taking your respiratory medications? What over the counter measures have you tried?)  Allergies  Allergen Reactions  . Simponi [Golimumab]     Repeated infections  . Aspirin     REACTION: throat swelling  . Other Other (See Comments)    REACTION: oral sores  . Sulfa Antibiotics     Other reaction(s): Unknown  . Sulfonamide Derivatives     REACTION: oral sores  . Theophyllines     Immunization History  Administered Date(s) Administered  . Influenza Split 07/04/2009, 08/02/2011, 07/08/2012, 09/07/2012, 07/06/2013, 07/14/2015, 07/08/2016, 07/04/2020  . Influenza, High Dose Seasonal PF 06/19/2017, 06/13/2018, 06/18/2019, 08/24/2019  .  Influenza,inj,Quad PF,6+ Mos 07/15/2014, 06/19/2017  . Influenza-Unspecified 07/25/2016  . PFIZER(Purple Top)SARS-COV-2 Vaccination 11/20/2019, 12/15/2019, 06/17/2020  . Pneumococcal Conjugate-13 05/26/2018  . Pneumococcal Polysaccharide-23 05/27/2006, 07/15/2014  . Td 03/06/2007  . Tdap 08/23/2012  . Zoster 04/24/2016, 05/20/2018, 07/30/2018  . Zoster Recombinat (Shingrix) 05/23/2018, 08/21/2018

## 2021-01-24 MED ORDER — AMOXICILLIN-POT CLAVULANATE 875-125 MG PO TABS: 1.0000 | ORAL_TABLET | Freq: Two times a day (BID) | ORAL | 0 refills | Status: DC

## 2021-01-24 NOTE — Telephone Encounter (Signed)
Spoke with pt, aware of recs.  rx sent to preferred pharmacy.  Nothing further needed at this time- will close encounter.   

## 2021-02-06 NOTE — Progress Notes (Signed)
Office Visit Note  Patient: [name] [name]             Date of Birth: [birth year]           MRN: [id number]             PCP: Wenda Low, MD Referring: Wenda Low, MD Visit Date: 02/07/2021 Occupation: @GUAROCC @  Subjective:  Pain in hands and feet.   History of Present Illness: [name] [name] is a 70 y.o. male with a history of seronegative rheumatoid arthritis, and osteoarthritis.  He has been taking Orencia injections every 8 weeks.  He states recently he has been having increased discomfort in his left hand and his feet.  He continues to have tremors and has difficulty writing.  He states he used to do treadmill on a regular basis but he is having difficulty doing treadmill because he feels weak and also has discomfort in his feet.  He has been walking on a regular basis.  He has been seeing a psychiatrist on a regular basis.  He states he there was suspicion about dementia and Alzheimer's.  The psychiatrist took him off depression medications.  His anxiety and depression has gotten worse.  Tremors are worse.  He is also having insomnia.   Activities of Daily Living:  Patient reports morning stiffness for 1 hour.   Patient Denies nocturnal pain.  Difficulty dressing/grooming: Denies Difficulty climbing stairs: Denies Difficulty getting out of chair: Reports Difficulty using hands for taps, buttons, cutlery, and/or writing: Reports  Review of Systems  Constitutional: Negative for fatigue and night sweats.  HENT: Positive for mouth dryness. Negative for mouth sores and nose dryness.   Eyes: Negative for redness and dryness.  Respiratory: Positive for shortness of breath. Negative for difficulty breathing.        Asthma  Cardiovascular: Negative for chest pain, palpitations, hypertension, irregular heartbeat and swelling in legs/feet.  Gastrointestinal: Negative for constipation and diarrhea.  Endocrine: Negative for increased urination.  Musculoskeletal: Positive for  arthralgias, joint pain and morning stiffness. Negative for joint swelling, myalgias, muscle weakness, muscle tenderness and myalgias.  Skin: Negative for color change, rash, hair loss, nodules/bumps, skin tightness, ulcers and sensitivity to sunlight.  Allergic/Immunologic: Negative for susceptible to infections.  Neurological: Negative for dizziness, fainting, memory loss, night sweats and weakness ( ).  Hematological: Negative for swollen glands.  Psychiatric/Behavioral: Positive for depressed mood and sleep disturbance. The patient is nervous/anxious.     PMFS History:  Patient Active Problem List   Diagnosis Date Noted  . Mild neurocognitive disorder 09/14/2020  . Generalized anxiety disorder   . Coarse tremors 12/29/2019  . Diplopia 12/29/2019  . Esotropia, intermittent 12/29/2019  . Regular astigmatism of both eyes 12/29/2019  . High risk medication use 05/29/2018  . History of IBS 05/29/2018  . Major depressive disorder in remission 07/15/2017  . 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 Asthmatic bronchitis with acute exacerbation 10/15/2016  . Herpes zoster 08/01/2013  . Rheumatoid arthritis of multiple sites without rheumatoid factor ([place]) 09/18/2010  . Polyp of nasal cavity 12/24/2007  . Sinusitis, chronic 12/24/2007  . Seasonal and perennial allergic rhinitis 12/24/2007  . Allergic-infective asthma 12/24/2007  . GERD (gastroesophageal reflux disease) 12/24/2007    Past Medical History:  Diagnosis Date  . Allergic-infective asthma 12/24/2007   FENO- 08/07/16- 25 Office spirometry 08/07/16- WNL, FEV1 3.59/ 110%, R 0.81   . Asthmatic bronchitis with acute exacerbation 10/15/2016  . Coarse tremors 12/29/2019   Upper extremities,  bilateral  . Diplopia 12/29/2019  . Generalized anxiety disorder   . GERD (gastroesophageal reflux disease) 12/24/2007  . Major depressive disorder in remission 07/15/2017  . Mild neurocognitive disorder 09/14/2020  . Polyp of  nasal cavity 12/24/2007  . Regular astigmatism of both eyes 12/29/2019  . Rheumatoid arthritis of multiple sites without rheumatoid factor 09/18/2010  . Seasonal and perennial allergic rhinitis 12/24/2007  . Sinusitis, chronic 12/24/2007   Recurrent acute maxillary and frontal sinusitis    . 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 Multiple sclerosis Father   . Breast cancer Mother   . Breast cancer Sister   . Alcoholism Child        now sober   Past Surgical History:  Procedure Laterality Date  . CARPAL TUNNEL RELEASE Left   . extensive sinus surgery with ablation of the frontal sinuses    . HERNIA REPAIR    . MOUTH SURGERY  11/2019  . nasal polypectomies    . TOOTH EXTRACTION     Social History   Social History Narrative  . Not on file   Immunization History  Administered Date(s) Administered  . Influenza Split 07/04/2009, 08/02/2011, 07/08/2012, 09/07/2012, 07/06/2013, 07/14/2015, 07/08/2016, 07/04/2020  . Influenza, High Dose Seasonal PF 06/19/2017, 06/13/2018, 06/18/2019, 08/24/2019  . Influenza,inj,Quad PF,6+ Mos 07/15/2014, 06/19/2017  . Influenza-Unspecified 07/25/2016  . PFIZER(Purple Top)SARS-COV-2 Vaccination 11/20/2019, 12/15/2019, 06/17/2020  . Pneumococcal Conjugate-13 05/26/2018  . Pneumococcal Polysaccharide-23 05/27/2006, 07/15/2014  . Td 03/06/2007  . Tdap 08/23/2012  . Zoster 04/24/2016, 05/20/2018, 07/30/2018  . Zoster Recombinat (Shingrix) 05/23/2018, 08/21/2018     Objective: Vital Signs: BP 134/86 (BP Location: Left Arm, Patient Position: Sitting, Cuff Size: Normal)   Pulse 66   Ht 5\' 8"  (1.727 m)   Wt 176 lb (79.8 kg)   BMI 26.76 kg/m    Physical Exam Vitals and nursing note reviewed.  Constitutional:      Appearance: He is well-developed.  HENT:     Head: Normocephalic and atraumatic.  Eyes:     Conjunctiva/sclera: Conjunctivae normal.     Pupils: Pupils are equal,  round, and reactive to light.  Cardiovascular:     Rate and Rhythm: Normal rate and regular rhythm.     Heart sounds: Normal heart sounds.  Pulmonary:     Effort: Pulmonary effort is normal.     Breath sounds: Normal breath sounds.  Abdominal:     General: Bowel sounds are normal.     Palpations: Abdomen is soft.  Musculoskeletal:     Cervical back: Normal range of motion and neck supple.  Skin:    General: Skin is warm and dry.     Capillary Refill: Capillary refill takes less than 2 seconds.  Neurological:     Mental Status: He is alert and oriented to person, place, and time.  Psychiatric:        Behavior: Behavior normal.      Musculoskeletal Exam: Spine was in good range of motion.  Shoulder joints, elbow joints, wrist joints with good range of motion.  He has bilateral MCP thickening with no synovitis.  He has contracture in his left hand MCPs.  PIP and DIP prominence was noted with no synovitis.  Hip joints with good range of motion.  Knee joints with good range of motion.  Left foot has hammertoes.  No synovitis was noted over MTPs or PIPs.  CDAI Exam: CDAI Score: 1  Patient Global:  7 mm; Provider Global: 3 mm Swollen: 0 ; Tender: 0  Joint Exam 02/07/2021   No joint exam has been documented for this visit   There is currently no information documented on the homunculus. Go to the Rheumatology activity and complete the homunculus joint exam.  Investigation: No additional findings.  Imaging: No results found.  Recent Labs: Lab Results  Component Value Date   WBC 5.0 01/10/2021   HGB 13.8 01/10/2021   PLT 281 01/10/2021   NA 137 01/10/2021   K 4.4 01/10/2021   CL 106 01/10/2021   CO2 26 01/10/2021   GLUCOSE 108 (H) 01/10/2021   BUN 16 01/10/2021   CREATININE 1.00 01/10/2021   BILITOT 0.6 01/10/2021   ALKPHOS 49 01/10/2021   AST 36 01/10/2021   ALT 26 01/10/2021   PROT 7.1 01/10/2021   ALBUMIN 4.0 01/10/2021   CALCIUM 9.4 01/10/2021   GFRAA >60  05/23/2020   QFTBGOLD NEGATIVE 07/16/2017   QFTBGOLDPLUS NEGATIVE 08/09/2020    Speciality Comments: No specialty comments available.  Procedures:  No procedures performed Allergies: Simponi [golimumab], Aspirin, Other, Sulfa antibiotics, Sulfonamide derivatives, and Theophyllines   Assessment / Plan:     Visit Diagnoses: Rheumatoid arthritis of multiple sites without rheumatoid factor (HCC)-he is complaining of increased pain and discomfort in his hands and feet.  No synovitis was noted.  He believes the discomfort has increased since he has been taking Orencia infusions every 8 weeks.  We will change Orencia infusions to every 4 weeks for the next 3 months to see if he notices any improvement in his symptoms.  If he has frequent infections then we can increase the interval between the dosing.  High risk medication use - Orencia 750 mg IV infusions every 8 weeks-spacing orencia due to recurrent infections.  Labs from April 2022, CBC and CMP with GFR were within normal limits.  Last TB Gold was August 09, 2020.  Pain in both hands -complaints of pain and discomfort in his bilateral hands.  He has increased tremors in his hands since he has been off antidepressants.  I also see increased contractures.  He has been less active.  Plan: XR Hand 2 View Right, XR Hand 2 View Left, x-rays were consistent with rheumatoid arthritis and osteoarthritis.  There was no radiographic progression when compared to the x-rays of 2019.  Sedimentation rate  Primary osteoarthritis of both hands-he has severe osteoarthritis in his bilateral hands.  He would benefit from physical therapy.  Pain in both feet -he complains of increased pain and discomfort in his bilateral feet.  No synovitis was noted.  He has osteoarthritis and hammertoes.  Plan: XR Foot 2 Views Right, XR Foot 2 Views Left.  X-rays were consistent with osteoarthritis.  No radiographic progression was noted when compared to the x-rays of 2021.  Coarse  tremors-his tremors are worse since he has been off anxiety medications.  History of gastroesophageal reflux (GERD)  History of depression-his depression is worse.  History of asthma  Memory loss-patient states that he has been experiencing some memory loss.  His neurologist is concerned about possible dementia.  He was taken off the antidepressants for that reason.  Family history of MS (multiple sclerosis)-he has been followed by the neurologist.  Orders: Orders Placed This Encounter  Procedures  . XR Hand 2 View Right  . XR Hand 2 View Left  . XR Foot 2 Views Right  . XR Foot 2 Views Left  . Sedimentation rate  .  Ambulatory referral to Physical Therapy   No orders of the defined types were placed in this encounter.     Follow-Up Instructions: Return in about 3 months (around 05/10/2021) for Rheumatoid arthritis.   Bo Merino, MD  Note - This record has been created using Editor, commissioning.  Chart creation errors have been sought, but may not always  have been located. Such creation errors do not reflect on  the standard of medical care.

## 2021-02-07 ENCOUNTER — Other Ambulatory Visit: Payer: Self-pay | Admitting: Pharmacist

## 2021-02-07 ENCOUNTER — Encounter: Payer: Self-pay | Admitting: Rheumatology

## 2021-02-07 ENCOUNTER — Ambulatory Visit: Payer: Self-pay

## 2021-02-07 ENCOUNTER — Ambulatory Visit (INDEPENDENT_AMBULATORY_CARE_PROVIDER_SITE_OTHER): Payer: Medicare Other | Admitting: Rheumatology

## 2021-02-07 ENCOUNTER — Other Ambulatory Visit: Payer: Self-pay

## 2021-02-07 VITALS — BP 134/86 | HR 66 | Ht 68.0 in | Wt 176.0 lb

## 2021-02-07 DIAGNOSIS — Z820 Family history of epilepsy and other diseases of the nervous system: Secondary | ICD-10-CM

## 2021-02-07 DIAGNOSIS — M0609 Rheumatoid arthritis without rheumatoid factor, multiple sites: Secondary | ICD-10-CM

## 2021-02-07 DIAGNOSIS — M79642 Pain in left hand: Secondary | ICD-10-CM | POA: Diagnosis not present

## 2021-02-07 DIAGNOSIS — M19041 Primary osteoarthritis, right hand: Secondary | ICD-10-CM | POA: Diagnosis not present

## 2021-02-07 DIAGNOSIS — G252 Other specified forms of tremor: Secondary | ICD-10-CM

## 2021-02-07 DIAGNOSIS — M79671 Pain in right foot: Secondary | ICD-10-CM

## 2021-02-07 DIAGNOSIS — Z8709 Personal history of other diseases of the respiratory system: Secondary | ICD-10-CM

## 2021-02-07 DIAGNOSIS — Z79899 Other long term (current) drug therapy: Secondary | ICD-10-CM | POA: Diagnosis not present

## 2021-02-07 DIAGNOSIS — Z8719 Personal history of other diseases of the digestive system: Secondary | ICD-10-CM | POA: Diagnosis not present

## 2021-02-07 DIAGNOSIS — M79641 Pain in right hand: Secondary | ICD-10-CM | POA: Diagnosis not present

## 2021-02-07 DIAGNOSIS — M19042 Primary osteoarthritis, left hand: Secondary | ICD-10-CM

## 2021-02-07 DIAGNOSIS — Z8659 Personal history of other mental and behavioral disorders: Secondary | ICD-10-CM

## 2021-02-07 DIAGNOSIS — R413 Other amnesia: Secondary | ICD-10-CM

## 2021-02-07 DIAGNOSIS — M79672 Pain in left foot: Secondary | ICD-10-CM

## 2021-02-07 LAB — SEDIMENTATION RATE: Sed Rate: 22 mm/h — ABNORMAL HIGH (ref 0–20)

## 2021-02-07 NOTE — Progress Notes (Signed)
Next infusion scheduled for Orencia on 03/07/21 and due for updated orders. We are changing him to Orencia infusion very 4 weeks so he will have to reschedule his infusion (on or after 02/07/21)  Diagnosis: RA  Dose: Orencia 750mg  every 4 weeks (appropriate based on last recorded weight of 79.8kg)  Last Clinic Visit: 02/07/21 Next Clinic Visit: 06/13/21  Last infusion: 01/10/21 Labs:  - CMP on 01/10/21 was wnl - CBC on 01/10/21 wnl  TB Gold: negative on 08/09/20  Orders placed for Orencia x 2 doses along with premedication of Tylenol and Benadryl to be administered 30 minutes before medication infusion.  Standing CBC with diff/platelet and CMP with GFR orders placed to be drawn every 2 months.  Next TB gold due November 2022  Patient provided with phone number for: [facility] [phone]) Lady Gary at [facility] today with Dr. Talbot Grumbling, PharmD, MPH Clinical Pharmacist (Rheumatology and Pulmonology)

## 2021-02-07 NOTE — Patient Instructions (Addendum)
[facility] Phone number is: [phone] We have placed new Orencia orders so you can reschedule your next infusion  [facility] Neurology- Dr. Carles Collet [phone]

## 2021-02-08 NOTE — Progress Notes (Signed)
Sed rate is only mildly elevated.  It does not indicate a disease flare.

## 2021-02-10 ENCOUNTER — Encounter: Payer: Self-pay | Admitting: Adult Health

## 2021-02-10 ENCOUNTER — Other Ambulatory Visit: Payer: Self-pay

## 2021-02-10 ENCOUNTER — Ambulatory Visit (INDEPENDENT_AMBULATORY_CARE_PROVIDER_SITE_OTHER): Payer: Medicare Other | Admitting: Adult Health

## 2021-02-10 DIAGNOSIS — F411 Generalized anxiety disorder: Secondary | ICD-10-CM

## 2021-02-10 DIAGNOSIS — F331 Major depressive disorder, recurrent, moderate: Secondary | ICD-10-CM | POA: Diagnosis not present

## 2021-02-10 DIAGNOSIS — G4700 Insomnia, unspecified: Secondary | ICD-10-CM | POA: Diagnosis not present

## 2021-02-10 DIAGNOSIS — F39 Unspecified mood [affective] disorder: Secondary | ICD-10-CM

## 2021-02-10 MED ORDER — ARIPIPRAZOLE 10 MG PO TABS
ORAL_TABLET | ORAL | 1 refills | Status: DC
Start: 2021-02-10 — End: 2021-03-10

## 2021-02-10 MED ORDER — PROPRANOLOL HCL 10 MG PO TABS: 10.0000 mg | ORAL_TABLET | Freq: Two times a day (BID) | ORAL | 2 refills | Status: DC

## 2021-02-10 NOTE — Progress Notes (Signed)
[name] [name] [id number] [birth year] 70 y.o.  Subjective:   Patient ID:  [name] [name] is a 70 y.o. (DOB Mar 22, 1951) male.  Chief Complaint: No chief complaint on file.   HPI PRANAY HILBUN presents to the office today for follow-up of  anxiety, depression, mood disorder, and Insomnia.  Describes mood today as "ok". Pleasant. Mood symptoms - reports depression, anxiety, or irritability. Increased anxiety. Stating "I'm not doing as well as I was". Tried reducing the Abilify from 10mg  to 5mg  daily. Stating "I got irritable and mean, so I went back up on it". Remains off the Cogentin and Clonazepam. Tremors are worsening. Stable interest and motivation. Taking medications as prescribed.  Energy levels stable. Active, has a regular exercise routine - every morning. Enjoys some usual interests and activities. Lives alone. Spending time with dog "Minnie". Talking with children. Appetite adequate. Weight loss 13 pounds - 169 pounds. Trying to lose weight. Sleeps well most nights. Averages 6 to 7 hours - waking up with tremors. Focus and concentration stable. Completing tasks. Managing aspects of household. Retired. Denies SI or HI.  Denies AH or VH.    Flowsheet Row INFUSION 120 from 11/14/2020 in [place] No Risk       Review of Systems:  Review of Systems  Musculoskeletal: Negative for gait problem.  Neurological: Negative for tremors.  Psychiatric/Behavioral:       Please refer to HPI    Medications: I have reviewed the patient's current medications.  Current Outpatient Medications  Medication Sig Dispense Refill  . propranolol (INDERAL) 10 MG tablet Take 1 tablet (10 mg total) by mouth 2 (two) times daily. 60 tablet 2  . Abatacept (ORENCIA IV) Inject into the vein every 30 (thirty) days.    Marland Kitchen acetaminophen (TYLENOL) 325 MG tablet Take 650 mg by mouth every 6 (six) hours as needed.    Marland Kitchen albuterol (PROAIR HFA) 108  (90 Base) MCG/ACT inhaler INHALE 1-2 PUFFS EVERY 6 HOURS AS NEEDED FOR WHEEZING OR SHORTNESS OF BREATH 8.5 g 12  . ARIPiprazole (ABILIFY) 10 MG tablet Take one tablet daily. 90 tablet 1  . fluticasone (FLONASE) 50 MCG/ACT nasal spray Place 1 spray into both nostrils daily. 48 g 4  . Fluticasone-Umeclidin-Vilant (TRELEGY ELLIPTA) 100-62.5-25 MCG/INH AEPB INHALE ONE PUFF BY MOUTH DAILY 60 each 5  . guaiFENesin (MUCUS RELIEF ADULT PO) Take by mouth daily.    Marland Kitchen loratadine (CLARITIN) 10 MG tablet Take 10 mg by mouth daily.    Marland Kitchen losartan-hydrochlorothiazide (HYZAAR) 100-25 MG tablet Take 1 tablet by mouth daily.     . montelukast (SINGULAIR) 10 MG tablet TAKE ONE TABLET BY MOUTH DAILY 90 tablet 2  . nystatin (MYCOSTATIN) 100000 UNIT/ML suspension Take 5 mLs by mouth daily. prn    . Omeprazole (PRILOSEC PO) Take by mouth 2 (two) times daily.     . pantoprazole (PROTONIX) 20 MG tablet Take 20 mg by mouth 2 (two) times daily.    . Probiotic Product (PROBIOTIC PO) Take 1 tablet by mouth daily.      No current facility-administered medications for this visit.    Medication Side Effects: None  Allergies:  Allergies  Allergen Reactions  . Simponi [Golimumab]     Repeated infections  . Aspirin     REACTION: throat swelling  . Other Other (See Comments)    REACTION: oral sores Other reaction(s): rash  . Sulfa Antibiotics     Other reaction(s): Unknown  .  Sulfonamide Derivatives     REACTION: oral sores  . Theophyllines     Past Medical History:  Diagnosis Date  . Allergic-infective asthma 12/24/2007   FENO- 08/07/16- 25 Office spirometry 08/07/16- WNL, FEV1 3.59/ 110%, R 0.81   . Asthmatic bronchitis with acute exacerbation 10/15/2016  . Coarse tremors 12/29/2019   Upper extremities, bilateral  . Diplopia 12/29/2019  . Generalized anxiety disorder   . GERD (gastroesophageal reflux disease) 12/24/2007  . Major depressive disorder in remission 07/15/2017  . Mild neurocognitive disorder  09/14/2020  . Polyp of nasal cavity 12/24/2007  . Regular astigmatism of both eyes 12/29/2019  . Rheumatoid arthritis of multiple sites without rheumatoid factor 09/18/2010  . Seasonal and perennial allergic rhinitis 12/24/2007  . Sinusitis, chronic 12/24/2007   Recurrent acute maxillary and frontal sinusitis    . 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   Please see review of systems for further details on the patient's review from today.   Objective:   Physical Exam:  There were no vitals taken for this visit.  Physical Exam Constitutional:      General: He is not in acute distress. Musculoskeletal:        General: No deformity.  Neurological:     Mental Status: He is alert and oriented to person, place, and time.     Coordination: Coordination normal.  Psychiatric:        Attention and Perception: Attention and perception normal. He does not perceive auditory or visual hallucinations.        Mood and Affect: Mood normal. Mood is not anxious or depressed. Affect is not labile, blunt, angry or inappropriate.        Speech: Speech normal.        Behavior: Behavior normal.        Thought Content: Thought content normal. Thought content is not paranoid or delusional. Thought content does not include homicidal or suicidal ideation. Thought content does not include homicidal or suicidal plan.        Cognition and Memory: Cognition and memory normal.        Judgment: Judgment normal.     Comments: Insight intact     Lab Review:     Component Value Date/Time   NA 137 01/10/2021 1052   K 4.4 01/10/2021 1052   CL 106 01/10/2021 1052   CO2 26 01/10/2021 1052   GLUCOSE 108 (H) 01/10/2021 1052   BUN 16 01/10/2021 1052   CREATININE 1.00 01/10/2021 1052   CREATININE 1.10 12/07/2019 0859   CALCIUM 9.4 01/10/2021 1052   PROT 7.1 01/10/2021 1052    ALBUMIN 4.0 01/10/2021 1052   AST 36 01/10/2021 1052   ALT 26 01/10/2021 1052   ALKPHOS 49 01/10/2021 1052   BILITOT 0.6 01/10/2021 1052   GFRNONAA >60 01/10/2021 1052   GFRNONAA 69 12/07/2019 0859   GFRAA >60 05/23/2020 0908   GFRAA 80 12/07/2019 0859       Component Value Date/Time   WBC 5.0 01/10/2021 1052   RBC 4.43 01/10/2021 1052   HGB 13.8 01/10/2021 1052   HCT 41.7 01/10/2021 1052   PLT 281 01/10/2021 1052   MCV 94.1 01/10/2021 1052   MCH 31.2 01/10/2021 1052   MCHC 33.1 01/10/2021 1052   RDW 13.1 01/10/2021 1052   LYMPHSABS 1.6 01/10/2021 1052   MONOABS 0.5 01/10/2021 1052   EOSABS 0.1 01/10/2021 1052  BASOSABS 0.0 01/10/2021 1052    Lithium Lvl  Date Value Ref Range Status  05/23/2017 0.7 0.6 - 1.2 mmol/L Final    Comment:    ** Please note change in unit of measure and reference range(s). **     No results found for: PHENYTOIN, PHENOBARB, VALPROATE, CBMZ   .res Assessment: Plan:    Plan:  Add Propranolol 10mg  BID for tremors Abilify 10mg  tablet daily  D/C Clonazepam 0.5mg  at hs D/C Cogentin 0.25mg  at hs   Consider Risperdal to replace Abilify  Planning to see Dr Tat - tremors    RTC 4 weeks  Patient advised to contact office with any questions, adverse effects, or acute worsening in signs and symptoms.  Discussed potential benefits, risk, and side effects of benzodiazepines to include potential risk of tolerance and dependence, as well as possible drowsiness. Advised patient not to drive if experiencing drowsiness and to take lowest possible effective dose to minimize risk of dependence and tolerance.  Discussed potential metabolic side effects associated with atypical antipsychotics, as well as potential risk for movement side effects. Advised pt to contact office if movement side effects occur.     Diagnoses and all orders for this visit:  Generalized anxiety disorder -     propranolol (INDERAL) 10 MG tablet; Take 1 tablet (10 mg total) by  mouth 2 (two) times daily.  Episodic mood disorder (HCC) -     ARIPiprazole (ABILIFY) 10 MG tablet; Take one tablet daily.  Major depressive disorder, recurrent episode, moderate (HCC)  Insomnia, unspecified type     Please see After Visit Summary for patient specific instructions.  Future Appointments  Date Time Provider [place]  02/23/2021 10:00 AM [facility] None  03/03/2021  8:15 AM Tat, Eustace Quail, DO [facility] None  03/07/2021 11:00 AM [facility]-RM8 [facility] None  03/10/2021 10:00 AM Nitya Cauthon, Berdie Ogren, NP CP-CP None  05/04/2021 10:30 AM Deneise Lever, MD [facility] None  05/09/2021 10:15 AM Bo Merino, MD [facility] None  12/11/2021  8:30 AM Hazle Coca, PhD [facility] None  12/11/2021  9:30 AM LBN- NEUROPSYCH TECH [facility] None  12/18/2021 10:00 AM Hazle Coca, PhD [facility] None    No orders of the defined types were placed in this encounter.   -------------------------------

## 2021-02-23 ENCOUNTER — Encounter (HOSPITAL_COMMUNITY)
Admission: RE | Admit: 2021-02-23 | Discharge: 2021-02-23 | Disposition: A | Discharge status: Home / Self Care | Payer: Medicare Other | Source: Ambulatory Visit | Attending: Rheumatology | Admitting: Rheumatology

## 2021-02-23 ENCOUNTER — Other Ambulatory Visit: Payer: Self-pay

## 2021-02-23 DIAGNOSIS — M0609 Rheumatoid arthritis without rheumatoid factor, multiple sites: Secondary | ICD-10-CM | POA: Insufficient documentation

## 2021-02-23 MED ORDER — SODIUM CHLORIDE 0.9 % IV SOLN
750.0000 mg | INTRAVENOUS | Status: DC
  Administered 2021-02-23: 750 mg via INTRAVENOUS
  Filled 2021-02-23: qty 30

## 2021-02-23 MED ORDER — ACETAMINOPHEN 325 MG PO TABS: 650.0000 mg | ORAL_TABLET | ORAL | Status: DC

## 2021-02-23 MED ORDER — DIPHENHYDRAMINE HCL 25 MG PO CAPS
25.0000 mg | ORAL_CAPSULE | ORAL | Status: DC
Start: 2021-02-23 — End: 2021-02-24

## 2021-02-27 ENCOUNTER — Other Ambulatory Visit: Payer: Self-pay

## 2021-02-27 ENCOUNTER — Ambulatory Visit: Payer: Medicare Other | Attending: Rheumatology

## 2021-02-27 DIAGNOSIS — M79671 Pain in right foot: Secondary | ICD-10-CM | POA: Insufficient documentation

## 2021-02-27 DIAGNOSIS — M79672 Pain in left foot: Secondary | ICD-10-CM | POA: Insufficient documentation

## 2021-02-27 NOTE — Therapy (Signed)
[facility] [address], [place], [address] Phone: [phone]   Fax:  [phone]  Physical Therapy Evaluation  Patient Details  Name: [name] [name] MRN: [id number] Date of Birth: [birth year] Referring Provider (PT): Bo Merino, MD   Encounter Date: 02/27/2021   PT End of Session - 02/27/21 1634    Visit Number 1    Number of Visits 17    Date for PT Re-Evaluation 04/27/21    Authorization Type 1    Authorization Time Period 10    PT Start Time 1635    PT Stop Time 1717    PT Time Calculation (min) 42 min    Activity Tolerance Patient tolerated treatment well    Behavior During Therapy [facility] for tasks assessed/performed           Past Medical History:  Diagnosis Date  . Allergic-infective asthma 12/24/2007   FENO- 08/07/16- 25 Office spirometry 08/07/16- WNL, FEV1 3.59/ 110%, R 0.81   . Asthmatic bronchitis with acute exacerbation 10/15/2016  . Coarse tremors 12/29/2019   Upper extremities, bilateral  . Diplopia 12/29/2019  . Generalized anxiety disorder   . GERD (gastroesophageal reflux disease) 12/24/2007  . Major depressive disorder in remission 07/15/2017  . Mild neurocognitive disorder 09/14/2020  . Polyp of nasal cavity 12/24/2007  . Regular astigmatism of both eyes 12/29/2019  . Rheumatoid arthritis of multiple sites without rheumatoid factor 09/18/2010  . Seasonal and perennial allergic rhinitis 12/24/2007  . Sinusitis, chronic 12/24/2007   Recurrent acute maxillary and frontal sinusitis    . 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 CARPAL TUNNEL RELEASE Left   . extensive sinus surgery with ablation of the frontal sinuses    . HERNIA REPAIR    . MOUTH SURGERY  11/2019  . nasal polypectomies    . TOOTH EXTRACTION      There were no vitals filed for this visit.    Subjective Assessment - 02/27/21  1637    Subjective B foot pain: 2-3 L, R 2/10 currently, 5/10 at most for the past month.    Pertinent History RA, B hand and foot pain. Pt having tremmors. Difficulty coordinating his L hand due to tremmors and arthritis. Has B hand arthritis at all his knuckles/mcp joints. B foot pain L > R. Body feels like it is trying to make his feet go into B PF. Foot pain located at the MTP joints bilaterally L > R. Pt started having tremmors when taking abylify. B feet started bothering him when pt started having tremmors which was about 2 months ago around March 2022.    Patient Stated Goals Wants to get loosened up and get more comfortable with his body.    Currently in Pain? Yes    Pain Score 3     Pain Location Foot    Pain Orientation Left;Right    Pain Onset More than a month ago    Pain Frequency Occasional    Aggravating Factors  walking at the treadmill for about 20-30 minutes, walking for a long time, walking up inclines (especially when it is steep). walking quickly.    Pain Relieving Factors rest, slowing down gait.              [facility] PT Assessment - 02/27/21 1649      Assessment   Medical Diagnosis M06.09 (ICD-10-CM) - Rheumatoid arthritis of multiple sites without rheumatoid  factor ([place])  M19.041,M19.042 (ICD-10-CM) - Primary osteoarthritis of both hands  M79.671,M79.672 (ICD-10-CM) - Pain in both feet    Referring Provider (PT) Bo Merino, MD    Onset Date/Surgical Date 02/15/21      Precautions   Precaution Comments no known precautions      Restrictions   Other Position/Activity Restrictions No known restrictions      Observation/Other Assessments   Focus on Therapeutic Outcomes (FOTO)  Lower leg FOTO 58      Posture/Postural Control   Posture Comments forward neck, B scapular winging, slight L lateral shift, decreased lumbar lordosis,  R hip in slight ER,  R foot in slight pronation   L foot: digits 2-4 in flexion/hammer toe     Strength   Right Hip Extension 4/5    seated manually resisted   Left Hip Extension 4-/5   seated manually resisted   Right Knee Flexion 4/5    Right Knee Extension 4+/5    Left Knee Flexion 4+/5    Left Knee Extension 5/5      Ambulation/Gait   Gait Comments slight antalgic pattern             Objective measurements completed on examination: See above findings.   No latex allergies Blood pressure is controlled  Pain location: toe extensor tendons as well as PIP (proximal interphalangeal) joints.  Pt states having hammer toes for about 2 years  Manual therapy Seated toe extension stretch 2-4th digits  Seated STM L calf targeting the toe flexor muscles Seated STM plantar foot to decrease muscle tension   Response to treatment Pt tolerated session well without aggravation of symptoms  Pt was referred to the occupational therapist who specializes in hands for his hand pain.      Clinical impression Pt is a 70 year old male who came to physical therapy secondary B foot pain L > R along the proximal interphalangeal joints as well as at the extensor digitorum tendons. He also presents with limited extension at his PIP joints, slight antalgic gait pattern and bilateral hip weakness and difficulty ambulating quickly, long distances, negotiating inclines and stairs secondary to feet pain. Pt will benefit from skilled physical therapy services to address the aforementioned deficits.                      PT Education - 02/27/21 1834    Education Details low load long duration toe extension stretchs (pt manually stretches his toes) at home, POC    Person(s) Educated Patient    Methods Explanation;Demonstration;Tactile cues;Verbal cues    Comprehension Verbalized understanding            PT Short Term Goals - 02/27/21 1839      PT SHORT TERM GOAL #1   Title Pt will be independent with his initial HEP to improve toe extension, decrease pain, improve ability to ambulate more comfortably.    Time 3     Period Weeks    Status New    Target Date 03/23/21             PT Long Term Goals - 02/27/21 1840      PT LONG TERM GOAL #1   Title Pt will have a decrease in L and R foot/toe(s) pain to 2/10 or less at most to promote ability to ambulate longer distances, negotiate inclines and stairs more comfortably.    Baseline 5/10 R and L foot/toe(s) pain at most for the past  month (02/27/2021)    Time 8    Period Weeks    Status New    Target Date 04/27/21      PT LONG TERM GOAL #2   Title Pt will report being able to ambulate at the treadmill for 30 minutes with less pain in B feet to promote fitness.    Baseline Increased pain with gait at treadmill for 20-30 minutes (02/27/2021)    Time 8    Period Weeks    Status New    Target Date 04/27/21      PT LONG TERM GOAL #3   Title Pt will improve his FOTO score by at least 10 points as a demonstration of improved function.    Baseline Lower leg FOTO 58 (02/27/2021)    Time 8    Period Weeks    Status New    Target Date 04/27/21                  Plan - 02/27/21 1835    Clinical Impression Statement Pt is a 70 year old male who came to physical therapy secondary B foot pain L > R along the proximal interphalangeal joints as well as at the extensor digitorum tendons. He also presents with limited extension at his PIP joints, slight antalgic gait pattern and bilateral hip weakness and difficulty ambulating quickly, long distances, negotiating inclines and stairs secondary to feet pain. Pt will benefit from skilled physical therapy services to address the aforementioned deficits.    Personal Factors and Comorbidities Age;Comorbidity 3+;Past/Current Experience;Time since onset of injury/illness/exacerbation    Comorbidities Major depressive disorder, RA, mild neurocognitive disorder    Examination-Activity Limitations Locomotion Level;Stairs    Stability/Clinical Decision Making Evolving/Moderate complexity    Clinical Decision  Making Moderate    Rehab Potential Fair    PT Frequency 2x / week    PT Duration 8 weeks    PT Treatment/Interventions Manual techniques;Therapeutic exercise;Therapeutic activities;Electrical Stimulation;Iontophoresis 4mg /ml Dexamethasone;Neuromuscular re-education;Patient/family education    PT Next Visit Plan toe extension, manual technies, strengthening, modalties PRN    Consulted and Agree with Plan of Care Patient           Patient will benefit from skilled therapeutic intervention in order to improve the following deficits and impairments:  Pain,Improper body mechanics,Difficulty walking,Decreased range of motion  Visit Diagnosis: Pain in left foot - Plan: PT plan of care cert/re-cert  Pain in right foot - Plan: PT plan of care cert/re-cert     Problem List Patient Active Problem List   Diagnosis Date Noted  . Mild neurocognitive disorder 09/14/2020  . Generalized anxiety disorder   . Coarse tremors 12/29/2019  . Diplopia 12/29/2019  . Esotropia, intermittent 12/29/2019  . Regular astigmatism of both eyes 12/29/2019  . High risk medication use 05/29/2018  . History of IBS 05/29/2018  . Major depressive disorder in remission 07/15/2017  . 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 Asthmatic bronchitis with acute exacerbation 10/15/2016  . Herpes zoster 08/01/2013  . Rheumatoid arthritis of multiple sites without rheumatoid factor ([place]) 09/18/2010  . Polyp of nasal cavity 12/24/2007  . Sinusitis, chronic 12/24/2007  . Seasonal and perennial allergic rhinitis 12/24/2007  . Allergic-infective asthma 12/24/2007  . GERD (gastroesophageal reflux disease) 12/24/2007   Joneen Boers PT, DPT   02/27/2021, 6:52 PM  [facility] [address], [place], [address] Phone: [phone]   Fax:  [phone]  Name: [name] [name]  MRN: [id number] Date of Birth: 1951-04-17

## 2021-03-01 ENCOUNTER — Other Ambulatory Visit: Payer: Self-pay

## 2021-03-01 ENCOUNTER — Ambulatory Visit: Payer: Medicare Other

## 2021-03-01 DIAGNOSIS — M79671 Pain in right foot: Secondary | ICD-10-CM | POA: Diagnosis not present

## 2021-03-01 DIAGNOSIS — M79672 Pain in left foot: Secondary | ICD-10-CM | POA: Diagnosis not present

## 2021-03-01 NOTE — Therapy (Signed)
[facility] [address], [place], [address] Phone: [phone]   Fax:  [phone]  Physical Therapy Treatment  Patient Details  Name: [name] [name] MRN: [id number] Date of Birth: 06/12/1951 Referring Provider (PT): Bo Merino, MD   Encounter Date: 03/01/2021   PT End of Session - 03/01/21 1328    Visit Number 2    Number of Visits 17    Date for PT Re-Evaluation 04/27/21    Authorization Type 2    Authorization Time Period 10    PT Start Time 1328    PT Stop Time 1414    PT Time Calculation (min) 46 min    Activity Tolerance Patient tolerated treatment well    Behavior During Therapy [facility] for tasks assessed/performed           Past Medical History:  Diagnosis Date  . Allergic-infective asthma 12/24/2007   FENO- 08/07/16- 25 Office spirometry 08/07/16- WNL, FEV1 3.59/ 110%, R 0.81   . Asthmatic bronchitis with acute exacerbation 10/15/2016  . Coarse tremors 12/29/2019   Upper extremities, bilateral  . Diplopia 12/29/2019  . Generalized anxiety disorder   . GERD (gastroesophageal reflux disease) 12/24/2007  . Major depressive disorder in remission 07/15/2017  . Mild neurocognitive disorder 09/14/2020  . Polyp of nasal cavity 12/24/2007  . Regular astigmatism of both eyes 12/29/2019  . Rheumatoid arthritis of multiple sites without rheumatoid factor 09/18/2010  . Seasonal and perennial allergic rhinitis 12/24/2007  . Sinusitis, chronic 12/24/2007   Recurrent acute maxillary and frontal sinusitis    . 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 CARPAL TUNNEL RELEASE Left   . extensive sinus surgery with ablation of the frontal sinuses    . HERNIA REPAIR    . MOUTH SURGERY  11/2019  . nasal polypectomies    . TOOTH EXTRACTION      There were no vitals filed for this visit.   Subjective Assessment - 03/01/21 1329     Subjective L foot felt better after the treatment last session.    Pertinent History RA, B hand and foot pain. Pt having tremmors. Difficulty coordinating his L hand due to tremmors and arthritis. Has B hand arthritis at all his knuckles/mcp joints. B foot pain L > R. Body feels like it is trying to make his feet go into B PF. Foot pain located at the MTP joints bilaterally L > R. Pt started having tremmors when taking abylify. B feet started bothering him when pt started having tremmors which was about 2 months ago around March 2022.    Patient Stated Goals Wants to get loosened up and get more comfortable with his body.    Currently in Pain? No/denies    Pain Onset More than a month ago                                     PT Education - 03/01/21 1414    Education Details ther-ex, HEP    Person(s) Educated Patient    Methods Explanation;Demonstration;Tactile cues;Verbal cues;Handout    Comprehension Verbalized understanding;Returned demonstration           Objective    No latex allergies Blood pressure is controlled  Pain location: toe extensor tendons as well as PIP (proximal interphalangeal) joints.  Pt states having hammer toes for about  2 years  MedbridgeAccess Code [email or web address]   Manual therapy Seated toe extension stretch 2-4th digits  Seated STM L calf targeting the toe flexor muscles Seated STM plantar foot to decrease muscle tension and fascial restritions  improved toe extension ROM observed L foot.     Therapeutic exercise Seated toe extension stretch all toes pressing down on towel 10x3 with 5 second holds   Gait with toe extension from foot flat to push off phase 100 ft  Standing toe extension stretch L at wall 30 seconds x 3  Standing toe dorsiflexion stretch 10x 5 seconds     Improved exercise technique, movement at target joints, use of target muscles after mod verbal, visual, tactile cues.       Response to  treatment Pt states toes look and feel much better after treatment.      Clinical impression Improved toe extension palpated and observed with stretching as well as after soft tissue techniques to decrease fascial restrictions and muscle tension. Applied ROM gains to gait to maintain flexibility. Pt will benefit from continued skilled physical therapy services to decrease pain, improve strength and function.       PT Short Term Goals - 02/27/21 1839      PT SHORT TERM GOAL #1   Title Pt will be independent with his initial HEP to improve toe extension, decrease pain, improve ability to ambulate more comfortably.    Time 3    Period Weeks    Status New    Target Date 03/23/21             PT Long Term Goals - 02/27/21 1840      PT LONG TERM GOAL #1   Title Pt will have a decrease in L and R foot/toe(s) pain to 2/10 or less at most to promote ability to ambulate longer distances, negotiate inclines and stairs more comfortably.    Baseline 5/10 R and L foot/toe(s) pain at most for the past month (02/27/2021)    Time 8    Period Weeks    Status New    Target Date 04/27/21      PT LONG TERM GOAL #2   Title Pt will report being able to ambulate at the treadmill for 30 minutes with less pain in B feet to promote fitness.    Baseline Increased pain with gait at treadmill for 20-30 minutes (02/27/2021)    Time 8    Period Weeks    Status New    Target Date 04/27/21      PT LONG TERM GOAL #3   Title Pt will improve his FOTO score by at least 10 points as a demonstration of improved function.    Baseline Lower leg FOTO 58 (02/27/2021)    Time 8    Period Weeks    Status New    Target Date 04/27/21                 Plan - 03/01/21 1320    Clinical Impression Statement Improved toe extension palpated and observed with stretching as well as after soft tissue techniques to decrease fascial restrictions and muscle tension. Applied ROM gains to gait to maintain flexibility.  Pt will benefit from continued skilled physical therapy services to decrease pain, improve strength and function.    Personal Factors and Comorbidities Age;Comorbidity 3+;Past/Current Experience;Time since onset of injury/illness/exacerbation    Comorbidities Major depressive disorder, RA, mild neurocognitive disorder    Examination-Activity Limitations Locomotion Level;Stairs  Stability/Clinical Decision Making Stable/Uncomplicated    Clinical Decision Making Low    Rehab Potential Fair    PT Frequency 2x / week    PT Duration 8 weeks    PT Treatment/Interventions Manual techniques;Therapeutic exercise;Therapeutic activities;Electrical Stimulation;Iontophoresis 4mg /ml Dexamethasone;Neuromuscular re-education;Patient/family education    PT Next Visit Plan toe extension, manual technies, strengthening, modalties PRN    Consulted and Agree with Plan of Care Patient           Patient will benefit from skilled therapeutic intervention in order to improve the following deficits and impairments:  Pain,Improper body mechanics,Difficulty walking,Decreased range of motion  Visit Diagnosis: Pain in left foot  Pain in right foot     Problem List Patient Active Problem List   Diagnosis Date Noted  . Mild neurocognitive disorder 09/14/2020  . Generalized anxiety disorder   . Coarse tremors 12/29/2019  . Diplopia 12/29/2019  . Esotropia, intermittent 12/29/2019  . Regular astigmatism of both eyes 12/29/2019  . High risk medication use 05/29/2018  . History of IBS 05/29/2018  . Major depressive disorder in remission 07/15/2017  . 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 Asthmatic bronchitis with acute exacerbation 10/15/2016  . Herpes zoster 08/01/2013  . Rheumatoid arthritis of multiple sites without rheumatoid factor ([place]) 09/18/2010  . Polyp of nasal cavity 12/24/2007  . Sinusitis, chronic 12/24/2007  . Seasonal and perennial allergic rhinitis 12/24/2007  .  Allergic-infective asthma 12/24/2007  . GERD (gastroesophageal reflux disease) 12/24/2007    Joneen Boers PT, DPT  03/01/2021, 2:30 PM  [facility] [address] [place], [address] Phone: [phone]   Fax:  [phone]  Name: [name] [name] MRN: [id number] Date of Birth: 11-06-1950

## 2021-03-01 NOTE — Progress Notes (Signed)
Assessment/Plan:   1.  Tremor  -pt has had lithium induced tremor in the past and that is gone now.  Thought potentially he had some baseline essential tremor, but I do not see any evidence of that today.  -pt with very mild abilify induced tremor.  He does not have any other evidence of parkinsonism.  Patient bothered by the tremor.  I will slightly increase his propranolol.  Told him I was not sure if it would help.  If it does not, I really would not add further medication for this tremor.  We will increase propranolol so that he is taking 20 mg twice per day.  He was agreeable to that.  Discussed risk, benefits, and side effects.   2.  MCI  -Had neurocognitive testing with Dr. Melvyn Novas demonstrating MCI.  This was done in February, 2022.  Dr. Melvyn Novas recommended potentially repeating this in 12 to 18 months.  -Patient previously asked about Aducanumab.  Discussed research as well as the controversy around this medication.  Discussed indication and fairly weak science/data that led to FDA approval.  Also discussed the rates of cerebral edema/hemmorhage (Aria-E and Aria-H).  Have discussed that in November, 2020, a panel of experts concluded that a pivotal study involving this medication failed to  demonstrate "strong evidence" and multiple "red flags" with the data analysis.  Discussed cost associated with the medication.  Discussed that it would not be indicated in him right now anyway because he does not have a diagnosis of Alzheimer's dementia.  -Patient now off of Cogentin and clonazepam, which may have been contributing to memory change as well.  He has repeat testing scheduled for March and he is eager to see how he does off of medication  3.  Possible akathisia  -He describes this, but I did not really see it clinically on exam.  We discussed that, if present, there would also be a side effect of Abilify.  He will talk to his psychiatrist about this. Subjective:   [name] [name] was seen  today in follow up for tremor.  When I saw the patient last visit, he was off of lithium.  This likely just exacerbated his baseline essential tremor.  He was still on Abilify (delusions).  He did go off of the clonazepam and Cogentin because of memory change.  He presents today to discuss tremor.  Tremor got worse getting worse off of cogentin and klonopin but memory change and cognition got better.  He notes tremor with bathing/washing hair.  He can write checks.  He has no trouble eating.  Mostly, he notes the tremor when first awakening in the morning.  He is clear that the tremor is only at rest.  Tremor is worse on the L than the R.  He is R handed.  He notes most of the tremor at rest.  Anxiety makes it worse.   Psychiatry notes are reviewed from May 6.  Notes indicate that patient did try to decrease the Abilify to 5 mg, but did not do as well, but he was able to decrease it from 10 mg to 7.5 mg.  Separately, he also complains of inability to sit in one place for a long time.  He will have to get up and move.  Prior medications: Clonazepam, Cogentin; propranolol, 10 mg twice per day (on currently)  CURRENT MEDICATIONS:  Outpatient Encounter Medications as of 03/03/2021  Medication Sig  . Abatacept (ORENCIA IV) Inject into the vein every 30 (  thirty) days.  Marland Kitchen acetaminophen (TYLENOL) 325 MG tablet Take 650 mg by mouth every 6 (six) hours as needed.  Marland Kitchen albuterol (PROAIR HFA) 108 (90 Base) MCG/ACT inhaler INHALE 1-2 PUFFS EVERY 6 HOURS AS NEEDED FOR WHEEZING OR SHORTNESS OF BREATH  . ARIPiprazole (ABILIFY) 10 MG tablet Take one tablet daily.  . fluticasone (FLONASE) 50 MCG/ACT nasal spray Place 1 spray into both nostrils daily.  . Fluticasone-Umeclidin-Vilant (TRELEGY ELLIPTA) 100-62.5-25 MCG/INH AEPB INHALE ONE PUFF BY MOUTH DAILY  . guaiFENesin (MUCUS RELIEF ADULT PO) Take by mouth daily.  Marland Kitchen loratadine (CLARITIN) 10 MG tablet Take 10 mg by mouth daily.  Marland Kitchen losartan-hydrochlorothiazide (HYZAAR)  100-25 MG tablet Take 1 tablet by mouth daily.   . montelukast (SINGULAIR) 10 MG tablet TAKE ONE TABLET BY MOUTH DAILY  . nystatin (MYCOSTATIN) 100000 UNIT/ML suspension Take 5 mLs by mouth daily. prn  . Omeprazole (PRILOSEC PO) Take by mouth 2 (two) times daily.   . pantoprazole (PROTONIX) 20 MG tablet Take 20 mg by mouth 2 (two) times daily.  . Probiotic Product (PROBIOTIC PO) Take 1 tablet by mouth daily.   . propranolol (INDERAL) 10 MG tablet Take 1 tablet (10 mg total) by mouth 2 (two) times daily.   No facility-administered encounter medications on file as of 03/03/2021.     Objective:   PHYSICAL EXAMINATION:    VITALS:   Vitals:   03/03/21 0757  BP: 122/62  Pulse: 67  SpO2: 98%  Weight: 165 lb (74.8 kg)  Height: 5\' 8"  (1.727 m)    GEN:  The patient appears stated age and is in NAD. HEENT:  Normocephalic, atraumatic.  The mucous membranes are moist. The superficial temporal arteries are without ropiness or tenderness. CV:  RRR Lungs:  CTAB Neck/HEME:  There are no carotid bruits bilaterally.  Neurological examination:  Orientation: The patient is alert and oriented x3. Cranial nerves: There is good facial symmetry. The speech is fluent and clear. Soft palate rises symmetrically and there is no tongue deviation. Hearing is intact to conversational tone. Sensation: Sensation is intact to light touch throughout Motor: Strength is 5/5 in the bilateral upper and lower extremities.   Shoulder shrug is equal and symmetric.  There is no pronator drift.  Movement examination: Tone: There is normal tone in the RUE.  There is normal tone in the LUE.  There is normal tone in the RLE.  There is normal tone in the LLE.  Abnormal movements: Very rare rest tremor in the left upper extremity.  I could not even get this out with distraction procedures, but was able to see it at the very end of the visit when he was resting his left arm on the chair.  There was no postural tremor.  There  was no intention tremor.  He had no trouble with Archimedes spirals (this was markedly improved compared to when he was on lithium). Coordination:  There is no decremation with RAM's, with any form of RAMS, including alternating supination and pronation of the forearm, hand opening and closing, finger taps, heel taps and toe taps. Gait and Station: The patient has no difficulty arising out of a deep-seated chair without the use of the hands. The patient's stride length is good.     Total time spent on today's visit was 31 minutes, including both face-to-face time and nonface-to-face time.  Time included that spent on review of records (prior notes available to me/labs/imaging if pertinent), discussing treatment and goals, answering patient's questions and coordinating  care.  Cc:  Wenda Low, MD

## 2021-03-01 NOTE — Patient Instructions (Addendum)
Access Code: [email or web address] URL: [email or web address] Date: 03/01/2021 Prepared by: Joneen Boers  Exercises Standing Toe Dorsiflexion Stretch - 1 x daily - 7 x weekly - 3 sets - 10 reps - 5 seconds hold     Seated toe extension stretch all toes pressing down on towel 10x3 with 5 second holds   Gait with toe extension from foot flat to push off phase 100 ft  Standing toe extension stretch L at wall 30 seconds x 3

## 2021-03-03 ENCOUNTER — Other Ambulatory Visit: Payer: Self-pay

## 2021-03-03 ENCOUNTER — Encounter: Payer: Self-pay | Admitting: Neurology

## 2021-03-03 ENCOUNTER — Ambulatory Visit (INDEPENDENT_AMBULATORY_CARE_PROVIDER_SITE_OTHER): Payer: Medicare Other | Admitting: Neurology

## 2021-03-03 VITALS — BP 122/62 | HR 67 | Ht 68.0 in | Wt 165.0 lb

## 2021-03-03 DIAGNOSIS — G2571 Drug induced akathisia: Secondary | ICD-10-CM | POA: Diagnosis not present

## 2021-03-03 DIAGNOSIS — T43505A Adverse effect of unspecified antipsychotics and neuroleptics, initial encounter: Secondary | ICD-10-CM

## 2021-03-03 MED ORDER — PROPRANOLOL HCL 20 MG PO TABS: 20.0000 mg | ORAL_TABLET | Freq: Two times a day (BID) | ORAL | 1 refills | Status: DC

## 2021-03-07 ENCOUNTER — Encounter (HOSPITAL_COMMUNITY): Payer: Medicare Other

## 2021-03-08 ENCOUNTER — Ambulatory Visit: Payer: Medicare Other

## 2021-03-09 ENCOUNTER — Encounter: Payer: Self-pay | Admitting: Occupational Therapy

## 2021-03-09 ENCOUNTER — Ambulatory Visit: Payer: Medicare Other | Admitting: Occupational Therapy

## 2021-03-09 ENCOUNTER — Other Ambulatory Visit: Payer: Self-pay

## 2021-03-09 ENCOUNTER — Ambulatory Visit: Payer: Medicare Other | Attending: Rheumatology

## 2021-03-09 DIAGNOSIS — M79642 Pain in left hand: Secondary | ICD-10-CM

## 2021-03-09 DIAGNOSIS — M25642 Stiffness of left hand, not elsewhere classified: Secondary | ICD-10-CM | POA: Diagnosis not present

## 2021-03-09 DIAGNOSIS — M79671 Pain in right foot: Secondary | ICD-10-CM | POA: Diagnosis not present

## 2021-03-09 DIAGNOSIS — M79672 Pain in left foot: Secondary | ICD-10-CM | POA: Diagnosis not present

## 2021-03-09 NOTE — Therapy (Signed)
[facility] [address], [place], [address] Phone: [phone]   Fax:  [phone]  Physical Therapy Treatment  Patient Details  Name: [name] [name] MRN: [id number] Date of Birth: 08-Nov-1950 Referring Provider (PT): Bo Merino, MD   Encounter Date: 03/09/2021   PT End of Session - 03/09/21 0940    Visit Number 3    Number of Visits 17    Date for PT Re-Evaluation 04/27/21    Authorization Type Traditional Medicare    Authorization Time Period 02/27/21-04/27/21    PT Start Time 0932    PT Stop Time 1010    PT Time Calculation (min) 38 min    Activity Tolerance Patient tolerated treatment well    Behavior During Therapy [facility] for tasks assessed/performed           Past Medical History:  Diagnosis Date  . Allergic-infective asthma 12/24/2007   FENO- 08/07/16- 25 Office spirometry 08/07/16- WNL, FEV1 3.59/ 110%, R 0.81   . Asthmatic bronchitis with acute exacerbation 10/15/2016  . Coarse tremors 12/29/2019   Upper extremities, bilateral  . Diplopia 12/29/2019  . Generalized anxiety disorder   . GERD (gastroesophageal reflux disease) 12/24/2007  . Major depressive disorder in remission 07/15/2017  . Mild neurocognitive disorder 09/14/2020  . Polyp of nasal cavity 12/24/2007  . Regular astigmatism of both eyes 12/29/2019  . Rheumatoid arthritis of multiple sites without rheumatoid factor 09/18/2010  . Seasonal and perennial allergic rhinitis 12/24/2007  . Sinusitis, chronic 12/24/2007   Recurrent acute maxillary and frontal sinusitis    . 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 CARPAL TUNNEL RELEASE Left   . extensive sinus surgery with ablation of the frontal sinuses    . HERNIA REPAIR    . MOUTH SURGERY  11/2019  . nasal polypectomies    . TOOTH EXTRACTION      There were no vitals filed for this visit.    Subjective Assessment - 03/09/21 0935    Subjective Pt reports >than recommended HEP compliance as it seems to be very helpful. Pt reports no pain today. BB dose change without any adverse affects.    Pertinent History RA, B hand and foot pain. Pt having tremmors. Difficulty coordinating his L hand due to tremmors and arthritis. Has B hand arthritis at all his knuckles/mcp joints. B foot pain L > R. Body feels like it is trying to make his feet go into B PF. Foot pain located at the MTP joints bilaterally L > R. Pt started having tremmors when taking abylify. B feet started bothering him when pt started having tremmors which was about 2 months ago around March 2022.    Currently in Pain? No/denies          INTERVENTION THIS DATE: -seated LLE heel slides ankle ROM x 3 minutes -seated LLE heel slides with toe extension stretch 5x20sec -seated LLE rotational heel slides x2 minutes (foot flat on slider)  -barefoot standing all-toe extension 15x5secH  *continued LLE short toe flexor dominance, no observed active extension of short toes -airex barefoot standing tandem heel raises 1x25, BUE support on chair (limited height, needs cues) *obtains more passive short toe extension  *seated recovery -heels on airex, forefoot on floor bilat ankle DF 1x15 (noted LLE weakness, decreased excursion -firm surface all toe extension 1x15 -foam surface heel raises 1x25   -standing ankle dorsiflexion stretch 3x30sec   -tandem  stance balance on treadmill 5x10sec bilat, hands ad lib, but cued for hands free      PT Short Term Goals - 02/27/21 1839      PT SHORT TERM GOAL #1   Title Pt will be independent with his initial HEP to improve toe extension, decrease pain, improve ability to ambulate more comfortably.    Time 3    Period Weeks    Status New    Target Date 03/23/21             PT Long Term Goals - 02/27/21 1840      PT LONG TERM GOAL #1   Title Pt will have a decrease in L and R  foot/toe(s) pain to 2/10 or less at most to promote ability to ambulate longer distances, negotiate inclines and stairs more comfortably.    Baseline 5/10 R and L foot/toe(s) pain at most for the past month (02/27/2021)    Time 8    Period Weeks    Status New    Target Date 04/27/21      PT LONG TERM GOAL #2   Title Pt will report being able to ambulate at the treadmill for 30 minutes with less pain in B feet to promote fitness.    Baseline Increased pain with gait at treadmill for 20-30 minutes (02/27/2021)    Time 8    Period Weeks    Status New    Target Date 04/27/21      PT LONG TERM GOAL #3   Title Pt will improve his FOTO score by at least 10 points as a demonstration of improved function.    Baseline Lower leg FOTO 58 (02/27/2021)    Time 8    Period Weeks    Status New    Target Date 04/27/21                 Plan - 03/09/21 0949    Clinical Impression Statement Continued with current plan, P/ROM of foot/ankle joints, activation training and strengthening. Pt tolerates session well, continues to show weakness of left anterior compartment, short toe extensor dominance. > activation of long toe extensors is more typical in active ankle dorsiflexion. Finished up with tandem balance training. Pt has slightly elevated falls anxiety even with 2 rails available for PRN support. Left foot PIP joints remain hypomobile, 4th digit being more pronounced. Pt continued to make excellent progress toward goals in general.    Personal Factors and Comorbidities Age;Comorbidity 3+;Past/Current Experience;Time since onset of injury/illness/exacerbation    Comorbidities Major depressive disorder, RA, mild neurocognitive disorder    Examination-Activity Limitations Locomotion Level;Stairs    Stability/Clinical Decision Making Stable/Uncomplicated    Clinical Decision Making Low    Rehab Potential Fair    PT Frequency 2x / week    PT Duration 8 weeks    PT Treatment/Interventions Manual  techniques;Therapeutic exercise;Therapeutic activities;Electrical Stimulation;Iontophoresis 4mg /ml Dexamethasone;Neuromuscular re-education;Patient/family education    PT Next Visit Plan toe extension, manual technies, strengthening, modalties PRN    PT Home Exercise Plan No updates today    Consulted and Agree with Plan of Care Patient           Patient will benefit from skilled therapeutic intervention in order to improve the following deficits and impairments:  Pain,Improper body mechanics,Difficulty walking,Decreased range of motion  Visit Diagnosis: Pain in left foot  Pain in right foot     Problem List Patient Active Problem List   Diagnosis Date Noted  . Mild  neurocognitive disorder 09/14/2020  . Generalized anxiety disorder   . Coarse tremors 12/29/2019  . Diplopia 12/29/2019  . Esotropia, intermittent 12/29/2019  . Regular astigmatism of both eyes 12/29/2019  . High risk medication use 05/29/2018  . History of IBS 05/29/2018  . Major depressive disorder in remission 07/15/2017  . 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 Asthmatic bronchitis with acute exacerbation 10/15/2016  . Herpes zoster 08/01/2013  . Rheumatoid arthritis of multiple sites without rheumatoid factor ([place]) 09/18/2010  . Polyp of nasal cavity 12/24/2007  . Sinusitis, chronic 12/24/2007  . Seasonal and perennial allergic rhinitis 12/24/2007  . Allergic-infective asthma 12/24/2007  . GERD (gastroesophageal reflux disease) 12/24/2007   10:10 AM, 03/09/21 Etta Grandchild, PT, DPT Physical Therapist - [place] [phone] (Office)    Bennie Scaff C 03/09/2021, 9:51 AM  [facility] [address] [place], [address] Phone: [phone]   Fax:  [phone]  Name: [name] [name] MRN: [id number] Date of Birth: [birth year]

## 2021-03-09 NOTE — Therapy (Signed)
[facility] [address] [place], [address] Phone: [phone]   Fax:  [phone]  Occupational Therapy Evaluation  Patient Details  Name: [name] [name] MRN: [id number] Date of Birth: [birth year] Referring Provider (OT): Deveshwar   Encounter Date: 03/09/2021   OT End of Session - 03/09/21 1759    Visit Number 1    Number of Visits 4    Date for OT Re-Evaluation 04/06/21    OT Start Time 1030    OT Stop Time 1115    OT Time Calculation (min) 45 min    Activity Tolerance Patient tolerated treatment well    Behavior During Therapy [facility] for tasks assessed/performed           Past Medical History:  Diagnosis Date  . Allergic-infective asthma 12/24/2007   FENO- 08/07/16- 25 Office spirometry 08/07/16- WNL, FEV1 3.59/ 110%, R 0.81   . Asthmatic bronchitis with acute exacerbation 10/15/2016  . Coarse tremors 12/29/2019   Upper extremities, bilateral  . Diplopia 12/29/2019  . Generalized anxiety disorder   . GERD (gastroesophageal reflux disease) 12/24/2007  . Major depressive disorder in remission 07/15/2017  . Mild neurocognitive disorder 09/14/2020  . Polyp of nasal cavity 12/24/2007  . Regular astigmatism of both eyes 12/29/2019  . Rheumatoid arthritis of multiple sites without rheumatoid factor 09/18/2010  . Seasonal and perennial allergic rhinitis 12/24/2007  . Sinusitis, chronic 12/24/2007   Recurrent acute maxillary and frontal sinusitis    . 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 CARPAL TUNNEL RELEASE Left   . extensive sinus surgery with ablation of the frontal sinuses    . HERNIA REPAIR    . MOUTH SURGERY  11/2019  . nasal polypectomies    . TOOTH EXTRACTION      There were no vitals filed for this visit.   Subjective Assessment - 03/09/21 1745    Subjective  My L hand got worse gradually the lsat 2 months -  feels like weak and incoordinated - and when I stretch my hand open - it hurts over hte back of my hand and knuckles    Pertinent History Pt with long history of OA and RA in multiple joints - and pt report increase weakness and coordination in L hand- refer by Rheumathalogy to OT    Patient Stated Goals Want to be able to use my L hand better and with less pain    Currently in Pain? 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, work out in gym, gardening , clean house      AROM   Right Wrist Extension 65 Degrees    Right Wrist Flexion 60 Degrees    Left Wrist Extension 65 Degrees    Left Wrist Flexion 60 Degrees  Strength   Right Hand Grip (lbs) 72    Right Hand Lateral Pinch 15 lbs    Right Hand 3 Point Pinch 15 lbs    Left Hand Grip (lbs) 82    Left Hand Lateral Pinch 13 lbs    Left Hand 3 Point Pinch 15 lbs      Left Hand AROM   L Thumb MCP 0-60 55 Degrees    L Thumb IP 0-80 60 Degrees    L Index  MCP 0-90 90 Degrees   -50   L Index PIP 0-100 100 Degrees    L Long  MCP 0-90 90 Degrees   -30   L Long PIP 0-100 100 Degrees    L Ring  MCP 0-90 90 Degrees   -40   L Ring PIP 0-100 100 Degrees    L Little  MCP 0-90 90 Degrees   -50   L Little PIP 0-100 90 Degrees                  paraffin done to pt decrease stiffness and decrease pain  Rolling over foam roller - palm and digits into extention  Gentle table slides on towel for composite extention - 20 reps  Pain free Ed on joint protection - use larger  joints, enlarge grips And avoid ulnar deviation of digits and lat grip ( 90 degrees grip with MC's )          OT Education - 03/09/21 1759    Education Details findings of eval and HEP    Person(s) Educated Patient    Methods Explanation;Tactile cues;Verbal cues;Handout    Comprehension Verbal cues required;Returned demonstration;Verbalized understanding               OT Long Term Goals - 03/09/21 1803      OT LONG TERM GOAL #1   Title Pt to be independent in HEP to increase AROM  and decrease pain with extention of digits    Baseline pain extention stretch of digits- extention of MC's -30 to -50    Time 4    Period Weeks    Status New    Target Date 04/06/21      OT LONG TERM GOAL #2   Title Pt to verbalize 3 joint protection and AE to increase ease of ADL's and IADL's and decrease pressure or stress on joints    Baseline no iknowledge    Time 3    Period Weeks    Status New    Target Date 03/30/21      OT LONG TERM GOAL #3   Title Assess need for splints for extention of digits    Time 3    Period Weeks    Status New    Target Date 03/30/21                 Plan - 03/09/21 1800    Clinical Impression Statement Pt refer to OT for RA and OA in multiple joints - pt refer to OT with increase symptoms in L hand - pt show decrease extentoin of digits moslty out of MC's - flexion , grip and prehension strength WNL - pt did had CTR in past with scar , beginning changes of dupuytrens, and ulnar deviation out of MC - at risk for subluxation - pt can beneift from OT services to decrease pain , stiffness and increase AROM - education of joint protection and setting up HEP to increase extention and maintain  his flexion and strength    OT Occupational Profile and History Problem Focused Assessment - Including review of records relating to presenting problem    Occupational performance deficits (Please refer to evaluation for details): Leisure;IADL's;ADL's;Play    Body  Structure / Function / Physical Skills ADL;FMC;Coordination;Flexibility;Dexterity;Pain;UE functional use;IADL;ROM    Rehab Potential Good    Comorbidities Affecting Occupational Performance: None    OT Frequency 1x / week    OT Duration 4 weeks    OT Treatment/Interventions Self-care/ADL training;DME and/or AE instruction;Splinting;Therapeutic exercise;Manual Therapy;Passive range of motion    Consulted and Agree with Plan of Care Patient           Patient will benefit from skilled therapeutic intervention in order to improve the following deficits and impairments:   Body Structure / Function / Physical Skills: ADL,FMC,Coordination,Flexibility,Dexterity,Pain,UE functional use,IADL,ROM       Visit Diagnosis: Stiffness of left hand, not elsewhere classified - Plan: Ot plan of care cert/re-cert  Pain in left hand - Plan: Ot plan of care cert/re-cert    Problem List Patient Active Problem List   Diagnosis Date Noted  . Mild neurocognitive disorder 09/14/2020  . Generalized anxiety disorder   . Coarse tremors 12/29/2019  . Diplopia 12/29/2019  . Esotropia, intermittent 12/29/2019  . Regular astigmatism of both eyes 12/29/2019  . High risk medication use 05/29/2018  . History of IBS 05/29/2018  . Major depressive disorder in remission 07/15/2017  . 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 Asthmatic bronchitis with acute exacerbation 10/15/2016  . Herpes zoster 08/01/2013  . Rheumatoid arthritis of multiple sites without rheumatoid factor ([place]) 09/18/2010  . Polyp of nasal cavity 12/24/2007  . Sinusitis, chronic 12/24/2007  . Seasonal and perennial allergic rhinitis 12/24/2007  . Allergic-infective asthma 12/24/2007  . GERD (gastroesophageal reflux disease) 12/24/2007    Rosalyn Gess OTR/l,CLT 03/09/2021, 6:07 PM  [facility] [address] [place], [address] Phone: [phone]    Fax:  [phone]  Name: [name] [name] MRN: [id number] Date of Birth: [birth year]

## 2021-03-10 ENCOUNTER — Ambulatory Visit (INDEPENDENT_AMBULATORY_CARE_PROVIDER_SITE_OTHER): Payer: Medicare Other | Admitting: Adult Health

## 2021-03-10 ENCOUNTER — Encounter: Payer: Self-pay | Admitting: Adult Health

## 2021-03-10 DIAGNOSIS — G4700 Insomnia, unspecified: Secondary | ICD-10-CM | POA: Diagnosis not present

## 2021-03-10 DIAGNOSIS — F39 Unspecified mood [affective] disorder: Secondary | ICD-10-CM

## 2021-03-10 DIAGNOSIS — F411 Generalized anxiety disorder: Secondary | ICD-10-CM | POA: Diagnosis not present

## 2021-03-10 DIAGNOSIS — F331 Major depressive disorder, recurrent, moderate: Secondary | ICD-10-CM | POA: Diagnosis not present

## 2021-03-10 MED ORDER — ARIPIPRAZOLE 5 MG PO TABS: ORAL_TABLET | ORAL | 3 refills | Status: DC

## 2021-03-10 NOTE — Progress Notes (Signed)
[name] [name] [id number] Jul 18, 1951 70 y.o.  Subjective:   Patient ID:  [name] [name] is a 70 y.o. (DOB [birth year]) male.  Chief Complaint: No chief complaint on file.   HPI [name] [name] presents to the office today for follow-up of anxiety, depression, mood disorder, and Insomnia.  Describes mood today as "ok". Pleasant. Mood symptoms - reports decreased depression, anxiety - "a little still", or irritability. Has reduced Abilify to 7.5mg  and doing well - did also try decreasing to 5mg  which was "not good". Stating "I feel like I'm doing better". Tremors have improved with addition of Propranolol. Stable interest and motivation. Taking medications as prescribed.  Energy levels stable. Active, has a regular exercise routine. Working with P/T. Enjoys some usual interests and activities. Lives alone. Spending time with dog "Minnie". Talking with children. Appetite adequate. Weight loss 6 pounds - 163 from 169 pounds.   Sleeps well most nights. Averages 6 to 8 hours. Focus and concentration stable - "seems to be good". Completing tasks. Managing aspects of household. Retired. Denies SI or HI.  Denies AH or VH.    Flowsheet Row INFUSION 120 from 11/14/2020 in [facility] No Risk       Review of Systems:  Review of Systems  Musculoskeletal: Negative for gait problem.  Neurological: Negative for tremors.  Psychiatric/Behavioral:       Please refer to HPI    Medications: I have reviewed the patient's current medications.  Current Outpatient Medications  Medication Sig Dispense Refill  . Abatacept (ORENCIA IV) Inject into the vein every 30 (thirty) days.    Marland Kitchen acetaminophen (TYLENOL) 325 MG tablet Take 650 mg by mouth every 6 (six) hours as needed.    Marland Kitchen albuterol (PROAIR HFA) 108 (90 Base) MCG/ACT inhaler INHALE 1-2 PUFFS EVERY 6 HOURS AS NEEDED FOR WHEEZING OR SHORTNESS OF BREATH 8.5 g 12  . ARIPiprazole (ABILIFY) 10 MG  tablet Take one tablet daily. 90 tablet 1  . fluticasone (FLONASE) 50 MCG/ACT nasal spray Place 1 spray into both nostrils daily. 48 g 4  . Fluticasone-Umeclidin-Vilant (TRELEGY ELLIPTA) 100-62.5-25 MCG/INH AEPB INHALE ONE PUFF BY MOUTH DAILY 60 each 5  . guaiFENesin (MUCUS RELIEF ADULT PO) Take by mouth daily.    Marland Kitchen loratadine (CLARITIN) 10 MG tablet Take 10 mg by mouth daily.    Marland Kitchen losartan-hydrochlorothiazide (HYZAAR) 100-25 MG tablet Take 1 tablet by mouth daily.     . montelukast (SINGULAIR) 10 MG tablet TAKE ONE TABLET BY MOUTH DAILY 90 tablet 2  . nystatin (MYCOSTATIN) 100000 UNIT/ML suspension Take 5 mLs by mouth daily. prn    . Omeprazole (PRILOSEC PO) Take by mouth 2 (two) times daily.     . pantoprazole (PROTONIX) 20 MG tablet Take 20 mg by mouth 2 (two) times daily.    . Probiotic Product (PROBIOTIC PO) Take 1 tablet by mouth daily.     . propranolol (INDERAL) 20 MG tablet Take 1 tablet (20 mg total) by mouth 2 (two) times daily. 180 tablet 1   No current facility-administered medications for this visit.    Medication Side Effects: None  Allergies:  Allergies  Allergen Reactions  . Simponi [Golimumab]     Repeated infections  . Aspirin     REACTION: throat swelling  . Other Other (See Comments)    REACTION: oral sores Other reaction(s): rash  . Sulfa Antibiotics     Other reaction(s): Unknown  . Sulfonamide Derivatives  REACTION: oral sores  . Theophyllines     Past Medical History:  Diagnosis Date  . Allergic-infective asthma 12/24/2007   FENO- 08/07/16- 25 Office spirometry 08/07/16- WNL, FEV1 3.59/ 110%, R 0.81   . Asthmatic bronchitis with acute exacerbation 10/15/2016  . Coarse tremors 12/29/2019   Upper extremities, bilateral  . Diplopia 12/29/2019  . Generalized anxiety disorder   . GERD (gastroesophageal reflux disease) 12/24/2007  . Major depressive disorder in remission 07/15/2017  . Mild neurocognitive disorder 09/14/2020  . Polyp of nasal cavity  12/24/2007  . Regular astigmatism of both eyes 12/29/2019  . Rheumatoid arthritis of multiple sites without rheumatoid factor 09/18/2010  . Seasonal and perennial allergic rhinitis 12/24/2007  . Sinusitis, chronic 12/24/2007   Recurrent acute maxillary and frontal sinusitis    . 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   Please see review of systems for further details on the patient's review from today.   Objective:   Physical Exam:  There were no vitals taken for this visit.  Physical Exam Constitutional:      General: He is not in acute distress. Musculoskeletal:        General: No deformity.  Neurological:     Mental Status: He is alert and oriented to person, place, and time.     Coordination: Coordination normal.  Psychiatric:        Attention and Perception: Attention and perception normal. He does not perceive auditory or visual hallucinations.        Mood and Affect: Mood normal. Mood is not anxious or depressed. Affect is not labile, blunt, angry or inappropriate.        Speech: Speech normal.        Behavior: Behavior normal.        Thought Content: Thought content normal. Thought content is not paranoid or delusional. Thought content does not include homicidal or suicidal ideation. Thought content does not include homicidal or suicidal plan.        Cognition and Memory: Cognition and memory normal.        Judgment: Judgment normal.     Comments: Insight intact     Lab Review:     Component Value Date/Time   NA 137 01/10/2021 1052   K 4.4 01/10/2021 1052   CL 106 01/10/2021 1052   CO2 26 01/10/2021 1052   GLUCOSE 108 (H) 01/10/2021 1052   BUN 16 01/10/2021 1052   CREATININE 1.00 01/10/2021 1052   CREATININE 1.10 12/07/2019 0859   CALCIUM 9.4 01/10/2021 1052   PROT 7.1 01/10/2021 1052   ALBUMIN 4.0 01/10/2021 1052   AST 36  01/10/2021 1052   ALT 26 01/10/2021 1052   ALKPHOS 49 01/10/2021 1052   BILITOT 0.6 01/10/2021 1052   GFRNONAA >60 01/10/2021 1052   GFRNONAA 69 12/07/2019 0859   GFRAA >60 05/23/2020 0908   GFRAA 80 12/07/2019 0859       Component Value Date/Time   WBC 5.0 01/10/2021 1052   RBC 4.43 01/10/2021 1052   HGB 13.8 01/10/2021 1052   HCT 41.7 01/10/2021 1052   PLT 281 01/10/2021 1052   MCV 94.1 01/10/2021 1052   MCH 31.2 01/10/2021 1052   MCHC 33.1 01/10/2021 1052   RDW 13.1 01/10/2021 1052   LYMPHSABS 1.6 01/10/2021 1052   MONOABS 0.5 01/10/2021 1052   EOSABS 0.1 01/10/2021 1052   BASOSABS 0.0 01/10/2021 1052  Lithium Lvl  Date Value Ref Range Status  05/23/2017 0.7 0.6 - 1.2 mmol/L Final    Comment:    ** Please note change in unit of measure and reference range(s). **     No results found for: PHENYTOIN, PHENOBARB, VALPROATE, CBMZ   .res Assessment: Plan:     Plan:  Propranolol 10mg  to 20mg  BID for tremors - Dr Tat increased at recent visit - no visible tremor at today's visit.  Abilify 7.5mg  tablet daily   Consider Risperdal to replace Abilify  Recently seen by Dr Carles Collet - neurology   RTC 3 months  Patient advised to contact office with any questions, adverse effects, or acute worsening in signs and symptoms.  Discussed potential benefits, risk, and side effects of benzodiazepines to include potential risk of tolerance and dependence, as well as possible drowsiness. Advised patient not to drive if experiencing drowsiness and to take lowest possible effective dose to minimize risk of dependence and tolerance.  Discussed potential metabolic side effects associated with atypical antipsychotics, as well as potential risk for movement side effects. Advised pt to contact office if movement side effects occur.    There are no diagnoses linked to this encounter.   Please see After Visit Summary for patient specific instructions.  Future Appointments  Date Time  Provider [place]  03/13/2021  3:15 PM Rosalyn Gess, OT [facility] None  03/13/2021  3:45 PM Joneen Boers R, PT [facility] None  03/15/2021 11:45 AM Joneen Boers R, PT [facility] None  03/20/2021  1:30 PM Joneen Boers R, PT [facility] None  03/22/2021 10:15 AM Joneen Boers R, PT [facility] None  03/27/2021  4:30 PM Madaline Savage, PT [facility] None  03/29/2021  1:30 PM Joneen Boers R, PT [facility] None  04/03/2021  2:15 PM Joneen Boers R, PT [facility] None  04/05/2021 11:45 AM Joneen Boers R, PT [facility] None  04/06/2021 10:00 AM [facility] None  04/11/2021 11:00 AM Laygo, Kittie Plater R, PT [facility] None  04/13/2021 11:00 AM Joneen Boers R, PT [facility] None  04/18/2021  9:30 AM Joneen Boers R, PT [facility] None  04/20/2021  2:15 PM Madaline Savage, PT [facility] None  05/04/2021 10:30 AM Baird Lyons D, MD [facility] None  05/09/2021 10:15 AM Bo Merino, MD [facility] None  09/07/2021  3:30 PM Tat, Eustace Quail, DO [facility] None  12/11/2021  8:30 AM Hazle Coca, PhD [facility] None  12/11/2021  9:30 AM [facility] NEUROPSYCH TECH [facility] None  12/18/2021 10:00 AM Hazle Coca, PhD [facility] None    No orders of the defined types were placed in this encounter.   -------------------------------

## 2021-03-13 ENCOUNTER — Ambulatory Visit: Payer: Medicare Other

## 2021-03-13 ENCOUNTER — Ambulatory Visit: Payer: Medicare Other | Admitting: Occupational Therapy

## 2021-03-13 ENCOUNTER — Other Ambulatory Visit: Payer: Self-pay

## 2021-03-13 DIAGNOSIS — M79672 Pain in left foot: Secondary | ICD-10-CM

## 2021-03-13 DIAGNOSIS — M25642 Stiffness of left hand, not elsewhere classified: Secondary | ICD-10-CM | POA: Diagnosis not present

## 2021-03-13 DIAGNOSIS — M79671 Pain in right foot: Secondary | ICD-10-CM | POA: Diagnosis not present

## 2021-03-13 DIAGNOSIS — M79642 Pain in left hand: Secondary | ICD-10-CM

## 2021-03-13 NOTE — Therapy (Signed)
[facility] [address], [place], [address] Phone: [phone]   Fax:  [phone]  Physical Therapy Treatment  Patient Details  Name: [name] [name] MRN: [id number] Date of Birth: 05-26-1951 Referring Provider (PT): Bo Merino, MD   Encounter Date: 03/13/2021   PT End of Session - 03/13/21 1619    Visit Number 4    Number of Visits 17    Date for PT Re-Evaluation 04/27/21    Authorization Type Traditional Medicare    Authorization Time Period 02/27/21-04/27/21    PT Start Time 1545    PT Stop Time 1625    PT Time Calculation (min) 40 min    Activity Tolerance Patient tolerated treatment well    Behavior During Therapy [facility] for tasks assessed/performed           Past Medical History:  Diagnosis Date  . Allergic-infective asthma 12/24/2007   FENO- 08/07/16- 25 Office spirometry 08/07/16- WNL, FEV1 3.59/ 110%, R 0.81   . Asthmatic bronchitis with acute exacerbation 10/15/2016  . Coarse tremors 12/29/2019   Upper extremities, bilateral  . Diplopia 12/29/2019  . Generalized anxiety disorder   . GERD (gastroesophageal reflux disease) 12/24/2007  . Major depressive disorder in remission 07/15/2017  . Mild neurocognitive disorder 09/14/2020  . Polyp of nasal cavity 12/24/2007  . Regular astigmatism of both eyes 12/29/2019  . Rheumatoid arthritis of multiple sites without rheumatoid factor 09/18/2010  . Seasonal and perennial allergic rhinitis 12/24/2007  . Sinusitis, chronic 12/24/2007   Recurrent acute maxillary and frontal sinusitis    . 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 CARPAL TUNNEL RELEASE Left   . extensive sinus surgery with ablation of the frontal sinuses    . HERNIA REPAIR    . MOUTH SURGERY  11/2019  . nasal polypectomies    . TOOTH EXTRACTION      There were no vitals filed for this visit.    Subjective Assessment - 03/13/21 1546    Subjective L foot is doing pain. Not really haining pain currently. 3/10 L foot at most for the past 7 days. Its getting stretched out.    Pertinent History RA, B hand and foot pain. Pt having tremmors. Difficulty coordinating his L hand due to tremmors and arthritis. Has B hand arthritis at all his knuckles/mcp joints. B foot pain L > R. Body feels like it is trying to make his feet go into B PF. Foot pain located at the MTP joints bilaterally L > R. Pt started having tremmors when taking abylify. B feet started bothering him when pt started having tremmors which was about 2 months ago around March 2022.    Currently in Pain? No/denies                                     PT Education - 03/13/21 1633    Education Details ther-ex    Person(s) Educated Patient    Methods Explanation;Tactile cues;Demonstration;Verbal cues    Comprehension Returned demonstration;Verbalized understanding          Objective    No latex allergies Blood pressure is controlled  Pain location: toe extensor tendons as well asPIP (proximal interphalangeal)joints.  Pt states having hammer toes for about 2 years  MedbridgeAccess Code [email or web address]   Manual therapy Seated toe extension stretch 2-4th digits  with gentle joint distractions  Seated STM L calf targeting the toe flexor muscles Seated STM plantar foot to decrease muscle tensionand fascial restritions  improved toe extension ROM observed L foot.     Therapeutic exercise Seated toe extension stretch all toes pressing down on towel 10x3 with 5 second holds   Heel walking to promote gastroc stretch 32 ft x   Gait with toe extension from foot flat to push off phase 100 ft  Standing toe extension stretch L at wall 30 seconds x 3  Standing gastroc stretch at stair step with B UE assist 30 seconds x 3    Improved exercise technique, movement at target joints, use  of target muscles after mod verbal, visual, tactile cues.    Response to treatment Pt states toes look and feel much better after treatment.     Clinical impression Improving overall L toe 2-4 extension observed and palpated compared to previous sessions. Continued working on decreasing soft tissue restrictions, plantar foot as well as decreasing gastroc tightness. Pt tolerated session well without aggravation of symptoms. Pt will benefit from continued skilled physical therapy services to decrease pain, improve strength and function.       PT Short Term Goals - 02/27/21 1839      PT SHORT TERM GOAL #1   Title Pt will be independent with his initial HEP to improve toe extension, decrease pain, improve ability to ambulate more comfortably.    Time 3    Period Weeks    Status New    Target Date 03/23/21             PT Long Term Goals - 02/27/21 1840      PT LONG TERM GOAL #1   Title Pt will have a decrease in L and R foot/toe(s) pain to 2/10 or less at most to promote ability to ambulate longer distances, negotiate inclines and stairs more comfortably.    Baseline 5/10 R and L foot/toe(s) pain at most for the past month (02/27/2021)    Time 8    Period Weeks    Status New    Target Date 04/27/21      PT LONG TERM GOAL #2   Title Pt will report being able to ambulate at the treadmill for 30 minutes with less pain in B feet to promote fitness.    Baseline Increased pain with gait at treadmill for 20-30 minutes (02/27/2021)    Time 8    Period Weeks    Status New    Target Date 04/27/21      PT LONG TERM GOAL #3   Title Pt will improve his FOTO score by at least 10 points as a demonstration of improved function.    Baseline Lower leg FOTO 58 (02/27/2021)    Time 8    Period Weeks    Status New    Target Date 04/27/21                 Plan - 03/13/21 1631    Clinical Impression Statement Improving overall L toe 2-4 extension observed and palpated compared  to previous sessions. Continued working on decreasing soft tissue restrictions, plantar foot as well as decreasing gastroc tightness. Pt tolerated session well without aggravation of symptoms. Pt will benefit from continued skilled physical therapy services to decrease pain, improve strength and function.    Personal Factors and Comorbidities Age;Comorbidity 3+;Past/Current Experience;Time since onset of injury/illness/exacerbation    Comorbidities Major depressive disorder, RA, mild  neurocognitive disorder    Examination-Activity Limitations Locomotion Level;Stairs    Stability/Clinical Decision Making Stable/Uncomplicated    Rehab Potential Fair    PT Frequency 2x / week    PT Duration 8 weeks    PT Treatment/Interventions Manual techniques;Therapeutic exercise;Therapeutic activities;Electrical Stimulation;Iontophoresis 4mg /ml Dexamethasone;Neuromuscular re-education;Patient/family education    PT Next Visit Plan toe extension, manual technies, strengthening, modalties PRN    Consulted and Agree with Plan of Care Patient           Patient will benefit from skilled therapeutic intervention in order to improve the following deficits and impairments:  Pain,Improper body mechanics,Difficulty walking,Decreased range of motion  Visit Diagnosis: Pain in left foot     Problem List Patient Active Problem List   Diagnosis Date Noted  . Mild neurocognitive disorder 09/14/2020  . Generalized anxiety disorder   . Coarse tremors 12/29/2019  . Diplopia 12/29/2019  . Esotropia, intermittent 12/29/2019  . Regular astigmatism of both eyes 12/29/2019  . High risk medication use 05/29/2018  . History of IBS 05/29/2018  . Major depressive disorder in remission 07/15/2017  . 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 Asthmatic bronchitis with acute exacerbation 10/15/2016  . Herpes zoster 08/01/2013  . Rheumatoid arthritis of multiple sites without rheumatoid factor ([place]) 09/18/2010   . Polyp of nasal cavity 12/24/2007  . Sinusitis, chronic 12/24/2007  . Seasonal and perennial allergic rhinitis 12/24/2007  . Allergic-infective asthma 12/24/2007  . GERD (gastroesophageal reflux disease) 12/24/2007    Joneen Boers PT, DPT   03/13/2021, 4:34 PM  [facility] [address] [place], [address] Phone: [phone]   Fax:  [phone]  Name: [name] [name] MRN: [id number] Date of Birth: 07-22-1951

## 2021-03-13 NOTE — Therapy (Signed)
[facility] [address] [place], [address] Phone: [phone]   Fax:  [phone]  Occupational Therapy Treatment  Patient Details  Name: [name] [name] MRN: [id number] Date of Birth: December 01, 1950 Referring Provider (OT): Deveshwar   Encounter Date: 03/13/2021   OT End of Session - 03/13/21 1549    Visit Number 2    Number of Visits 4    Date for OT Re-Evaluation 04/06/21    OT Start Time 1515    OT Stop Time 1545    OT Time Calculation (min) 30 min    Activity Tolerance Patient tolerated treatment well    Behavior During Therapy [facility] for tasks assessed/performed           Past Medical History:  Diagnosis Date  . Allergic-infective asthma 12/24/2007   FENO- 08/07/16- 25 Office spirometry 08/07/16- WNL, FEV1 3.59/ 110%, R 0.81   . Asthmatic bronchitis with acute exacerbation 10/15/2016  . Coarse tremors 12/29/2019   Upper extremities, bilateral  . Diplopia 12/29/2019  . Generalized anxiety disorder   . GERD (gastroesophageal reflux disease) 12/24/2007  . Major depressive disorder in remission 07/15/2017  . Mild neurocognitive disorder 09/14/2020  . Polyp of nasal cavity 12/24/2007  . Regular astigmatism of both eyes 12/29/2019  . Rheumatoid arthritis of multiple sites without rheumatoid factor 09/18/2010  . Seasonal and perennial allergic rhinitis 12/24/2007  . Sinusitis, chronic 12/24/2007   Recurrent acute maxillary and frontal sinusitis    . 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 CARPAL TUNNEL RELEASE Left   . extensive sinus surgery with ablation of the frontal sinuses    . HERNIA REPAIR    . MOUTH SURGERY  11/2019  . nasal polypectomies    . TOOTH EXTRACTION      There were no vitals filed for this visit.   Subjective Assessment - 03/13/21 1525    Subjective  Did the exercises and can tell diffence that it is  not as tight - little more motion - and try to push up thru my palm -I do like rolling the roller for my fingers    Pertinent History Pt with long history of OA and RA in multiple joints - and pt report increase weakness and coordination in L hand- refer by Rheumathalogy to OT    Patient Stated Goals Want to be able to use my L hand better and with less pain              [facility] OT Assessment - 03/13/21 0001      Left Hand AROM   L Index  MCP 0-90 --   -45   L Long  MCP 0-90 --   -30   L Ring  MCP 0-90 --   -35   L Little  MCP 0-90 --   -45                   OT Treatments/Exercises (OP) - 03/13/21 0001      LUE Paraffin   Number Minutes Paraffin 8 Minutes    LUE Paraffin Location Hand;Wrist    Comments prior to soft tissue and increase extenion of digits and wrist             paraffin done to pt decrease stiffness and decrease pain  Soft tissue mobs by OT for [facility] and CT spreads- and graston tool nr 2 on volar forearm ,  palm and digits - with PROM and prolonged stretch for extention by OT  Rolling over foam roller - palm and digits into extention and then relax into hyper extention Gentle table slides on towel for composite extention - 20 reps  Pain free Ed on joint protection - use larger joints, enlarge grips And avoid ulnar deviation of digits and lat grip ( 90 degrees grip with MC's )       OT Education - 03/13/21 1549    Education Details progress and HEP    Person(s) Educated Patient    Methods Explanation;Tactile cues;Verbal cues;Handout    Comprehension Verbal cues required;Returned demonstration;Verbalized understanding               OT Long Term Goals - 03/09/21 1803      OT LONG TERM GOAL #1   Title Pt to be independent in HEP to increase AROM  and decrease pain with extention of digits    Baseline pain extention stretch of digits- extention of MC's -30 to -50    Time 4    Period Weeks    Status New    Target Date 04/06/21      OT  LONG TERM GOAL #2   Title Pt to verbalize 3 joint protection and AE to increase ease of ADL's and IADL's and decrease pressure or stress on joints    Baseline no iknowledge    Time 3    Period Weeks    Status New    Target Date 03/30/21      OT LONG TERM GOAL #3   Title Assess need for splints for extention of digits    Time 3    Period Weeks    Status New    Target Date 03/30/21                 Plan - 03/13/21 1549    Clinical Impression Statement Pt refer to OT for RA and OA in multiple joints - pt refer to OT with increase symptoms in L hand - pt show decrease extentoin of digits moslty out of MC'sat eval - flexion , grip and prehension strength WNL - pt did had CTR in past with scar , beginning changes of dupuytrens, and ulnar deviation out of MC - at risk for subluxation - pt since seen last week show about 5 degrees extention at some of the MC's - and not as tight in palm -  pt can beneift from OT services to decrease pain , stiffness and increase AROM - education of joint protection and setting up HEP to increase extention and maintain his flexion and strength    OT Occupational Profile and History Problem Focused Assessment - Including review of records relating to presenting problem    Occupational performance deficits (Please refer to evaluation for details): Leisure;IADL's;ADL's;Play    Body Structure / Function / Physical Skills ADL;FMC;Coordination;Flexibility;Dexterity;Pain;UE functional use;IADL;ROM    Rehab Potential Good    Clinical Decision Making Limited treatment options, no task modification necessary    Comorbidities Affecting Occupational Performance: None    Modification or Assistance to Complete Evaluation  No modification of tasks or assist necessary to complete eval    OT Frequency 1x / week    OT Duration 4 weeks    OT Treatment/Interventions Self-care/ADL training;DME and/or AE instruction;Splinting;Therapeutic exercise;Manual Therapy;Passive range of  motion    Consulted and Agree with Plan of Care Patient           Patient will  benefit from skilled therapeutic intervention in order to improve the following deficits and impairments:   Body Structure / Function / Physical Skills: ADL,FMC,Coordination,Flexibility,Dexterity,Pain,UE functional use,IADL,ROM       Visit Diagnosis: Stiffness of left hand, not elsewhere classified  Pain in left hand    Problem List Patient Active Problem List   Diagnosis Date Noted  . Mild neurocognitive disorder 09/14/2020  . Generalized anxiety disorder   . Coarse tremors 12/29/2019  . Diplopia 12/29/2019  . Esotropia, intermittent 12/29/2019  . Regular astigmatism of both eyes 12/29/2019  . High risk medication use 05/29/2018  . History of IBS 05/29/2018  . Major depressive disorder in remission 07/15/2017  . 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 Asthmatic bronchitis with acute exacerbation 10/15/2016  . Herpes zoster 08/01/2013  . Rheumatoid arthritis of multiple sites without rheumatoid factor ([place]) 09/18/2010  . Polyp of nasal cavity 12/24/2007  . Sinusitis, chronic 12/24/2007  . Seasonal and perennial allergic rhinitis 12/24/2007  . Allergic-infective asthma 12/24/2007  . GERD (gastroesophageal reflux disease) 12/24/2007    Rosalyn Gess OTR/L,CLT 03/13/2021, 3:52 PM  [place] [facility] [address] [place], [address] Phone: [phone]   Fax:  [phone]  Name: ARSHAD OBERHOLZER MRN: [id number] Date of Birth: 04/28/1951

## 2021-03-15 ENCOUNTER — Other Ambulatory Visit: Payer: Self-pay

## 2021-03-15 ENCOUNTER — Ambulatory Visit: Payer: Medicare Other

## 2021-03-15 DIAGNOSIS — M25642 Stiffness of left hand, not elsewhere classified: Secondary | ICD-10-CM | POA: Diagnosis not present

## 2021-03-15 DIAGNOSIS — M79642 Pain in left hand: Secondary | ICD-10-CM | POA: Diagnosis not present

## 2021-03-15 DIAGNOSIS — M79672 Pain in left foot: Secondary | ICD-10-CM

## 2021-03-15 DIAGNOSIS — M79671 Pain in right foot: Secondary | ICD-10-CM | POA: Diagnosis not present

## 2021-03-15 NOTE — Therapy (Signed)
[place] [facility] [address] [place], [address] Phone: [phone]   Fax:  [phone]  Physical Therapy Treatment  Patient Details  Name: [name] [name] MRN: [id number] Date of Birth: 24-Aug-1951 Referring Provider (PT): Bo Merino, MD   Encounter Date: 03/15/2021   PT End of Session - 03/15/21 1139    Visit Number 5    Number of Visits 17    Date for PT Re-Evaluation 04/27/21    Authorization Type Traditional Medicare    Authorization Time Period 02/27/21-04/27/21    PT Start Time 1139    PT Stop Time 1219    PT Time Calculation (min) 40 min    Activity Tolerance Patient tolerated treatment well    Behavior During Therapy [facility] for tasks assessed/performed           Past Medical History:  Diagnosis Date  . Allergic-infective asthma 12/24/2007   FENO- 08/07/16- 25 Office spirometry 08/07/16- WNL, FEV1 3.59/ 110%, R 0.81   . Asthmatic bronchitis with acute exacerbation 10/15/2016  . Coarse tremors 12/29/2019   Upper extremities, bilateral  . Diplopia 12/29/2019  . Generalized anxiety disorder   . GERD (gastroesophageal reflux disease) 12/24/2007  . Major depressive disorder in remission 07/15/2017  . Mild neurocognitive disorder 09/14/2020  . Polyp of nasal cavity 12/24/2007  . Regular astigmatism of both eyes 12/29/2019  . Rheumatoid arthritis of multiple sites without rheumatoid factor 09/18/2010  . Seasonal and perennial allergic rhinitis 12/24/2007  . Sinusitis, chronic 12/24/2007   Recurrent acute maxillary and frontal sinusitis    . 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 CARPAL TUNNEL RELEASE Left   . extensive sinus surgery with ablation of the frontal sinuses    . HERNIA REPAIR    . MOUTH SURGERY  11/2019  . nasal polypectomies    . TOOTH EXTRACTION      There were no vitals filed for this visit.    Subjective Assessment - 03/15/21 1140    Subjective Feet are about the same, maybe a little better. 0/10 R foot 2/10 L foot pain currently when walking. Not bad, just notices it.    Pertinent History RA, B hand and foot pain. Pt having tremmors. Difficulty coordinating his L hand due to tremmors and arthritis. Has B hand arthritis at all his knuckles/mcp joints. B foot pain L > R. Body feels like it is trying to make his feet go into B PF. Foot pain located at the MTP joints bilaterally L > R. Pt started having tremmors when taking abylify. B feet started bothering him when pt started having tremmors which was about 2 months ago around March 2022.    Currently in Pain? Yes    Pain Score 2                                      PT Education - 03/15/21 1208    Education Details ther-ex    Person(s) Educated Patient    Methods Explanation;Demonstration;Tactile cues;Verbal cues    Comprehension Returned demonstration;Verbalized understanding          Objective    No latex allergies Blood pressure is controlled  Pain location: toe extensor tendons as well asPIP (proximal interphalangeal)joints.  Pt states having hammer toes for about 2 years  MedbridgeAccess Code [email or web address]   Manual  therapy Seated toe extension stretch 2-4th digits with gentle joint distractions  Seated STM L calf targeting the toe flexor muscles Seated STM plantar foot to decrease muscle tensionand fascial restritions  improved toe extension ROM observed L foot.     Therapeutic exercise Seated toe extension stretch all toes pressing down on towel 10x3 with 5 second holds   Standing gastroc stretch at stair step with B UE assist 30 seconds x 3  Heel walking to promote gastroc stretch 32 ft x 2  Gait with toe extension from foot flat to push off phase 100 ft   Standing toe extension stretch L at wall 30 seconds x 3   Improved exercise technique, movement at target  joints, use of target muscles after mod verbal, visual, tactile cues.   Response to treatment Pt tolerated session well without aggravation of symptoms.     Clinical impression Pt continues to improve toe extension L toes digits 2-4 after manual techniques to decrease fascial restrictions and muscle tension along the toe flexor muscles. Pt tolerated session well without aggravation of symptoms. Pt will benefit from continued skilled physical therapy services to decrease pain, improve strength and function.       PT Short Term Goals - 02/27/21 1839      PT SHORT TERM GOAL #1   Title Pt will be independent with his initial HEP to improve toe extension, decrease pain, improve ability to ambulate more comfortably.    Time 3    Period Weeks    Status New    Target Date 03/23/21             PT Long Term Goals - 02/27/21 1840      PT LONG TERM GOAL #1   Title Pt will have a decrease in L and R foot/toe(s) pain to 2/10 or less at most to promote ability to ambulate longer distances, negotiate inclines and stairs more comfortably.    Baseline 5/10 R and L foot/toe(s) pain at most for the past month (02/27/2021)    Time 8    Period Weeks    Status New    Target Date 04/27/21      PT LONG TERM GOAL #2   Title Pt will report being able to ambulate at the treadmill for 30 minutes with less pain in B feet to promote fitness.    Baseline Increased pain with gait at treadmill for 20-30 minutes (02/27/2021)    Time 8    Period Weeks    Status New    Target Date 04/27/21      PT LONG TERM GOAL #3   Title Pt will improve his FOTO score by at least 10 points as a demonstration of improved function.    Baseline Lower leg FOTO 58 (02/27/2021)    Time 8    Period Weeks    Status New    Target Date 04/27/21                 Plan - 03/15/21 1212    Clinical Impression Statement Pt continues to improve toe extension L toes digits 2-4 after manual techniques to decrease  fascial restrictions and muscle tension along the toe flexor muscles. Pt tolerated session well without aggravation of symptoms. Pt will benefit from continued skilled physical therapy services to decrease pain, improve strength and function.    Personal Factors and Comorbidities Age;Comorbidity 3+;Past/Current Experience;Time since onset of injury/illness/exacerbation    Comorbidities Major depressive disorder, RA, mild neurocognitive disorder  Examination-Activity Limitations Locomotion Level;Stairs    Stability/Clinical Decision Making Stable/Uncomplicated    Clinical Decision Making Low    Rehab Potential Fair    PT Frequency 2x / week    PT Duration 8 weeks    PT Treatment/Interventions Manual techniques;Therapeutic exercise;Therapeutic activities;Electrical Stimulation;Iontophoresis 4mg /ml Dexamethasone;Neuromuscular re-education;Patient/family education    PT Next Visit Plan toe extension, manual technies, strengthening, modalties PRN    Consulted and Agree with Plan of Care Patient           Patient will benefit from skilled therapeutic intervention in order to improve the following deficits and impairments:  Pain,Improper body mechanics,Difficulty walking,Decreased range of motion  Visit Diagnosis: Stiffness of left hand, not elsewhere classified  Pain in left foot     Problem List Patient Active Problem List   Diagnosis Date Noted  . Mild neurocognitive disorder 09/14/2020  . Generalized anxiety disorder   . Coarse tremors 12/29/2019  . Diplopia 12/29/2019  . Esotropia, intermittent 12/29/2019  . Regular astigmatism of both eyes 12/29/2019  . High risk medication use 05/29/2018  . History of IBS 05/29/2018  . Major depressive disorder in remission 07/15/2017  . 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 Asthmatic bronchitis with acute exacerbation 10/15/2016  . Herpes zoster 08/01/2013  . Rheumatoid arthritis of multiple sites without rheumatoid  factor ([place]) 09/18/2010  . Polyp of nasal cavity 12/24/2007  . Sinusitis, chronic 12/24/2007  . Seasonal and perennial allergic rhinitis 12/24/2007  . Allergic-infective asthma 12/24/2007  . GERD (gastroesophageal reflux disease) 12/24/2007    Joneen Boers PT, DPT   03/15/2021, 12:34 PM  [facility] [facility] [address] [place], [address] Phone: [phone]   Fax:  [phone]  Name: PHUC KLUTTZ MRN: [id number] Date of Birth: [birth year]

## 2021-03-20 ENCOUNTER — Other Ambulatory Visit: Payer: Self-pay

## 2021-03-20 ENCOUNTER — Ambulatory Visit: Payer: Medicare Other

## 2021-03-20 DIAGNOSIS — M79642 Pain in left hand: Secondary | ICD-10-CM | POA: Diagnosis not present

## 2021-03-20 DIAGNOSIS — M79672 Pain in left foot: Secondary | ICD-10-CM

## 2021-03-20 DIAGNOSIS — M79671 Pain in right foot: Secondary | ICD-10-CM | POA: Diagnosis not present

## 2021-03-20 DIAGNOSIS — M25642 Stiffness of left hand, not elsewhere classified: Secondary | ICD-10-CM | POA: Diagnosis not present

## 2021-03-20 NOTE — Therapy (Signed)
[place] [facility] [address], [place], [address] Phone: [phone]   Fax:  [phone]  Physical Therapy Treatment  Patient Details  Name: [name] [name] MRN: [id number] Date of Birth: 26-Nov-1950 Referring Provider (PT): Bo Merino, MD   Encounter Date: 03/20/2021   PT End of Session - 03/20/21 1331     Visit Number 6    Number of Visits 17    Date for PT Re-Evaluation 04/27/21    Authorization Type Traditional Medicare    Authorization Time Period 02/27/21-04/27/21    PT Start Time 1331    PT Stop Time 1400    PT Time Calculation (min) 29 min    Activity Tolerance Patient tolerated treatment well    Behavior During Therapy [facility] for tasks assessed/performed             Past Medical History:  Diagnosis Date   Allergic-infective asthma 12/24/2007   FENO- 08/07/16- 25 Office spirometry 08/07/16- WNL, FEV1 3.59/ 110%, R 0.81    Asthmatic bronchitis with acute exacerbation 10/15/2016   Coarse tremors 12/29/2019   Upper extremities, bilateral   Diplopia 12/29/2019   Generalized anxiety disorder    GERD (gastroesophageal reflux disease) 12/24/2007   Major depressive disorder in remission 07/15/2017   Mild neurocognitive disorder 09/14/2020   Polyp of nasal cavity 12/24/2007   Regular astigmatism of both eyes 12/29/2019   Rheumatoid arthritis of multiple sites without rheumatoid factor 09/18/2010   Seasonal and perennial allergic rhinitis 12/24/2007   Sinusitis, chronic 12/24/2007   Recurrent acute maxillary and frontal sinusitis     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   Subjective Assessment  - 03/20/21 1333     Subjective Feet are getting better. 0/10 R, 2-3/10 L foot pain at most for the past 7 days. 2-3/10 L foot pain currently when walking.    Pertinent History RA, B hand and foot pain. Pt having tremmors. Difficulty coordinating his L hand due to tremmors and arthritis. Has B hand arthritis at all his knuckles/mcp joints. B foot pain L > R. Body feels like it is trying to make his feet go into B PF. Foot pain located at the MTP joints bilaterally L > R. Pt started having tremmors when taking abylify. B feet started bothering him when pt started having tremmors which was about 2 months ago around March 2022.    Currently in Pain? Yes    Pain Score 3                                        PT Education - 03/20/21 1403     Education Details ther-ex    Person(s) Educated Patient    Methods Explanation;Demonstration;Tactile cues;Verbal cues    Comprehension Returned demonstration;Verbalized understanding           Objective     No latex allergies Blood pressure is controlled   Pain location: toe extensor tendons as well as PIP (proximal interphalangeal) joints. Pt states having hammer toes  for about 2 years   Medbridge Access Code [email or web address]     Manual therapy Seated toe extension stretch 2-4th digits with gentle joint distractions  Seated STM L calf targeting the toe flexor muscles Seated STM plantar foot to decrease muscle tension and fascial restritions   improved toe extension ROM observed L foot  No pain with gait afterwards.Marland Kitchen       Response to treatment Pt tolerated session well without aggravation of symptoms. No L foot pain with gait after session. Improved toe extension palpated and observed after manual therapy.        Clinical impression Pt continues to make very good overall progress with decreased L foot pain based on subjective reports. Manual therapy performed today to continue with toe extension ROM. Pt to continued  with his exercises at home with the stretches. No L foot pain after session. Pt will benefit from continued skilled physical therapy services to decrease pain, improve strength and function.      PT Short Term Goals - 02/27/21 1839       PT SHORT TERM GOAL #1   Title Pt will be independent with his initial HEP to improve toe extension, decrease pain, improve ability to ambulate more comfortably.    Time 3    Period Weeks    Status New    Target Date 03/23/21               PT Long Term Goals - 02/27/21 1840       PT LONG TERM GOAL #1   Title Pt will have a decrease in L and R foot/toe(s) pain to 2/10 or less at most to promote ability to ambulate longer distances, negotiate inclines and stairs more comfortably.    Baseline 5/10 R and L foot/toe(s) pain at most for the past month (02/27/2021)    Time 8    Period Weeks    Status New    Target Date 04/27/21      PT LONG TERM GOAL #2   Title Pt will report being able to ambulate at the treadmill for 30 minutes with less pain in B feet to promote fitness.    Baseline Increased pain with gait at treadmill for 20-30 minutes (02/27/2021)    Time 8    Period Weeks    Status New    Target Date 04/27/21      PT LONG TERM GOAL #3   Title Pt will improve his FOTO score by at least 10 points as a demonstration of improved function.    Baseline Lower leg FOTO 58 (02/27/2021)    Time 8    Period Weeks    Status New    Target Date 04/27/21                   Plan - 03/20/21 1402     Clinical Impression Statement Pt continues to make very good overall progress with decreased L foot pain based on subjective reports. Manual therapy performed today to continue with toe extension ROM. Pt to continued with his exercises at home with the stretches. No L foot pain after session. Pt will benefit from continued skilled physical therapy services to decrease pain, improve strength and function.    Personal Factors and Comorbidities  Age;Comorbidity 3+;Past/Current Experience;Time since onset of injury/illness/exacerbation    Comorbidities Major depressive disorder, RA, mild neurocognitive disorder    Examination-Activity Limitations Locomotion Level;Stairs    Stability/Clinical Decision Making Stable/Uncomplicated    Rehab Potential  Fair    PT Frequency 2x / week    PT Duration 8 weeks    PT Treatment/Interventions Manual techniques;Therapeutic exercise;Therapeutic activities;Electrical Stimulation;Iontophoresis 4mg /ml Dexamethasone;Neuromuscular re-education;Patient/family education    PT Next Visit Plan toe extension, manual technies, strengthening, modalties PRN    Consulted and Agree with Plan of Care Patient             Patient will benefit from skilled therapeutic intervention in order to improve the following deficits and impairments:  Pain, Improper body mechanics, Difficulty walking, Decreased range of motion  Visit Diagnosis: Pain in left foot     Problem List Patient Active Problem List   Diagnosis Date Noted   Mild neurocognitive disorder 09/14/2020   Generalized anxiety disorder    Coarse tremors 12/29/2019   Diplopia 12/29/2019   Esotropia, intermittent 12/29/2019   Regular astigmatism of both eyes 12/29/2019   High risk medication use 05/29/2018   History of IBS 05/29/2018   Major depressive disorder in remission 07/15/2017   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, [place], [address] Phone: [phone]   Fax:  [phone]  Name: TREYLEN GIBBS MRN: [id number] Date of Birth: [birth year]

## 2021-03-21 ENCOUNTER — Ambulatory Visit: Payer: Medicare Other | Admitting: Occupational Therapy

## 2021-03-21 DIAGNOSIS — M79642 Pain in left hand: Secondary | ICD-10-CM | POA: Diagnosis not present

## 2021-03-21 DIAGNOSIS — M25642 Stiffness of left hand, not elsewhere classified: Secondary | ICD-10-CM | POA: Diagnosis not present

## 2021-03-21 DIAGNOSIS — M79671 Pain in right foot: Secondary | ICD-10-CM | POA: Diagnosis not present

## 2021-03-21 DIAGNOSIS — M79672 Pain in left foot: Secondary | ICD-10-CM | POA: Diagnosis not present

## 2021-03-21 NOTE — Therapy (Signed)
[place] [facility] [address], [place], [address] Phone: [phone]   Fax:  [phone]  Occupational Therapy Treatment  Patient Details  Name: [name] [name] MRN: [id number] Date of Birth: 1951-09-28 Referring Provider (OT): Deveshwar   Encounter Date: 03/21/2021   OT End of Session - 03/21/21 1309     Visit Number 3    Number of Visits 4    Date for OT Re-Evaluation 04/06/21    OT Start Time 1230    OT Stop Time 1308    OT Time Calculation (min) 38 min    Activity Tolerance Patient tolerated treatment well    Behavior During Therapy [facility] for tasks assessed/performed             Past Medical History:  Diagnosis Date   Allergic-infective asthma 12/24/2007   FENO- 08/07/16- 25 Office spirometry 08/07/16- WNL, FEV1 3.59/ 110%, R 0.81    Asthmatic bronchitis with acute exacerbation 10/15/2016   Coarse tremors 12/29/2019   Upper extremities, bilateral   Diplopia 12/29/2019   Generalized anxiety disorder    GERD (gastroesophageal reflux disease) 12/24/2007   Major depressive disorder in remission 07/15/2017   Mild neurocognitive disorder 09/14/2020   Polyp of nasal cavity 12/24/2007   Regular astigmatism of both eyes 12/29/2019   Rheumatoid arthritis of multiple sites without rheumatoid factor 09/18/2010   Seasonal and perennial allergic rhinitis 12/24/2007   Sinusitis, chronic 12/24/2007   Recurrent acute maxillary and frontal sinusitis     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   Subjective Assessment - 03/21/21 1235     Subjective  Stiffness is better -using it more and I can push up from the  chair - reach into my pocket easier-    Pertinent History Pt with long history of OA and RA in multiple joints - and pt report increase weakness and coordination in L hand- refer by Rheumathalogy to OT    Patient Stated Goals Want to be able to use my L hand better and with less pain    Currently in Pain? No/denies                [facility] OT Assessment - 03/21/21 0001       AROM   Right Wrist Extension 65 Degrees    Right Wrist Flexion 75 Degrees      Left Hand AROM   L Index  MCP 0-90 --   -45   L Long  MCP 0-90 --   -25   L Ring  MCP 0-90 --   -25   L Little  MCP 0-90 --   -40             Pt report decrease stiffness and pain in L hand - less swelling over MC's and less fibrosis in palm         OT Treatments/Exercises (OP) - 03/21/21 0001       LUE Paraffin   Number Minutes Paraffin 8 Minutes    LUE Paraffin Location Hand;Wrist    Comments prior to soft tissue and review of HEP  paraffin done to pt decrease stiffness and decrease pain  Soft tissue mobs by OT for [place] [place] and CT spreads- and graston tool nr 2 on volar forearm , palm and digits - with PROM and prolonged stretch for extention by OT  Rolling over foam roller - palm and digits into extention and then relax into hyper extention Gentle table slides on towel for composite extention - 20 reps Review and Add to HEP RD of digits- can put finger tips on papers to slide  5 reps - Pain free  Review and reinforce again for pt : Ed on joint protection - use larger joints, enlarge grips And avoid ulnar deviation of digits and lat grip ( 90 degrees grip with MC's ) Pt to cont with HEP for month and will reassess if able to progress /maintain progress          OT Education - 03/21/21 1309     Education Details progress and HEP    Methods Explanation;Tactile cues;Verbal cues;Handout    Comprehension Verbal cues required;Returned demonstration;Verbalized understanding                  OT Long Term Goals - 03/09/21 1803       OT LONG TERM GOAL #1   Title Pt to be independent in HEP to increase AROM  and decrease pain with extention of digits    Baseline pain extention stretch of digits- extention of MC's -30 to -50    Time 4    Period Weeks    Status New    Target Date 04/06/21      OT LONG TERM GOAL #2   Title Pt to verbalize 3 joint protection and AE to increase ease of ADL's and IADL's and decrease pressure or stress on joints    Baseline no iknowledge    Time 3    Period Weeks    Status New    Target Date 03/30/21      OT LONG TERM GOAL #3   Title Assess need for splints for extention of digits    Time 3    Period Weeks    Status New    Target Date 03/30/21                   Plan - 03/21/21 1310     Clinical Impression Statement Pt refer to OT for RA and OA in multiple joints - pt refer to OT with increase symptoms in L hand - pt show decrease extentoin of digits moslty out of MC's at eval - flexion , grip and prehension strength WNL - pt did had CTR in past with scar , beginning changes of dupuytrens, and ulnar deviation out of MC's - at risk for subluxation - pt  showed the last 2 wks 5-15 degrees extention gaines at thee MC's - and not as tight in palm -pt report less pain and stiffness - and able to open hand better-  pt to cont with HEP for month and follow up - di review with pt education of joint protection and setting up HEP to increase extention and maintain his flexion and strength    OT Occupational Profile and History Problem Focused Assessment - Including review of records relating to presenting problem    Occupational performance deficits (Please refer to evaluation for details): Leisure;IADL's;ADL's;Play    Body Structure / Function / Physical Skills ADL;FMC;Coordination;Flexibility;Dexterity;Pain;UE functional use;IADL;ROM    Rehab Potential Good    Clinical Decision Making Limited  treatment options, no task modification  necessary    Comorbidities Affecting Occupational Performance: None    Modification or Assistance to Complete Evaluation  No modification of tasks or assist necessary to complete eval    OT Frequency Monthly    OT Duration 4 weeks    OT Treatment/Interventions Self-care/ADL training;DME and/or AE instruction;Splinting;Therapeutic exercise;Manual Therapy;Passive range of motion    Consulted and Agree with Plan of Care Patient             Patient will benefit from skilled therapeutic intervention in order to improve the following deficits and impairments:   Body Structure / Function / Physical Skills: ADL, FMC, Coordination, Flexibility, Dexterity, Pain, UE functional use, IADL, ROM       Visit Diagnosis: Stiffness of left hand, not elsewhere classified  Pain in left hand    Problem List Patient Active Problem List   Diagnosis Date Noted   Mild neurocognitive disorder 09/14/2020   Generalized anxiety disorder    Coarse tremors 12/29/2019   Diplopia 12/29/2019   Esotropia, intermittent 12/29/2019   Regular astigmatism of both eyes 12/29/2019   High risk medication use 05/29/2018   History of IBS 05/29/2018   Major depressive disorder in remission 07/15/2017   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, [place], [address] Phone: [phone]   Fax:  [phone]  Name: [name] [name] MRN: [id number] Date of Birth: [birth year]

## 2021-03-22 ENCOUNTER — Other Ambulatory Visit: Payer: Self-pay

## 2021-03-22 ENCOUNTER — Ambulatory Visit: Payer: Medicare Other

## 2021-03-22 DIAGNOSIS — M79672 Pain in left foot: Secondary | ICD-10-CM

## 2021-03-22 DIAGNOSIS — M79671 Pain in right foot: Secondary | ICD-10-CM | POA: Diagnosis not present

## 2021-03-22 DIAGNOSIS — M25642 Stiffness of left hand, not elsewhere classified: Secondary | ICD-10-CM | POA: Diagnosis not present

## 2021-03-22 DIAGNOSIS — M79642 Pain in left hand: Secondary | ICD-10-CM | POA: Diagnosis not present

## 2021-03-22 NOTE — Therapy (Signed)
[place] [facility] [address], [place], [address] Phone: [phone]   Fax:  [phone]  Physical Therapy Treatment  Patient Details  Name: [name] [name] MRN: [id number] Date of Birth: 1951-01-16 Referring Provider (PT): Bo Merino, MD   Encounter Date: 03/22/2021   PT End of Session - 03/22/21 1018     Visit Number 7    Number of Visits 17    Date for PT Re-Evaluation 04/27/21    Authorization Type Traditional Medicare    Authorization Time Period 02/27/21-04/27/21    PT Start Time 1018    PT Stop Time 1056    PT Time Calculation (min) 38 min    Activity Tolerance Patient tolerated treatment well    Behavior During Therapy [facility] for tasks assessed/performed             Past Medical History:  Diagnosis Date   Allergic-infective asthma 12/24/2007   FENO- 08/07/16- 25 Office spirometry 08/07/16- WNL, FEV1 3.59/ 110%, R 0.81    Asthmatic bronchitis with acute exacerbation 10/15/2016   Coarse tremors 12/29/2019   Upper extremities, bilateral   Diplopia 12/29/2019   Generalized anxiety disorder    GERD (gastroesophageal reflux disease) 12/24/2007   Major depressive disorder in remission 07/15/2017   Mild neurocognitive disorder 09/14/2020   Polyp of nasal cavity 12/24/2007   Regular astigmatism of both eyes 12/29/2019   Rheumatoid arthritis of multiple sites without rheumatoid factor 09/18/2010   Seasonal and perennial allergic rhinitis 12/24/2007   Sinusitis, chronic 12/24/2007   Recurrent acute maxillary and frontal sinusitis     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   Subjective Assessment  - 03/22/21 1019     Subjective Very little pain in L foot.    Pertinent History RA, B hand and foot pain. Pt having tremmors. Difficulty coordinating his L hand due to tremmors and arthritis. Has B hand arthritis at all his knuckles/mcp joints. B foot pain L > R. Body feels like it is trying to make his feet go into B PF. Foot pain located at the MTP joints bilaterally L > R. Pt started having tremmors when taking abylify. B feet started bothering him when pt started having tremmors which was about 2 months ago around March 2022.    Currently in Pain? Other (Comment)   very little pain                                      PT Education - 03/22/21 1047     Education Details ther-ex    Person(s) Educated Patient    Methods Explanation;Demonstration;Tactile cues;Verbal cues    Comprehension Verbalized understanding;Returned demonstration             Objective     No latex allergies Blood pressure is controlled   Pain location: toe extensor tendons as well as PIP (proximal interphalangeal) joints. Pt states having hammer toes for about 2 years   [employer] Access Code [email or web address]     Manual therapy Seated toe  extension stretch 2-4th digits with gentle joint distractions  Seated STM L calf targeting the toe flexor muscles Seated STM plantar foot to decrease muscle tension and fascial restritions   Therapeutic exercise Seated Toe extension stretch L foot 10x5 seconds for 3 sets  Standing gastroc stretch at rocker board 30 seconds x 5   Heel walking 32 ft x 4    Improved exercise technique, movement at target joints, use of target muscles after mod verbal, visual, tactile cues.           Response to treatment Pt tolerated session well without aggravation of symptoms.        Clinical impression Improving toe extension observed during treatments. Pt making good progress with decreased L foot pain based on subjective reports. Pt will benefit  from continued skilled physical therapy services to decrease pain, improve strength and function.          PT Short Term Goals - 02/27/21 1839       PT SHORT TERM GOAL #1   Title Pt will be independent with his initial HEP to improve toe extension, decrease pain, improve ability to ambulate more comfortably.    Time 3    Period Weeks    Status New    Target Date 03/23/21               PT Long Term Goals - 02/27/21 1840       PT LONG TERM GOAL #1   Title Pt will have a decrease in L and R foot/toe(s) pain to 2/10 or less at most to promote ability to ambulate longer distances, negotiate inclines and stairs more comfortably.    Baseline 5/10 R and L foot/toe(s) pain at most for the past month (02/27/2021)    Time 8    Period Weeks    Status New    Target Date 04/27/21      PT LONG TERM GOAL #2   Title Pt will report being able to ambulate at the treadmill for 30 minutes with less pain in B feet to promote fitness.    Baseline Increased pain with gait at treadmill for 20-30 minutes (02/27/2021)    Time 8    Period Weeks    Status New    Target Date 04/27/21      PT LONG TERM GOAL #3   Title Pt will improve his FOTO score by at least 10 points as a demonstration of improved function.    Baseline Lower leg FOTO 58 (02/27/2021)    Time 8    Period Weeks    Status New    Target Date 04/27/21                   Plan - 03/22/21 1017     Clinical Impression Statement Improving toe extension observed during treatments. Pt making good progress with decreased L foot pain based on subjective reports. Pt will benefit from continued skilled physical therapy services to decrease pain, improve strength and function.    Personal Factors and Comorbidities Age;Comorbidity 3+;Past/Current Experience;Time since onset of injury/illness/exacerbation    Comorbidities Major depressive disorder, RA, mild neurocognitive disorder    Examination-Activity Limitations Locomotion  Level;Stairs    Stability/Clinical Decision Making Stable/Uncomplicated    Rehab Potential Fair    PT Frequency 2x / week    PT Duration 8 weeks    PT Treatment/Interventions Manual techniques;Therapeutic exercise;Therapeutic activities;Electrical Stimulation;Iontophoresis 4mg /ml Dexamethasone;Neuromuscular re-education;Patient/family education    PT Next Visit Plan toe  extension, manual technies, strengthening, modalties PRN    Consulted and Agree with Plan of Care Patient             Patient will benefit from skilled therapeutic intervention in order to improve the following deficits and impairments:  Pain, Improper body mechanics, Difficulty walking, Decreased range of motion  Visit Diagnosis: Pain in left foot     Problem List Patient Active Problem List   Diagnosis Date Noted   Mild neurocognitive disorder 09/14/2020   Generalized anxiety disorder    Coarse tremors 12/29/2019   Diplopia 12/29/2019   Esotropia, intermittent 12/29/2019   Regular astigmatism of both eyes 12/29/2019   High risk medication use 05/29/2018   History of IBS 05/29/2018   Major depressive disorder in remission 07/15/2017   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, [place], [address] Phone: [phone]   Fax:  [phone]  Name: [name] [name] MRN: [id number] Date of Birth: 12/08/1950

## 2021-03-27 ENCOUNTER — Other Ambulatory Visit: Payer: Self-pay

## 2021-03-27 ENCOUNTER — Ambulatory Visit: Payer: Medicare Other

## 2021-03-27 DIAGNOSIS — M79672 Pain in left foot: Secondary | ICD-10-CM | POA: Diagnosis not present

## 2021-03-27 DIAGNOSIS — M79671 Pain in right foot: Secondary | ICD-10-CM | POA: Diagnosis not present

## 2021-03-27 DIAGNOSIS — M79642 Pain in left hand: Secondary | ICD-10-CM | POA: Diagnosis not present

## 2021-03-27 DIAGNOSIS — M25642 Stiffness of left hand, not elsewhere classified: Secondary | ICD-10-CM | POA: Diagnosis not present

## 2021-03-27 NOTE — Therapy (Signed)
[place] [facility] [address], [place], [address] Phone: [phone]   Fax:  [phone]  Physical Therapy Treatment  Patient Details  Name: [name] [name] MRN: [id number] Date of Birth: 05/23/1951 Referring Provider (PT): Bo Merino, MD   Encounter Date: 03/27/2021   PT End of Session - 03/27/21 1630     Visit Number 8    Number of Visits 17    Date for PT Re-Evaluation 04/27/21    Authorization Type Traditional Medicare    Authorization Time Period 02/27/21-04/27/21    PT Start Time 1630    PT Stop Time 1710    PT Time Calculation (min) 40 min    Activity Tolerance Patient tolerated treatment well    Behavior During Therapy [facility] for tasks assessed/performed             Past Medical History:  Diagnosis Date   Allergic-infective asthma 12/24/2007   FENO- 08/07/16- 25 Office spirometry 08/07/16- WNL, FEV1 3.59/ 110%, R 0.81    Asthmatic bronchitis with acute exacerbation 10/15/2016   Coarse tremors 12/29/2019   Upper extremities, bilateral   Diplopia 12/29/2019   Generalized anxiety disorder    GERD (gastroesophageal reflux disease) 12/24/2007   Major depressive disorder in remission 07/15/2017   Mild neurocognitive disorder 09/14/2020   Polyp of nasal cavity 12/24/2007   Regular astigmatism of both eyes 12/29/2019   Rheumatoid arthritis of multiple sites without rheumatoid factor 09/18/2010   Seasonal and perennial allergic rhinitis 12/24/2007   Sinusitis, chronic 12/24/2007   Recurrent acute maxillary and frontal sinusitis     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   Subjective Assessment  - 03/27/21 1631     Subjective 2-3/10 L foot pain during gait. Feels better usually after session.    Pertinent History RA, B hand and foot pain. Pt having tremmors. Difficulty coordinating his L hand due to tremmors and arthritis. Has B hand arthritis at all his knuckles/mcp joints. B foot pain L > R. Body feels like it is trying to make his feet go into B PF. Foot pain located at the MTP joints bilaterally L > R. Pt started having tremmors when taking abylify. B feet started bothering him when pt started having tremmors which was about 2 months ago around March 2022.    Currently in Pain? Yes    Pain Score 3                                        PT Education - 03/27/21 1700     Education Details ther-ex    Person(s) Educated Patient    Methods Explanation;Demonstration;Tactile cues;Verbal cues    Comprehension Returned demonstration;Verbalized understanding           Objective     No latex allergies Blood pressure is controlled   Pain location: toe extensor tendons as well as PIP (proximal interphalangeal) joints. Pt states having hammer toes for about 2 years   [place]  Manual therapy Seated toe extension stretch 2-4th digits with gentle joint distractions  Seated STM L calf targeting the toe flexor muscles Seated STM plantar foot to decrease muscle tension and fascial restritions   Therapeutic exercise Seated Toe extension stretch L foot 10x5 seconds for 3 sets   Standing gastroc stretch at rocker board 30 seconds x 5   Heel walking 32 ft x 4   Toe extension stretch at door/wall 30 seconds 2x     Improved exercise technique, movement at target joints, use of target muscles after mod verbal, visual, tactile cues.              Response to treatment Pt tolerated session well without aggravation of symptoms.        Clinical impression Pt continues to demonstrate improved toe extension ROM after manual  therapy. Decreasing joint stiffness palpated. Continued following up manual therapy with stretches to continue to promote and maintain ROM gains. Pt tolerated session well without aggravation of symptoms. Pt will benefit from continued skilled physical therapy services to decrease pain, improve strength and function.      PT Short Term Goals - 02/27/21 1839       PT SHORT TERM GOAL #1   Title Pt will be independent with his initial HEP to improve toe extension, decrease pain, improve ability to ambulate more comfortably.    Time 3    Period Weeks    Status New    Target Date 03/23/21               PT Long Term Goals - 02/27/21 1840       PT LONG TERM GOAL #1   Title Pt will have a decrease in L and R foot/toe(s) pain to 2/10 or less at most to promote ability to ambulate longer distances, negotiate inclines and stairs more comfortably.    Baseline 5/10 R and L foot/toe(s) pain at most for the past month (02/27/2021)    Time 8    Period Weeks    Status New    Target Date 04/27/21      PT LONG TERM GOAL #2   Title Pt will report being able to ambulate at the treadmill for 30 minutes with less pain in B feet to promote fitness.    Baseline Increased pain with gait at treadmill for 20-30 minutes (02/27/2021)    Time 8    Period Weeks    Status New    Target Date 04/27/21      PT LONG TERM GOAL #3   Title Pt will improve his FOTO score by at least 10 points as a demonstration of improved function.    Baseline Lower leg FOTO 58 (02/27/2021)    Time 8    Period Weeks    Status New    Target Date 04/27/21                   Plan - 03/27/21 1700     Clinical Impression Statement Pt continues to demonstrate improved toe extension ROM after manual therapy. Decreasing joint stiffness palpated. Continued following up manual therapy with stretches to continue to promote and maintain ROM gains. Pt tolerated session well without aggravation of symptoms. Pt will benefit from  continued skilled physical therapy services to decrease pain, improve strength and function.    Personal Factors and Comorbidities Age;Comorbidity 3+;Past/Current Experience;Time since onset of injury/illness/exacerbation    Comorbidities Major depressive disorder, RA, mild neurocognitive disorder    Examination-Activity  Limitations Locomotion Level;Stairs    Stability/Clinical Decision Making Stable/Uncomplicated    Rehab Potential Fair    PT Frequency 2x / week    PT Duration 8 weeks    PT Treatment/Interventions Manual techniques;Therapeutic exercise;Therapeutic activities;Electrical Stimulation;Iontophoresis 4mg /ml Dexamethasone;Neuromuscular re-education;Patient/family education    PT Next Visit Plan toe extension, manual technies, strengthening, modalties PRN    Consulted and Agree with Plan of Care Patient             Patient will benefit from skilled therapeutic intervention in order to improve the following deficits and impairments:  Pain, Improper body mechanics, Difficulty walking, Decreased range of motion  Visit Diagnosis: Pain in left foot     Problem List Patient Active Problem List   Diagnosis Date Noted   Mild neurocognitive disorder 09/14/2020   Generalized anxiety disorder    Coarse tremors 12/29/2019   Diplopia 12/29/2019   Esotropia, intermittent 12/29/2019   Regular astigmatism of both eyes 12/29/2019   High risk medication use 05/29/2018   History of IBS 05/29/2018   Major depressive disorder in remission 07/15/2017   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, [place], [address] Phone: [phone]   Fax:  [phone]  Name: JULEZ HUSEBY MRN: [id number] Date of Birth: 07-15-1951

## 2021-03-29 ENCOUNTER — Other Ambulatory Visit: Payer: Self-pay

## 2021-03-29 ENCOUNTER — Ambulatory Visit: Payer: Medicare Other

## 2021-03-29 DIAGNOSIS — M79642 Pain in left hand: Secondary | ICD-10-CM | POA: Diagnosis not present

## 2021-03-29 DIAGNOSIS — M79671 Pain in right foot: Secondary | ICD-10-CM | POA: Diagnosis not present

## 2021-03-29 DIAGNOSIS — M25642 Stiffness of left hand, not elsewhere classified: Secondary | ICD-10-CM | POA: Diagnosis not present

## 2021-03-29 DIAGNOSIS — M79672 Pain in left foot: Secondary | ICD-10-CM

## 2021-03-29 NOTE — Therapy (Signed)
[place] [address] [place], [address] Phone: [phone]   Fax:  [phone]  Physical Therapy Treatment  Patient Details  Name: [name] [name] MRN: [id number] Date of Birth: 02/25/1951 Referring Provider (PT): Bo Merino, MD   Encounter Date: 03/29/2021   PT End of Session - 03/29/21 1323     Visit Number 9    Number of Visits 17    Date for PT Re-Evaluation 04/27/21    Authorization Type Traditional Medicare    Authorization Time Period 02/27/21-04/27/21    PT Start Time 1323    PT Stop Time 1403    PT Time Calculation (min) 40 min    Activity Tolerance Patient tolerated treatment well    Behavior During Therapy [facility] for tasks assessed/performed             Past Medical History:  Diagnosis Date   Allergic-infective asthma 12/24/2007   FENO- 08/07/16- 25 Office spirometry 08/07/16- WNL, FEV1 3.59/ 110%, R 0.81    Asthmatic bronchitis with acute exacerbation 10/15/2016   Coarse tremors 12/29/2019   Upper extremities, bilateral   Diplopia 12/29/2019   Generalized anxiety disorder    GERD (gastroesophageal reflux disease) 12/24/2007   Major depressive disorder in remission 07/15/2017   Mild neurocognitive disorder 09/14/2020   Polyp of nasal cavity 12/24/2007   Regular astigmatism of both eyes 12/29/2019   Rheumatoid arthritis of multiple sites without rheumatoid factor 09/18/2010   Seasonal and perennial allergic rhinitis 12/24/2007   Sinusitis, chronic 12/24/2007   Recurrent acute maxillary and frontal sinusitis     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   Subjective Assessment  - 03/29/21 1324     Subjective L foot is good. 2/10 currently with gait.    Pertinent History RA, B hand and foot pain. Pt having tremmors. Difficulty coordinating his L hand due to tremmors and arthritis. Has B hand arthritis at all his knuckles/mcp joints. B foot pain L > R. Body feels like it is trying to make his feet go into B PF. Foot pain located at the MTP joints bilaterally L > R. Pt started having tremmors when taking abylify. B feet started bothering him when pt started having tremmors which was about 2 months ago around March 2022.    Currently in Pain? Yes    Pain Score 2                                        PT Education - 03/29/21 1352     Education Details ther-ex    Person(s) Educated Patient    Methods Explanation;Demonstration;Tactile cues;Verbal cues    Comprehension Returned demonstration;Verbalized understanding            Objective     No latex allergies Blood pressure is controlled   Pain location: toe extensor tendons as well as PIP (proximal interphalangeal) joints. Pt states having hammer toes for about 2 years   Medbridge Access Code [email or web address]     Manual  therapy Seated toe extension stretch 2-4th digits with gentle joint distractions  Seated STM L calf targeting the toe flexor muscles Seated STM plantar foot to decrease muscle tension and fascial restritions   Therapeutic exercise Seated Toe extension stretch L foot 10x5 seconds for 3 sets   Treadmill walk speed 2.0 to 1.8 for 5 minutes     Improved exercise technique, movement at target joints, use of target muscles after mod verbal, visual, tactile cues.              Response to treatment Pt tolerated session well without aggravation of symptoms.        Clinical impression Continued working on improving PIP joint extension toes 2-4 to promote ability to ambulate more comfortably. Decrease joint stiffness palpated with manual therapy. Pt tolerated  session well without aggravation of symptoms. Pt will benefit from continued skilled physical therapy services to decrease pain, improve strength and function.        PT Short Term Goals - 02/27/21 1839       PT SHORT TERM GOAL #1   Title Pt will be independent with his initial HEP to improve toe extension, decrease pain, improve ability to ambulate more comfortably.    Time 3    Period Weeks    Status New    Target Date 03/23/21               PT Long Term Goals - 02/27/21 1840       PT LONG TERM GOAL #1   Title Pt will have a decrease in L and R foot/toe(s) pain to 2/10 or less at most to promote ability to ambulate longer distances, negotiate inclines and stairs more comfortably.    Baseline 5/10 R and L foot/toe(s) pain at most for the past month (02/27/2021)    Time 8    Period Weeks    Status New    Target Date 04/27/21      PT LONG TERM GOAL #2   Title Pt will report being able to ambulate at the treadmill for 30 minutes with less pain in B feet to promote fitness.    Baseline Increased pain with gait at treadmill for 20-30 minutes (02/27/2021)    Time 8    Period Weeks    Status New    Target Date 04/27/21      PT LONG TERM GOAL #3   Title Pt will improve his FOTO score by at least 10 points as a demonstration of improved function.    Baseline Lower leg FOTO 58 (02/27/2021)    Time 8    Period Weeks    Status New    Target Date 04/27/21                   Plan - 03/29/21 1320     Clinical Impression Statement Continued working on improving PIP joint extension toes 2-4 to promote ability to ambulate more comfortably. Decrease joint stiffness palpated with manual therapy. Pt tolerated session well without aggravation of symptoms. Pt will benefit from continued skilled physical therapy services to decrease pain, improve strength and function.    Personal Factors and Comorbidities Age;Comorbidity 3+;Past/Current Experience;Time since onset of  injury/illness/exacerbation    Comorbidities Major depressive disorder, RA, mild neurocognitive disorder    Examination-Activity Limitations Locomotion Level;Stairs    Stability/Clinical Decision Making Stable/Uncomplicated    Rehab Potential Fair    PT Frequency 2x / week    PT Duration 8 weeks  PT Treatment/Interventions Manual techniques;Therapeutic exercise;Therapeutic activities;Electrical Stimulation;Iontophoresis 4mg /ml Dexamethasone;Neuromuscular re-education;Patient/family education    PT Next Visit Plan toe extension, manual technies, strengthening, modalties PRN    Consulted and Agree with Plan of Care Patient             Patient will benefit from skilled therapeutic intervention in order to improve the following deficits and impairments:  Pain, Improper body mechanics, Difficulty walking, Decreased range of motion  Visit Diagnosis: Pain in left foot     Problem List Patient Active Problem List   Diagnosis Date Noted   Mild neurocognitive disorder 09/14/2020   Generalized anxiety disorder    Coarse tremors 12/29/2019   Diplopia 12/29/2019   Esotropia, intermittent 12/29/2019   Regular astigmatism of both eyes 12/29/2019   High risk medication use 05/29/2018   History of IBS 05/29/2018   Major depressive disorder in remission 07/15/2017   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 [place], [address] Phone: [phone]    Fax:  [phone]  Name: [name] [name] MRN: [id number] Date of Birth: 23-Nov-1950

## 2021-04-03 ENCOUNTER — Ambulatory Visit: Payer: Medicare Other

## 2021-04-03 DIAGNOSIS — M25642 Stiffness of left hand, not elsewhere classified: Secondary | ICD-10-CM | POA: Diagnosis not present

## 2021-04-03 DIAGNOSIS — M79671 Pain in right foot: Secondary | ICD-10-CM | POA: Diagnosis not present

## 2021-04-03 DIAGNOSIS — M79642 Pain in left hand: Secondary | ICD-10-CM | POA: Diagnosis not present

## 2021-04-03 DIAGNOSIS — M79672 Pain in left foot: Secondary | ICD-10-CM

## 2021-04-03 NOTE — Therapy (Signed)
[facility] [address] [place], [address] Phone: [phone]   Fax:  [phone]  Physical Therapy Treatment And Progress Report (02/27/2021 - 04/03/2021)  Patient Details  Name: [name] [name] MRN: [id number] Date of Birth: [birth year] Referring Provider (PT): Bo Merino, MD   Encounter Date: 04/03/2021   PT End of Session - 04/03/21 1416     Visit Number 10    Number of Visits 17    Date for PT Re-Evaluation 04/27/21    Authorization Type Traditional Medicare    Authorization Time Period 02/27/21-04/27/21    PT Start Time 1416    PT Stop Time 1452    PT Time Calculation (min) 36 min    Activity Tolerance Patient tolerated treatment well    Behavior During Therapy [facility] for tasks assessed/performed             Past Medical History:  Diagnosis Date   Allergic-infective asthma 12/24/2007   FENO- 08/07/16- 25 Office spirometry 08/07/16- WNL, FEV1 3.59/ 110%, R 0.81    Asthmatic bronchitis with acute exacerbation 10/15/2016   Coarse tremors 12/29/2019   Upper extremities, bilateral   Diplopia 12/29/2019   Generalized anxiety disorder    GERD (gastroesophageal reflux disease) 12/24/2007   Major depressive disorder in remission 07/15/2017   Mild neurocognitive disorder 09/14/2020   Polyp of nasal cavity 12/24/2007   Regular astigmatism of both eyes 12/29/2019   Rheumatoid arthritis of multiple sites without rheumatoid factor 09/18/2010   Seasonal and perennial allergic rhinitis 12/24/2007   Sinusitis, chronic 12/24/2007   Recurrent acute maxillary and frontal sinusitis     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   Subjective Assessment - 04/03/21 1417     Subjective Walked on the treadmill Friday, Saturday and Today, 20 min each at 2.2 mph. L foot did fine. Bothered him initially but pain decreased with 2nd and 3rd treadmill attempts. No L foot pain currently.    Pertinent History RA, B hand and foot pain. Pt having tremmors. Difficulty coordinating his L hand due to tremmors and arthritis. Has B hand arthritis at all his knuckles/mcp joints. B foot pain L > R. Body feels like it is trying to make his feet go into B PF. Foot pain located at the MTP joints bilaterally L > R. Pt started having tremmors when taking abylify. B feet started bothering him when pt started having tremmors which was about 2 months ago around March 2022.    Currently in Pain? No/denies    Pain Score 0-No pain                                       PT Education - 04/03/21 1419     Education Details ther-ex    Person(s) Educated Patient    Methods Explanation;Demonstration;Tactile cues;Verbal cues    Comprehension Returned demonstration;Verbalized understanding              Objective     No latex allergies Blood pressure is  controlled   Pain location: toe extensor tendons as well as PIP (proximal interphalangeal) joints. Pt states having hammer toes for about 2 years   Medbridge Access Code [email or web address]     Manual therapy Seated toe extension stretch 2-4th digits with gentle joint distractions  Seated STM L calf targeting the toe flexor muscles Seated STM plantar foot to decrease muscle tension and fascial restritions   Therapeutic exercise Seated toe extension stretch L foot 10x5 seconds for 3 sets  Improved exercise technique, movement at target joints, use of target muscles after mod verbal, visual, tactile cues.           Response to treatment Pt tolerated session well without aggravation of symptoms.        Clinical impression Pt demonstrates  overall decreased L foot pain, improved function, and ability to ambulate longer distances more comfortably since initial evaluation. Continued working on manual techniques to promote  PIP extension digits 2-4 to continue improve ability to ambulate more  comfortably. Pt tolerated session well without aggravation of symptoms. Pt will benefit from continued skilled physical therapy services to decrease pain, improve strength and function.      PT Short Term Goals - 04/03/21 1419       PT SHORT TERM GOAL #1   Title Pt will be independent with his initial HEP to improve toe extension, decrease pain, improve ability to ambulate more comfortably.    Baseline Does his HEP, no questions (04/03/2021)    Time 3    Period Weeks    Status Achieved    Target Date 03/23/21               PT Long Term Goals - 04/03/21 1420       PT LONG TERM GOAL #1   Title Pt will have a decrease in L and R foot/toe(s) pain to 2/10 or less at most to promote ability to ambulate longer distances, negotiate inclines and stairs more comfortably.    Baseline 5/10 R and L foot/toe(s) pain at most for the past month (02/27/2021); 2/10 L foot and 0/10 R foot pain at most for the past 7 days (04/03/2021)    Time 8    Period Weeks    Status Achieved    Target Date 04/27/21      PT LONG TERM GOAL #2   Title Pt will report being able to ambulate at the treadmill for 30 minutes with less pain in B feet to promote fitness.    Baseline Increased pain with gait at treadmill for 20-30 minutes (02/27/2021); Able to walk on treadmill 2.2 MPH for 2 min 3 times with minimal symptoms (04/03/2021)    Time 8    Period Weeks    Status Partially Met    Target Date 04/27/21      PT LONG TERM GOAL #3   Title Pt will improve his FOTO score by at least 10 points as a demonstration of improved function.    Baseline Lower leg FOTO 58 (02/27/2021); 61 (04/03/2021)    Time 8    Period Weeks    Status Partially Met    Target Date 04/27/21                    Plan - 04/03/21 1416     Clinical Impression Statement Pt demonstrates overall decreased L foot pain, improved function, and ability to ambulate longer distances more comfortably since initial evaluation. Continued working on manual techniques to promote    PIP extension digits 2-4 to continue improve ability to ambulate more  comfortably. Pt tolerated session well without aggravation of symptoms. Pt will benefit from continued skilled physical therapy services to decrease pain, improve strength and function.    Personal Factors and Comorbidities Age;Comorbidity 3+;Past/Current Experience;Time since onset of injury/illness/exacerbation    Comorbidities Major depressive disorder, RA, mild neurocognitive disorder    Examination-Activity Limitations Locomotion Level;Stairs    Stability/Clinical Decision Making Stable/Uncomplicated    Rehab Potential Fair    PT Frequency 2x / week    PT Duration 8 weeks    PT Treatment/Interventions Manual techniques;Therapeutic exercise;Therapeutic activities;Electrical Stimulation;Iontophoresis 1m/ml Dexamethasone;Neuromuscular re-education;Patient/family education    PT Next Visit Plan toe extension, manual technies, strengthening, modalties PRN    Consulted and Agree with Plan of Care Patient             Patient will benefit from skilled therapeutic intervention in order to improve the following deficits and impairments:  Pain, Improper body mechanics, Difficulty walking, Decreased range of motion  Visit Diagnosis: Pain in left foot     Problem List Patient Active Problem List   Diagnosis Date Noted   Mild neurocognitive disorder 09/14/2020   Generalized anxiety disorder    Coarse tremors 12/29/2019   Diplopia 12/29/2019   Esotropia, intermittent 12/29/2019   Regular astigmatism of both eyes 12/29/2019   High risk medication use 05/29/2018   History of IBS 05/29/2018   Major depressive disorder in remission  07/15/2017   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   MJoneen BoersPT, DPT   04/03/2021, 3:06 PM  [facility] [address] [place] [address]Phone: [phone]  Fax:  [phone] Name: JABEM SHADDIXMRN: [id number] of Birth: [birth year]

## 2021-04-05 ENCOUNTER — Ambulatory Visit: Payer: Medicare Other

## 2021-04-05 ENCOUNTER — Other Ambulatory Visit: Payer: Self-pay

## 2021-04-05 DIAGNOSIS — M79642 Pain in left hand: Secondary | ICD-10-CM | POA: Diagnosis not present

## 2021-04-05 DIAGNOSIS — M79672 Pain in left foot: Secondary | ICD-10-CM

## 2021-04-05 DIAGNOSIS — M79671 Pain in right foot: Secondary | ICD-10-CM | POA: Diagnosis not present

## 2021-04-05 DIAGNOSIS — M25642 Stiffness of left hand, not elsewhere classified: Secondary | ICD-10-CM | POA: Diagnosis not present

## 2021-04-05 NOTE — Therapy (Signed)
[facility] [address] [place], [address] Phone: [phone]   Fax:  [phone]  Physical Therapy Treatment  Patient Details  Name: [name] [name] MRN: [id number] Date of Birth: 10-18-1950 Referring Provider (PT): Bo Merino, MD   Encounter Date: 04/05/2021   PT End of Session - 04/05/21 1133     Visit Number 11    Number of Visits 17    Date for PT Re-Evaluation 04/27/21    Authorization Type Traditional Medicare    Authorization Time Period 02/27/21-04/27/21    PT Start Time 1134    PT Stop Time 1212    PT Time Calculation (min) 38 min    Activity Tolerance Patient tolerated treatment well    Behavior During Therapy [facility] for tasks assessed/performed             Past Medical History:  Diagnosis Date   Allergic-infective asthma 12/24/2007   FENO- 08/07/16- 25 Office spirometry 08/07/16- WNL, FEV1 3.59/ 110%, R 0.81    Asthmatic bronchitis with acute exacerbation 10/15/2016   Coarse tremors 12/29/2019   Upper extremities, bilateral   Diplopia 12/29/2019   Generalized anxiety disorder    GERD (gastroesophageal reflux disease) 12/24/2007   Major depressive disorder in remission 07/15/2017   Mild neurocognitive disorder 09/14/2020   Polyp of nasal cavity 12/24/2007   Regular astigmatism of both eyes 12/29/2019   Rheumatoid arthritis of multiple sites without rheumatoid factor 09/18/2010   Seasonal and perennial allergic rhinitis 12/24/2007   Sinusitis, chronic 12/24/2007   Recurrent acute maxillary and frontal sinusitis     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   Subjective  Assessment - 04/05/21 1134     Subjective Did 20 min of treadmill walking this morning at 2.3 MPH and his L foot did fine. 2/10 currently L foot when walking.    Pertinent History RA, B hand and foot pain. Pt having tremmors. Difficulty coordinating his L hand due to tremmors and arthritis. Has B hand arthritis at all his knuckles/mcp joints. B foot pain L > R. Body feels like it is trying to make his feet go into B PF. Foot pain located at the MTP joints bilaterally L > R. Pt started having tremmors when taking abylify. B feet started bothering him when pt started having tremmors which was about 2 months ago around March 2022.    Currently in Pain? Yes    Pain Score 2                                        PT Education - 04/05/21 1201     Education Details ther-ex    Person(s) Educated Patient    Methods Explanation;Demonstration;Tactile cues;Verbal cues    Comprehension Returned demonstration;Verbalized understanding            Objective     No latex allergies Blood pressure is controlled   Pain location: toe extensor tendons as well as PIP (proximal interphalangeal) joints. Pt states having hammer toes  for about 2 years   Medbridge Access Code [email or web address]     Manual therapy Seated toe extension stretch 2-4th digits with gentle joint distractions  Seated STM L calf targeting the toe flexor muscles Seated STM plantar foot to decrease muscle tension and fascial restritions   Therapeutic exercise  Seated toe spreads 10x5 seconds   Seated toe extension stretch L foot 10x5 seconds for 3 sets  Seated L ankle DF 10x5 seconds for 2 sets     Improved exercise technique, movement at target joints, use of target muscles after mod verbal, visual, tactile cues.           Response to treatment Pt tolerated session well without aggravation of symptoms.        Clinical impression Pt continues to tolerate treadmill walking at least 20 minutes well  based on subjective reports. Continued working on improving toe extension ROM to promote ability to perform foot flat and push off phases of gait more comfortably. Pt tolerated session well without aggravation of symptoms. Pt will benefit from continued skilled physical therapy services to decrease pain, improve strength and function.     PT Short Term Goals - 04/03/21 1419       PT SHORT TERM GOAL #1   Title Pt will be independent with his initial HEP to improve toe extension, decrease pain, improve ability to ambulate more comfortably.    Baseline Does his HEP, no questions (04/03/2021)    Time 3    Period Weeks    Status Achieved    Target Date 03/23/21               PT Long Term Goals - 04/03/21 1420       PT LONG TERM GOAL #1   Title Pt will have a decrease in L and R foot/toe(s) pain to 2/10 or less at most to promote ability to ambulate longer distances, negotiate inclines and stairs more comfortably.    Baseline 5/10 R and L foot/toe(s) pain at most for the past month (02/27/2021); 2/10 L foot and 0/10 R foot pain at most for the past 7 days (04/03/2021)    Time 8    Period Weeks    Status Achieved    Target Date 04/27/21      PT LONG TERM GOAL #2   Title Pt will report being able to ambulate at the treadmill for 30 minutes with less pain in B feet to promote fitness.    Baseline Increased pain with gait at treadmill for 20-30 minutes (02/27/2021); Able to walk on treadmill 2.2 MPH for 2 min 3 times with minimal symptoms (04/03/2021)    Time 8    Period Weeks    Status Partially Met    Target Date 04/27/21      PT LONG TERM GOAL #3   Title Pt will improve his FOTO score by at least 10 points as a demonstration of improved function.    Baseline Lower leg FOTO 58 (02/27/2021); 61 (04/03/2021)    Time 8    Period Weeks    Status Partially Met    Target Date 04/27/21                   Plan - 04/05/21 1201     Clinical Impression Statement Pt continues to  tolerate treadmill walking at least 20 minutes well based on subjective reports. Continued working on improving toe extension ROM to promote ability to perform foot flat and push off  phases of gait more comfortably. Pt tolerated session well without aggravation of symptoms. Pt will benefit from continued skilled physical therapy services to decrease pain, improve strength and function.    Personal Factors and Comorbidities Age;Comorbidity 3+;Past/Current Experience;Time since onset of injury/illness/exacerbation    Comorbidities Major depressive disorder, RA, mild neurocognitive disorder    Examination-Activity Limitations Locomotion Level;Stairs    Stability/Clinical Decision Making Stable/Uncomplicated    Rehab Potential Fair    PT Frequency 2x / week    PT Duration 8 weeks    PT Treatment/Interventions Manual techniques;Therapeutic exercise;Therapeutic activities;Electrical Stimulation;Iontophoresis 20m/ml Dexamethasone;Neuromuscular re-education;Patient/family education    PT Next Visit Plan toe extension, manual technies, strengthening, modalties PRN    Consulted and Agree with Plan of Care Patient             Patient will benefit from skilled therapeutic intervention in order to improve the following deficits and impairments:  Pain, Improper body mechanics, Difficulty walking, Decreased range of motion  Visit Diagnosis: Pain in left foot     Problem List Patient Active Problem List   Diagnosis Date Noted   Mild neurocognitive disorder 09/14/2020   Generalized anxiety disorder    Coarse tremors 12/29/2019   Diplopia 12/29/2019   Esotropia, intermittent 12/29/2019   Regular astigmatism of both eyes 12/29/2019   High risk medication use 05/29/2018   History of IBS 05/29/2018   Major depressive disorder in remission 07/15/2017   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 [place] [address]Phone: [phone]  Fax:  [phone] Name: JARTHA CHIASSONMRN: [id number] of Birth: [birth year]

## 2021-04-06 ENCOUNTER — Ambulatory Visit (HOSPITAL_COMMUNITY)
Admission: RE | Admit: 2021-04-06 | Discharge: 2021-04-06 | Disposition: A | Discharge status: Home / Self Care | Payer: Medicare Other | Source: Ambulatory Visit | Attending: Rheumatology | Admitting: Rheumatology

## 2021-04-06 DIAGNOSIS — M0609 Rheumatoid arthritis without rheumatoid factor, multiple sites: Secondary | ICD-10-CM | POA: Diagnosis not present

## 2021-04-06 DIAGNOSIS — Z79899 Other long term (current) drug therapy: Secondary | ICD-10-CM | POA: Insufficient documentation

## 2021-04-06 LAB — COMPREHENSIVE METABOLIC PANEL
ALT: 21 U/L (ref 0–44)
AST: 25 U/L (ref 15–41)
Albumin: 3.9 g/dL (ref 3.5–5.0)
Alkaline Phosphatase: 53 U/L (ref 38–126)
Anion gap: 7 (ref 5–15)
BUN: 16 mg/dL (ref 8–23)
CO2: 29 mmol/L (ref 22–32)
Calcium: 9.8 mg/dL (ref 8.9–10.3)
Chloride: 103 mmol/L (ref 98–111)
Creatinine, Ser: 1.06 mg/dL (ref 0.61–1.24)
GFR, Estimated: >60 mL/min (ref >60)
Glucose, Bld: 102 mg/dL — ABNORMAL HIGH (ref 70–99)
Potassium: 4.4 mmol/L (ref 3.5–5.1)
Sodium: 139 mmol/L (ref 135–145)
Total Bilirubin: 0.7 mg/dL (ref 0.3–1.2)
Total Protein: 7 g/dL (ref 6.5–8.1)

## 2021-04-06 LAB — CBC WITH DIFFERENTIAL/PLATELET
Abs Immature Granulocytes: 0.01 10*3/uL (ref 0.00–0.07)
Basophils Absolute: 0 10*3/uL (ref 0.0–0.1)
Basophils Relative: 1 %
Eosinophils Absolute: 0.1 10*3/uL (ref 0.0–0.5)
Eosinophils Relative: 1 %
HCT: 40.3 % (ref 39.0–52.0)
Hemoglobin: 13.5 g/dL (ref 13.0–17.0)
Immature Granulocytes: 0 %
Lymphocytes Relative: 36 %
Lymphs Abs: 1.9 10*3/uL (ref 0.7–4.0)
MCH: 31.6 pg (ref 26.0–34.0)
MCHC: 33.5 g/dL (ref 30.0–36.0)
MCV: 94.4 fL (ref 80.0–100.0)
Monocytes Absolute: 0.7 10*3/uL (ref 0.1–1.0)
Monocytes Relative: 13 %
Neutro Abs: 2.7 10*3/uL (ref 1.7–7.7)
Neutrophils Relative %: 49 %
Platelets: 314 10*3/uL (ref 150–400)
RBC: 4.27 MIL/uL (ref 4.22–5.81)
RDW: 13.3 % (ref 11.5–15.5)
WBC: 5.5 10*3/uL (ref 4.0–10.5)
nRBC: 0 % (ref 0.0–0.2)

## 2021-04-06 MED ORDER — SODIUM CHLORIDE 0.9 % IV SOLN
750.0000 mg | INTRAVENOUS | Status: DC
  Administered 2021-04-06: 750 mg via INTRAVENOUS
  Filled 2021-04-06: qty 30

## 2021-04-06 MED ORDER — DIPHENHYDRAMINE HCL 25 MG PO CAPS: 25.0000 mg | ORAL_CAPSULE | ORAL | Status: DC

## 2021-04-06 MED ORDER — ACETAMINOPHEN 325 MG PO TABS: 650.0000 mg | ORAL_TABLET | ORAL | Status: DC

## 2021-04-06 NOTE — Progress Notes (Signed)
Glucose is 102. Rest of CMP WNL.  CBC WNL.

## 2021-04-07 ENCOUNTER — Telehealth: Payer: Self-pay | Admitting: Pharmacist

## 2021-04-07 NOTE — Telephone Encounter (Signed)
Patient will be receiving Orencia infusions every 6 weeks. At [place] with Dr. Estanislado Pandy on 02/07/21, patient had discussed wanting to increase frequency from every 8 weeks to every 4 weeks.  We had decided on every 4 weeks and if he wanted to space out to every 6 weeks based on control or if he was having infections again, we could do so. He and Dr. Estanislado Pandy were in agreement with this plan  He has Orencia infusion yesterday, 04/06/21 and has next infusion scheduled in 6 weeks on 05/18/21.  Will renew subsequent orders for every IV Orencia every 6 weeks  Knox Saliva, PharmD, MPH Clinical Pharmacist (Rheumatology and Pulmonology)

## 2021-04-11 ENCOUNTER — Ambulatory Visit: Payer: Medicare Other | Attending: Rheumatology

## 2021-04-11 DIAGNOSIS — M25642 Stiffness of left hand, not elsewhere classified: Secondary | ICD-10-CM | POA: Insufficient documentation

## 2021-04-11 DIAGNOSIS — M79672 Pain in left foot: Secondary | ICD-10-CM | POA: Insufficient documentation

## 2021-04-11 DIAGNOSIS — M79671 Pain in right foot: Secondary | ICD-10-CM | POA: Diagnosis not present

## 2021-04-11 DIAGNOSIS — M79642 Pain in left hand: Secondary | ICD-10-CM | POA: Diagnosis not present

## 2021-04-11 NOTE — Therapy (Signed)
[facility] [address] [place], [address] Phone: [phone]   Fax:  [phone]  Physical Therapy Treatment  Patient Details  Name: [name] [name] MRN: [id number] Date of Birth: 02/22/1951 Referring Provider (PT): Bo Merino, MD   Encounter Date: 04/11/2021   PT End of Session - 04/11/21 1102     Visit Number 12    Number of Visits 17    Date for PT Re-Evaluation 04/27/21    Authorization Type Traditional Medicare    Authorization Time Period 02/27/21-04/27/21    PT Start Time 1102    PT Stop Time 1142    PT Time Calculation (min) 40 min    Activity Tolerance Patient tolerated treatment well    Behavior During Therapy [facility] for tasks assessed/performed             Past Medical History:  Diagnosis Date   Allergic-infective asthma 12/24/2007   FENO- 08/07/16- 25 Office spirometry 08/07/16- WNL, FEV1 3.59/ 110%, R 0.81    Asthmatic bronchitis with acute exacerbation 10/15/2016   Coarse tremors 12/29/2019   Upper extremities, bilateral   Diplopia 12/29/2019   Generalized anxiety disorder    GERD (gastroesophageal reflux disease) 12/24/2007   Major depressive disorder in remission 07/15/2017   Mild neurocognitive disorder 09/14/2020   Polyp of nasal cavity 12/24/2007   Regular astigmatism of both eyes 12/29/2019   Rheumatoid arthritis of multiple sites without rheumatoid factor 09/18/2010   Seasonal and perennial allergic rhinitis 12/24/2007   Sinusitis, chronic 12/24/2007   Recurrent acute maxillary and frontal sinusitis     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   Subjective Assessment  - 04/11/21 1103     Subjective Increased walking time on treadmill to 25 min Friday, Saturday, and today. L foot did not bother him. No pain currently.    Pertinent History RA, B hand and foot pain. Pt having tremmors. Difficulty coordinating his L hand due to tremmors and arthritis. Has B hand arthritis at all his knuckles/mcp joints. B foot pain L > R. Body feels like it is trying to make his feet go into B PF. Foot pain located at the MTP joints bilaterally L > R. Pt started having tremmors when taking abylify. B feet started bothering him when pt started having tremmors which was about 2 months ago around March 2022.    Currently in Pain? No/denies    Pain Score 0-No pain                                       PT Education - 04/11/21 1131     Education Details ther-ex    Person(s) Educated Patient    Methods Explanation;Demonstration;Tactile cues;Verbal cues    Comprehension Returned demonstration;Verbalized understanding             Objective     No latex allergies Blood pressure is controlled   Pain location: toe extensor tendons as well as PIP (proximal interphalangeal) joints. Pt states having hammer toes for  about 2 years   Medbridge Access Code [email or web address]     Manual therapy Seated toe extension stretch 2-4th digits with gentle joint distractions  Seated STM L calf targeting the toe flexor muscles Seated STM plantar foot to decrease muscle tension and fascial restritions   Therapeutic exercise  Seated toe extension stretch L foot 10x5 seconds for 3 sets  Seated L ankle DF 10x5 seconds   Seated toe spreads 10x5 seconds for 2 sets  Standing ankle DF/PF on rocker board with B UE assist 2 min to promote ankle DF         Improved exercise technique, movement at target joints, use of target muscles after mod verbal, visual, tactile cues.           Response to treatment Pt tolerated session well without aggravation of symptoms.         Clinical impression Pt currently able to ambulate up to 25 minutes on the treadmill now without reports of pain. Pt making very good progress towards goals. Continued working on decreasing fascial restrictions as well as improving toe extension to promote more comfortable foot flat and push-off phase during gait. Pt tolerated session well without aggravation of symptoms. Pt will benefit from continued skilled physical therapy services to decrease pain, improve strength and function.      PT Short Term Goals - 04/03/21 1419       PT SHORT TERM GOAL #1   Title Pt will be independent with his initial HEP to improve toe extension, decrease pain, improve ability to ambulate more comfortably.    Baseline Does his HEP, no questions (04/03/2021)    Time 3    Period Weeks    Status Achieved    Target Date 03/23/21               PT Long Term Goals - 04/03/21 1420       PT LONG TERM GOAL #1   Title Pt will have a decrease in L and R foot/toe(s) pain to 2/10 or less at most to promote ability to ambulate longer distances, negotiate inclines and stairs more comfortably.    Baseline 5/10 R and L foot/toe(s) pain at most for the past month (02/27/2021); 2/10 L foot and 0/10 R foot pain at most for the past 7 days (04/03/2021)    Time 8    Period Weeks    Status Achieved    Target Date 04/27/21      PT LONG TERM GOAL #2   Title Pt will report being able to ambulate at the treadmill for 30 minutes with less pain in B feet to promote fitness.    Baseline Increased pain with gait at treadmill for 20-30 minutes (02/27/2021); Able to walk on treadmill 2.2 MPH for 2 min 3 times with minimal symptoms (04/03/2021)    Time 8    Period Weeks    Status Partially Met    Target Date 04/27/21      PT LONG TERM GOAL #3   Title Pt will improve his FOTO score by at least 10 points as a demonstration of improved function.    Baseline Lower leg FOTO 58 (02/27/2021); 61 (04/03/2021)    Time 8    Period  Weeks    Status Partially Met    Target Date 04/27/21                   Plan - 04/11/21 1131     Clinical Impression Statement Pt  currently able to ambulate up to 25 minutes on the treadmill now without reports of pain. Pt making very good progress towards goals. Continued working on decreasing fascial restrictions as well as improving toe extension to promote more comfortable foot flat and push-off phase during gait. Pt tolerated session well without aggravation of symptoms. Pt will benefit from continued skilled physical therapy services to decrease pain, improve strength and function.    Personal Factors and Comorbidities Age;Comorbidity 3+;Past/Current Experience;Time since onset of injury/illness/exacerbation    Comorbidities Major depressive disorder, RA, mild neurocognitive disorder    Examination-Activity Limitations Locomotion Level;Stairs    Stability/Clinical Decision Making Stable/Uncomplicated    Rehab Potential Fair    PT Frequency 2x / week    PT Duration 8 weeks    PT Treatment/Interventions Manual techniques;Therapeutic exercise;Therapeutic activities;Electrical Stimulation;Iontophoresis 1m/ml Dexamethasone;Neuromuscular re-education;Patient/family education    PT Next Visit Plan toe extension, manual technies, strengthening, modalties PRN    Consulted and Agree with Plan of Care Patient             Patient will benefit from skilled therapeutic intervention in order to improve the following deficits and impairments:  Pain, Improper body mechanics, Difficulty walking, Decreased range of motion  Visit Diagnosis: Pain in left foot     Problem List Patient Active Problem List   Diagnosis Date Noted   Mild neurocognitive disorder 09/14/2020   Generalized anxiety disorder    Coarse tremors 12/29/2019   Diplopia 12/29/2019   Esotropia, intermittent 12/29/2019   Regular astigmatism of both eyes 12/29/2019   High risk medication use 05/29/2018   History  of IBS 05/29/2018   Major depressive disorder in remission 07/15/2017   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 [place] [address]Phone: [phone]  Fax:  [phone] Name: JCOBEY RAINERIMRN: [id number] of Birth: 105-29-1952

## 2021-04-13 ENCOUNTER — Ambulatory Visit: Payer: Medicare Other

## 2021-04-13 DIAGNOSIS — M79672 Pain in left foot: Secondary | ICD-10-CM | POA: Diagnosis not present

## 2021-04-13 DIAGNOSIS — M79671 Pain in right foot: Secondary | ICD-10-CM | POA: Diagnosis not present

## 2021-04-13 DIAGNOSIS — M79642 Pain in left hand: Secondary | ICD-10-CM | POA: Diagnosis not present

## 2021-04-13 DIAGNOSIS — M25642 Stiffness of left hand, not elsewhere classified: Secondary | ICD-10-CM | POA: Diagnosis not present

## 2021-04-13 NOTE — Patient Instructions (Signed)
Pt was recommended to stretch his toes after his treadmill walks to maintain ROM. Pt verbalized understanding.

## 2021-04-13 NOTE — Therapy (Signed)
[facility] [address] [place], [address] Phone: [phone]   Fax:  [phone]  Physical Therapy Treatment  Patient Details  Name: LYTLE MALBURG MRN: [id number] Date of Birth: 11/16/1950 Referring Provider (PT): Bo Merino, MD   Encounter Date: 04/13/2021   PT End of Session - 04/13/21 1102     Visit Number 13    Number of Visits 17    Date for PT Re-Evaluation 04/27/21    Authorization Type Traditional Medicare    Authorization Time Period 02/27/21-04/27/21    PT Start Time 1102    PT Stop Time 1132    PT Time Calculation (min) 30 min    Activity Tolerance Patient tolerated treatment well    Behavior During Therapy [facility] for tasks assessed/performed             Past Medical History:  Diagnosis Date   Allergic-infective asthma 12/24/2007   FENO- 08/07/16- 25 Office spirometry 08/07/16- WNL, FEV1 3.59/ 110%, R 0.81    Asthmatic bronchitis with acute exacerbation 10/15/2016   Coarse tremors 12/29/2019   Upper extremities, bilateral   Diplopia 12/29/2019   Generalized anxiety disorder    GERD (gastroesophageal reflux disease) 12/24/2007   Major depressive disorder in remission 07/15/2017   Mild neurocognitive disorder 09/14/2020   Polyp of nasal cavity 12/24/2007   Regular astigmatism of both eyes 12/29/2019   Rheumatoid arthritis of multiple sites without rheumatoid factor 09/18/2010   Seasonal and perennial allergic rhinitis 12/24/2007   Sinusitis, chronic 12/24/2007   Recurrent acute maxillary and frontal sinusitis     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   Subjective Assessment  - 04/13/21 1104     Subjective L foot is fine, no pain or discomfort. Did 20 minutes on the treadmill today and foot did not bother him. 1-2/10 L foot pain at most for the psat 7 days.    Pertinent History RA, B hand and foot pain. Pt having tremmors. Difficulty coordinating his L hand due to tremmors and arthritis. Has B hand arthritis at all his knuckles/mcp joints. B foot pain L > R. Body feels like it is trying to make his feet go into B PF. Foot pain located at the MTP joints bilaterally L > R. Pt started having tremmors when taking abylify. B feet started bothering him when pt started having tremmors which was about 2 months ago around March 2022.    Currently in Pain? No/denies                                       PT Education - 04/13/21 1138     Education Details ther-ex, manual therapy    Person(s) Educated Patient    Methods Explanation;Demonstration;Tactile cues;Verbal cues    Comprehension Returned demonstration;Verbalized understanding           Objective     No latex allergies Blood pressure is controlled   Pain location: toe extensor tendons as well as PIP (proximal interphalangeal) joints. Pt states  having hammer toes for about 2 years    Last scheduled appointment: 04/20/2021  Medbridge Access Code [email or web address]     Manual therapy Seated toe extension stretch 2-4th digits with gentle joint distractions  Seated STM L calf targeting the toe flexor muscles Seated STM plantar foot to decrease muscle tension and fascial restritions   Therapeutic exercise  Seated toe extension stretch L foot 10x10 seconds Pt performing rest of his stretches with HEP afterwards     Improved exercise technique, movement at target joints, use of target muscles after mod verbal, visual, tactile cues.           Response to treatment Pt tolerated session well without aggravation of symptoms.        Clinical impression Pt continues to do well with PT  with reports of no pain at start of session or during treadmill walks for at least 20 minutes. Continued working on improving toe extension to promote progress. Pt tolerated session well without aggravation of symptoms. Pt will benefit from continued skilled physical therapy services to decrease pain, improve strength and function.   PT Short Term Goals - 04/03/21 1419       PT SHORT TERM GOAL #1   Title Pt will be independent with his initial HEP to improve toe extension, decrease pain, improve ability to ambulate more comfortably.    Baseline Does his HEP, no questions (04/03/2021)    Time 3    Period Weeks    Status Achieved    Target Date 03/23/21               PT Long Term Goals - 04/03/21 1420       PT LONG TERM GOAL #1   Title Pt will have a decrease in L and R foot/toe(s) pain to 2/10 or less at most to promote ability to ambulate longer distances, negotiate inclines and stairs more comfortably.    Baseline 5/10 R and L foot/toe(s) pain at most for the past month (02/27/2021); 2/10 L foot and 0/10 R foot pain at most for the past 7 days (04/03/2021)    Time 8    Period Weeks    Status Achieved    Target Date 04/27/21      PT LONG TERM GOAL #2   Title Pt will report being able to ambulate at the treadmill for 30 minutes with less pain in B feet to promote fitness.    Baseline Increased pain with gait at treadmill for 20-30 minutes (02/27/2021); Able to walk on treadmill 2.2 MPH for 2 min 3 times with minimal symptoms (04/03/2021)    Time 8    Period Weeks    Status Partially Met    Target Date 04/27/21      PT LONG TERM GOAL #3   Title Pt will improve his FOTO score by at least 10 points as a demonstration of improved function.    Baseline Lower leg FOTO 58 (02/27/2021); 61 (04/03/2021)    Time 8    Period Weeks    Status Partially Met    Target Date 04/27/21                   Plan - 04/13/21 1132     Clinical Impression Statement Pt continues to do well  with PT with reports of no pain at start of session or during treadmill walks for at least 20 minutes. Continued working on improving toe extension to promote progress. Pt tolerated session well without  aggravation of symptoms. Pt will benefit from continued skilled physical therapy services to decrease pain, improve strength and function.    Personal Factors and Comorbidities Age;Comorbidity 3+;Past/Current Experience;Time since onset of injury/illness/exacerbation    Comorbidities Major depressive disorder, RA, mild neurocognitive disorder    Examination-Activity Limitations Locomotion Level;Stairs    Stability/Clinical Decision Making Stable/Uncomplicated    Rehab Potential Fair    PT Frequency 2x / week    PT Duration 8 weeks    PT Treatment/Interventions Manual techniques;Therapeutic exercise;Therapeutic activities;Electrical Stimulation;Iontophoresis 24m/ml Dexamethasone;Neuromuscular re-education;Patient/family education    PT Next Visit Plan toe extension, manual technies, strengthening, modalties PRN    Consulted and Agree with Plan of Care Patient             Patient will benefit from skilled therapeutic intervention in order to improve the following deficits and impairments:  Pain, Improper body mechanics, Difficulty walking, Decreased range of motion  Visit Diagnosis: Pain in left foot     Problem List Patient Active Problem List   Diagnosis Date Noted   Mild neurocognitive disorder 09/14/2020   Generalized anxiety disorder    Coarse tremors 12/29/2019   Diplopia 12/29/2019   Esotropia, intermittent 12/29/2019   Regular astigmatism of both eyes 12/29/2019   High risk medication use 05/29/2018   History of IBS 05/29/2018   Major depressive disorder in remission 07/15/2017   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 [place] [address]Phone: [phone]  Fax:  [phone] Name: JSTORMY SABOLMRN: [id number] of Birth: 1Dec 21, 1952

## 2021-04-14 DIAGNOSIS — J4520 Mild intermittent asthma, uncomplicated: Secondary | ICD-10-CM | POA: Diagnosis not present

## 2021-04-14 DIAGNOSIS — K219 Gastro-esophageal reflux disease without esophagitis: Secondary | ICD-10-CM | POA: Diagnosis not present

## 2021-04-14 DIAGNOSIS — I10 Essential (primary) hypertension: Secondary | ICD-10-CM | POA: Diagnosis not present

## 2021-04-14 DIAGNOSIS — J45909 Unspecified asthma, uncomplicated: Secondary | ICD-10-CM | POA: Diagnosis not present

## 2021-04-14 DIAGNOSIS — M069 Rheumatoid arthritis, unspecified: Secondary | ICD-10-CM | POA: Diagnosis not present

## 2021-04-14 DIAGNOSIS — E785 Hyperlipidemia, unspecified: Secondary | ICD-10-CM | POA: Diagnosis not present

## 2021-04-14 DIAGNOSIS — F319 Bipolar disorder, unspecified: Secondary | ICD-10-CM | POA: Diagnosis not present

## 2021-04-14 DIAGNOSIS — F331 Major depressive disorder, recurrent, moderate: Secondary | ICD-10-CM | POA: Diagnosis not present

## 2021-04-18 ENCOUNTER — Ambulatory Visit: Payer: Medicare Other

## 2021-04-18 ENCOUNTER — Ambulatory Visit: Payer: Medicare Other | Admitting: Occupational Therapy

## 2021-04-18 DIAGNOSIS — M25642 Stiffness of left hand, not elsewhere classified: Secondary | ICD-10-CM

## 2021-04-18 DIAGNOSIS — M79671 Pain in right foot: Secondary | ICD-10-CM | POA: Diagnosis not present

## 2021-04-18 DIAGNOSIS — M79672 Pain in left foot: Secondary | ICD-10-CM

## 2021-04-18 DIAGNOSIS — M79642 Pain in left hand: Secondary | ICD-10-CM | POA: Diagnosis not present

## 2021-04-18 NOTE — Therapy (Signed)
[facility] [address] [place], [address] Phone: [phone]   Fax:  [phone]  Occupational Therapy Treatment  Patient Details  Name: [name] [name] MRN: [id number] Date of Birth: [birth year] Referring Provider (OT): Deveshwar   Encounter Date: 04/18/2021   OT End of Session - 04/18/21 0924     Visit Number 4    Number of Visits 4    Date for OT Re-Evaluation 04/18/21    OT Start Time 0848    OT Stop Time 2836    OT Time Calculation (min) 26 min    Activity Tolerance Patient tolerated treatment well    Behavior During Therapy [facility] for tasks assessed/performed             Past Medical History:  Diagnosis Date   Allergic-infective asthma 12/24/2007   FENO- 08/07/16- 25 Office spirometry 08/07/16- WNL, FEV1 3.59/ 110%, R 0.81    Asthmatic bronchitis with acute exacerbation 10/15/2016   Coarse tremors 12/29/2019   Upper extremities, bilateral   Diplopia 12/29/2019   Generalized anxiety disorder    GERD (gastroesophageal reflux disease) 12/24/2007   Major depressive disorder in remission 07/15/2017   Mild neurocognitive disorder 09/14/2020   Polyp of nasal cavity 12/24/2007   Regular astigmatism of both eyes 12/29/2019   Rheumatoid arthritis of multiple sites without rheumatoid factor 09/18/2010   Seasonal and perennial allergic rhinitis 12/24/2007   Sinusitis, chronic 12/24/2007   Recurrent acute maxillary and frontal sinusitis     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   Subjective Assessment - 04/18/21 0921     Subjective  Doing better - less stiffness -and doing my exercises about  every other day    Pertinent History Pt with long history of OA and RA in multiple joints - and pt report increase weakness and coordination in L hand- refer by Rheumathalogy to OT    Patient Stated Goals Want to be able to use my L hand better and with less pain    Currently in Pain? No/denies                [facility] OT Assessment - 04/18/21 0001       AROM   Right Wrist Extension 65 Degrees    Right Wrist Flexion 75 Degrees      Strength   Right Hand Grip (lbs) 72    Right Hand Lateral Pinch 16 lbs    Right Hand 3 Point Pinch 16 lbs    Left Hand Grip (lbs) 80    Left Hand Lateral Pinch 16 lbs    Left Hand 3 Point Pinch 14 lbs      Left Hand AROM   L Index  MCP 0-90 --   -40   L Long  MCP 0-90 --   -20   L Ring  MCP 0-90 --   -25   L Little  MCP 0-90 --   -40               Flexion of digits WNL - in bilateral hands  No hyper extention of PIP's  No pain this date - and stiffness much better  And fibrosis in palm improved greatly -and can do PROM with palm flat on table - no pain      Rolling over foam roller - palm and digits into extention and then relax into hyper extention Gentle table slides on towel for composite extention - needed review this date  20 reps Cont with HEP RD of digits- can put finger tips on papers to slide 5 reps - Pain free   Review and reinforce again for pt : Ed on joint protection - use larger joints, enlarge grips And avoid ulnar deviation of digits and lat grip ( 90 degrees grip with MC's ) Pt to cont with HEP               OT Education - 04/18/21 0924     Education Details progress and discharge instructions    Person(s) Educated Patient    Methods Explanation;Tactile cues;Verbal cues;Handout    Comprehension Verbal cues required;Returned demonstration;Verbalized understanding                 OT Long Term Goals - 04/18/21 0933       OT LONG TERM GOAL #1   Title Pt to be independent in HEP to increase  AROM  and decrease pain with extention of digits    Baseline pain extention stretch of digits- extention of MC's -30 to -50 NOW extention -20 to -30 now -and PROM palm flat on table    Status Achieved      OT LONG TERM GOAL #2   Title Pt to verbalize 3 joint protection and AE to increase ease of ADL's and IADL's and decrease pressure or stress on joints    Status Achieved      OT LONG TERM GOAL #3   Title Assess need for splints for extention of digits    Baseline no splint needed    Status Achieved                   Plan - 04/18/21 0925     Clinical Impression Statement Pt refer to OT for RA and OA in multiple joints - pt refer to OT with increase symptoms in L hand - pt show decrease extentoin of digits moslty out of MC's at eval - flexion , grip and prehension strength WNL - pt did had CTR in past with scar , beginning changes of dupuytrens, and ulnar deviation out of MC's - at risk for subluxation - pt  showed  great progressin digits extention and less tightness in the palm with no pain - has full PROM extention now and wrist extention 70 degrees- can weightbear with no issues- pt was able to maintain progress the last month and discharge with HEP    OT Occupational Profile and History Problem Focused Assessment - Including review of records relating to presenting problem    Occupational performance deficits (Please refer to evaluation for details): Leisure;IADL's;ADL's;Play    Body Structure / Function / Physical Skills ADL;FMC;Coordination;Flexibility;Dexterity;Pain;UE functional use;IADL;ROM    Rehab Potential Good    Clinical Decision Making Limited treatment options, no task modification necessary    Comorbidities Affecting Occupational Performance: None    Modification or Assistance to Complete Evaluation  No modification of tasks or assist necessary to complete eval    OT Treatment/Interventions Self-care/ADL training;DME and/or AE instruction;Splinting;Therapeutic  exercise;Manual Therapy;Passive range of motion    Consulted and Agree with Plan of Care Patient  Patient will benefit from skilled therapeutic intervention in order to improve the following deficits and impairments:   Body Structure / Function / Physical Skills: ADL, FMC, Coordination, Flexibility, Dexterity, Pain, UE functional use, IADL, ROM       Visit Diagnosis: Pain in left foot - Plan: Ot plan of care cert/re-cert  Stiffness of left hand, not elsewhere classified - Plan: Ot plan of care cert/re-cert  Pain in left hand - Plan: Ot plan of care cert/re-cert  Pain in right foot - Plan: Ot plan of care cert/re-cert    Problem List Patient Active Problem List   Diagnosis Date Noted   Mild neurocognitive disorder 09/14/2020   Generalized anxiety disorder    Coarse tremors 12/29/2019   Diplopia 12/29/2019   Esotropia, intermittent 12/29/2019   Regular astigmatism of both eyes 12/29/2019   High risk medication use 05/29/2018   History of IBS 05/29/2018   Major depressive disorder in remission 07/15/2017   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, [place], [address] Phone: [phone]   Fax:  [phone]  Name: [name] [name] MRN: [id number] Date of Birth: 1951-07-26

## 2021-04-18 NOTE — Therapy (Signed)
[facility] [address], [place], [address] Phone: [phone]   Fax:  [phone]  Physical Therapy Treatment  Patient Details  Name: [name] [name] MRN: [id number] Date of Birth: 03-Apr-1951 Referring Provider (PT): Bo Merino, MD   Encounter Date: 04/18/2021   PT End of Session - 04/18/21 0922     Visit Number 14    Number of Visits 17    Date for PT Re-Evaluation 04/27/21    Authorization Type Traditional Medicare    Authorization Time Period 02/27/21-04/27/21    PT Start Time 0922    PT Stop Time 0955    PT Time Calculation (min) 33 min    Activity Tolerance Patient tolerated treatment well    Behavior During Therapy [facility] for tasks assessed/performed             Past Medical History:  Diagnosis Date   Allergic-infective asthma 12/24/2007   FENO- 08/07/16- 25 Office spirometry 08/07/16- WNL, FEV1 3.59/ 110%, R 0.81    Asthmatic bronchitis with acute exacerbation 10/15/2016   Coarse tremors 12/29/2019   Upper extremities, bilateral   Diplopia 12/29/2019   Generalized anxiety disorder    GERD (gastroesophageal reflux disease) 12/24/2007   Major depressive disorder in remission 07/15/2017   Mild neurocognitive disorder 09/14/2020   Polyp of nasal cavity 12/24/2007   Regular astigmatism of both eyes 12/29/2019   Rheumatoid arthritis of multiple sites without rheumatoid factor 09/18/2010   Seasonal and perennial allergic rhinitis 12/24/2007   Sinusitis, chronic 12/24/2007   Recurrent acute maxillary and frontal sinusitis     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   Subjective  Assessment - 04/18/21 0924     Subjective L foot is doing fine, no pain currently. Feels like he can graduate PT next visit.    Pertinent History RA, B hand and foot pain. Pt having tremmors. Difficulty coordinating his L hand due to tremmors and arthritis. Has B hand arthritis at all his knuckles/mcp joints. B foot pain L > R. Body feels like it is trying to make his feet go into B PF. Foot pain located at the MTP joints bilaterally L > R. Pt started having tremmors when taking abylify. B feet started bothering him when pt started having tremmors which was about 2 months ago around March 2022.    Currently in Pain? No/denies                                       PT Education - 04/18/21 0955     Education Details plan of care    Person(s) Educated Patient    Methods Explanation    Comprehension Verbalized understanding            Objective     No latex allergies Blood pressure is controlled   Pain location: toe extensor tendons as well as PIP (proximal interphalangeal) joints. Pt states having hammer toes for about 2 years     Last scheduled appointment: 04/20/2021   Medbridge  Access Code [email or web address]     Manual therapy  Seated toe extension stretch 2-4th digits with gentle joint distractions  Seated STM L calf targeting the toe flexor muscles Seated STM plantar foot to decrease muscle tension and fascial restrictions   Therapeutic exercise  Seated toe extension stretch L foot 10x10 seconds Pt performing rest of his stretches with HEP afterwards      Improved exercise technique, movement at target joints, use of target muscles after mod verbal, visual, tactile cues.           Response to treatment Pt tolerated session well without aggravation of symptoms.        Clinical impression  Continued working on improving toe extension to promote progress and ability to ambulate longer distances more comfortably. Potential graduation to HEP next  visit secondary to good progress. Pt tolerated session well without aggravation of symptoms. Pt will benefit from continued skilled physical therapy services to decrease pain, improve strength and function.       PT Short Term Goals - 04/03/21 1419       PT SHORT TERM GOAL #1   Title Pt will be independent with his initial HEP to improve toe extension, decrease pain, improve ability to ambulate more comfortably.    Baseline Does his HEP, no questions (04/03/2021)    Time 3    Period Weeks    Status Achieved    Target Date 03/23/21               PT Long Term Goals - 04/03/21 1420       PT LONG TERM GOAL #1   Title Pt will have a decrease in L and R foot/toe(s) pain to 2/10 or less at most to promote ability to ambulate longer distances, negotiate inclines and stairs more comfortably.    Baseline 5/10 R and L foot/toe(s) pain at most for the past month (02/27/2021); 2/10 L foot and 0/10 R foot pain at most for the past 7 days (04/03/2021)    Time 8    Period Weeks    Status Achieved    Target Date 04/27/21      PT LONG TERM GOAL #2   Title Pt will report being able to ambulate at the treadmill for 30 minutes with less pain in B feet to promote fitness.    Baseline Increased pain with gait at treadmill for 20-30 minutes (02/27/2021); Able to walk on treadmill 2.2 MPH for 2 min 3 times with minimal symptoms (04/03/2021)    Time 8    Period Weeks    Status Partially Met    Target Date 04/27/21      PT LONG TERM GOAL #3   Title Pt will improve his FOTO score by at least 10 points as a demonstration of improved function.    Baseline Lower leg FOTO 58 (02/27/2021); 61 (04/03/2021)    Time 8    Period Weeks    Status Partially Met    Target Date 04/27/21                   Plan - 04/18/21 0956     Clinical Impression Statement Continued working on improving toe extension to promote progress and ability to ambulate longer distances more comfortably. Potential graduation  to HEP next visit secondary to good progress. Pt tolerated session well without aggravation of symptoms. Pt will benefit from continued skilled physical therapy services to decrease pain, improve strength and function.    Personal  Factors and Comorbidities Age;Comorbidity 3+;Past/Current Experience;Time since onset of injury/illness/exacerbation    Comorbidities Major depressive disorder, RA, mild neurocognitive disorder    Examination-Activity Limitations Locomotion Level;Stairs    Stability/Clinical Decision Making Stable/Uncomplicated    Rehab Potential Fair    PT Frequency 2x / week    PT Duration 8 weeks    PT Treatment/Interventions Manual techniques;Therapeutic exercise;Therapeutic activities;Electrical Stimulation;Iontophoresis 35m/ml Dexamethasone;Neuromuscular re-education;Patient/family education    PT Next Visit Plan toe extension, manual technies, strengthening, modalties PRN    Consulted and Agree with Plan of Care Patient             Patient will benefit from skilled therapeutic intervention in order to improve the following deficits and impairments:  Pain, Improper body mechanics, Difficulty walking, Decreased range of motion  Visit Diagnosis: Pain in left foot     Problem List Patient Active Problem List   Diagnosis Date Noted   Mild neurocognitive disorder 09/14/2020   Generalized anxiety disorder    Coarse tremors 12/29/2019   Diplopia 12/29/2019   Esotropia, intermittent 12/29/2019   Regular astigmatism of both eyes 12/29/2019   High risk medication use 05/29/2018   History of IBS 05/29/2018   Major depressive disorder in remission 07/15/2017   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 [place] [address]Phone: [phone]  Fax:  [phone] Name: [name] MERRYMRN: [id number] of Birth: [birth year]

## 2021-04-20 ENCOUNTER — Ambulatory Visit: Payer: Medicare Other

## 2021-04-20 DIAGNOSIS — M79671 Pain in right foot: Secondary | ICD-10-CM

## 2021-04-20 DIAGNOSIS — M79642 Pain in left hand: Secondary | ICD-10-CM | POA: Diagnosis not present

## 2021-04-20 DIAGNOSIS — M25642 Stiffness of left hand, not elsewhere classified: Secondary | ICD-10-CM | POA: Diagnosis not present

## 2021-04-20 DIAGNOSIS — M79672 Pain in left foot: Secondary | ICD-10-CM

## 2021-04-20 NOTE — Therapy (Signed)
[facility] [address] [place], [address] Phone: [phone]   Fax:  [phone]  Physical Therapy Treatment And Discharge Summary   Patient Details  Name: [name] [name] MRN: [id number] Date of Birth: 05/22/1951 Referring Provider (PT): Bo Merino, MD   Encounter Date: 04/20/2021   PT End of Session - 04/20/21 1420     Visit Number 15    Number of Visits 17    Date for PT Re-Evaluation 04/27/21    Authorization Type Traditional Medicare    Authorization Time Period 02/27/21-04/27/21    PT Start Time 1420    PT Stop Time 1452    PT Time Calculation (min) 32 min    Activity Tolerance Patient tolerated treatment well    Behavior During Therapy [facility] for tasks assessed/performed             Past Medical History:  Diagnosis Date   Allergic-infective asthma 12/24/2007   FENO- 08/07/16- 25 Office spirometry 08/07/16- WNL, FEV1 3.59/ 110%, R 0.81    Asthmatic bronchitis with acute exacerbation 10/15/2016   Coarse tremors 12/29/2019   Upper extremities, bilateral   Diplopia 12/29/2019   Generalized anxiety disorder    GERD (gastroesophageal reflux disease) 12/24/2007   Major depressive disorder in remission 07/15/2017   Mild neurocognitive disorder 09/14/2020   Polyp of nasal cavity 12/24/2007   Regular astigmatism of both eyes 12/29/2019   Rheumatoid arthritis of multiple sites without rheumatoid factor 09/18/2010   Seasonal and perennial allergic rhinitis 12/24/2007   Sinusitis, chronic 12/24/2007   Recurrent acute maxillary and frontal sinusitis     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   Subjective Assessment - 04/20/21 1422     Subjective L and R feet are doing well. No pain currently. 1/10 L foot pain at most for the past 7 days.    Pertinent History RA, B hand and foot pain. Pt having tremmors. Difficulty coordinating his L hand due to tremmors and arthritis. Has B hand arthritis at all his knuckles/mcp joints. B foot pain L > R. Body feels like it is trying to make his feet go into B PF. Foot pain located at the MTP joints bilaterally L > R. Pt started having tremmors when taking abylify. B feet started bothering him when pt started having tremmors which was about 2 months ago around March 2022.                                       PT Education - 04/20/21 1503     Education Details Manual therapy    Person(s) Educated Patient    Methods Explanation    Comprehension Verbalized understanding           Objective     No latex allergies Blood pressure is controlled   Pain location: toe extensor tendons as well as PIP (proximal interphalangeal) joints. Pt states having hammer toes for about 2 years     Last scheduled appointment: 04/20/2021   Medbridge  Access Code [email or web address]     Manual therapy  Seated toe extension stretch 2-4th digits with gentle joint distractions  Seated STM L calf targeting the toe flexor muscles Seated STM plantar foot to decrease muscle tension and fascial restrictions   Therapeutic exercise  Seated toe extension stretch L foot 10x10 seconds Pt performing rest of his stretches with HEP afterwards      Improved exercise technique, movement at target joints, use of target muscles after mod verbal, visual, tactile cues.           Response to treatment Pt tolerated session well without aggravation of symptoms.        Clinical impression Pt demonstrates significant decrease in B foot pain, as well as improved ability to ambulate more comfortably and some improvement in function. Pt has made  good progress with PT towards goals. Skilled physical therapy services discharged with pt continuing with his HEP.                PT Short Term Goals - 04/03/21 1419       PT SHORT TERM GOAL #1   Title Pt will be independent with his initial HEP to improve toe extension, decrease pain, improve ability to ambulate more comfortably.    Baseline Does his HEP, no questions (04/03/2021)    Time 3    Period Weeks    Status Achieved    Target Date 03/23/21               PT Long Term Goals - 04/20/21 1423       PT LONG TERM GOAL #1   Title Pt will have a decrease in L and R foot/toe(s) pain to 2/10 or less at most to promote ability to ambulate longer distances, negotiate inclines and stairs more comfortably.    Baseline 5/10 R and L foot/toe(s) pain at most for the past month (02/27/2021); 2/10 L foot and 0/10 R foot pain at most for the past 7 days (04/03/2021); 1/10 L foot (04/20/2021)    Time 8    Period Weeks    Status Achieved      PT LONG TERM GOAL #2   Title Pt will report being able to ambulate at the treadmill for 30 minutes with less pain in B feet to promote fitness.    Baseline Increased pain with gait at treadmill for 20-30 minutes (02/27/2021); Able to walk on treadmill 2.2 MPH for 20 min 3 times with minimal symptoms (04/03/2021), (04/20/2021)    Time 8    Period Weeks    Status Partially Met    Target Date 04/27/21      PT LONG TERM GOAL #3   Title Pt will improve his FOTO score by at least 10 points as a demonstration of improved function.    Baseline Lower leg FOTO 58 (02/27/2021); 61 (04/03/2021), (04/20/2021)    Time 8    Period Weeks    Status Partially Met    Target Date 04/27/21                   Plan - 04/20/21 1456     Clinical Impression Statement Pt demonstrates significant decrease in B foot pain, as well as improved ability to ambulate more comfortably and some improvement in function. Pt has made good progress with PT towards goals.  Skilled physical therapy services discharged with pt continuing with his HEP.    Personal Factors and Comorbidities Age;Comorbidity 3+;Past/Current Experience;Time since onset of injury/illness/exacerbation  Comorbidities Major depressive disorder, RA, mild neurocognitive disorder    Stability/Clinical Decision Making Stable/Uncomplicated    Clinical Decision Making Low    PT Treatment/Interventions Manual techniques;Therapeutic exercise;Therapeutic activities;Neuromuscular re-education;Patient/family education    PT Next Visit Plan Continue progress with HEP    PT Home Exercise Plan Medbridge Access Code [email or web address]    Consulted and Agree with Plan of Care Patient             Patient will benefit from skilled therapeutic intervention in order to improve the following deficits and impairments:     Visit Diagnosis: Pain in left foot  Pain in right foot     Problem List Patient Active Problem List   Diagnosis Date Noted   Mild neurocognitive disorder 09/14/2020   Generalized anxiety disorder    Coarse tremors 12/29/2019   Diplopia 12/29/2019   Esotropia, intermittent 12/29/2019   Regular astigmatism of both eyes 12/29/2019   High risk medication use 05/29/2018   History of IBS 05/29/2018   Major depressive disorder in remission 07/15/2017   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  Joneen Boers PT, DPT   04/20/2021, 3:03 PM  [facility] [address], [place], [address] Phone:  [phone]   Fax:  [phone]  Name: [name] [name] MRN: [id number] Date of Birth: Dec 12, 1950

## 2021-04-25 NOTE — Progress Notes (Signed)
Office Visit Note  Patient: [name] [name]             Date of Birth: [birth year]           MRN: [id number]             PCP: Wenda Low, MD Referring: Wenda Low, MD Visit Date: 05/09/2021 Occupation: @GUAROCC @  Subjective:  Medication management.   History of Present Illness: [name] [name] is a 70 y.o. male with a history of rheumatoid arthritis and osteoarthritis overlap.  He states he went for physical therapy after the last visit which helped him a lot with the hand exercises and feet exercises.  He is feeling overall better.  He has been using treadmill every day and also has been lifting weights.  He denies any increased joint swelling.  He has been tolerating Orencia infusions every 6 weeks.  Activities of Daily Living:  Patient reports morning stiffness for 0 minutes.   Patient Denies nocturnal pain.  Difficulty dressing/grooming: Denies Difficulty climbing stairs: Denies Difficulty getting out of chair: Reports Difficulty using hands for taps, buttons, cutlery, and/or writing: Reports  Review of Systems  Constitutional:  Negative for fatigue.  HENT:  Negative for mouth sores, mouth dryness and nose dryness.   Eyes:  Negative for pain, itching and dryness.  Respiratory:  Negative for shortness of breath and difficulty breathing.   Cardiovascular:  Negative for chest pain and palpitations.  Gastrointestinal:  Negative for blood in stool, constipation and diarrhea.  Endocrine: Negative for increased urination.  Genitourinary:  Positive for difficulty urinating.  Musculoskeletal:  Negative for joint pain, joint pain, joint swelling, myalgias, morning stiffness, muscle tenderness and myalgias.  Skin:  Negative for color change, rash and redness.  Allergic/Immunologic: Positive for susceptible to infections.  Neurological:  Negative for dizziness, numbness, headaches and weakness.  Hematological:  Negative for bruising/bleeding tendency.  Psychiatric/Behavioral:   Negative for sleep disturbance.    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   Asthmatic bronchitis with acute exacerbation 10/15/2016   Herpes zoster 08/01/2013   Rheumatoid arthritis of multiple sites without rheumatoid factor ([place]) 09/18/2010   Polyp of nasal cavity 12/24/2007   Sinusitis, chronic 12/24/2007   Seasonal and perennial allergic rhinitis 12/24/2007   Allergic-infective asthma 12/24/2007   GERD (gastroesophageal reflux disease) 12/24/2007    Past Medical History:  Diagnosis Date   Allergic-infective asthma 12/24/2007   FENO- 08/07/16- 25 Office spirometry 08/07/16- WNL, FEV1 3.59/ 110%, R 0.81    Asthmatic bronchitis with acute exacerbation 10/15/2016   Coarse tremors 12/29/2019   Upper extremities, bilateral   Diplopia 12/29/2019   Generalized anxiety disorder    GERD (gastroesophageal reflux disease) 12/24/2007   Major depressive disorder in remission 07/15/2017   Mild neurocognitive disorder 09/14/2020   Polyp of nasal cavity 12/24/2007   Regular astigmatism of both eyes 12/29/2019   Rheumatoid arthritis of multiple sites without rheumatoid factor 09/18/2010   Seasonal and perennial allergic rhinitis 12/24/2007   Sinusitis, chronic 12/24/2007   Recurrent acute maxillary and frontal sinusitis     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  Family History  Problem Relation Age of Onset   Multiple sclerosis Father    Breast cancer Mother    Breast cancer Sister    Alcoholism Child        now sober   Past Surgical History:  Procedure  Laterality Date   CARPAL TUNNEL RELEASE Left    extensive sinus surgery with ablation of the frontal sinuses     HERNIA REPAIR     MOUTH SURGERY  11/2019   nasal polypectomies     TOOTH EXTRACTION     Social History   Social History Narrative   Not on file   Immunization History  Administered Date(s) Administered   Influenza Split 07/04/2009, 08/02/2011, 07/08/2012, 09/07/2012, 07/06/2013, 07/14/2015, 07/08/2016, 07/04/2020   Influenza, High Dose Seasonal PF 06/19/2017, 06/13/2018, 06/18/2019, 08/24/2019   Influenza,inj,Quad PF,6+ Mos 07/15/2014, 06/19/2017   Influenza-Unspecified 07/25/2016   PFIZER(Purple Top)SARS-COV-2 Vaccination 11/20/2019, 12/15/2019, 06/17/2020   Pneumococcal Conjugate-13 05/26/2018   Pneumococcal Polysaccharide-23 05/27/2006, 07/15/2014   Td 03/06/2007   Tdap 08/23/2012   Zoster Recombinat (Shingrix) 05/23/2018, 08/21/2018   Zoster, Live 04/24/2016, 05/20/2018, 07/30/2018     Objective: Vital Signs: BP 122/80 (BP Location: Left Arm, Patient Position: Sitting, Cuff Size: Normal)   Pulse (!) 47   Ht 5\' 8"  (1.727 m)   Wt 165 lb 9.6 oz (75.1 kg)   BMI 25.18 kg/m    Physical Exam Vitals and nursing note reviewed.  Constitutional:      Appearance: He is well-developed.  HENT:     Head: Normocephalic and atraumatic.  Eyes:     Conjunctiva/sclera: Conjunctivae normal.     Pupils: Pupils are equal, round, and reactive to light.  Cardiovascular:     Rate and Rhythm: Normal rate and regular rhythm.     Heart sounds: Normal heart sounds.  Pulmonary:     Effort: Pulmonary effort is normal.     Breath sounds: Normal breath sounds.  Abdominal:     General: Bowel sounds are normal.     Palpations: Abdomen is soft.  Musculoskeletal:     Cervical back: Normal range of motion and neck supple.  Skin:    General: Skin is warm and dry.     Capillary Refill: Capillary refill takes less than 2 seconds.  Neurological:     Mental Status: He is alert and  oriented to person, place, and time.  Psychiatric:        Behavior: Behavior normal.     Musculoskeletal Exam: He had limited left rotation.  Shoulder joints, elbow joints, wrist joints with good range of motion.  He had bilateral PIP and DIP thickening.  He had ulnar deviation with synovial thickening but no synovitis.  He had incomplete extension of his left hand MCPs and PIP joints due to arthritis.  Hip joints, knee joints, ankles, MTPs and PIPs with good range of motion with no synovitis.  CDAI Exam: CDAI Score: 0.2  Patient Global: 1 mm; Provider Global: 1 mm Swollen: 0 ; Tender: 0  Joint Exam 05/09/2021   No joint exam has been documented for this visit   There is currently no information documented on the homunculus. Go to the Rheumatology activity and complete the homunculus joint exam.  Investigation: No additional findings.  Imaging: No results found.  Recent Labs: Lab Results  Component Value Date   WBC 5.5 04/06/2021   HGB 13.5 04/06/2021   PLT 314 04/06/2021   NA 139 04/06/2021   K 4.4 04/06/2021   CL 103  04/06/2021   CO2 29 04/06/2021   GLUCOSE 102 (H) 04/06/2021   BUN 16 04/06/2021   CREATININE 1.06 04/06/2021   BILITOT 0.7 04/06/2021   ALKPHOS 53 04/06/2021   AST 25 04/06/2021   ALT 21 04/06/2021   PROT 7.0 04/06/2021   ALBUMIN 3.9 04/06/2021   CALCIUM 9.8 04/06/2021   GFRAA >60 05/23/2020   QFTBGOLD NEGATIVE 07/16/2017   QFTBGOLDPLUS NEGATIVE 08/09/2020    Speciality Comments: No specialty comments available.  Procedures:  No procedures performed Allergies: Simponi [golimumab], Aspirin, Other, and Theophyllines   Assessment / Plan:     Visit Diagnoses: Rheumatoid arthritis of multiple sites without rheumatoid factor (HCC)-he is doing much better after the physical therapy.  He had no synovitis on examination today.  High risk medication use - Orencia 750 mg IV infusions every 6 weeks-spacing orencia due to recurrent infections.  Labs from  June 2022 were within normal limits.  He has been advised to get labs with infusions as scheduled.  Instructions regarding the COVID-19 booster were placed in the AVS.  Instructions regarding other immunization were also placed in the AVS.  He was advised to stop Orencia in case he develops an infection and restart after infection resolves.  Pain in both hands - x-rays were consistent with rheumatoid arthritis and osteoarthritis.  There was no radiographic progression when compared to the x-rays of 2019.    Primary osteoarthritis of both hands - he has severe osteoarthritis in his bilateral hands.  Doing better after physical therapy.  Joint protection and muscle strengthening was discussed.  Pain in both feet - X-rays were consistent with osteoarthritis.  No radiographic progression was noted when compared to the x-rays of 2021.  Doing better after physical therapy.  Other medical problems are listed as follows:  Coarse tremors  History of depression  History of gastroesophageal reflux (GERD)  History of asthma  Family history of MS (multiple sclerosis) - he has been followed by the neurologist.  Memory loss - His neurologist is concerned about possible dementia.  He was taken off the antidepressants for that reason.  Orders: No orders of the defined types were placed in this encounter.  No orders of the defined types were placed in this encounter.    Follow-Up Instructions: Return in about 5 months (around 10/09/2021) for Rheumatoid arthritis.   [name] Merino, MD  Note - This record has been created using Editor, commissioning.  Chart creation errors have been sought, but may not always  have been located. Such creation errors do not reflect on  the standard of medical care.

## 2021-05-03 NOTE — Progress Notes (Signed)
HPI M former smoker followed for asthma, allergic rhinitis, hx rhinosinusitis, nasal polyps complicated by rheumatoid arthritis. FENO- 08/07/16- 25 Office spirometry 08/07/16- WNL, FEV1 3.59/ 110%, R 0.81 PFT 11/01/16- WNL-FVC 5.30/123%, FEV1 4.0/124%, ratio 0.75, FEF 25-75% 3.49/135%, TLC 131%, DLCO 99% Quantiferon-TB Gold 08/12/19- NEG  ---------------------------------------------------------------   11/03/20- 70 year old male former smoker followed for Asthma, Allergic Rhinitis, history rhinosinusitis, nasal polyps, complicated by Rheumatoid Arthritis// Orencia, Bipolar, GERD, Alzheimers, Pro-air HFA, Trelegy 100, Singulair,. Flonase, Covid vax-3 Phizer Flu vax-had ACT score 25 Newly dx'd Alzheimers> anxious    Dr Tat/ Neurology  Doesn't like dispenser for generic albuterol. Has  Been using it 1-2x/ day, blaming "stress'. Price of Trelegy now 160$- getting too expensive.  He is going to [employer].   05/04/21- 70 year old male former smoker followed for Asthma, Allergic Rhinitis, history rhinosinusitis, nasal polyps, complicated by Rheumatoid Arthritis// Orencia, Bipolar, GERD, Alzheimers, -Pro-air HFA, Trelegy 100, Singulair,. Flonase, Covid vax-3 Phizer Breathing has done very well with no acute events. Occasional rescue inhaler. Likes Trelegy. Mentions urinary urgency w/o dysuria x 2 months. He is to contact Dr Lysle Rubens about this.  ROS-see HPI    + = positive Constitutional:   No-   weight loss, night sweats, fevers, chills, fatigue, lassitude. HEENT:   No-  headaches, difficulty swallowing, tooth/dental problems, sore throat,       No-  sneezing, itching, ear ache, nasal congestion,+ post nasal drip,  CV:  No-   chest pain, orthopnea, PND, swelling in lower extremities, anasarca, dizziness, palpitations Resp: No-   shortness of breath with exertion or at rest.                productive cough,   non-productive cough,   coughing up of blood.                 change in  color of mucus.    wheezing.   Skin: No rash GI:  heartburn, indigestion, abdominal pain, nausea, vomiting, GU:  MS: +  joint pain or swelling.   Neuro-     nothing unusual Psych:  No- change in mood or affect. No depression or anxiety.  No memory loss.  OBJ General- Alert, Oriented, Affect-+ anxious, Distress- none acute, slender Skin-  Lymphadenopathy- none Head- atraumatic            Eyes- Gross vision intact, PERRLA, conjunctivae clear secretions            Ears- Hearing, canals-normal            Nose-  no-Septal dev, mucus, polyps-, erosion, perforation             Throat- Mallampati III , mucosa clear , drainage- none, tonsils- atrophic, Neck- flexible , trachea midline, no stridor , thyroid nl, carotid no bruit Chest - symmetrical excursion , unlabored           Heart/CV- RRR , no murmur , no gallop  , no rub, nl s1 s2                           - JVD- none , edema- none, stasis changes- none, varices- none           Lung- + clear,, wheeze- none, cough -none                               dullness-none, rub- none  Chest wall-  Abd-  Br/ Gen/ Rectal- Not done, not indicated Extrem- cyanosis- none, clubbing, none, atrophy- none, strength- nl.                          +Synovial thickening of hand joints. Neuro- + tremor hands, some word-searching

## 2021-05-04 ENCOUNTER — Ambulatory Visit (INDEPENDENT_AMBULATORY_CARE_PROVIDER_SITE_OTHER): Payer: Medicare Other | Admitting: Internal Medicine

## 2021-05-04 ENCOUNTER — Encounter: Payer: Self-pay | Admitting: Internal Medicine

## 2021-05-04 ENCOUNTER — Other Ambulatory Visit: Payer: Self-pay

## 2021-05-04 DIAGNOSIS — M0609 Rheumatoid arthritis without rheumatoid factor, multiple sites: Secondary | ICD-10-CM

## 2021-05-04 DIAGNOSIS — J45998 Other asthma: Secondary | ICD-10-CM

## 2021-05-04 MED ORDER — TRELEGY ELLIPTA 100-62.5-25 MCG/INH IN AEPB: INHALATION_SPRAY | RESPIRATORY_TRACT | 12 refills | Status: DC

## 2021-05-04 MED ORDER — ALBUTEROL SULFATE HFA 108 (90 BASE) MCG/ACT IN AERS: INHALATION_SPRAY | RESPIRATORY_TRACT | 12 refills | Status: DC

## 2021-05-04 NOTE — Assessment & Plan Note (Signed)
Mild persistent uncomplicated Current meds work very well Plan- Ship broker and Trelegy refilled

## 2021-05-04 NOTE — Patient Instructions (Signed)
We refilled your Proair and Trelegy inhalers  Glad you are doing well. Please call if we can help

## 2021-05-04 NOTE — Assessment & Plan Note (Addendum)
Follows with Rheumatology, continuing Orencia  infusions

## 2021-05-09 ENCOUNTER — Other Ambulatory Visit: Payer: Self-pay

## 2021-05-09 ENCOUNTER — Ambulatory Visit (INDEPENDENT_AMBULATORY_CARE_PROVIDER_SITE_OTHER): Payer: Medicare Other | Admitting: Rheumatology

## 2021-05-09 ENCOUNTER — Encounter: Payer: Self-pay | Admitting: Rheumatology

## 2021-05-09 VITALS — BP 122/80 | HR 47 | Ht 68.0 in | Wt 165.6 lb

## 2021-05-09 DIAGNOSIS — M19041 Primary osteoarthritis, right hand: Secondary | ICD-10-CM | POA: Diagnosis not present

## 2021-05-09 DIAGNOSIS — Z79899 Other long term (current) drug therapy: Secondary | ICD-10-CM

## 2021-05-09 DIAGNOSIS — M79671 Pain in right foot: Secondary | ICD-10-CM

## 2021-05-09 DIAGNOSIS — Z8719 Personal history of other diseases of the digestive system: Secondary | ICD-10-CM | POA: Diagnosis not present

## 2021-05-09 DIAGNOSIS — Z8659 Personal history of other mental and behavioral disorders: Secondary | ICD-10-CM

## 2021-05-09 DIAGNOSIS — R413 Other amnesia: Secondary | ICD-10-CM | POA: Diagnosis not present

## 2021-05-09 DIAGNOSIS — M79641 Pain in right hand: Secondary | ICD-10-CM | POA: Diagnosis not present

## 2021-05-09 DIAGNOSIS — M19042 Primary osteoarthritis, left hand: Secondary | ICD-10-CM

## 2021-05-09 DIAGNOSIS — Z8709 Personal history of other diseases of the respiratory system: Secondary | ICD-10-CM | POA: Diagnosis not present

## 2021-05-09 DIAGNOSIS — M0609 Rheumatoid arthritis without rheumatoid factor, multiple sites: Secondary | ICD-10-CM | POA: Diagnosis not present

## 2021-05-09 DIAGNOSIS — M79642 Pain in left hand: Secondary | ICD-10-CM

## 2021-05-09 DIAGNOSIS — M79672 Pain in left foot: Secondary | ICD-10-CM

## 2021-05-09 DIAGNOSIS — G252 Other specified forms of tremor: Secondary | ICD-10-CM | POA: Diagnosis not present

## 2021-05-09 DIAGNOSIS — Z820 Family history of epilepsy and other diseases of the nervous system: Secondary | ICD-10-CM | POA: Diagnosis not present

## 2021-05-09 NOTE — Patient Instructions (Signed)
COVID-19 vaccine recommendations:   COVID-19 vaccine is recommended for everyone (unless you are allergic to a vaccine component), even if you are on a medication that suppresses your immune system.   If you are on Methotrexate, Cellcept (mycophenolate), Rinvoq, Morrie Sheldon, and Olumiant- hold the medication for 1 week after each vaccine. Hold Methotrexate for 2 weeks after the single dose COVID-19 vaccine.   If you are on Orencia subcutaneous injection - hold medication one week prior to and one week after the first COVID-19 vaccine dose (only).   If you are on Orencia IV infusions- time vaccination administration so that the first COVID-19 vaccination will occur four weeks after the infusion and postpone the subsequent infusion by one week.   If you are on Cyclophosphamide or Rituxan infusions please contact your doctor prior to receiving the COVID-19 vaccine.   Do not take Tylenol or any anti-inflammatory medications (NSAIDs) 24 hours prior to the COVID-19 vaccination.   There is no direct evidence about the efficacy of the COVID-19 vaccine in individuals who are on medications that suppress the immune system.   Even if you are fully vaccinated, and you are on any medications that suppress your immune system, please continue to wear a mask, maintain at least six feet social distance and practice hand hygiene.   If you develop a COVID-19 infection, please contact your PCP or our office to determine if you need monoclonal antibody infusion.  The booster vaccine is now available for immunocompromised patients.   Please see the following web sites for updated information.   [email or web address]  Vaccines You are taking a medication(s) that can suppress your immune system.  The following immunizations are recommended: Flu annually Covid-19  Td/Tdap (tetanus, diphtheria, pertussis) every 10 years Pneumonia (Prevnar 15 then  Pneumovax 23 at least 1 year apart.  Alternatively, can take Prevnar 20 without needing additional dose) Shingrix (after age 69): 2 doses from 4 weeks to 6 months apart  Please check with your PCP to make sure you are up to date.    If you test POSITIVE for COVID19 and have MILD to MODERATE symptoms: First, call your PCP if you would like to receive COVID19 treatment AND Hold your medications during the infection and for at least 1 week after your symptoms have resolved: Injectable medication (Benlysta, Cimzia, Cosentyx, Enbrel, Humira, Orencia, Remicade, Simponi, Stelara, Taltz, Tremfya) Methotrexate Leflunomide (Arava) Azathioprine Mycophenolate (Cellcept) Roma Kayser, or Rinvoq Otezla If you take Actemra or Kevzara, you DO NOT need to hold these for COVID19 infection.  If you test POSITIVE for COVID19 and have NO symptoms: First, call your PCP if you would like to receive COVID19 treatment AND Hold your medications for at least 10 days after the day that you tested positive Injectable medication (Benlysta, Cimzia, Cosentyx, Enbrel, Humira, Orencia, Remicade, Simponi, Stelara, Taltz, Tremfya) Methotrexate Leflunomide (Arava) Azathioprine Mycophenolate (Cellcept) Roma Kayser, or Rinvoq Otezla If you take Actemra or Kevzara, you DO NOT need to hold these for COVID19 infection.  If you have signs or symptoms of an infection or start antibiotics: First, call your PCP for workup of your infection. Hold your medication through the infection, until you complete your antibiotics, and until symptoms resolve if you take the following: Injectable medication (Actemra, Benlysta, Cimzia, Cosentyx, Enbrel, Humira, Kevzara, Orencia, Remicade, Simponi, Stelara, Taltz, Tremfya) Methotrexate Leflunomide (Arava) Mycophenolate (Cellcept) Morrie Sheldon, Olumiant, or Rinvoq  Heart Disease Prevention   Your inflammatory disease increases your risk of heart disease which includes heart attack,  stroke, atrial  fibrillation (irregular heartbeats), high blood pressure, heart failure and atherosclerosis (plaque in the arteries).  It is important to reduce your risk by:   Keep blood pressure, cholesterol, and blood sugar at healthy levels   Smoking Cessation   Maintain a healthy weight  BMI 20-25   Eat a healthy diet  Plenty of fresh fruit, vegetables, and whole grains  Limit saturated fats, foods high in sodium, and added sugars  DASH and Mediterranean diet   Increase physical activity  Recommend moderate physically activity for 150 minutes per week/ 30 minutes a day for five days a week These can be broken up into three separate ten-minute sessions during the day.   Reduce Stress  Meditation, slow breathing exercises, yoga, coloring books  Dental visits twice a year

## 2021-05-17 ENCOUNTER — Other Ambulatory Visit: Payer: Self-pay | Admitting: Pharmacist

## 2021-05-17 DIAGNOSIS — M0609 Rheumatoid arthritis without rheumatoid factor, multiple sites: Secondary | ICD-10-CM

## 2021-05-17 DIAGNOSIS — Z79899 Other long term (current) drug therapy: Secondary | ICD-10-CM

## 2021-05-17 NOTE — Progress Notes (Signed)
Next infusion scheduled for Orencia on 05/18/21 and due for updated orders. Diagnosis: RA  Dose: 750 mg every 6 weeks (appropriate based on last recorded weight of 75.1 kg)  Last Clinic Visit: 05/09/21 Next Clinic Visit: 10/11/21  Last infusion: 04/06/21 Labs: 04/06/21 CBC wnl, CMP wnl TB Gold: negative on 08/09/20   Orders placed for Orencia '750mg'$  x 3 doses along with premedication of Tylenol and Benadryl to be administered 30 minutes before medication infusion.  Standing CBC with diff/platelet and CMP with GFR orders placed to be drawn every 3 months (every other infusion).  Next TB gold due November 2022. Order placed for TB gold to be drawn with November infusion  Knox Saliva, PharmD, MPH, BCPS Clinical Pharmacist (Rheumatology and Pulmonology

## 2021-05-18 ENCOUNTER — Other Ambulatory Visit: Payer: Self-pay

## 2021-05-18 ENCOUNTER — Ambulatory Visit (HOSPITAL_COMMUNITY)
Admission: RE | Admit: 2021-05-18 | Discharge: 2021-05-18 | Disposition: A | Discharge status: Home / Self Care | Payer: Medicare Other | Source: Ambulatory Visit | Attending: Rheumatology | Admitting: Rheumatology

## 2021-05-18 DIAGNOSIS — M0609 Rheumatoid arthritis without rheumatoid factor, multiple sites: Secondary | ICD-10-CM | POA: Insufficient documentation

## 2021-05-18 DIAGNOSIS — Z79899 Other long term (current) drug therapy: Secondary | ICD-10-CM | POA: Insufficient documentation

## 2021-05-18 LAB — CBC WITH DIFFERENTIAL/PLATELET
Abs Immature Granulocytes: 0.02 10*3/uL (ref 0.00–0.07)
Basophils Absolute: 0 10*3/uL (ref 0.0–0.1)
Basophils Relative: 1 %
Eosinophils Absolute: 0 10*3/uL (ref 0.0–0.5)
Eosinophils Relative: 1 %
HCT: 41.1 % (ref 39.0–52.0)
Hemoglobin: 13.3 g/dL (ref 13.0–17.0)
Immature Granulocytes: 0 %
Lymphocytes Relative: 37 %
Lymphs Abs: 1.8 10*3/uL (ref 0.7–4.0)
MCH: 31.3 pg (ref 26.0–34.0)
MCHC: 32.4 g/dL (ref 30.0–36.0)
MCV: 96.7 fL (ref 80.0–100.0)
Monocytes Absolute: 0.6 10*3/uL (ref 0.1–1.0)
Monocytes Relative: 13 %
Neutro Abs: 2.3 10*3/uL (ref 1.7–7.7)
Neutrophils Relative %: 48 %
Platelets: 337 10*3/uL (ref 150–400)
RBC: 4.25 MIL/uL (ref 4.22–5.81)
RDW: 13.1 % (ref 11.5–15.5)
WBC: 4.8 10*3/uL (ref 4.0–10.5)
nRBC: 0 % (ref 0.0–0.2)

## 2021-05-18 LAB — COMPREHENSIVE METABOLIC PANEL
ALT: 18 U/L (ref 0–44)
AST: 20 U/L (ref 15–41)
Albumin: 3.8 g/dL (ref 3.5–5.0)
Alkaline Phosphatase: 62 U/L (ref 38–126)
Anion gap: 7 (ref 5–15)
BUN: 15 mg/dL (ref 8–23)
CO2: 29 mmol/L (ref 22–32)
Calcium: 9.7 mg/dL (ref 8.9–10.3)
Chloride: 103 mmol/L (ref 98–111)
Creatinine, Ser: 1.03 mg/dL (ref 0.61–1.24)
GFR, Estimated: >60 mL/min (ref >60)
Glucose, Bld: 81 mg/dL (ref 70–99)
Potassium: 4 mmol/L (ref 3.5–5.1)
Sodium: 139 mmol/L (ref 135–145)
Total Bilirubin: 0.8 mg/dL (ref 0.3–1.2)
Total Protein: 7.3 g/dL (ref 6.5–8.1)

## 2021-05-18 MED ORDER — ABATACEPT 250 MG IV SOLR
750.0000 mg | INTRAVENOUS | Status: DC
  Administered 2021-05-18: 750 mg via INTRAVENOUS
  Filled 2021-05-18: qty 30

## 2021-05-18 MED ORDER — ACETAMINOPHEN 325 MG PO TABS
ORAL_TABLET | ORAL | Status: AC
  Filled 2021-05-18: qty 2

## 2021-05-18 MED ORDER — DIPHENHYDRAMINE HCL 25 MG PO CAPS
ORAL_CAPSULE | ORAL | Status: AC
  Filled 2021-05-18: qty 1

## 2021-05-18 MED ORDER — DIPHENHYDRAMINE HCL 25 MG PO CAPS
25.0000 mg | ORAL_CAPSULE | ORAL | Status: DC
  Administered 2021-05-18: 25 mg via ORAL

## 2021-05-18 MED ORDER — ACETAMINOPHEN 325 MG PO TABS
650.0000 mg | ORAL_TABLET | ORAL | Status: DC
  Administered 2021-05-18: 650 mg via ORAL

## 2021-05-18 NOTE — Progress Notes (Signed)
CBC with diff WNL

## 2021-05-18 NOTE — Progress Notes (Signed)
CMP WNL

## 2021-06-09 DIAGNOSIS — M069 Rheumatoid arthritis, unspecified: Secondary | ICD-10-CM | POA: Diagnosis not present

## 2021-06-09 DIAGNOSIS — R3915 Urgency of urination: Secondary | ICD-10-CM | POA: Diagnosis not present

## 2021-06-09 DIAGNOSIS — Z125 Encounter for screening for malignant neoplasm of prostate: Secondary | ICD-10-CM | POA: Diagnosis not present

## 2021-06-09 DIAGNOSIS — I10 Essential (primary) hypertension: Secondary | ICD-10-CM | POA: Diagnosis not present

## 2021-06-09 DIAGNOSIS — K219 Gastro-esophageal reflux disease without esophagitis: Secondary | ICD-10-CM | POA: Diagnosis not present

## 2021-06-09 DIAGNOSIS — R21 Rash and other nonspecific skin eruption: Secondary | ICD-10-CM | POA: Diagnosis not present

## 2021-06-09 DIAGNOSIS — Z0000 Encounter for general adult medical examination without abnormal findings: Secondary | ICD-10-CM | POA: Diagnosis not present

## 2021-06-09 DIAGNOSIS — J4520 Mild intermittent asthma, uncomplicated: Secondary | ICD-10-CM | POA: Diagnosis not present

## 2021-06-09 DIAGNOSIS — F319 Bipolar disorder, unspecified: Secondary | ICD-10-CM | POA: Diagnosis not present

## 2021-06-09 DIAGNOSIS — Z23 Encounter for immunization: Secondary | ICD-10-CM | POA: Diagnosis not present

## 2021-06-09 DIAGNOSIS — Z1389 Encounter for screening for other disorder: Secondary | ICD-10-CM | POA: Diagnosis not present

## 2021-06-09 DIAGNOSIS — E785 Hyperlipidemia, unspecified: Secondary | ICD-10-CM | POA: Diagnosis not present

## 2021-06-09 DIAGNOSIS — F331 Major depressive disorder, recurrent, moderate: Secondary | ICD-10-CM | POA: Diagnosis not present

## 2021-06-13 ENCOUNTER — Ambulatory Visit (INDEPENDENT_AMBULATORY_CARE_PROVIDER_SITE_OTHER): Payer: Medicare Other | Admitting: Adult Health

## 2021-06-13 ENCOUNTER — Ambulatory Visit: Payer: Medicare Other | Admitting: Physician Assistant

## 2021-06-13 ENCOUNTER — Other Ambulatory Visit: Payer: Self-pay

## 2021-06-13 ENCOUNTER — Encounter: Payer: Self-pay | Admitting: Adult Health

## 2021-06-13 DIAGNOSIS — F411 Generalized anxiety disorder: Secondary | ICD-10-CM

## 2021-06-13 DIAGNOSIS — F39 Unspecified mood [affective] disorder: Secondary | ICD-10-CM

## 2021-06-13 DIAGNOSIS — F331 Major depressive disorder, recurrent, moderate: Secondary | ICD-10-CM | POA: Diagnosis not present

## 2021-06-13 DIAGNOSIS — G4700 Insomnia, unspecified: Secondary | ICD-10-CM | POA: Diagnosis not present

## 2021-06-13 NOTE — Progress Notes (Signed)
ALIN MAISTO SN:[id number] 1950-11-08 70 y.o.  Subjective:   Patient ID:  [name] [name] is a 70 y.o. (DOB [birth year]) male.  Chief Complaint: No chief complaint on file.   HPI SCOTTY KRATOVIL presents to the office today for follow-up of anxiety, depression, mood disorder, and insomnia.  Describes mood today as "ok". Pleasant. Mood symptoms - denies depression, anxiety, and irritability. Has reduced Abilify from 7.'5mg'$  to '5mg'$  daily - "feels better on that dose". Stating "I'm doing good". Tremors have improved with Propranolol. Stable interest and motivation. Taking medications as prescribed.  Energy levels stable. Active, has a regular exercise routine - going to the gym. Working with P/T. Enjoys some usual interests and activities. Single. Not dating. Lives alone. Spending time with dog "Minnie". Talking with children. Appetite adequate. Weight loss 3 pounds - 160 from 163 pounds.   Sleeps well most nights. Averages 3 to 5 hours. Occasional daytime napping. Focus and concentration stable - "seems to be good". Completing tasks. Managing aspects of household. Retired. Denies SI or HI.  Denies AH or VH.   Flowsheet Row INFUSION 120 from 05/18/2021 in [facility] 120 from 04/06/2021 in [facility] 120 from 11/14/2020 in [facility] No Risk No Risk No Risk        Review of Systems:  Review of Systems  Musculoskeletal:  Negative for gait problem.  Neurological:  Negative for tremors.  Psychiatric/Behavioral:         Please refer to HPI   Medications: I have reviewed the patient's current medications.  Current Outpatient Medications  Medication Sig Dispense Refill   Abatacept (ORENCIA IV) Inject into the vein every 6 (six) weeks.     acetaminophen (TYLENOL) 325 MG tablet Take 650 mg by mouth every 6 (six) hours as needed.     albuterol (PROAIR  HFA) 108 (90 Base) MCG/ACT inhaler INHALE 1-2 PUFFS EVERY 6 HOURS AS NEEDED FOR WHEEZING OR SHORTNESS OF BREATH 8.5 g 12   ARIPiprazole (ABILIFY) 5 MG tablet Take one and one half tablets daily. 135 tablet 3   fluticasone (FLONASE) 50 MCG/ACT nasal spray Place 1 spray into both nostrils daily. 48 g 4   Fluticasone-Umeclidin-Vilant (TRELEGY ELLIPTA) 100-62.5-25 MCG/INH AEPB INHALE ONE PUFF BY MOUTH DAILY 60 each 12   guaiFENesin (MUCUS RELIEF ADULT PO) Take by mouth daily.     loratadine (CLARITIN) 10 MG tablet Take 10 mg by mouth daily.     losartan-hydrochlorothiazide (HYZAAR) 100-25 MG tablet Take 1 tablet by mouth daily.      montelukast (SINGULAIR) 10 MG tablet TAKE ONE TABLET BY MOUTH DAILY 90 tablet 2   nystatin (MYCOSTATIN) 100000 UNIT/ML suspension Take 5 mLs by mouth daily. prn     Omeprazole (PRILOSEC PO) Take by mouth 2 (two) times daily.      pantoprazole (PROTONIX) 20 MG tablet Take 20 mg by mouth 2 (two) times daily.     Probiotic Product (PROBIOTIC PO) Take 1 tablet by mouth daily.      propranolol (INDERAL) 20 MG tablet Take 1 tablet (20 mg total) by mouth 2 (two) times daily. 180 tablet 1   No current facility-administered medications for this visit.    Medication Side Effects: None  Allergies:  Allergies  Allergen Reactions   Simponi [Golimumab]     Repeated infections   Aspirin     REACTION: throat swelling   Other Other (  See Comments)    REACTION: oral sores Other reaction(s): rash   Theophyllines     Past Medical History:  Diagnosis Date   Allergic-infective asthma 12/24/2007   FENO- 08/07/16- 25 Office spirometry 08/07/16- WNL, FEV1 3.59/ 110%, R 0.81    Asthmatic bronchitis with acute exacerbation 10/15/2016   Coarse tremors 12/29/2019   Upper extremities, bilateral   Diplopia 12/29/2019   Generalized anxiety disorder    GERD (gastroesophageal reflux disease) 12/24/2007   Major depressive disorder in remission 07/15/2017   Mild neurocognitive disorder  09/14/2020   Polyp of nasal cavity 12/24/2007   Regular astigmatism of both eyes 12/29/2019   Rheumatoid arthritis of multiple sites without rheumatoid factor 09/18/2010   Seasonal and perennial allergic rhinitis 12/24/2007   Sinusitis, chronic 12/24/2007   Recurrent acute maxillary and frontal sinusitis     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   Please see review of systems for further details on the patient's review from today.   Objective:   Physical Exam:  There were no vitals taken for this visit.  Physical Exam Constitutional:      General: He is not in acute distress. Musculoskeletal:        General: No deformity.  Neurological:     Mental Status: He is alert and oriented to person, place, and time.     Coordination: Coordination normal.  Psychiatric:        Attention and Perception: Attention and perception normal. He does not perceive auditory or visual hallucinations.        Mood and Affect: Mood normal. Mood is not anxious or depressed. Affect is not labile, blunt, angry or inappropriate.        Speech: Speech normal.        Behavior: Behavior normal.        Thought Content: Thought content normal. Thought content is not paranoid or delusional. Thought content does not include homicidal or suicidal ideation. Thought content does not include homicidal or suicidal plan.        Cognition and Memory: Cognition and memory normal.        Judgment: Judgment normal.     Comments: Insight intact    Lab Review:     Component Value Date/Time   NA 139 05/18/2021 0931   K 4.0 05/18/2021 0931   CL 103 05/18/2021 0931   CO2 29 05/18/2021 0931   GLUCOSE 81 05/18/2021 0931   BUN 15 05/18/2021 0931   CREATININE 1.03 05/18/2021 0931   CREATININE 1.10 12/07/2019 0859   CALCIUM 9.7 05/18/2021 0931   PROT 7.3 05/18/2021 0931   ALBUMIN 3.8  05/18/2021 0931   AST 20 05/18/2021 0931   ALT 18 05/18/2021 0931   ALKPHOS 62 05/18/2021 0931   BILITOT 0.8 05/18/2021 0931   GFRNONAA >60 05/18/2021 0931   GFRNONAA 69 12/07/2019 0859   GFRAA >60 05/23/2020 0908   GFRAA 80 12/07/2019 0859       Component Value Date/Time   WBC 4.8 05/18/2021 0931   RBC 4.25 05/18/2021 0931   HGB 13.3 05/18/2021 0931   HCT 41.1 05/18/2021 0931   PLT 337 05/18/2021 0931   MCV 96.7 05/18/2021 0931   MCH 31.3 05/18/2021 0931   MCHC 32.4 05/18/2021 0931   RDW 13.1 05/18/2021 0931   LYMPHSABS 1.8 05/18/2021 0931   MONOABS 0.6 05/18/2021 0931   EOSABS 0.0 05/18/2021 0931  BASOSABS 0.0 05/18/2021 0931    Lithium Lvl  Date Value Ref Range Status  05/23/2017 0.7 0.6 - 1.2 mmol/L Final    Comment:    ** Please note change in unit of measure and reference range(s). **     No results found for: PHENYTOIN, PHENOBARB, VALPROATE, CBMZ   .res Assessment: Plan:     Plan:  Propranolol '10mg'$  to '20mg'$  BID for tremors - Dr Tat increased at recent visit - no visible tremor at today's visit.  Abilify '5mg'$  tablet daily   RTC 5 months  Patient advised to contact office with any questions, adverse effects, or acute worsening in signs and symptoms.  Discussed potential benefits, risk, and side effects of benzodiazepines to include potential risk of tolerance and dependence, as well as possible drowsiness. Advised patient not to drive if experiencing drowsiness and to take lowest possible effective dose to minimize risk of dependence and tolerance.  Discussed potential metabolic side effects associated with atypical antipsychotics, as well as potential risk for movement side effects. Advised pt to contact office if movement side effects occur.   Diagnoses and all orders for this visit:  Insomnia, unspecified type  Major depressive disorder, recurrent episode, moderate (HCC)  Generalized anxiety disorder  Episodic mood disorder ([place])    Please see  After Visit Summary for patient specific instructions.  Future Appointments  Date Time Provider [place]  06/29/2021 10:00 AM [facility] None  09/07/2021  3:30 PM Tat, Eustace Quail, DO [facility] None  10/11/2021 10:45 AM Bo Merino, MD [facility] None  12/11/2021  8:30 AM Hazle Coca, PhD [facility] None  12/11/2021  9:30 AM [facility] NEUROPSYCH TECH [facility] None  12/18/2021 10:00 AM Hazle Coca, PhD [facility] None  05/04/2022 11:30 AM Deneise Lever, MD [facility] None    No orders of the defined types were placed in this encounter.   -------------------------------

## 2021-06-15 ENCOUNTER — Ambulatory Visit (HOSPITAL_COMMUNITY): Payer: Medicare Other

## 2021-06-28 ENCOUNTER — Other Ambulatory Visit (HOSPITAL_COMMUNITY): Payer: Self-pay | Admitting: *Deleted

## 2021-06-28 ENCOUNTER — Other Ambulatory Visit: Payer: Self-pay | Admitting: Internal Medicine

## 2021-06-28 DIAGNOSIS — J4531 Mild persistent asthma with (acute) exacerbation: Secondary | ICD-10-CM

## 2021-06-29 ENCOUNTER — Encounter (HOSPITAL_COMMUNITY)
Admission: RE | Admit: 2021-06-29 | Discharge: 2021-06-29 | Disposition: A | Discharge status: Home / Self Care | Payer: Medicare Other | Source: Ambulatory Visit | Attending: Rheumatology | Admitting: Rheumatology

## 2021-06-29 ENCOUNTER — Other Ambulatory Visit: Payer: Self-pay

## 2021-06-29 DIAGNOSIS — M0609 Rheumatoid arthritis without rheumatoid factor, multiple sites: Secondary | ICD-10-CM | POA: Diagnosis not present

## 2021-06-29 DIAGNOSIS — Z79899 Other long term (current) drug therapy: Secondary | ICD-10-CM | POA: Diagnosis not present

## 2021-06-29 MED ORDER — SODIUM CHLORIDE 0.9 % IV SOLN
750.0000 mg | INTRAVENOUS | Status: DC
  Administered 2021-06-29: 750 mg via INTRAVENOUS
  Filled 2021-06-29: qty 30

## 2021-06-29 MED ORDER — DIPHENHYDRAMINE HCL 25 MG PO CAPS
25.0000 mg | ORAL_CAPSULE | ORAL | Status: DC
Start: 2021-06-29 — End: 2021-06-30

## 2021-06-29 MED ORDER — ACETAMINOPHEN 325 MG PO TABS
650.0000 mg | ORAL_TABLET | ORAL | Status: DC
Start: 2021-06-29 — End: 2021-06-30

## 2021-07-02 LAB — QUANTIFERON-TB GOLD PLUS (RQFGPL)
QuantiFERON Mitogen Value: 2.98 IU/mL
QuantiFERON Nil Value: 0.1 IU/mL
QuantiFERON TB1 Ag Value: 0.1 IU/mL
QuantiFERON TB2 Ag Value: 0.12 IU/mL

## 2021-07-02 LAB — QUANTIFERON-TB GOLD PLUS: QuantiFERON-TB Gold Plus: NEGATIVE

## 2021-07-02 NOTE — Progress Notes (Signed)
TB Gold is negative.

## 2021-07-18 DIAGNOSIS — Z23 Encounter for immunization: Secondary | ICD-10-CM | POA: Diagnosis not present

## 2021-08-10 ENCOUNTER — Ambulatory Visit (HOSPITAL_COMMUNITY)
Admission: RE | Admit: 2021-08-10 | Discharge: 2021-08-10 | Disposition: A | Discharge status: Home / Self Care | Payer: Medicare Other | Source: Ambulatory Visit | Attending: Rheumatology | Admitting: Rheumatology

## 2021-08-10 ENCOUNTER — Other Ambulatory Visit: Payer: Self-pay

## 2021-08-10 DIAGNOSIS — M0609 Rheumatoid arthritis without rheumatoid factor, multiple sites: Secondary | ICD-10-CM | POA: Diagnosis not present

## 2021-08-10 DIAGNOSIS — Z79899 Other long term (current) drug therapy: Secondary | ICD-10-CM | POA: Insufficient documentation

## 2021-08-10 LAB — COMPREHENSIVE METABOLIC PANEL
ALT: 14 U/L (ref 0–44)
AST: 17 U/L (ref 15–41)
Albumin: 3.6 g/dL (ref 3.5–5.0)
Alkaline Phosphatase: 60 U/L (ref 38–126)
Anion gap: 8 (ref 5–15)
BUN: 16 mg/dL (ref 8–23)
CO2: 28 mmol/L (ref 22–32)
Calcium: 9.2 mg/dL (ref 8.9–10.3)
Chloride: 102 mmol/L (ref 98–111)
Creatinine, Ser: 1.05 mg/dL (ref 0.61–1.24)
GFR, Estimated: >60 mL/min (ref >60)
Glucose, Bld: 90 mg/dL (ref 70–99)
Potassium: 3.7 mmol/L (ref 3.5–5.1)
Sodium: 138 mmol/L (ref 135–145)
Total Bilirubin: 0.3 mg/dL (ref 0.3–1.2)
Total Protein: 7 g/dL (ref 6.5–8.1)

## 2021-08-10 LAB — CBC WITH DIFFERENTIAL/PLATELET
Abs Immature Granulocytes: 0.02 10*3/uL (ref 0.00–0.07)
Basophils Absolute: 0.1 10*3/uL (ref 0.0–0.1)
Basophils Relative: 1 %
Eosinophils Absolute: 0.1 10*3/uL (ref 0.0–0.5)
Eosinophils Relative: 2 %
HCT: 39.4 % (ref 39.0–52.0)
Hemoglobin: 13 g/dL (ref 13.0–17.0)
Immature Granulocytes: 0 %
Lymphocytes Relative: 34 %
Lymphs Abs: 1.8 10*3/uL (ref 0.7–4.0)
MCH: 31 pg (ref 26.0–34.0)
MCHC: 33 g/dL (ref 30.0–36.0)
MCV: 93.8 fL (ref 80.0–100.0)
Monocytes Absolute: 0.6 10*3/uL (ref 0.1–1.0)
Monocytes Relative: 11 %
Neutro Abs: 2.7 10*3/uL (ref 1.7–7.7)
Neutrophils Relative %: 52 %
Platelets: 276 10*3/uL (ref 150–400)
RBC: 4.2 MIL/uL — ABNORMAL LOW (ref 4.22–5.81)
RDW: 12.5 % (ref 11.5–15.5)
WBC: 5.1 10*3/uL (ref 4.0–10.5)
nRBC: 0 % (ref 0.0–0.2)

## 2021-08-10 MED ORDER — SODIUM CHLORIDE 0.9 % IV SOLN
750.0000 mg | INTRAVENOUS | Status: DC
  Administered 2021-08-10: 750 mg via INTRAVENOUS
  Filled 2021-08-10: qty 30

## 2021-08-10 MED ORDER — ACETAMINOPHEN 325 MG PO TABS: 650.0000 mg | ORAL_TABLET | ORAL | Status: DC

## 2021-08-10 MED ORDER — DIPHENHYDRAMINE HCL 25 MG PO CAPS: 25.0000 mg | ORAL_CAPSULE | ORAL | Status: DC

## 2021-08-10 NOTE — Progress Notes (Signed)
CBC and CMP are normal.

## 2021-08-15 DIAGNOSIS — R001 Bradycardia, unspecified: Secondary | ICD-10-CM | POA: Diagnosis not present

## 2021-08-25 ENCOUNTER — Other Ambulatory Visit: Payer: Self-pay | Admitting: Pharmacist

## 2021-08-25 DIAGNOSIS — M069 Rheumatoid arthritis, unspecified: Secondary | ICD-10-CM | POA: Diagnosis not present

## 2021-08-25 DIAGNOSIS — F319 Bipolar disorder, unspecified: Secondary | ICD-10-CM | POA: Diagnosis not present

## 2021-08-25 DIAGNOSIS — Z79899 Other long term (current) drug therapy: Secondary | ICD-10-CM

## 2021-08-25 DIAGNOSIS — J4520 Mild intermittent asthma, uncomplicated: Secondary | ICD-10-CM | POA: Diagnosis not present

## 2021-08-25 DIAGNOSIS — I10 Essential (primary) hypertension: Secondary | ICD-10-CM | POA: Diagnosis not present

## 2021-08-25 DIAGNOSIS — M0609 Rheumatoid arthritis without rheumatoid factor, multiple sites: Secondary | ICD-10-CM

## 2021-08-25 DIAGNOSIS — K219 Gastro-esophageal reflux disease without esophagitis: Secondary | ICD-10-CM | POA: Diagnosis not present

## 2021-08-25 DIAGNOSIS — J45909 Unspecified asthma, uncomplicated: Secondary | ICD-10-CM | POA: Diagnosis not present

## 2021-08-25 DIAGNOSIS — F331 Major depressive disorder, recurrent, moderate: Secondary | ICD-10-CM | POA: Diagnosis not present

## 2021-08-25 DIAGNOSIS — E785 Hyperlipidemia, unspecified: Secondary | ICD-10-CM | POA: Diagnosis not present

## 2021-08-25 NOTE — Progress Notes (Signed)
Next infusion scheduled for Orencia IV on 09/21/21 and due for updated orders. Diagnosis: RA  Dose: 750mg  every 6 weeks (appropriate based on last recorded weight of 72.6 kg)  Last Clinic Visit: 05/09/21 Next Clinic Visit: 10/11/21  Last infusion: 08/10/21  Labs: CBC and CMP on 08/10/21 - wnl TB Gold: negative on 06/29/21   Orders placed for Orencia IV x 2 doses along with premedication of acetaminophen and diphenhydramine to be administered 30 minutes before medication infusion.  Standing CBC with diff/platelet and CMP with GFR orders placed to be drawn every 2 months.  Next TB gold due 06/29/22  Knox Saliva, PharmD, MPH, BCPS Clinical Pharmacist (Rheumatology and Pulmonology)

## 2021-09-06 NOTE — Progress Notes (Signed)
Assessment/Plan:   1.  Tremor  -pt has had lithium induced tremor in the past and that completely resolved off lithium  -Patient's Abilify induced tremor was gone today, after reducing dosage of Abilify.  Patient does think that the propranolol helps, but he had significant bradycardia on 20 mg twice per day.  Primary care has dropped that back to 20 mg once per day and patient thinks that it is helpful.  His blood pressure is still pretty low (asymptomatic), so we will need to keep a close eye on that.   2.  MCI  -Had neurocognitive testing with Dr. Melvyn Novas demonstrating MCI.  This was done in February, 2022.  Dr. Melvyn Novas recommended potentially repeating this in 12 to 18 months.  -Patient previously asked about Aducanumab.  Discussed research as well as the controversy around this medication.  Discussed indication and fairly weak science/data that led to FDA approval.  Also discussed the rates of cerebral edema/hemmorhage (Aria-E and Aria-H).  Have discussed that in November, 2020, a panel of experts concluded that a pivotal study involving this medication failed to  demonstrate "strong evidence" and multiple "red flags" with the data analysis.  Discussed cost associated with the medication.  Discussed that it would not be indicated in him right now anyway because he does not have a diagnosis of Alzheimer's dementia.  -Patient now off of Cogentin and clonazepam, which may have been contributing to memory change as well.  He has repeat testing scheduled for March and he is eager to see how he does off of medication  3.  Patient was given refills for 1 year.  Told him he can follow-up here or with his primary care physician.  He chose to come back in 1 year.  Will have him see our PA since doing well.  We will have him see her earlier if testing with Dr. Melvyn Novas reveals something degenerative. Subjective:   [name] [name] was seen today in follow up for tremor.  I saw the patient last visit, he had  Abilify induced tremor (previously had lithium induced tremor).  Patient very much wanted some medication for this.  Told patient I was not sure what helped, but we increased his propranolol so that he was taking 20 mg twice per day.  I also told him he needed to talk with his psychiatrist.  He last saw the nurse practitioner from psychiatry on September 6.  Those notes are reviewed.  He apparently reduced Abilify from 10 mg to 5 mg (although he thinks that was done a long time ago and forgot to update me).  He reported that tremor was better with increased dose of propranolol.  He does state that 2 weeks ago, he went to Wenda Low, MD because of fatigue.  His HR was in the 40's.  Propranolol was reduced to 20 mg daily.  He feels better.  No lightheadedness/dizziness.    Prior medications: Clonazepam, Cogentin; propranolol, 10 mg twice per day (on currently)  CURRENT MEDICATIONS:  Outpatient Encounter Medications as of 09/07/2021  Medication Sig   Abatacept (ORENCIA IV) Inject into the vein every 6 (six) weeks.   acetaminophen (TYLENOL) 325 MG tablet Take 650 mg by mouth every 6 (six) hours as needed.   albuterol (PROAIR HFA) 108 (90 Base) MCG/ACT inhaler INHALE 1-2 PUFFS EVERY 6 HOURS AS NEEDED FOR WHEEZING OR SHORTNESS OF BREATH   ARIPiprazole (ABILIFY) 5 MG tablet Take one and one half tablets daily. (Patient taking differently: Take one  tablets daily.)   fluticasone (FLONASE) 50 MCG/ACT nasal spray Place 1 spray into both nostrils daily.   Fluticasone-Umeclidin-Vilant (TRELEGY ELLIPTA) 100-62.5-25 MCG/INH AEPB INHALE ONE PUFF BY MOUTH DAILY   guaiFENesin (MUCUS RELIEF ADULT PO) Take by mouth daily.   loratadine (CLARITIN) 10 MG tablet Take 10 mg by mouth daily.   losartan-hydrochlorothiazide (HYZAAR) 100-25 MG tablet Take 1 tablet by mouth daily.    montelukast (SINGULAIR) 10 MG tablet TAKE ONE TABLET BY MOUTH DAILY   nystatin (MYCOSTATIN) 100000 UNIT/ML suspension Take 5 mLs by mouth daily.  prn   Omeprazole (PRILOSEC PO) Take by mouth 2 (two) times daily.    Probiotic Product (PROBIOTIC PO) Take 1 tablet by mouth daily.    propranolol (INDERAL) 20 MG tablet Take 1 tablet (20 mg total) by mouth 2 (two) times daily. (Patient taking differently: Take 20 mg by mouth daily.)   [DISCONTINUED] pantoprazole (PROTONIX) 20 MG tablet Take 20 mg by mouth 2 (two) times daily.   No facility-administered encounter medications on file as of 09/07/2021.     Objective:   PHYSICAL EXAMINATION:    VITALS:   Vitals:   09/07/21 1518  BP: 98/62  Pulse: (!) 55  SpO2: 99%  Weight: 160 lb 9.6 oz (72.8 kg)  Height: 5\' 8"  (1.727 m)     GEN:  The patient appears stated age and is in NAD. HEENT:  Normocephalic, atraumatic.  The mucous membranes are moist. The superficial temporal arteries are without ropiness or tenderness. CV:  RRR Lungs:  CTAB Neck/HEME:  There are no carotid bruits bilaterally.  Neurological examination:  Orientation: The patient is alert and oriented x3. Cranial nerves: There is good facial symmetry. The speech is fluent and clear. Soft palate rises symmetrically and there is no tongue deviation. Hearing is intact to conversational tone. Sensation: Sensation is intact to light touch throughout Motor: Strength is 5/5 in the bilateral upper and lower extremities.   Shoulder shrug is equal and symmetric.  There is no pronator drift.  Movement examination: Tone: There is normal tone in the RUE.  There is normal tone in the LUE.  There is normal tone in the RLE.  There is normal tone in the LLE.  Abnormal movements: No rest tremor.  No postural tremor. Coordination:  There is no decremation with RAM's, with any form of RAMS, including alternating supination and pronation of the forearm, hand opening and closing, finger taps, heel taps and toe taps. Gait and Station: The patient has no difficulty arising out of a deep-seated chair without the use of the hands. The patient's  stride length is good.      Cc:  Wenda Low, MD

## 2021-09-07 ENCOUNTER — Encounter: Payer: Self-pay | Admitting: Neurology

## 2021-09-07 ENCOUNTER — Other Ambulatory Visit: Payer: Self-pay

## 2021-09-07 ENCOUNTER — Ambulatory Visit (INDEPENDENT_AMBULATORY_CARE_PROVIDER_SITE_OTHER): Payer: Medicare Other | Admitting: Neurology

## 2021-09-07 VITALS — BP 98/62 | HR 55 | Ht 68.0 in | Wt 160.6 lb

## 2021-09-07 DIAGNOSIS — R251 Tremor, unspecified: Secondary | ICD-10-CM | POA: Diagnosis not present

## 2021-09-07 DIAGNOSIS — G3184 Mild cognitive impairment, so stated: Secondary | ICD-10-CM

## 2021-09-07 MED ORDER — PROPRANOLOL HCL 20 MG PO TABS: 20.0000 mg | ORAL_TABLET | Freq: Every day | ORAL | 3 refills | Status: DC

## 2021-09-21 ENCOUNTER — Other Ambulatory Visit: Payer: Self-pay

## 2021-09-21 ENCOUNTER — Ambulatory Visit (HOSPITAL_COMMUNITY)
Admission: RE | Admit: 2021-09-21 | Discharge: 2021-09-21 | Disposition: A | Discharge status: Home / Self Care | Payer: Medicare Other | Source: Ambulatory Visit | Attending: Rheumatology | Admitting: Rheumatology

## 2021-09-21 DIAGNOSIS — M0609 Rheumatoid arthritis without rheumatoid factor, multiple sites: Secondary | ICD-10-CM | POA: Diagnosis not present

## 2021-09-21 MED ORDER — DIPHENHYDRAMINE HCL 25 MG PO CAPS
ORAL_CAPSULE | ORAL | Status: AC
  Administered 2021-09-21: 25 mg via ORAL
  Filled 2021-09-21: qty 1

## 2021-09-21 MED ORDER — SODIUM CHLORIDE 0.9 % IV SOLN
750.0000 mg | INTRAVENOUS | Status: DC
  Administered 2021-09-21: 750 mg via INTRAVENOUS
  Filled 2021-09-21: qty 30

## 2021-09-21 MED ORDER — ACETAMINOPHEN 325 MG PO TABS
ORAL_TABLET | ORAL | Status: AC
  Administered 2021-09-21: 650 mg via ORAL
  Filled 2021-09-21: qty 2

## 2021-09-21 MED ORDER — DIPHENHYDRAMINE HCL 25 MG PO CAPS: 25.0000 mg | ORAL_CAPSULE | ORAL | Status: DC

## 2021-09-21 MED ORDER — ACETAMINOPHEN 325 MG PO TABS: 650.0000 mg | ORAL_TABLET | ORAL | Status: DC

## 2021-09-27 NOTE — Progress Notes (Deleted)
Office Visit Note  Patient: [name] [name]             Date of Birth: 05/23/1951           MRN: [id number]             PCP: Wenda Low, MD Referring: Wenda Low, MD Visit Date: 10/11/2021 Occupation: @GUAROCC @  Subjective:  No chief complaint on file.   History of Present Illness: [name] [name] is a 70 y.o. male ***   Activities of Daily Living:  Patient reports morning stiffness for *** {minute/hour:19697}.   Patient {ACTIONS;DENIES/REPORTS:21021675::"Denies"} nocturnal pain.  Difficulty dressing/grooming: {ACTIONS;DENIES/REPORTS:21021675::"Denies"} Difficulty climbing stairs: {ACTIONS;DENIES/REPORTS:21021675::"Denies"} Difficulty getting out of chair: {ACTIONS;DENIES/REPORTS:21021675::"Denies"} Difficulty using hands for taps, buttons, cutlery, and/or writing: {ACTIONS;DENIES/REPORTS:21021675::"Denies"}  No Rheumatology ROS completed.   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59/ 110%, R 0.81    Asthmatic bronchitis with acute exacerbation 10/15/2016   Coarse tremors 12/29/2019   Upper extremities, bilateral   Diplopia 12/29/2019   Generalized anxiety disorder    GERD (gastroesophageal reflux disease) 12/24/2007   Major depressive disorder in remission 07/15/2017   Mild neurocognitive disorder 09/14/2020   Polyp of nasal cavity 12/24/2007   Regular astigmatism of both eyes 12/29/2019   Rheumatoid arthritis of multiple sites without rheumatoid factor 09/18/2010   Seasonal and perennial allergic rhinitis 12/24/2007   Sinusitis, chronic 12/24/2007   Recurrent acute maxillary and frontal sinusitis     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    Family History  Problem Relation Age of Onset   Multiple sclerosis Father    Breast cancer Mother    Breast cancer Sister    Alcoholism Child        now sober   Past Surgical History:  Procedure Laterality Date   CARPAL TUNNEL RELEASE Left    extensive sinus surgery with ablation of the frontal sinuses     HERNIA REPAIR     MOUTH SURGERY  11/2019   nasal polypectomies     TOOTH EXTRACTION     Social History   Social History Narrative   Right handed    Lives alone    Immunization History  Administered Date(s) Administered   Influenza Split 07/04/2009, 08/02/2011, 07/08/2012, 09/07/2012, 07/06/2013, 07/14/2015, 07/08/2016, 07/04/2020   Influenza, High Dose Seasonal PF 06/19/2017, 06/13/2018, 06/18/2019, 08/24/2019   Influenza,inj,Quad PF,6+ Mos 07/15/2014, 06/19/2017   Influenza-Unspecified 07/25/2016   PFIZER(Purple Top)SARS-COV-2 Vaccination 11/20/2019, 12/15/2019, 06/17/2020   Pneumococcal Conjugate-13 05/26/2018   Pneumococcal Polysaccharide-23 05/27/2006, 07/15/2014   Td 03/06/2007   Tdap 08/23/2012   Zoster Recombinat (Shingrix) 05/23/2018, 08/21/2018  Zoster, Live 04/24/2016, 05/20/2018, 07/30/2018     Objective: Vital Signs: There were no vitals taken for this visit.    Physical Exam   Musculoskeletal Exam: ***  CDAI Exam: CDAI Score: -- Patient Global: --; Provider Global: -- Swollen: --; Tender: -- Joint Exam 10/11/2021   No joint exam has been documented for this visit   There is currently no information documented on the homunculus. Go to the Rheumatology activity and complete the homunculus joint exam.  Investigation: No additional findings.  Imaging: No results found.  Recent Labs: Lab Results  Component Value Date   WBC 5.1 08/10/2021   HGB 13.0 08/10/2021   PLT 276 08/10/2021   NA 138 08/10/2021   K 3.7 08/10/2021   CL 102 08/10/2021   CO2 28 08/10/2021   GLUCOSE 90 08/10/2021   BUN 16 08/10/2021   CREATININE 1.05 08/10/2021   BILITOT 0.3 08/10/2021   ALKPHOS 60 08/10/2021   AST 17 08/10/2021   ALT 14 08/10/2021   PROT 7.0 08/10/2021   ALBUMIN 3.6 08/10/2021   CALCIUM 9.2 08/10/2021   GFRAA >60 05/23/2020   QFTBGOLD NEGATIVE 07/16/2017   QFTBGOLDPLUS Negative 06/29/2021    Speciality Comments: No specialty comments available.  Procedures:  No procedures performed Allergies: Simponi [golimumab], Aspirin, Other, and Theophyllines   Assessment / Plan:     Visit Diagnoses: No diagnosis found.  Orders: No orders of the defined types were placed in this encounter.  No orders of the defined types were placed in this encounter.   Face-to-face time spent with patient was *** minutes. Greater than 50% of time was spent in counseling and coordination of care.  Follow-Up Instructions: No follow-ups on file.   Earnestine Mealing, CMA  Note - This record has been created using Editor, commissioning.  Chart creation errors have been sought, but may not always  have been located. Such creation errors do not reflect on  the standard of medical care.

## 2021-10-03 ENCOUNTER — Telehealth: Payer: Self-pay | Admitting: Internal Medicine

## 2021-10-03 IMAGING — CT CT HEAD W/O CM
1 series · 16 of 30 positions shown, 20 images · non-contrast
Comparison: MRI from 11/11/2011

CLINICAL DATA: Memory loss and confusion

EXAM:
CT HEAD WITHOUT CONTRAST
TECHNIQUE: Contiguous axial images were obtained from the base of the skull
through the vertex without intravenous contrast.

[Series 2: head w/(date) · axial · 0.46mm/px · z∈[-177,-17]mm · 16 of 36 slices shown, 20 images]
[im 2/36  brain]
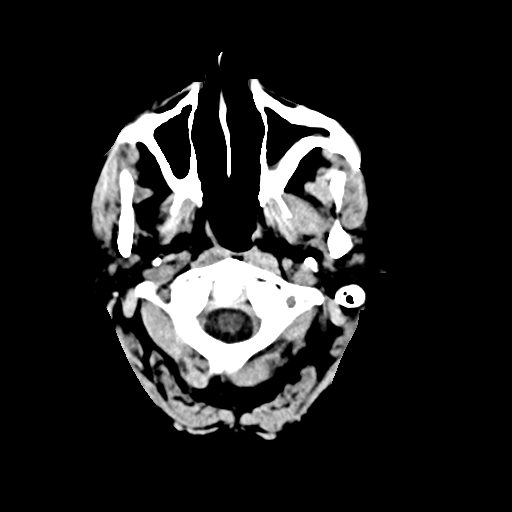
[im 2/36  bone]
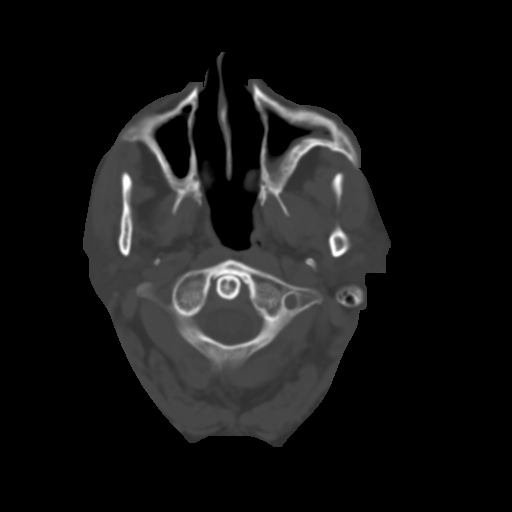
[im 4/36  brain]
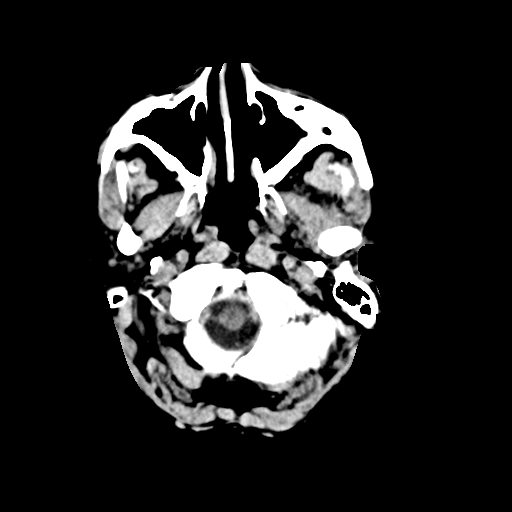
[im 7/36  brain]
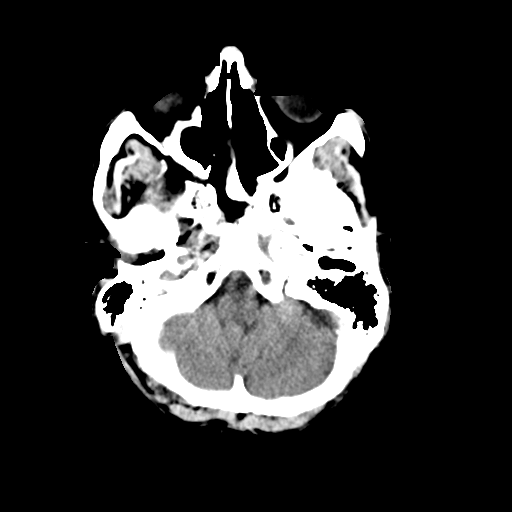
[im 9/36  brain]
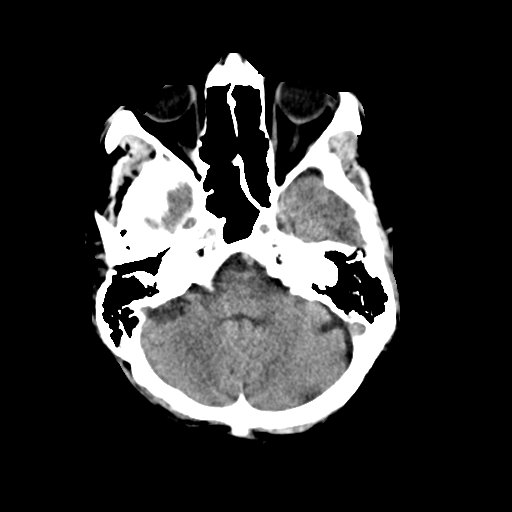
[im 10/36  brain]
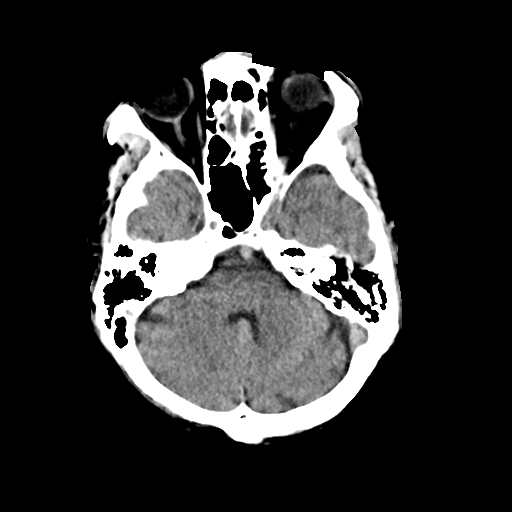
[im 10/36  bone]
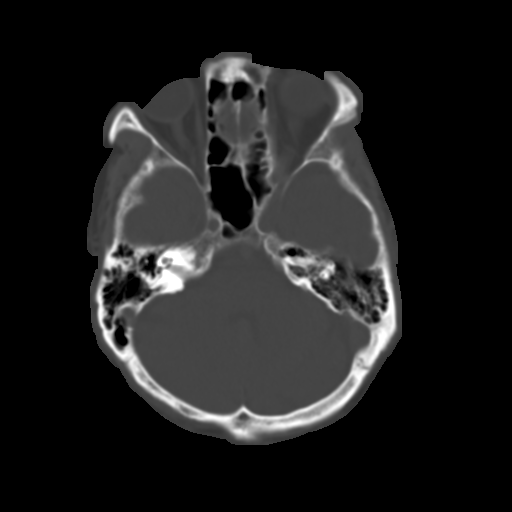
[im 13/36  brain]
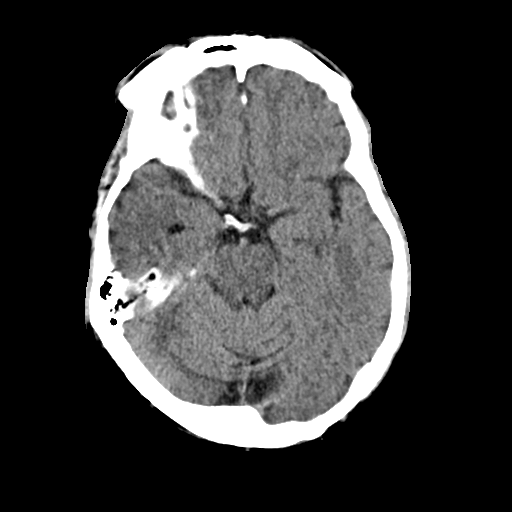
[im 15/36  brain]
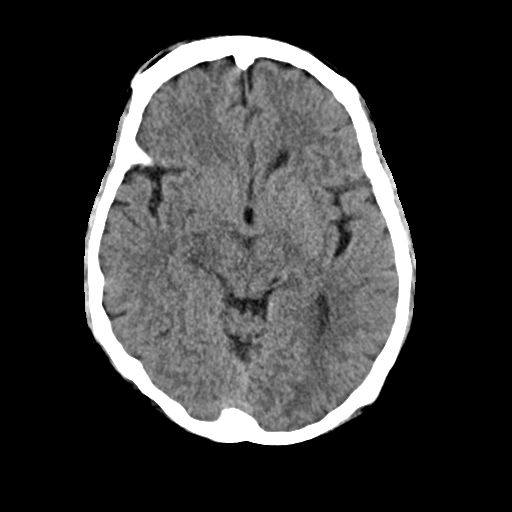
[im 17/36  brain]
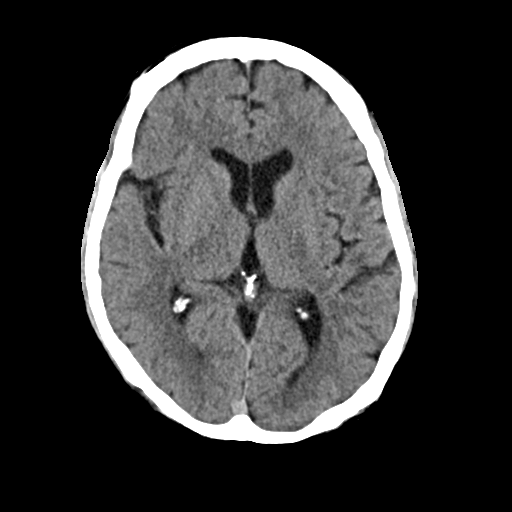
[im 19/36  brain]
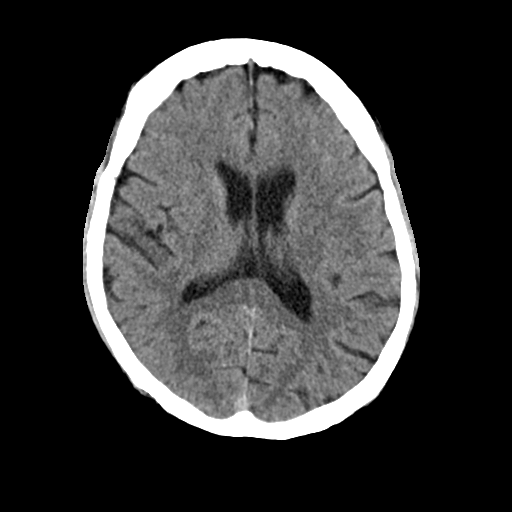
[im 19/36  bone]
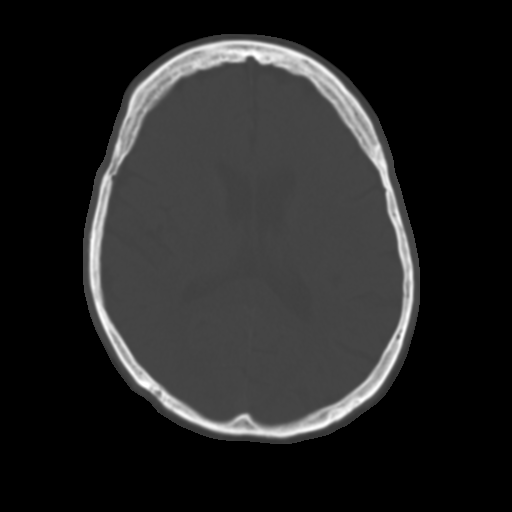
[im 21/36  brain]
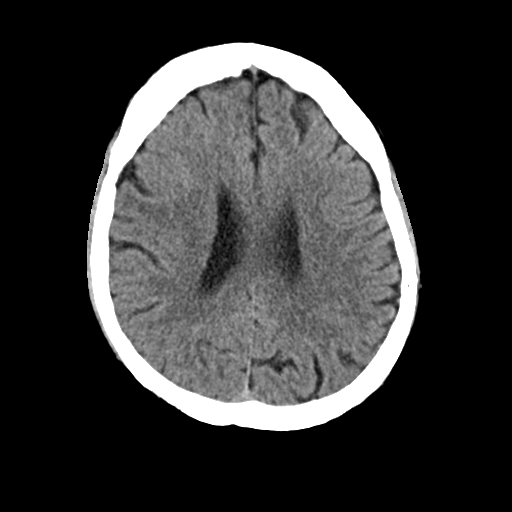
[im 23/36  brain]
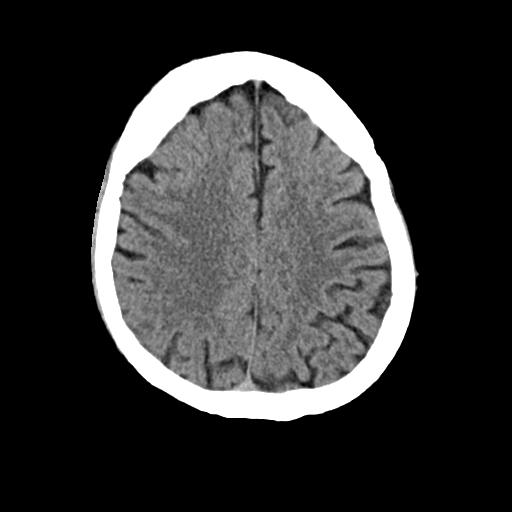
[im 26/36  brain]
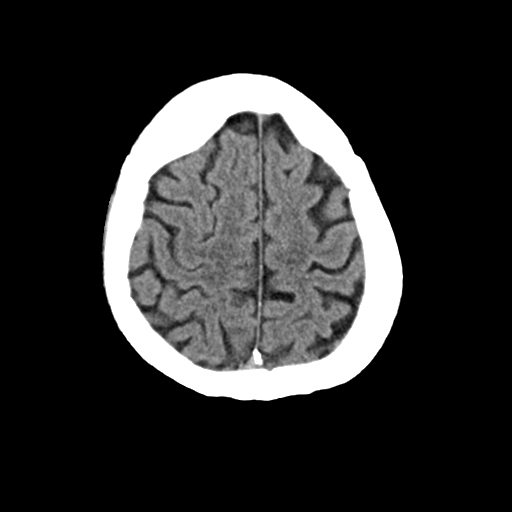
[im 27/36  brain]
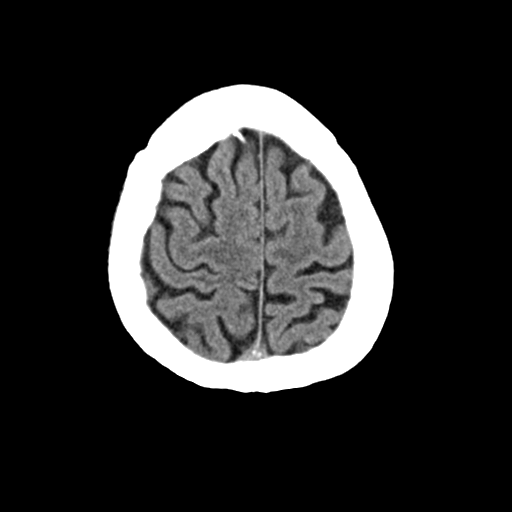
[im 27/36  bone]
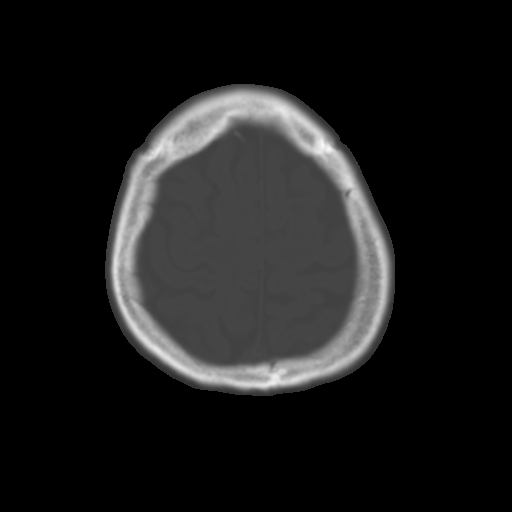
[im 29/36  brain]
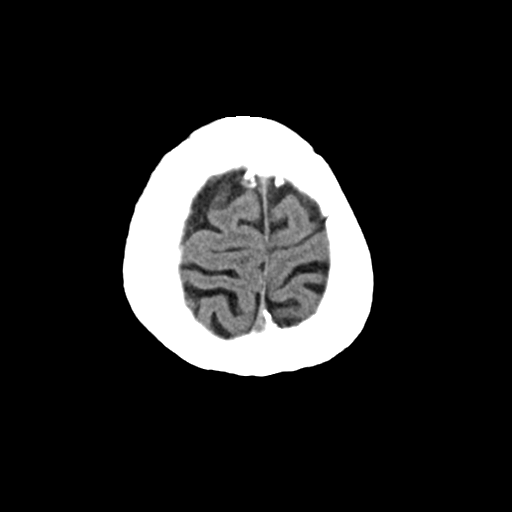
[im 32/36  brain]
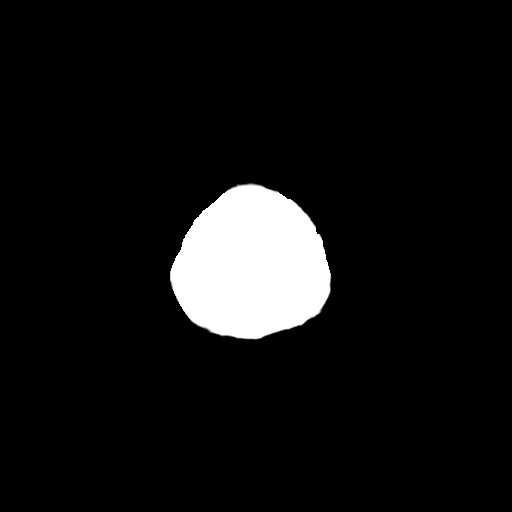
[im 34/36  brain]
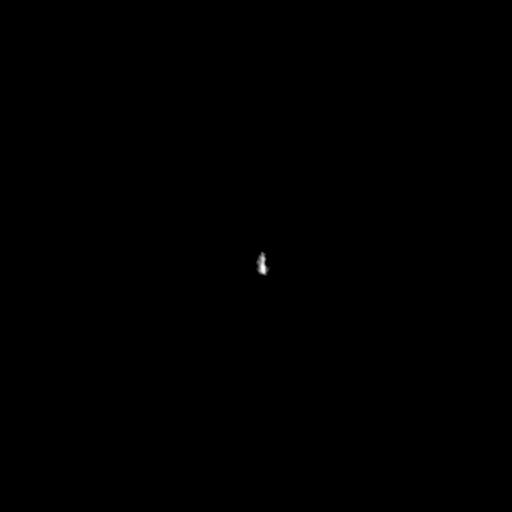

[16 of 30 positions shown; findings below may reference images not displayed]

FINDINGS: Brain: No evidence of acute infarction, hemorrhage, hydrocephalus,
extra-axial collection or mass lesion/mass effect.

Vascular: No hyperdense vessel or unexpected calcification.

Skull: Normal. Negative for fracture or focal lesion.

Sinuses/Orbits: Postsurgical changes in the maxillary antra are
noted bilaterally.

Other: None
IMPRESSION: No acute intracranial abnormality noted.

## 2021-10-03 NOTE — Telephone Encounter (Signed)
Called and spoke with patient who states that he has a lot of congestion, Coughing up green mucus, he's weak, experiencing SOB. States that all this started 3 days ago. Denies any fevers and has not done covid or flu test. Wants to know if he could get something to help with this  [facility]: [facility] [place]   Dr. Annamaria Boots please advise

## 2021-10-03 NOTE — Telephone Encounter (Signed)
Ok to send in [place] and recommend Mucinex. He should also get flu and covid tested.

## 2021-10-04 MED ORDER — AZITHROMYCIN 250 MG PO TABS: 250.0000 mg | ORAL_TABLET | ORAL | 0 refills | Status: DC

## 2021-10-04 NOTE — Telephone Encounter (Signed)
Spoke with the pt and notified of response per Dr Annamaria Boots  He verbalized understanding  Will call if pos for flu or covid  Zpack sent  Nothing further needed

## 2021-10-05 DIAGNOSIS — Z8616 Personal history of COVID-19: Secondary | ICD-10-CM | POA: Insufficient documentation

## 2021-10-05 DIAGNOSIS — R051 Acute cough: Secondary | ICD-10-CM | POA: Diagnosis not present

## 2021-10-05 DIAGNOSIS — U071 COVID-19: Secondary | ICD-10-CM | POA: Diagnosis not present

## 2021-10-05 HISTORY — DX: Personal history of COVID-19: Z86.16

## 2021-10-11 ENCOUNTER — Telehealth: Payer: Self-pay | Admitting: Internal Medicine

## 2021-10-11 ENCOUNTER — Ambulatory Visit: Payer: Medicare Other | Admitting: Rheumatology

## 2021-10-11 NOTE — Telephone Encounter (Signed)
Called and spoke with patient. He stated that he was diagnosed with COVID back on 12/27 at [facility]. He was prescribed Paxlovid and was able to tolerate the medication well. He is feeling much better now and just wanted to let CY know. I advised him that I would send a message to him to let him know.   Nothing further needed at time of call.

## 2021-10-17 NOTE — Progress Notes (Signed)
Office Visit Note  Patient: [name] [name]             Date of Birth: 1951-03-03           MRN: [id number]             PCP: Wenda Low, MD Referring: Wenda Low, MD Visit Date: 10/31/2021 Occupation: @GUAROCC @  Subjective:  Pain in both shoulders   History of Present Illness: [name] [name] is a 71 y.o. male with history of seronegative rheumatoid arthritis and osteoarthritis.  He is receiving IV orencia 750 mg infusions every 6 weeks. His last infusion was on 09/21/21.  He was diagnosed with COVID-19 on 10/05/2021 at which time he was evaluated at urgent care and prescribed paxlovid.  He notified his pulmonologist but did not require follow up at that time. His symptoms have completely resolved.  Patient reports that he has been having intermittent discomfort in both shoulder joints especially with range of motion or when lying on his sides at night.  His discomfort initially started about 6 months ago and has been improving gradually.  He denies any injury prior to the onset of symptoms.  His shoulder pain usually improves after the Orencia infusions.  He has not needed to take any over-the-counter products for symptomatic relief.  He denies any other joint pain or joint swelling at this time.  His next infusion is scheduled on Thursday.    Activities of Daily Living:  Patient reports morning stiffness for a few minutes.   Patient Denies nocturnal pain.  Difficulty dressing/grooming: Reports shoulder pain when putting on jacket Difficulty climbing stairs: Denies Difficulty getting out of chair: Reports Difficulty using hands for taps, buttons, cutlery, and/or writing: Reports  Review of Systems  Constitutional:  Negative for fatigue and night sweats.  HENT:  Negative for mouth sores, mouth dryness and nose dryness.   Eyes:  Negative for redness and dryness.  Respiratory:  Negative for shortness of breath and difficulty breathing.   Cardiovascular:  Negative for chest  pain, palpitations, hypertension, irregular heartbeat and swelling in legs/feet.  Gastrointestinal:  Negative for constipation and diarrhea.  Endocrine: Negative for increased urination.  Genitourinary:  Negative for painful urination.       Urinary urgency  Musculoskeletal:  Positive for joint pain, joint pain and morning stiffness. Negative for joint swelling, myalgias, muscle weakness, muscle tenderness and myalgias.  Skin:  Negative for color change, rash, nodules/bumps, skin tightness, ulcers and sensitivity to sunlight.  Allergic/Immunologic: Negative for susceptible to infections.  Neurological:  Positive for dizziness. Negative for memory loss, night sweats and weakness.  Hematological:  Negative for swollen glands.  Psychiatric/Behavioral:  Negative for depressed mood and sleep disturbance. The patient is not nervous/anxious.    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   Asthmatic bronchitis, mild intermittent, uncomplicated 22/29/7989   Asthmatic bronchitis with acute exacerbation 10/15/2016   Herpes zoster 08/01/2013   Rheumatoid arthritis of multiple sites without rheumatoid factor ([place]) 09/18/2010  Polyp of nasal cavity 12/24/2007   Sinusitis, chronic 12/24/2007   Seasonal and perennial allergic rhinitis 12/24/2007   Allergic-infective asthma 12/24/2007   GERD (gastroesophageal reflux disease) 12/24/2007    Past Medical History:  Diagnosis Date    Allergic-infective asthma 12/24/2007   FENO- 08/07/16- 25 Office spirometry 08/07/16- WNL, FEV1 3.59/ 110%, R 0.81    Asthmatic bronchitis with acute exacerbation 10/15/2016   Coarse tremors 12/29/2019   Upper extremities, bilateral   Diplopia 12/29/2019   Generalized anxiety disorder    GERD (gastroesophageal reflux disease) 12/24/2007   Major depressive disorder in remission 07/15/2017   Mild neurocognitive disorder 09/14/2020   Polyp of nasal cavity 12/24/2007   Regular astigmatism of both eyes 12/29/2019   Rheumatoid arthritis of multiple sites without rheumatoid factor 09/18/2010   Seasonal and perennial allergic rhinitis 12/24/2007   Sinusitis, chronic 12/24/2007   Recurrent acute maxillary and frontal sinusitis     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    Family History  Problem Relation Age of Onset   Multiple sclerosis Father    Breast cancer Mother    Breast cancer Sister    Alcoholism Child        now sober   Past Surgical History:  Procedure Laterality Date   CARPAL TUNNEL RELEASE Left    extensive sinus surgery with ablation of the frontal sinuses     HERNIA REPAIR     MOUTH SURGERY  11/2019   nasal polypectomies     TOOTH EXTRACTION     Social History   Social History Narrative   Right handed    Lives alone    Immunization History  Administered Date(s) Administered   Influenza Split 07/04/2009, 08/02/2011, 07/08/2012, 09/07/2012, 07/06/2013, 07/14/2015, 07/08/2016, 07/04/2020   Influenza, High Dose Seasonal PF 06/19/2017, 06/13/2018, 06/18/2019, 08/24/2019   Influenza,inj,Quad PF,6+ Mos 07/15/2014, 06/19/2017   Influenza-Unspecified 07/25/2016   PFIZER(Purple Top)SARS-COV-2 Vaccination 11/20/2019, 12/15/2019, 06/17/2020   Pneumococcal Conjugate-13 05/26/2018   Pneumococcal Polysaccharide-23 05/27/2006, 07/15/2014   Td 03/06/2007   Tdap 08/23/2012   Zoster Recombinat (Shingrix) 05/23/2018, 08/21/2018   Zoster, Live 04/24/2016,  05/20/2018, 07/30/2018     Objective: Vital Signs: BP 125/80    Pulse (!) 54    Ht 5\' 8"  (1.727 m)    Wt 160 lb (72.6 kg)    BMI 24.33 kg/m    Physical Exam Vitals and nursing note reviewed.  Constitutional:      Appearance: He is well-developed.  HENT:     Head: Normocephalic and atraumatic.  Eyes:     Conjunctiva/sclera: Conjunctivae normal.     Pupils: Pupils are equal, round, and reactive to light.  Pulmonary:     Effort: Pulmonary effort is normal.  Abdominal:     Palpations: Abdomen is soft.  Musculoskeletal:     Cervical back: Normal range of motion and neck supple.  Skin:    General: Skin is warm and dry.     Capillary Refill: Capillary refill takes less than 2 seconds.  Neurological:     Mental Status: He is alert and oriented to person, place, and time.  Psychiatric:        Behavior: Behavior normal.     Musculoskeletal Exam: C-spine has limited range of motion with lateral rotation. Painful ROM of both shoulder joints.  Elbow joints have good range of motion with no tenderness or inflammation.  Wrist joints have good range of motion with no tenderness or inflammation.  PIP and DIP thickening consistent with osteoarthritis of both  hands.  Ulnar deviation with synovial thickening of MCP joints noted.  No synovitis or tenderness over MCP or PIP joints noted.  Incomplete extension of left hand MCPs and PIP joints.  Hip joints have good range of motion with no groin pain.  Knee joints have good range of motion with no warmth or effusion.  Ankle joints have good range of motion with no tenderness or joint swelling.  CDAI Exam: CDAI Score: 2.2  Patient Global: 1 mm; Provider Global: 1 mm Swollen: 0 ; Tender: 2  Joint Exam 10/31/2021      Right  Left  Glenohumeral   Tender   Tender     Investigation: No additional findings.  Imaging: No results found.  Recent Labs: Lab Results  Component Value Date   WBC 5.1 08/10/2021   HGB 13.0 08/10/2021   PLT 276  08/10/2021   NA 138 08/10/2021   K 3.7 08/10/2021   CL 102 08/10/2021   CO2 28 08/10/2021   GLUCOSE 90 08/10/2021   BUN 16 08/10/2021   CREATININE 1.05 08/10/2021   BILITOT 0.3 08/10/2021   ALKPHOS 60 08/10/2021   AST 17 08/10/2021   ALT 14 08/10/2021   PROT 7.0 08/10/2021   ALBUMIN 3.6 08/10/2021   CALCIUM 9.2 08/10/2021   GFRAA >60 05/23/2020   QFTBGOLD NEGATIVE 07/16/2017   QFTBGOLDPLUS Negative 06/29/2021    Speciality Comments: No specialty comments available.  Procedures:  No procedures performed Allergies: Simponi [golimumab], Aminophylline, Aspirin, Other, and Theophyllines   Assessment / Plan:     Visit Diagnoses: Rheumatoid arthritis of multiple sites without rheumatoid factor ([place]): He has no synovitis on examination today.  He presents with discomfort in both shoulder joints which initially started 6 months ago.  He has not had any injury prior to the onset of symptoms.  His discomfort is exacerbated by internal rotation and lying on his sides at night.  His discomfort has gradually been improving and typically improves after his Orencia infusions.  He declined updated x-rays at this time.  His pain has not been severe enough to take over-the-counter products.  He declined a cortisone injection at this time.  He declined referral to physical therapy.  He was given a handout of home exercises to perform.  He is not experiencing other joint pain or inflammation at this time.  Overall his rheumatoid arthritis has been well controlled on Orencia 750 mg IV infusions every 6 weeks.  His next infusion is scheduled on 11/02/2021.  He was advised to notify us if he develops signs or symptoms of a flare.  He will follow-up in the office in 5 months.  High risk medication use - Orencia 750 mg IV infusions every 6 weeks-spacing orencia due to recurrent infections. His next infusion is scheduled on 11/02/2021.  CBC and CMP updated on 08/10/21.  He will be having updated lab work with his  infusion on Thursday.  TB gold negative on 06/29/21.  He was diagnosed with COVID-19 on 09/21/2021.  He was prescribed Paxlovid at urgent care which he tolerated without any side effects.  He notified his pulmonologist Dr. Annamaria Boots about being diagnosed with COVID-19 but did not require follow-up.  His symptoms have completely resolved.  He did not need to hold or postpone his Orencia infusion due to timing.  His next infusion is scheduled on Thursday.  Pain in both hands -  X-rays were updated on 02/07/21 and are consistent with rheumatoid arthritis and osteoarthritis.  There was no radiographic  progression when compared to the x-rays of 2019.    Primary osteoarthritis of both hands - He has severe osteoarthritis in his bilateral hands.  He has PIP and DIP thickening consistent with osteoarthritis of both hands.  Ulnar deviation noted.  No tenderness or synovitis was noted on examination today.  He was able to make a complete fist bilaterally.  Discussed the importance of joint protection and muscle strengthening.  Pain in both feet - X-rays were updated on 02/07/2021 and were consistent with osteoarthritis.  No radiographic progression was noted when compared to the x-rays of 2021.  He has not experiencing any discomfort in his feet at this time.  He is good range of motion of both ankle joints with no tenderness or joint swelling.  Chronic pain of both shoulders: He presents today with ongoing discomfort in both shoulder joints which initially started about 6 months ago.  He did not have any injury prior to the onset of symptoms.  His discomfort is exacerbated by internal rotation and lying on his sides at night.  He has not needed to take over-the-counter products for symptomatic relief.  His discomfort typically improves after emergency infusions.  He declined updated x-rays as well as a cortisone injection at this time.  He also declined referral to physical therapy.  He was given a handout of home exercises  to perform.  He was advised to notify us if his discomfort persists or worsens.  Other medical conditions are listed as follows:  Coarse tremors  History of gastroesophageal reflux (GERD)  History of depression  History of asthma  Family history of MS (multiple sclerosis) - he has been followed by the neurologist.  Memory loss - His neurologist is concerned about possible dementia.  He was taken off the antidepressants for that reason.  Orders: No orders of the defined types were placed in this encounter.  No orders of the defined types were placed in this encounter.    Follow-Up Instructions: Return in about 5 months (around 03/31/2022) for Rheumatoid arthritis.   Ofilia Neas, PA-C  Note - This record has been created using Dragon software.  Chart creation errors have been sought, but may not always  have been located. Such creation errors do not reflect on  the standard of medical care.

## 2021-10-20 ENCOUNTER — Telehealth: Payer: Self-pay | Admitting: Internal Medicine

## 2021-10-20 NOTE — Telephone Encounter (Signed)
Called and spoke with patient who states he has been positive for covid for two weeks. States he feels good but just positive. Advised the patient that he can potentially test positive for up to 3 months. Told him that once he is past the 10 day window and does not have any fevers he is not contagious. Advised him to continue to wear a mask until he gets a negative test and also if his symptoms changed or he starts feeling bad to call and let us know. Patient expressed understanding. Nothing further needed at this time.

## 2021-10-31 ENCOUNTER — Other Ambulatory Visit: Payer: Self-pay

## 2021-10-31 ENCOUNTER — Encounter: Payer: Self-pay | Admitting: Physician Assistant

## 2021-10-31 ENCOUNTER — Ambulatory Visit: Payer: Medicare Other | Admitting: Physician Assistant

## 2021-10-31 VITALS — BP 125/80 | HR 54 | Ht 68.0 in | Wt 160.0 lb

## 2021-10-31 DIAGNOSIS — M25512 Pain in left shoulder: Secondary | ICD-10-CM

## 2021-10-31 DIAGNOSIS — Z820 Family history of epilepsy and other diseases of the nervous system: Secondary | ICD-10-CM

## 2021-10-31 DIAGNOSIS — G8929 Other chronic pain: Secondary | ICD-10-CM

## 2021-10-31 DIAGNOSIS — M25511 Pain in right shoulder: Secondary | ICD-10-CM

## 2021-10-31 DIAGNOSIS — Z79899 Other long term (current) drug therapy: Secondary | ICD-10-CM

## 2021-10-31 DIAGNOSIS — M0609 Rheumatoid arthritis without rheumatoid factor, multiple sites: Secondary | ICD-10-CM

## 2021-10-31 DIAGNOSIS — G252 Other specified forms of tremor: Secondary | ICD-10-CM

## 2021-10-31 DIAGNOSIS — E237 Disorder of pituitary gland, unspecified: Secondary | ICD-10-CM | POA: Insufficient documentation

## 2021-10-31 DIAGNOSIS — Z8659 Personal history of other mental and behavioral disorders: Secondary | ICD-10-CM

## 2021-10-31 DIAGNOSIS — M79671 Pain in right foot: Secondary | ICD-10-CM

## 2021-10-31 DIAGNOSIS — Z8719 Personal history of other diseases of the digestive system: Secondary | ICD-10-CM

## 2021-10-31 DIAGNOSIS — M79641 Pain in right hand: Secondary | ICD-10-CM

## 2021-10-31 DIAGNOSIS — M79672 Pain in left foot: Secondary | ICD-10-CM

## 2021-10-31 DIAGNOSIS — M79642 Pain in left hand: Secondary | ICD-10-CM

## 2021-10-31 DIAGNOSIS — F319 Bipolar disorder, unspecified: Secondary | ICD-10-CM | POA: Insufficient documentation

## 2021-10-31 DIAGNOSIS — M19041 Primary osteoarthritis, right hand: Secondary | ICD-10-CM

## 2021-10-31 DIAGNOSIS — Z8709 Personal history of other diseases of the respiratory system: Secondary | ICD-10-CM

## 2021-10-31 DIAGNOSIS — M19042 Primary osteoarthritis, left hand: Secondary | ICD-10-CM

## 2021-10-31 DIAGNOSIS — R413 Other amnesia: Secondary | ICD-10-CM

## 2021-10-31 HISTORY — DX: Disorder of pituitary gland, unspecified: E23.7

## 2021-10-31 NOTE — Patient Instructions (Signed)
Shoulder Exercises Ask your health care provider which exercises are safe for you. Do exercises exactly as told by your health care provider and adjust them as directed. It is normal to feel mild stretching, pulling, tightness, or discomfort as you do these exercises. Stop right away if you feel sudden pain or your pain gets worse. Do not begin these exercises until told by your health care provider. Stretching exercises External rotation and abduction This exercise is sometimes called corner stretch. This exercise rotates your arm outward (external rotation) and moves your arm out from your body (abduction). Stand in a doorway with one of your feet slightly in front of the other. This is called a staggered stance. If you cannot reach your forearms to the door frame, stand facing a corner of a room. Choose one of the following positions as told by your health care provider: Place your hands and forearms on the door frame above your head. Place your hands and forearms on the door frame at the height of your head. Place your hands on the door frame at the height of your elbows. Slowly move your weight onto your front foot until you feel a stretch across your chest and in the front of your shoulders. Keep your head and chest upright and keep your abdominal muscles tight. Hold for __________ seconds. To release the stretch, shift your weight to your back foot. Repeat __________ times. Complete this exercise __________ times a day. Extension, standing Stand and hold a broomstick, a cane, or a similar object behind your back. Your hands should be a little wider than shoulder width apart. Your palms should face away from your back. Keeping your elbows straight and your shoulder muscles relaxed, move the stick away from your body until you feel a stretch in your shoulders (extension). Avoid shrugging your shoulders while you move the stick. Keep your shoulder blades tucked down toward the middle of your  back. Hold for __________ seconds. Slowly return to the starting position. Repeat __________ times. Complete this exercise __________ times a day. Range-of-motion exercises Pendulum  Stand near a wall or a surface that you can hold onto for balance. Bend at the waist and let your left / right arm hang straight down. Use your other arm to support you. Keep your back straight and do not lock your knees. Relax your left / right arm and shoulder muscles, and move your hips and your trunk so your left / right arm swings freely. Your arm should swing because of the motion of your body, not because you are using your arm or shoulder muscles. Keep moving your hips and trunk so your arm swings in the following directions, as told by your health care provider: Side to side. Forward and backward. In clockwise and counterclockwise circles. Continue each motion for __________ seconds, or for as long as told by your health care provider. Slowly return to the starting position. Repeat __________ times. Complete this exercise __________ times a day. Shoulder flexion, standing  Stand and hold a broomstick, a cane, or a similar object. Place your hands a little more than shoulder width apart on the object. Your left / right hand should be palm up, and your other hand should be palm down. Keep your elbow straight and your shoulder muscles relaxed. Push the stick up with your healthy arm to raise your left / right arm in front of your body, and then over your head until you feel a stretch in your shoulder (flexion). Avoid   shrugging your shoulder while you raise your arm. Keep your shoulder blade tucked down toward the middle of your back. Hold for __________ seconds. Slowly return to the starting position. Repeat __________ times. Complete this exercise __________ times a day. Shoulder abduction, standing Stand and hold a broomstick, a cane, or a similar object. Place your hands a little more than shoulder  width apart on the object. Your left / right hand should be palm up, and your other hand should be palm down. Keep your elbow straight and your shoulder muscles relaxed. Push the object across your body toward your left / right side. Raise your left / right arm to the side of your body (abduction) until you feel a stretch in your shoulder. Do not raise your arm above shoulder height unless your health care provider tells you to do that. If directed, raise your arm over your head. Avoid shrugging your shoulder while you raise your arm. Keep your shoulder blade tucked down toward the middle of your back. Hold for __________ seconds. Slowly return to the starting position. Repeat __________ times. Complete this exercise __________ times a day. Internal rotation  Place your left / right hand behind your back, palm up. Use your other hand to dangle an exercise band, a towel, or a similar object over your shoulder. Grasp the band with your left / right hand so you are holding on to both ends. Gently pull up on the band until you feel a stretch in the front of your left / right shoulder. The movement of your arm toward the center of your body is called internal rotation. Avoid shrugging your shoulder while you raise your arm. Keep your shoulder blade tucked down toward the middle of your back. Hold for __________ seconds. Release the stretch by letting go of the band and lowering your hands. Repeat __________ times. Complete this exercise __________ times a day. Strengthening exercises External rotation  Sit in a stable chair without armrests. Secure an exercise band to a stable object at elbow height on your left / right side. Place a soft object, such as a folded towel or a small pillow, between your left / right upper arm and your body to move your elbow about 4 inches (10 cm) away from your side. Hold the end of the exercise band so it is tight and there is no slack. Keeping your elbow pressed  against the soft object, slowly move your forearm out, away from your abdomen (external rotation). Keep your body steady so only your forearm moves. Hold for __________ seconds. Slowly return to the starting position. Repeat __________ times. Complete this exercise __________ times a day. Shoulder abduction  Sit in a stable chair without armrests, or stand up. Hold a __________ weight in your left / right hand, or hold an exercise band with both hands. Start with your arms straight down and your left / right palm facing in, toward your body. Slowly lift your left / right hand out to your side (abduction). Do not lift your hand above shoulder height unless your health care provider tells you that this is safe. Keep your arms straight. Avoid shrugging your shoulder while you do this movement. Keep your shoulder blade tucked down toward the middle of your back. Hold for __________ seconds. Slowly lower your arm, and return to the starting position. Repeat __________ times. Complete this exercise __________ times a day. Shoulder extension Sit in a stable chair without armrests, or stand up. Secure an exercise band   to a stable object in front of you so it is at shoulder height. Hold one end of the exercise band in each hand. Your palms should face each other. Straighten your elbows and lift your hands up to shoulder height. Step back, away from the secured end of the exercise band, until the band is tight and there is no slack. Squeeze your shoulder blades together as you pull your hands down to the sides of your thighs (extension). Stop when your hands are straight down by your sides. Do not let your hands go behind your body. Hold for __________ seconds. Slowly return to the starting position. Repeat __________ times. Complete this exercise __________ times a day. Shoulder row Sit in a stable chair without armrests, or stand up. Secure an exercise band to a stable object in front of you so it  is at waist height. Hold one end of the exercise band in each hand. Position your palms so that your thumbs are facing the ceiling (neutral position). Bend each of your elbows to a 90-degree angle (right angle) and keep your upper arms at your sides. Step back until the band is tight and there is no slack. Slowly pull your elbows back behind you. Hold for __________ seconds. Slowly return to the starting position. Repeat __________ times. Complete this exercise __________ times a day. Shoulder press-ups  Sit in a stable chair that has armrests. Sit upright, with your feet flat on the floor. Put your hands on the armrests so your elbows are bent and your fingers are pointing forward. Your hands should be about even with the sides of your body. Push down on the armrests and use your arms to lift yourself off the chair. Straighten your elbows and lift yourself up as much as you comfortably can. Move your shoulder blades down, and avoid letting your shoulders move up toward your ears. Keep your feet on the ground. As you get stronger, your feet should support less of your body weight as you lift yourself up. Hold for __________ seconds. Slowly lower yourself back into the chair. Repeat __________ times. Complete this exercise __________ times a day. Wall push-ups  Stand so you are facing a stable wall. Your feet should be about one arm-length away from the wall. Lean forward and place your palms on the wall at shoulder height. Keep your feet flat on the floor as you bend your elbows and lean forward toward the wall. Hold for __________ seconds. Straighten your elbows to push yourself back to the starting position. Repeat __________ times. Complete this exercise __________ times a day. This information is not intended to replace advice given to you by your health care provider. Make sure you discuss any questions you have with your healthcare provider. Document Revised: 01/16/2019 Document  Reviewed: 10/24/2018 Elsevier Patient Education  2022 [employer].  

## 2021-11-02 ENCOUNTER — Ambulatory Visit (HOSPITAL_COMMUNITY)
Admission: RE | Admit: 2021-11-02 | Discharge: 2021-11-02 | Disposition: A | Discharge status: Home / Self Care | Payer: Medicare Other | Source: Ambulatory Visit | Attending: Rheumatology | Admitting: Rheumatology

## 2021-11-02 ENCOUNTER — Other Ambulatory Visit: Payer: Self-pay

## 2021-11-02 DIAGNOSIS — Z79899 Other long term (current) drug therapy: Secondary | ICD-10-CM | POA: Insufficient documentation

## 2021-11-02 LAB — CBC WITH DIFFERENTIAL/PLATELET
Abs Immature Granulocytes: 0.01 10*3/uL (ref 0.00–0.07)
Basophils Absolute: 0 10*3/uL (ref 0.0–0.1)
Basophils Relative: 1 %
Eosinophils Absolute: 0.1 10*3/uL (ref 0.0–0.5)
Eosinophils Relative: 2 %
HCT: 40.5 % (ref 39.0–52.0)
Hemoglobin: 13.3 g/dL (ref 13.0–17.0)
Immature Granulocytes: 0 %
Lymphocytes Relative: 43 %
Lymphs Abs: 1.9 10*3/uL (ref 0.7–4.0)
MCH: 30.7 pg (ref 26.0–34.0)
MCHC: 32.8 g/dL (ref 30.0–36.0)
MCV: 93.5 fL (ref 80.0–100.0)
Monocytes Absolute: 0.5 10*3/uL (ref 0.1–1.0)
Monocytes Relative: 12 %
Neutro Abs: 1.9 10*3/uL (ref 1.7–7.7)
Neutrophils Relative %: 42 %
Platelets: 246 10*3/uL (ref 150–400)
RBC: 4.33 MIL/uL (ref 4.22–5.81)
RDW: 13.3 % (ref 11.5–15.5)
WBC: 4.4 10*3/uL (ref 4.0–10.5)
nRBC: 0 % (ref 0.0–0.2)

## 2021-11-02 LAB — COMPREHENSIVE METABOLIC PANEL
ALT: 18 U/L (ref 0–44)
AST: 19 U/L (ref 15–41)
Albumin: 3.7 g/dL (ref 3.5–5.0)
Alkaline Phosphatase: 54 U/L (ref 38–126)
Anion gap: 8 (ref 5–15)
BUN: 12 mg/dL (ref 8–23)
CO2: 26 mmol/L (ref 22–32)
Calcium: 9.5 mg/dL (ref 8.9–10.3)
Chloride: 105 mmol/L (ref 98–111)
Creatinine, Ser: 0.97 mg/dL (ref 0.61–1.24)
GFR, Estimated: >60 mL/min (ref >60)
Glucose, Bld: 96 mg/dL (ref 70–99)
Potassium: 4 mmol/L (ref 3.5–5.1)
Sodium: 139 mmol/L (ref 135–145)
Total Bilirubin: 0.6 mg/dL (ref 0.3–1.2)
Total Protein: 7.3 g/dL (ref 6.5–8.1)

## 2021-11-02 MED ORDER — DIPHENHYDRAMINE HCL 25 MG PO CAPS
ORAL_CAPSULE | ORAL | Status: AC
  Filled 2021-11-02: qty 1

## 2021-11-02 MED ORDER — ACETAMINOPHEN 325 MG PO TABS
ORAL_TABLET | ORAL | Status: AC
  Filled 2021-11-02: qty 2

## 2021-11-02 MED ORDER — DIPHENHYDRAMINE HCL 25 MG PO CAPS
25.0000 mg | ORAL_CAPSULE | Freq: Once | ORAL | Status: AC
  Administered 2021-11-02: 25 mg via ORAL

## 2021-11-02 MED ORDER — SODIUM CHLORIDE 0.9 % IV SOLN
750.0000 mg | Freq: Once | INTRAVENOUS | Status: AC
  Administered 2021-11-02: 750 mg via INTRAVENOUS
  Filled 2021-11-02: qty 30

## 2021-11-02 MED ORDER — ACETAMINOPHEN 325 MG PO TABS
650.0000 mg | ORAL_TABLET | Freq: Once | ORAL | Status: AC
  Administered 2021-11-02: 650 mg via ORAL

## 2021-11-02 NOTE — Progress Notes (Signed)
CBC and CMP WNL

## 2021-11-06 ENCOUNTER — Telehealth: Payer: Self-pay | Admitting: Pharmacy Technician

## 2021-11-06 NOTE — Telephone Encounter (Signed)
Per chart patient switched insurance for 2023. Patient now has [place] [employer]vantage plan.  Will call new plan to check benefits for his Orencia Infusions.  J code- [id number]  Dose: 7104m every 6 weeks (appropriate based on last recorded weight of 72.6 kg)  Patient had last infusion on 11/02/21.  Plan?follows Medicare guidelines and?covers 80% of the infusion and no authorization is required?for [id number] Patient will be required to pay the 20% coinsurance per each infusion, which will be applied to [facility] Out of Pocket. Patient has met $0.00 as of $5,650.   Plan has no deductibles.   Phone# [phone]

## 2021-11-13 ENCOUNTER — Other Ambulatory Visit: Payer: Self-pay

## 2021-11-13 ENCOUNTER — Encounter: Payer: Self-pay | Admitting: Adult Health

## 2021-11-13 ENCOUNTER — Ambulatory Visit (INDEPENDENT_AMBULATORY_CARE_PROVIDER_SITE_OTHER): Payer: Medicare Other | Admitting: Adult Health

## 2021-11-13 DIAGNOSIS — F39 Unspecified mood [affective] disorder: Secondary | ICD-10-CM

## 2021-11-13 DIAGNOSIS — G4700 Insomnia, unspecified: Secondary | ICD-10-CM | POA: Diagnosis not present

## 2021-11-13 DIAGNOSIS — F411 Generalized anxiety disorder: Secondary | ICD-10-CM | POA: Diagnosis not present

## 2021-11-13 DIAGNOSIS — F331 Major depressive disorder, recurrent, moderate: Secondary | ICD-10-CM

## 2021-11-13 NOTE — Progress Notes (Signed)
[name] [name] [id number] 1950-10-13 71 y.o.  Subjective:   Patient ID:  [name] [name] is a 72 y.o. (DOB 04/26/1951) male.  Chief Complaint: No chief complaint on file.   HPI ETHON WYMER presents to the office today for follow-up of anxiety, depression, mood disorder, and insomnia.  Describes mood today as "ok". Pleasant. Mood symptoms - denies depression, anxiety, and irritability. Mood is consistent. Stating "I'm doing pretty good". Feels like current medications are working well. Stable interest and motivation. Taking medications as prescribed.  Energy levels stable. Active, has a regular exercise routine - going to the gym every day.  Enjoys some usual interests and activities. Single. Not dating. Lives alone. Spending time with dog "Minnie". Talking with children. Appetite adequate. Weight stable 160 pounds Sleeps well most nights. Averages 5 to 7 hours  Focus and concentration stable. Completing tasks. Managing aspects of household. Retired. Denies SI or HI.  Denies AH or VH.   Flowsheet Row INFUSION 120 from 08/10/2021 in [facility] 120 from 06/29/2021 in [place] 120 from 05/18/2021 in [facility] No Risk No Risk No Risk        Review of Systems:  Review of Systems  Musculoskeletal:  Negative for gait problem.  Neurological:  Negative for tremors.  Psychiatric/Behavioral:         Please refer to HPI   Medications: I have reviewed the patient's current medications.  Current Outpatient Medications  Medication Sig Dispense Refill   Abatacept (ORENCIA IV) Inject into the vein every 6 (six) weeks.     acetaminophen (TYLENOL) 325 MG tablet Take 650 mg by mouth every 6 (six) hours as needed.     albuterol (PROAIR HFA) 108 (90 Base) MCG/ACT inhaler INHALE 1-2 PUFFS EVERY 6 HOURS AS NEEDED FOR WHEEZING OR SHORTNESS OF BREATH 8.5  g 12   ARIPiprazole (ABILIFY) 5 MG tablet Take one and one half tablets daily. (Patient taking differently: Take one tablets daily.) 135 tablet 3   fluticasone (FLONASE) 50 MCG/ACT nasal spray Place 1 spray into both nostrils daily. 48 g 4   Fluticasone-Umeclidin-Vilant (TRELEGY ELLIPTA) 100-62.5-25 MCG/INH AEPB INHALE ONE PUFF BY MOUTH DAILY 60 each 12   guaiFENesin (MUCUS RELIEF ADULT PO) Take by mouth daily.     loratadine (CLARITIN) 10 MG tablet Take 10 mg by mouth daily.     losartan-hydrochlorothiazide (HYZAAR) 100-25 MG tablet Take 1 tablet by mouth daily.      montelukast (SINGULAIR) 10 MG tablet TAKE ONE TABLET BY MOUTH DAILY 90 tablet 2   nystatin (MYCOSTATIN) 100000 UNIT/ML suspension Take 5 mLs by mouth daily. prn     Omeprazole (PRILOSEC PO) Take by mouth 2 (two) times daily.      Probiotic Product (PROBIOTIC PO) Take 1 tablet by mouth daily.      propranolol (INDERAL) 20 MG tablet Take 1 tablet (20 mg total) by mouth daily. 90 tablet 3   No current facility-administered medications for this visit.    Medication Side Effects: None  Allergies:  Allergies  Allergen Reactions   Simponi [Golimumab]     Repeated infections   Aminophylline Other (See Comments)    Other reaction(s): Unknown   Aspirin     REACTION: throat swelling   Other Other (See Comments)    REACTION: oral sores Other reaction(s): rash   Theophyllines     Past Medical History:  Diagnosis Date  Allergic-infective asthma 12/24/2007   FENO- 08/07/16- 25 Office spirometry 08/07/16- WNL, FEV1 3.59/ 110%, R 0.81    Asthmatic bronchitis with acute exacerbation 10/15/2016   Coarse tremors 12/29/2019   Upper extremities, bilateral   Diplopia 12/29/2019   Generalized anxiety disorder    GERD (gastroesophageal reflux disease) 12/24/2007   Major depressive disorder in remission 07/15/2017   Mild neurocognitive disorder 09/14/2020   Polyp of nasal cavity 12/24/2007   Regular astigmatism of both eyes  12/29/2019   Rheumatoid arthritis of multiple sites without rheumatoid factor 09/18/2010   Seasonal and perennial allergic rhinitis 12/24/2007   Sinusitis, chronic 12/24/2007   Recurrent acute maxillary and frontal sinusitis     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   Please see review of systems for further details on the patient's review from today.   Objective:   Physical Exam:  There were no vitals taken for this visit.  Physical Exam Constitutional:      General: He is not in acute distress. Musculoskeletal:        General: No deformity.  Neurological:     Mental Status: He is alert and oriented to person, place, and time.     Coordination: Coordination normal.  Psychiatric:        Attention and Perception: Attention and perception normal. He does not perceive auditory or visual hallucinations.        Mood and Affect: Mood normal. Mood is not anxious or depressed. Affect is not labile, blunt, angry or inappropriate.        Speech: Speech normal.        Behavior: Behavior normal.        Thought Content: Thought content normal. Thought content is not paranoid or delusional. Thought content does not include homicidal or suicidal ideation. Thought content does not include homicidal or suicidal plan.        Cognition and Memory: Cognition and memory normal.        Judgment: Judgment normal.     Comments: Insight intact    Lab Review:     Component Value Date/Time   NA 139 11/02/2021 0954   K 4.0 11/02/2021 0954   CL 105 11/02/2021 0954   CO2 26 11/02/2021 0954   GLUCOSE 96 11/02/2021 0954   BUN 12 11/02/2021 0954   CREATININE 0.97 11/02/2021 0954   CREATININE 1.10 12/07/2019 0859   CALCIUM 9.5 11/02/2021 0954   PROT 7.3 11/02/2021 0954   ALBUMIN 3.7 11/02/2021 0954   AST 19 11/02/2021 0954   ALT 18 11/02/2021 0954   ALKPHOS 54  11/02/2021 0954   BILITOT 0.6 11/02/2021 0954   GFRNONAA >60 11/02/2021 0954   GFRNONAA 69 12/07/2019 0859   GFRAA >60 05/23/2020 0908   GFRAA 80 12/07/2019 0859       Component Value Date/Time   WBC 4.4 11/02/2021 0954   RBC 4.33 11/02/2021 0954   HGB 13.3 11/02/2021 0954   HCT 40.5 11/02/2021 0954   PLT 246 11/02/2021 0954   MCV 93.5 11/02/2021 0954   MCH 30.7 11/02/2021 0954   MCHC 32.8 11/02/2021 0954   RDW 13.3 11/02/2021 0954   LYMPHSABS 1.9 11/02/2021 0954   MONOABS 0.5 11/02/2021 0954   EOSABS 0.1 11/02/2021 0954   BASOSABS 0.0 11/02/2021 0954    Lithium Lvl  Date Value Ref Range Status  05/23/2017 0.7 0.6 - 1.2 mmol/L Final  Comment:    ** Please note change in unit of measure and reference range(s). **     No results found for: PHENYTOIN, PHENOBARB, VALPROATE, CBMZ   .res Assessment: Plan:    Plan:  Propranolol 20mg  daily for tremors  Abilify 5mg  tablet daily   RTC 6 months  Patient advised to contact office with any questions, adverse effects, or acute worsening in signs and symptoms.  Discussed potential benefits, risk, and side effects of benzodiazepines to include potential risk of tolerance and dependence, as well as possible drowsiness. Advised patient not to drive if experiencing drowsiness and to take lowest possible effective dose to minimize risk of dependence and tolerance.  Discussed potential metabolic side effects associated with atypical antipsychotics, as well as potential risk for movement side effects. Advised pt to contact office if movement side effects occur.   Diagnoses and all orders for this visit:  Generalized anxiety disorder  Major depressive disorder, recurrent episode, moderate (HCC)  Insomnia, unspecified type  Episodic mood disorder ([place])     Please see After Visit Summary for patient specific instructions.  Future Appointments  Date Time Provider [place]  12/11/2021  8:30 AM Hazle Coca, PhD  [facility] None  12/11/2021  9:30 AM [facility] None  12/14/2021 10:00 AM [facility] None  12/18/2021 10:00 AM Hazle Coca, PhD [facility] None  04/02/2022  9:30 AM Bo Merino, MD [facility] None  05/04/2022 11:30 AM Baird Lyons D, MD [facility] None  09/07/2022  9:00 AM Rondel Jumbo, PA-C [facility] None    No orders of the defined types were placed in this encounter.   -------------------------------

## 2021-11-21 ENCOUNTER — Telehealth: Payer: Self-pay | Admitting: Rheumatology

## 2021-11-21 NOTE — Telephone Encounter (Signed)
Patient left a voicemail stating he received a notice of denial of payment for his Orencia infusion. Patient requests a call back from someone to to direct him on where to go from here. Ph [phone]

## 2021-11-21 NOTE — Telephone Encounter (Signed)
Spoke with patient regarding bill. He states he received a letter from [facility] stating claim was rejecting. Patient states that the date on the bill is from 11/02/21 infusion. He states he called [facility] billing yesterday and was told it was too early to tell cost.  I advised of the same and that medical billing can take 2 months to fully and accurately process. He verbalized understanding.  He states that he has faxed the letter to our clinic to see if we can glean anything from it.  [name] [name], PharmD, MPH, BCPS Clinical Pharmacist (Rheumatology and Pulmonology)

## 2021-12-04 ENCOUNTER — Other Ambulatory Visit: Payer: Self-pay | Admitting: Pharmacist

## 2021-12-04 DIAGNOSIS — M0609 Rheumatoid arthritis without rheumatoid factor, multiple sites: Secondary | ICD-10-CM

## 2021-12-04 DIAGNOSIS — Z79899 Other long term (current) drug therapy: Secondary | ICD-10-CM

## 2021-12-04 NOTE — Progress Notes (Signed)
Next infusion scheduled for Orencia IV on 12/14/21 and due for updated orders. Diagnosis: RA   Dose: 750mg  every 6 weeks (appropriate based on last recorded weight of 72.kg) Last Clinic Visit: 10/31/21 Next Clinic Visit: 04/02/22  Last infusion: 11/02/21  Labs: CBC and CMP on 11/02/21 TB Gold: negative on 06/29/21   Orders placed for Orencia IV x 2 doses along with premedication of acetaminophen and diphenhydramine to be administered 30 minutes before medication infusion.  Standing CBC with diff/platelet and CMP with GFR orders placed to be drawn every 2 months.  Next TB gold due 06/29/22  Knox Saliva, PharmD, MPH, BCPS Clinical Pharmacist (Rheumatology and Pulmonology)

## 2021-12-11 ENCOUNTER — Other Ambulatory Visit: Payer: Self-pay

## 2021-12-11 ENCOUNTER — Encounter: Payer: Self-pay | Admitting: Psychology

## 2021-12-11 ENCOUNTER — Ambulatory Visit: Payer: Medicare Other | Admitting: Psychology

## 2021-12-11 ENCOUNTER — Ambulatory Visit (INDEPENDENT_AMBULATORY_CARE_PROVIDER_SITE_OTHER): Payer: Medicare Other | Admitting: Psychology

## 2021-12-11 DIAGNOSIS — G3184 Mild cognitive impairment, so stated: Secondary | ICD-10-CM

## 2021-12-11 DIAGNOSIS — F3340 Major depressive disorder, recurrent, in remission, unspecified: Secondary | ICD-10-CM

## 2021-12-11 DIAGNOSIS — R4189 Other symptoms and signs involving cognitive functions and awareness: Secondary | ICD-10-CM

## 2021-12-11 DIAGNOSIS — F411 Generalized anxiety disorder: Secondary | ICD-10-CM | POA: Diagnosis not present

## 2021-12-11 NOTE — Progress Notes (Signed)
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 the above average range based upon a single-word reading test.   Processing speed was below average to average. Basic attention was average. More complex attention (e.g., working memory) was well below average. Executive functioning was variable, ranging from the well below average to average normative ranges. He did perform in the above average range on a task assessing safety and judgment.  Assessed receptive language abilities were average. Likewise, Mr. Kapur did not exhibit any difficulties comprehending task instructions and answered all questions asked of him appropriately. Assessed expressive language (e.g., verbal fluency and confrontation naming) was below average to average.     Assessed visuospatial/visuoconstructional abilities were above average to well above average.    Learning (i.e., encoding) of novel verbal and visual information was variable, ranging from the well below average to average normative ranges. Spontaneous delayed recall (i.e., retrieval) of previously learned information was also variable, ranging from the exceptionally low to average normative ranges. Retention rates were 90% across a story learning task, 0% across a list learning task, and 120% across a shape  learning task. Performance across recognition tasks was below average to above average, suggesting some evidence for information consolidation.   Results of emotional screening instruments suggested that recent symptoms of generalized anxiety were in the mild to moderate range, while symptoms of depression were within the mild range. A screening instrument assessing recent sleep quality suggested the presence of mild sleep dysfunction.  Tables of Scores:   Note: This summary of test scores accompanies the interpretive report and should not be considered in isolation without reference to the appropriate sections in the text. Descriptors are based on appropriate normative data and may be adjusted based on clinical judgment. Terms such as "Within Normal Limits" and "Outside Normal Limits" are used when a more specific description of the test score cannot be determined. Descriptors refer to the current evaluation only.        Percentile - Normative Descriptor > 98 - Exceptionally High 91-97 - Well Above Average 75-90 - Above Average 25-74 - Average 9-24 - Below Average 2-8 - Well Below Average < 2 - Exceptionally Low        Validity:    DESCRIPTOR   December 2021 Current    ACS Word Choice: --- --- --- Within Normal Limits  Dot Counting Test: --- --- --- Within Normal Limits  NAB EVI: --- --- --- Within Normal Limits        Orientation:       Raw Score Raw Score Percentile   NAB Orientation, Form 1 29/29 29/29 --- ---        Cognitive Screening:       Raw Score Raw Score Percentile   SLUMS: 13/30 22/30 --- ---        Intellectual Functioning:       Standard Score Standard Score Percentile   Test of Premorbid Functioning: 108 115 84 Above Average        Memory:      NAB Memory Module, Form 1: Standard Score/ T Score Standard Score/ T Score Percentile   Total Memory  Index 60 77 6 Well Below Average  List Learning        Total Trials 1-3 12/36 (24) 15/36 (33) 5 Well Below Average     List B 4/12 (47) 3/12 (40) 16 Below Average    Short Delay Free Recall 0/12 (19) 5/12 (38) 12 Below Average    Long Delay Free Recall 0/12 (23) 0/12 (21) <1 Exceptionally Low    Retention Percentage 0 (19) 0 (15) <1 Exceptionally Low    Recognition Discriminability 1 (26) 3 (37) 9 Below Average  Shape Learning        Total Trials 1-3 15/27 (47) 13/27 (42) 21 Below Average    Delayed Recall 5/9 (45) 6/9 (51) 54 Average    Retention Percentage 83 (45) 120 (57) 75 Above Average    Recognition Discriminability 6 (46) 8 (57) 75 Above Average  Story Learning        Immediate Recall 20/80 (34) 57/80 (45) 31 Average    Delayed Recall 0/40 (22) 28/40 (41) 18 Below Average    Retention Percentage 0 (13) 90 (51) 54 Average  Daily Living Memory        Immediate Recall 22/51 (19) 37/51 (39) 14 Below Average    Delayed Recall 6/17 (19) 10/17 (34) 5 Well Below Average    Retention Percentage 67 (34) 71 (39) 14 Below Average    Recognition Hits 9/10 (52) 9/10 (52) 58 Average        Attention/Executive Function:      Trail Making Test (TMT): Raw Score (T Score) Raw Score (T Score) Percentile     Part A 49 secs.,  0 errors (37) 42 secs.,  0 errors (41) 18 Below Average    Part B Discontinued 112 secs.,  0 errors (39) 14 Below Average         Symbol Digit Modalities Test (SDMT): Raw Score (Z-Score) Raw Score (Z-Score) Percentile     Oral 31 (-1.61) 36 (-1.24) 11 Below Average        NAB Attention Module, Form 1: T Score T Score Percentile     Digits Forward 53 53 62 Average    Digits Backwards 25 32 4 Well Below Average         Scaled Score Scaled Score Percentile   WAIS-IV Similarities: 8 9 37 Average        D-KEFS Color-Word Interference Test: Raw Score (Scaled Score) Raw Score (Scaled Score) Percentile     Color Naming 41 secs. (6) 40 secs. (7) 16 Below Average    Word Reading 24 secs. (10) 25 secs. (10) 50 Average    Inhibition 135 secs. (1) 95 secs. (5) 5 Well Below Average       Total Errors 4 errors (8) 5 errors (6) 9 Below Average    Inhibition/Switching 124 secs. (3) 114 secs. (5) 5 Well Below Average      Total Errors 7 errors (6) 4 errors (9) 37 Average        NAB Executive Functions Module, Form 1: T Score T Score Percentile     Judgment 35 57 75 Above Average        Language:      Verbal Fluency Test: Raw Score (T Score) Raw Score (T Score) Percentile     Phonemic Fluency (FAS) 28 (37) 37 (44) 27 Average    Animal Fluency 11 (29) 16 (41) 18 Below Average         NAB Language Module, Form 1: T Score  T Score Percentile     Auditory Comprehension 55 55 69 Average    Naming 29/31 (43) 30/31 (54) 66 Average        Visuospatial/Visuoconstruction:       Raw Score Raw Score Percentile   Clock Drawing: 9/10 10/10 --- Within Normal Limits        NAB Spatial Module, Form 1: T Score T Score Percentile     Figure Drawing Copy 46 64 92 Well Above Average         Scaled Score Scaled Score Percentile   WAIS-IV Block Design: 6 13 84 Above Average        Mood and Personality:       Raw Score Raw Score Percentile   Geriatric Depression Scale: 9 14 --- Mild  Geriatric Anxiety Scale: 24 21 --- Mild    Somatic 8 8 --- Mild    Cognitive 7 8 --- Moderate    Affective 9 5 --- Mild        Additional Questionnaires:       Raw Score Raw Score Percentile   PROMIS Sleep Disturbance Questionnaire: 15 25 --- Mild   Informed Consent and Coding/Compliance:   The current evaluation represents a clinical evaluation for the purposes previously outlined by the referral source and is in no way reflective of a forensic evaluation.   Mr. John was provided with a verbal description of the nature and purpose of the present neuropsychological evaluation. Also reviewed were the foreseeable risks and/or discomforts and benefits of the procedure, limits of confidentiality, and mandatory reporting requirements of this provider. The patient was given the opportunity to ask questions  and receive answers about the evaluation. Oral consent to participate was provided by the patient.   This evaluation was conducted by Christia Reading, Ph.D., ABPP-CN, board certified clinical neuropsychologist. Mr. Fehringer completed a clinical interview with Dr. Melvyn Novas, billed as one unit [phone], and 135 minutes of cognitive testing and scoring, billed as one unit [phone] and four additional units 96139. Psychometrist Milana Kidney, B.S., assisted Dr. Melvyn Novas with test administration and scoring procedures. As a separate and discrete service, Dr. Melvyn Novas spent a total of 160 minutes in interpretation and report writing billed as one unit [phone] and two units 96133.

## 2021-12-11 NOTE — Progress Notes (Signed)
? ?  Psychometrician Note ?  ?Cognitive testing was administered to [name] [name] by Milana Kidney, B.S. (psychometrist) under the supervision of Dr. Christia Reading, Ph.D., licensed psychologist on 12/11/21. [name] [name] did not appear overtly distressed by the testing session per behavioral observation or responses across self-report questionnaires. Rest breaks were offered.  ?  ?The battery of tests administered was selected by Dr. Christia Reading, Ph.D. with consideration to [name] [name]'s current level of functioning, the nature of his symptoms, emotional and behavioral responses during interview, level of literacy, observed level of motivation/effort, and the nature of the referral question. This battery was communicated to the psychometrist. Communication between Dr. Christia Reading, Ph.D. and the psychometrist was ongoing throughout the evaluation and Dr. Christia Reading, Ph.D. was immediately accessible at all times. Dr. Christia Reading, Ph.D. provided supervision to the psychometrist on the date of this service to the extent necessary to assure the quality of all services provided.  ?  ?[name] [name] will return within approximately 1-2 weeks for an interactive feedback session with Dr. Melvyn Novas at which time his test performances, clinical impressions, and treatment recommendations will be reviewed in detail. [name] [name] understands he can contact our office should he require our assistance before this time. ? ?A total of 135 minutes of billable time were spent face-to-face with [name] [name] by the psychometrist. This includes both test administration and scoring time. Billing for these services is reflected in the clinical report generated by Dr. Christia Reading, Ph.D. ? ?This note reflects time spent with the psychometrician and does not include test scores or any clinical interpretations made by Dr. Melvyn Novas. The full report will follow in a separate note.  ?

## 2021-12-13 ENCOUNTER — Telehealth: Payer: Self-pay | Admitting: Pharmacist

## 2021-12-13 NOTE — Telephone Encounter (Signed)
Patient called stating he has new insurance through [place] and [facility] infusions require prior auth. I advised him to cancel his infusion that is tomorrow since we may not have authorization in place by then. ? ?Phone: [phone] ?[facility] - [id number], [id number] ? ?Dx: Rheumatoid arthritis (M06.09) ? ?PPO enhance policy. Active as of 10/08/21. Plan does not have deductible. There is $5650 in and out of network out of pocket max. As of 12/13/21, patient has $777.47. There is 20% coinsurance for in-network provider.  is in-network. ? ?J0129 - prior authorization is not required. ? ?I called patient back - he states he received a letter last month and I advised that [employer] does not require [place] for his infusion ? ?I advised patient to call infusion center back to see if she can get him scheduled for infusion otmorrow as before. ? ?Knox Saliva, PharmD, MPH, BCPS ?Clinical Pharmacist (Rheumatology and Pulmonology) ? ?

## 2021-12-14 ENCOUNTER — Ambulatory Visit (HOSPITAL_COMMUNITY)
Admission: RE | Admit: 2021-12-14 | Discharge: 2021-12-14 | Disposition: A | Discharge status: Home / Self Care | Payer: Medicare Other | Source: Ambulatory Visit | Attending: Rheumatology | Admitting: Rheumatology

## 2021-12-14 ENCOUNTER — Telehealth: Payer: Self-pay | Admitting: Pharmacist

## 2021-12-14 ENCOUNTER — Other Ambulatory Visit: Payer: Self-pay

## 2021-12-14 ENCOUNTER — Other Ambulatory Visit: Payer: Self-pay | Admitting: Pharmacist

## 2021-12-14 ENCOUNTER — Inpatient Hospital Stay (HOSPITAL_COMMUNITY): Admission: RE | Admit: 2021-12-14 | Payer: PRIVATE HEALTH INSURANCE | Source: Ambulatory Visit

## 2021-12-14 DIAGNOSIS — M0609 Rheumatoid arthritis without rheumatoid factor, multiple sites: Secondary | ICD-10-CM

## 2021-12-14 DIAGNOSIS — Z79899 Other long term (current) drug therapy: Secondary | ICD-10-CM

## 2021-12-14 LAB — CBC WITH DIFFERENTIAL/PLATELET
Abs Immature Granulocytes: 0.01 10*3/uL (ref 0.00–0.07)
Basophils Absolute: 0 10*3/uL (ref 0.0–0.1)
Basophils Relative: 1 %
Eosinophils Absolute: 0.1 10*3/uL (ref 0.0–0.5)
Eosinophils Relative: 2 %
HCT: 44.9 % (ref 39.0–52.0)
Hemoglobin: 14.9 g/dL (ref 13.0–17.0)
Immature Granulocytes: 0 %
Lymphocytes Relative: 39 %
Lymphs Abs: 2.1 10*3/uL (ref 0.7–4.0)
MCH: 31.1 pg (ref 26.0–34.0)
MCHC: 33.2 g/dL (ref 30.0–36.0)
MCV: 93.7 fL (ref 80.0–100.0)
Monocytes Absolute: 0.6 10*3/uL (ref 0.1–1.0)
Monocytes Relative: 11 %
Neutro Abs: 2.7 10*3/uL (ref 1.7–7.7)
Neutrophils Relative %: 47 %
Platelets: 303 10*3/uL (ref 150–400)
RBC: 4.79 MIL/uL (ref 4.22–5.81)
RDW: 13 % (ref 11.5–15.5)
WBC: 5.5 10*3/uL (ref 4.0–10.5)
nRBC: 0 % (ref 0.0–0.2)

## 2021-12-14 LAB — COMPREHENSIVE METABOLIC PANEL
ALT: 15 U/L (ref 0–44)
AST: 21 U/L (ref 15–41)
Albumin: 4.2 g/dL (ref 3.5–5.0)
Alkaline Phosphatase: 59 U/L (ref 38–126)
Anion gap: 10 (ref 5–15)
BUN: 16 mg/dL (ref 8–23)
CO2: 27 mmol/L (ref 22–32)
Calcium: 9.9 mg/dL (ref 8.9–10.3)
Chloride: 103 mmol/L (ref 98–111)
Creatinine, Ser: 1.24 mg/dL (ref 0.61–1.24)
GFR, Estimated: >60 mL/min (ref >60)
Glucose, Bld: 108 mg/dL — ABNORMAL HIGH (ref 70–99)
Potassium: 3.8 mmol/L (ref 3.5–5.1)
Sodium: 140 mmol/L (ref 135–145)
Total Bilirubin: 0.8 mg/dL (ref 0.3–1.2)
Total Protein: 7.9 g/dL (ref 6.5–8.1)

## 2021-12-14 MED ORDER — ACETAMINOPHEN 325 MG PO TABS: 650.0000 mg | ORAL_TABLET | ORAL | Status: DC

## 2021-12-14 MED ORDER — SODIUM CHLORIDE 0.9 % IV SOLN
750.0000 mg | INTRAVENOUS | Status: DC
  Administered 2021-12-14: 10:00:00 750 mg via INTRAVENOUS
  Filled 2021-12-14: qty 30

## 2021-12-14 MED ORDER — DIPHENHYDRAMINE HCL 25 MG PO CAPS: 25.0000 mg | ORAL_CAPSULE | ORAL | Status: DC

## 2021-12-14 NOTE — Progress Notes (Signed)
CBC WNL

## 2021-12-14 NOTE — Telephone Encounter (Signed)
Received message from [facility]: Pt is here for [place] today. We are good on orders. Labs being drawn today. He has asked [place] to schedule him every 8 weeks because he cannot afford every 6 weeks. Wanted to let you know in case you needed to adjust his dose for future visits. Thanks ? ?Knox Saliva, PharmD, MPH, BCPS ?Clinical Pharmacist (Rheumatology and Pulmonology) ?

## 2021-12-14 NOTE — Progress Notes (Signed)
Next infusion scheduled for [facility] on 02/08/22 and due for updated orders. ?Diagnosis: RA ? ?Dose: '750mg'$  every 8 weeks (patient would like to move back to every 8 weeks interval due to infusion cost) ? ?Last Clinic Visit: 10/31/21 ?Next Clinic Visit: 04/02/22 ? ?Last infusion: 12/14/21 ? ?Labs: CBC and CMP On 12/14/21 - wnl ?TB Gold: negative on 06/29/21  ? ?Orders placed for [facility] IV x 2 doses along with premedication of acetaminophen and diphenhydramine to be administered 30 minutes before medication infusion. ? ?Standing CBC with diff/platelet and CMP with GFR orders placed to be drawn every 2 months.  Next TB gold due 06/29/22 ? ?Knox Saliva, PharmD, MPH, BCPS ?Clinical Pharmacist (Rheumatology and Pulmonology) ?

## 2021-12-14 NOTE — Progress Notes (Signed)
Glucose is 108.  Rest of CMP WNL

## 2021-12-18 ENCOUNTER — Ambulatory Visit (INDEPENDENT_AMBULATORY_CARE_PROVIDER_SITE_OTHER): Payer: Medicare Other | Admitting: Psychology

## 2021-12-18 ENCOUNTER — Other Ambulatory Visit: Payer: Self-pay

## 2021-12-18 DIAGNOSIS — G3184 Mild cognitive impairment, so stated: Secondary | ICD-10-CM | POA: Diagnosis not present

## 2021-12-18 NOTE — Progress Notes (Signed)
? ?  Neuropsychology Feedback Session ?[facility] ?[place] Department of Neurology ? ?Reason for Referral:  ? ?[name] [name] is a 71 y.o. right-handed Caucasian male referred by Alonza Bogus, D.O., to characterize his current cognitive functioning and assist with diagnostic clarity and treatment planning in the context of a prior diagnosis of a mild neurocognitive disorder with significant verbal memory impairment, upper extremity tremors, and concerns for ongoing cognitive decline.  ? ?Feedback:  ? ?[name] [name] completed a comprehensive neuropsychological evaluation on 12/11/2021. Please refer to that encounter for the full report and recommendations. Briefly, results suggested ongoing weakness/variability across verbal memory, as well as deficits across complex attention (i.e., working memory). Performance variability was further exhibited across executive functioning. Relative to his previous evaluation in December 2021, no domains exhibited measurable decline. Notable improvements were seen across several isolated tasks, including a task assessing safety and judgment, a motorized block manipulation task assessing visuospatial abilities, a motorized sequencing task assessing cognitive flexibility, and his ability to learn and later recall story-based information. More mild improvements were seen across semantic fluency and his ability to learn and remember aspects of daily living (e.g., medication instructions). All remaining cognitive domains exhibited stability. This includes notable impairment and amnestic performances across a list learning verbal memory task, as well as impairment in complex attention. While concerns surrounding a very early presentation of Alzheimer's disease were previously expressed, these results certainly lessen these concerns. Improvement seen in learning and retaining story-based information and semantic fluency, as well as stable performances across confrontation  naming, is certainly not consistent with the typical progression of this illness. Despite this positive news, I unfortunately do not have an answer regarding the cause for ongoing impairment surrounding certain aspects of verbal memory and complex attention at the present time. ? ?[name] [name] was unaccompanied during the current feedback session. Content of the current session focused on the results of his neuropsychological evaluation. [name] [name] was given the opportunity to ask questions and his questions were answered. He was encouraged to reach out should additional questions arise. A copy of his report was provided at the conclusion of the visit.  ? ?  ? ?18 minutes were spent preparing for, conducting, and documenting the current feedback session with [name] [name], billed as one unit [phone].  ?

## 2022-01-25 ENCOUNTER — Ambulatory Visit (HOSPITAL_COMMUNITY): Payer: Medicare Other

## 2022-02-08 ENCOUNTER — Ambulatory Visit (HOSPITAL_COMMUNITY)
Admission: RE | Admit: 2022-02-08 | Discharge: 2022-02-08 | Disposition: A | Discharge status: Home / Self Care | Payer: Medicare Other | Source: Ambulatory Visit | Attending: Rheumatology | Admitting: Rheumatology

## 2022-02-08 DIAGNOSIS — M0609 Rheumatoid arthritis without rheumatoid factor, multiple sites: Secondary | ICD-10-CM | POA: Diagnosis not present

## 2022-02-08 DIAGNOSIS — Z79899 Other long term (current) drug therapy: Secondary | ICD-10-CM | POA: Insufficient documentation

## 2022-02-08 LAB — COMPREHENSIVE METABOLIC PANEL
ALT: 18 U/L (ref 0–44)
AST: 19 U/L (ref 15–41)
Albumin: 3.9 g/dL (ref 3.5–5.0)
Alkaline Phosphatase: 57 U/L (ref 38–126)
Anion gap: 5 (ref 5–15)
BUN: 12 mg/dL (ref 8–23)
CO2: 28 mmol/L (ref 22–32)
Calcium: 9.3 mg/dL (ref 8.9–10.3)
Chloride: 107 mmol/L (ref 98–111)
Creatinine, Ser: 1.2 mg/dL (ref 0.61–1.24)
GFR, Estimated: >60 mL/min (ref >60)
Glucose, Bld: 78 mg/dL (ref 70–99)
Potassium: 3.8 mmol/L (ref 3.5–5.1)
Sodium: 140 mmol/L (ref 135–145)
Total Bilirubin: 0.7 mg/dL (ref 0.3–1.2)
Total Protein: 7.1 g/dL (ref 6.5–8.1)

## 2022-02-08 LAB — CBC WITH DIFFERENTIAL/PLATELET
Abs Immature Granulocytes: 0.01 10*3/uL (ref 0.00–0.07)
Basophils Absolute: 0 10*3/uL (ref 0.0–0.1)
Basophils Relative: 1 %
Eosinophils Absolute: 0.1 10*3/uL (ref 0.0–0.5)
Eosinophils Relative: 1 %
HCT: 40.6 % (ref 39.0–52.0)
Hemoglobin: 13.3 g/dL (ref 13.0–17.0)
Immature Granulocytes: 0 %
Lymphocytes Relative: 45 %
Lymphs Abs: 2.2 10*3/uL (ref 0.7–4.0)
MCH: 30.6 pg (ref 26.0–34.0)
MCHC: 32.8 g/dL (ref 30.0–36.0)
MCV: 93.3 fL (ref 80.0–100.0)
Monocytes Absolute: 0.7 10*3/uL (ref 0.1–1.0)
Monocytes Relative: 14 %
Neutro Abs: 2 10*3/uL (ref 1.7–7.7)
Neutrophils Relative %: 39 %
Platelets: 278 10*3/uL (ref 150–400)
RBC: 4.35 MIL/uL (ref 4.22–5.81)
RDW: 12.7 % (ref 11.5–15.5)
WBC: 5 10*3/uL (ref 4.0–10.5)
nRBC: 0 % (ref 0.0–0.2)

## 2022-02-08 MED ORDER — SODIUM CHLORIDE 0.9 % IV SOLN
750.0000 mg | INTRAVENOUS | Status: DC
  Administered 2022-02-08: 750 mg via INTRAVENOUS
  Filled 2022-02-08: qty 30

## 2022-02-08 MED ORDER — DIPHENHYDRAMINE HCL 25 MG PO CAPS: 25.0000 mg | ORAL_CAPSULE | ORAL | Status: DC

## 2022-02-08 MED ORDER — ACETAMINOPHEN 325 MG PO TABS: 650.0000 mg | ORAL_TABLET | ORAL | Status: DC

## 2022-02-08 NOTE — Progress Notes (Signed)
CBC and CMP WNL

## 2022-02-21 DIAGNOSIS — K219 Gastro-esophageal reflux disease without esophagitis: Secondary | ICD-10-CM | POA: Diagnosis not present

## 2022-02-21 DIAGNOSIS — F331 Major depressive disorder, recurrent, moderate: Secondary | ICD-10-CM | POA: Diagnosis not present

## 2022-02-21 DIAGNOSIS — I10 Essential (primary) hypertension: Secondary | ICD-10-CM | POA: Diagnosis not present

## 2022-02-21 DIAGNOSIS — J4520 Mild intermittent asthma, uncomplicated: Secondary | ICD-10-CM | POA: Diagnosis not present

## 2022-03-23 NOTE — Progress Notes (Unsigned)
Office Visit Note  Patient: [name] [name]             Date of Birth: 1950/10/19           MRN: [id number]             PCP: Wenda Low, MD Referring: Wenda Low, MD Visit Date: 04/02/2022 Occupation: '@GUAROCC'$ @  Subjective:  No chief complaint on file.   History of Present Illness: [name] [name] is a 71 y.o. male ***   Activities of Daily Living:  Patient reports morning stiffness for *** {minute/hour:19697}.   Patient {ACTIONS;DENIES/REPORTS:21021675::"Denies"} nocturnal pain.  Difficulty dressing/grooming: {ACTIONS;DENIES/REPORTS:21021675::"Denies"} Difficulty climbing stairs: {ACTIONS;DENIES/REPORTS:21021675::"Denies"} Difficulty getting out of chair: {ACTIONS;DENIES/REPORTS:21021675::"Denies"} Difficulty using hands for taps, buttons, cutlery, and/or writing: {ACTIONS;DENIES/REPORTS:21021675::"Denies"}  No Rheumatology ROS completed.   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) 09/18/2010   Polyp of nasal cavity 12/24/2007   Sinusitis, chronic 12/24/2007   Seasonal and perennial allergic rhinitis 12/24/2007   Allergic-infective asthma 12/24/2007   GERD (gastroesophageal reflux disease) 12/24/2007    Past Medical History:  Diagnosis Date   Allergic-infective asthma  12/24/2007   FENO- 08/07/16- 25 Office spirometry 08/07/16- WNL, FEV1 3.59/ 110%, R 0.81    Asthmatic bronchitis, mild intermittent, uncomplicated 50/53/9767   PFT 11/01/16- WNL-FVC 5.30/123%, FEV1 4.0/124%, ratio 0.75, FEF 25-75% 3.49/135%, TLC 131%, DLCO 99%  Last Assessment & Plan:  Doing well with Trelegy. Suggested protective mask if unavoidable dust.   Bipolar 1 disorder    Coarse tremors 12/29/2019   Upper extremities, bilateral   Diplopia 12/29/2019   Disorder of anterior pituitary 10/31/2021   Esotropia, intermittent 12/29/2019   Generalized anxiety disorder    GERD (gastroesophageal reflux disease) 12/24/2007   Herpes zoster 08/01/2013   October, 2014; right lateral chest   History of COVID-19 10/05/2021   History of IBS 05/29/2018   Major depressive disorder in remission 07/15/2017   Mild neurocognitive disorder 09/14/2020   Polyp of nasal cavity 12/24/2007   Regular astigmatism of both eyes 12/29/2019   Rheumatoid arthritis of multiple sites without rheumatoid factor 09/18/2010   Seasonal and perennial allergic rhinitis 12/24/2007   Sinusitis, chronic 12/24/2007   Recurrent acute maxillary and frontal sinusitis     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    Family History  Problem Relation Age of Onset   Multiple sclerosis Father    Breast cancer Mother    Breast cancer Sister    Alcoholism Child        now sober   Past Surgical History:  Procedure Laterality Date   CARPAL TUNNEL RELEASE Left    extensive sinus surgery with ablation of the frontal sinuses     HERNIA REPAIR     MOUTH SURGERY  11/2019   nasal polypectomies     TOOTH EXTRACTION     Social History   Social  History Narrative   Right handed    Lives alone    Immunization History  Administered Date(s) Administered   Influenza Split 07/04/2009, 08/02/2011, 07/08/2012, 09/07/2012, 07/06/2013, 07/14/2015, 07/08/2016, 07/04/2020   Influenza, High Dose Seasonal PF 06/19/2017,  06/13/2018, 06/18/2019, 08/24/2019   Influenza,inj,Quad PF,6+ Mos 07/15/2014, 06/19/2017   Influenza-Unspecified 07/25/2016   PFIZER(Purple Top)SARS-COV-2 Vaccination 11/20/2019, 12/15/2019, 06/17/2020   Pneumococcal Conjugate-13 05/26/2018   Pneumococcal Polysaccharide-23 05/27/2006, 07/15/2014   Td 03/06/2007   Tdap 08/23/2012   Zoster Recombinat (Shingrix) 05/23/2018, 08/21/2018   Zoster, Live 04/24/2016, 05/20/2018, 07/30/2018     Objective: Vital Signs: There were no vitals taken for this visit.   Physical Exam   Musculoskeletal Exam: ***  CDAI Exam: CDAI Score: -- Patient Global: --; Provider Global: -- Swollen: --; Tender: -- Joint Exam 04/02/2022   No joint exam has been documented for this visit   There is currently no information documented on the homunculus. Go to the Rheumatology activity and complete the homunculus joint exam.  Investigation: No additional findings.  Imaging: No results found.  Recent Labs: Lab Results  Component Value Date   WBC 5.0 02/08/2022   HGB 13.3 02/08/2022   PLT 278 02/08/2022   NA 140 02/08/2022   K 3.8 02/08/2022   CL 107 02/08/2022   CO2 28 02/08/2022   GLUCOSE 78 02/08/2022   BUN 12 02/08/2022   CREATININE 1.20 02/08/2022   BILITOT 0.7 02/08/2022   ALKPHOS 57 02/08/2022   AST 19 02/08/2022   ALT 18 02/08/2022   PROT 7.1 02/08/2022   ALBUMIN 3.9 02/08/2022   CALCIUM 9.3 02/08/2022   GFRAA >60 05/23/2020   QFTBGOLD NEGATIVE 07/16/2017   QFTBGOLDPLUS Negative 06/29/2021    Speciality Comments: No specialty comments available.  Procedures:  No procedures performed Allergies: Simponi [golimumab], Aminophylline, Aspirin, Other, and Theophyllines   Assessment / Plan:     Visit Diagnoses: Rheumatoid arthritis of multiple sites without rheumatoid factor (HCC)  High risk medication use  Primary osteoarthritis of both hands  Pain in both feet  Trochanteric bursitis, left hip  Coarse tremors  History of  gastroesophageal reflux (GERD)  History of depression  History of asthma  Family history of MS (multiple sclerosis)  Memory loss  History of IBS  Orders: No orders of the defined types were placed in this encounter.  No orders of the defined types were placed in this encounter.   Face-to-face time spent with patient was *** minutes. Greater than 50% of time was spent in counseling and coordination of care.  Follow-Up Instructions: No follow-ups on file.   Ofilia Neas, PA-C  Note - This record has been created using Dragon software.  Chart creation errors have been sought, but may not always  have been located. Such creation errors do not reflect on  the standard of medical care.

## 2022-03-24 ENCOUNTER — Other Ambulatory Visit: Payer: Self-pay | Admitting: Internal Medicine

## 2022-03-24 DIAGNOSIS — J4531 Mild persistent asthma with (acute) exacerbation: Secondary | ICD-10-CM

## 2022-04-02 ENCOUNTER — Encounter: Payer: Self-pay | Admitting: Rheumatology

## 2022-04-02 ENCOUNTER — Ambulatory Visit: Payer: Medicare Other | Admitting: Rheumatology

## 2022-04-02 VITALS — BP 119/77 | HR 57 | Ht 68.0 in | Wt 168.0 lb

## 2022-04-02 DIAGNOSIS — M545 Low back pain, unspecified: Secondary | ICD-10-CM | POA: Diagnosis not present

## 2022-04-02 DIAGNOSIS — G8929 Other chronic pain: Secondary | ICD-10-CM

## 2022-04-02 DIAGNOSIS — Z8709 Personal history of other diseases of the respiratory system: Secondary | ICD-10-CM

## 2022-04-02 DIAGNOSIS — Z79899 Other long term (current) drug therapy: Secondary | ICD-10-CM

## 2022-04-02 DIAGNOSIS — Z8659 Personal history of other mental and behavioral disorders: Secondary | ICD-10-CM

## 2022-04-02 DIAGNOSIS — M19041 Primary osteoarthritis, right hand: Secondary | ICD-10-CM

## 2022-04-02 DIAGNOSIS — G252 Other specified forms of tremor: Secondary | ICD-10-CM

## 2022-04-02 DIAGNOSIS — M7062 Trochanteric bursitis, left hip: Secondary | ICD-10-CM

## 2022-04-02 DIAGNOSIS — Z820 Family history of epilepsy and other diseases of the nervous system: Secondary | ICD-10-CM

## 2022-04-02 DIAGNOSIS — R413 Other amnesia: Secondary | ICD-10-CM

## 2022-04-02 DIAGNOSIS — Z8719 Personal history of other diseases of the digestive system: Secondary | ICD-10-CM

## 2022-04-02 DIAGNOSIS — M79671 Pain in right foot: Secondary | ICD-10-CM

## 2022-04-02 DIAGNOSIS — M19042 Primary osteoarthritis, left hand: Secondary | ICD-10-CM

## 2022-04-02 DIAGNOSIS — M0609 Rheumatoid arthritis without rheumatoid factor, multiple sites: Secondary | ICD-10-CM | POA: Diagnosis not present

## 2022-04-04 ENCOUNTER — Other Ambulatory Visit: Payer: Self-pay | Admitting: Pharmacist

## 2022-04-04 DIAGNOSIS — Z79899 Other long term (current) drug therapy: Secondary | ICD-10-CM

## 2022-04-04 DIAGNOSIS — M0609 Rheumatoid arthritis without rheumatoid factor, multiple sites: Secondary | ICD-10-CM

## 2022-04-04 DIAGNOSIS — Z111 Encounter for screening for respiratory tuberculosis: Secondary | ICD-10-CM

## 2022-04-04 NOTE — Progress Notes (Signed)
Received notification from [facility] that patient is due for updated [facility] infusion orders before 05/31/22.  Knox Saliva, PharmD, MPH, BCPS, CPP Clinical Pharmacist (Rheumatology and Pulmonology)

## 2022-04-05 ENCOUNTER — Ambulatory Visit (HOSPITAL_COMMUNITY)
Admission: RE | Admit: 2022-04-05 | Discharge: 2022-04-05 | Disposition: A | Discharge status: Home / Self Care | Payer: Medicare Other | Source: Ambulatory Visit | Attending: Rheumatology | Admitting: Rheumatology

## 2022-04-05 DIAGNOSIS — M0609 Rheumatoid arthritis without rheumatoid factor, multiple sites: Secondary | ICD-10-CM | POA: Insufficient documentation

## 2022-04-05 MED ORDER — DIPHENHYDRAMINE HCL 25 MG PO CAPS
ORAL_CAPSULE | ORAL | Status: AC
  Filled 2022-04-05: qty 1

## 2022-04-05 MED ORDER — ACETAMINOPHEN 325 MG PO TABS
650.0000 mg | ORAL_TABLET | ORAL | Status: DC
  Administered 2022-04-05: 650 mg via ORAL

## 2022-04-05 MED ORDER — SODIUM CHLORIDE 0.9 % IV SOLN
750.0000 mg | INTRAVENOUS | Status: DC
  Administered 2022-04-05: 750 mg via INTRAVENOUS
  Filled 2022-04-05: qty 30

## 2022-04-05 MED ORDER — ACETAMINOPHEN 325 MG PO TABS
ORAL_TABLET | ORAL | Status: AC
  Filled 2022-04-05: qty 2

## 2022-04-05 MED ORDER — DIPHENHYDRAMINE HCL 25 MG PO CAPS
25.0000 mg | ORAL_CAPSULE | ORAL | Status: DC
  Administered 2022-04-05: 25 mg via ORAL

## 2022-04-06 NOTE — Progress Notes (Signed)
Next infusion scheduled for [facility] IV on 05/31/22 and due for updated orders. Diagnosis: RA  Dose: '750mg'$  every 8 weeks (pt preference due to cost and disease is well-controlled on this regimen)  Last Clinic Visit: 03/30/22 Next Clinic Visit: 09/13/22  Last infusion: 04/05/22  Labs: 02/08/22 - wnl TB Gold: negative on 06/29/21  Orders placed for [facility] IV x 2 doses along with premedication of acetaminophen and diphenhydramine to be administered 30 minutes before medication infusion.  Standing CBC with diff/platelet and CMP with GFR orders placed to be drawn every 2 months.  Next TB gold due 06/29/22 (will place order to be drawn with October infusion)  Knox Saliva, PharmD, MPH, BCPS, CPP Clinical Pharmacist (Rheumatology and Pulmonology)

## 2022-05-03 NOTE — Progress Notes (Signed)
HPI M former smoker followed for asthma, allergic rhinitis, hx rhinosinusitis, nasal polyps complicated by rheumatoid arthritis/ Orencia, Bipolar, GERD, Alzheimers,. FENO- 08/07/16- 25 Office spirometry 08/07/16- WNL, FEV1 3.59/ 110%, R 0.81 PFT 11/01/16- WNL-FVC 5.30/123%, FEV1 4.0/124%, ratio 0.75, FEF 25-75% 3.49/135%, TLC 131%, DLCO 99% Quantiferon-TB Gold 08/12/19- NEG  ---------------------------------------------------------------    05/04/21- 71 year old male former smoker followed for Asthma, Allergic Rhinitis, history rhinosinusitis, nasal polyps, complicated by Rheumatoid Arthritis// Orencia, Bipolar, GERD, Alzheimers, -Pro-air HFA, Trelegy 100, Singulair,. Flonase, Covid vax-3 Phizer Breathing has done very well with no acute events. Occasional rescue inhaler. Likes Trelegy. Mentions urinary urgency w/o dysuria x 2 months. He is to contact Dr Lysle Rubens about this.  05/04/22-  71 year old male former smoker followed for Asthma, Allergic Rhinitis, history rhinosinusitis, nasal polyps, complicated by Rheumatoid Arthritis// Orencia, Bipolar, GERD, Alzheimers, -Pro-air HFA, Trelegy 100, Singulair,. Flonase, Covid vax-3 Phizer -----Pt f/u for asthma, no issues breathing to report. Trellegy is working well for pt.  He reports doing well with no acute concerns. Indicates there may be insurance coverage problem so we will have our staff reach out to him.  Asks Trelegy refill. No change in arthritis or neuropsych problems.  ROS-see HPI    + = positive Constitutional:   No-   weight loss, night sweats, fevers, chills, fatigue, lassitude. HEENT:   No-  headaches, difficulty swallowing, tooth/dental problems, sore throat,       No-  sneezing, itching, ear ache, nasal congestion,+ post nasal drip,  CV:  No-   chest pain, orthopnea, PND, swelling in lower extremities, anasarca, dizziness, palpitations Resp: No-   shortness of breath with exertion or at rest.                productive cough,    non-productive cough,   coughing up of blood.                 change in color of mucus.    wheezing.   Skin: No rash GI:  heartburn, indigestion, abdominal pain, nausea, vomiting, GU:  MS: +  joint pain or swelling.   Neuro-     nothing unusual Psych:  No- change in mood or affect. No depression or anxiety.  No memory loss.  OBJ General- Alert, Oriented, Affect-+ anxious, Distress- none acute, slender Skin-  Lymphadenopathy- none Head- atraumatic            Eyes- Gross vision intact, PERRLA, conjunctivae clear secretions            Ears- Hearing, canals-normal            Nose-  no-Septal dev, mucus, polyps-, erosion, perforation             Throat- Mallampati III , mucosa clear , drainage- none, tonsils- atrophic, Neck- flexible , trachea midline, no stridor , thyroid nl, carotid no bruit Chest - symmetrical excursion , unlabored           Heart/CV- RRR , no murmur , no gallop  , no rub, nl s1 s2                           - JVD- none , edema- none, stasis changes- none, varices- none           Lung- + clear,, wheeze- none, cough -none  dullness-none, rub- none           Chest wall-  Abd-  Br/ Gen/ Rectal- Not done, not indicated Extrem- cyanosis- none, clubbing, none, atrophy- none, strength- nl.                          +Synovial thickening of hand joints. Neuro- + tremor hands, some word-searching, ?flat facies

## 2022-05-04 ENCOUNTER — Encounter: Payer: Self-pay | Admitting: Internal Medicine

## 2022-05-04 ENCOUNTER — Ambulatory Visit: Payer: Medicare Other | Admitting: Internal Medicine

## 2022-05-04 VITALS — BP 98/62 | HR 55 | Ht 68.0 in | Wt 161.0 lb

## 2022-05-04 DIAGNOSIS — J302 Other seasonal allergic rhinitis: Secondary | ICD-10-CM

## 2022-05-04 DIAGNOSIS — J4531 Mild persistent asthma with (acute) exacerbation: Secondary | ICD-10-CM | POA: Diagnosis not present

## 2022-05-04 DIAGNOSIS — J4520 Mild intermittent asthma, uncomplicated: Secondary | ICD-10-CM

## 2022-05-04 DIAGNOSIS — J3089 Other allergic rhinitis: Secondary | ICD-10-CM | POA: Diagnosis not present

## 2022-05-04 MED ORDER — FLUTICASONE-UMECLIDIN-VILANT 100-62.5-25 MCG/ACT IN AEPB: INHALATION_SPRAY | RESPIRATORY_TRACT | 12 refills | Status: DC

## 2022-05-04 MED ORDER — TRELEGY ELLIPTA 100-62.5-25 MCG/ACT IN AEPB: 1.0000 | INHALATION_SPRAY | Freq: Every day | RESPIRATORY_TRACT | 0 refills | Status: DC

## 2022-05-04 NOTE — Assessment & Plan Note (Signed)
Interval use of Flonase when needed seems ok.

## 2022-05-04 NOTE — Assessment & Plan Note (Signed)
Uncomplicated with regular use of Trelegy. He expresses satisfaction with treatment plan. Plan- refill Trelegy

## 2022-05-04 NOTE — Patient Instructions (Signed)
Order- sample Trelegy 100  Trelegy script refilled  We can continue current meds  Please let us know if we can help

## 2022-05-14 ENCOUNTER — Ambulatory Visit: Payer: PRIVATE HEALTH INSURANCE | Admitting: Adult Health

## 2022-05-31 ENCOUNTER — Encounter (HOSPITAL_COMMUNITY): Payer: Medicare Other

## 2022-06-01 ENCOUNTER — Ambulatory Visit (HOSPITAL_COMMUNITY)
Admission: RE | Admit: 2022-06-01 | Discharge: 2022-06-01 | Disposition: A | Discharge status: Home / Self Care | Payer: Medicare Other | Source: Ambulatory Visit | Attending: Rheumatology | Admitting: Rheumatology

## 2022-06-01 ENCOUNTER — Other Ambulatory Visit: Payer: Self-pay | Admitting: Adult Health

## 2022-06-01 DIAGNOSIS — M0609 Rheumatoid arthritis without rheumatoid factor, multiple sites: Secondary | ICD-10-CM | POA: Insufficient documentation

## 2022-06-01 DIAGNOSIS — F411 Generalized anxiety disorder: Secondary | ICD-10-CM

## 2022-06-01 DIAGNOSIS — Z79899 Other long term (current) drug therapy: Secondary | ICD-10-CM | POA: Insufficient documentation

## 2022-06-01 DIAGNOSIS — F331 Major depressive disorder, recurrent, moderate: Secondary | ICD-10-CM

## 2022-06-01 DIAGNOSIS — F39 Unspecified mood [affective] disorder: Secondary | ICD-10-CM

## 2022-06-01 LAB — CBC WITH DIFFERENTIAL/PLATELET
Abs Immature Granulocytes: 0.01 10*3/uL (ref 0.00–0.07)
Basophils Absolute: 0 10*3/uL (ref 0.0–0.1)
Basophils Relative: 1 %
Eosinophils Absolute: 0 10*3/uL (ref 0.0–0.5)
Eosinophils Relative: 1 %
HCT: 41.5 % (ref 39.0–52.0)
Hemoglobin: 13.7 g/dL (ref 13.0–17.0)
Immature Granulocytes: 0 %
Lymphocytes Relative: 35 %
Lymphs Abs: 1.5 10*3/uL (ref 0.7–4.0)
MCH: 30.6 pg (ref 26.0–34.0)
MCHC: 33 g/dL (ref 30.0–36.0)
MCV: 92.6 fL (ref 80.0–100.0)
Monocytes Absolute: 0.5 10*3/uL (ref 0.1–1.0)
Monocytes Relative: 12 %
Neutro Abs: 2.2 10*3/uL (ref 1.7–7.7)
Neutrophils Relative %: 51 %
Platelets: 298 10*3/uL (ref 150–400)
RBC: 4.48 MIL/uL (ref 4.22–5.81)
RDW: 13.1 % (ref 11.5–15.5)
WBC: 4.3 10*3/uL (ref 4.0–10.5)
nRBC: 0 % (ref 0.0–0.2)

## 2022-06-01 LAB — COMPREHENSIVE METABOLIC PANEL
ALT: 16 U/L (ref 0–44)
AST: 19 U/L (ref 15–41)
Albumin: 4.1 g/dL (ref 3.5–5.0)
Alkaline Phosphatase: 61 U/L (ref 38–126)
Anion gap: 8 (ref 5–15)
BUN: 12 mg/dL (ref 8–23)
CO2: 27 mmol/L (ref 22–32)
Calcium: 9.9 mg/dL (ref 8.9–10.3)
Chloride: 105 mmol/L (ref 98–111)
Creatinine, Ser: 1.13 mg/dL (ref 0.61–1.24)
GFR, Estimated: >60 mL/min (ref >60)
Glucose, Bld: 64 mg/dL — ABNORMAL LOW (ref 70–99)
Potassium: 3.6 mmol/L (ref 3.5–5.1)
Sodium: 140 mmol/L (ref 135–145)
Total Bilirubin: 0.7 mg/dL (ref 0.3–1.2)
Total Protein: 7.5 g/dL (ref 6.5–8.1)

## 2022-06-01 MED ORDER — SODIUM CHLORIDE 0.9 % IV SOLN
750.0000 mg | INTRAVENOUS | Status: DC
  Administered 2022-06-01: 750 mg via INTRAVENOUS
  Filled 2022-06-01 (×2): qty 30

## 2022-06-01 MED ORDER — ACETAMINOPHEN 325 MG PO TABS: 650.0000 mg | ORAL_TABLET | ORAL | Status: DC

## 2022-06-01 MED ORDER — DIPHENHYDRAMINE HCL 25 MG PO CAPS: 25.0000 mg | ORAL_CAPSULE | ORAL | Status: DC

## 2022-06-01 NOTE — Telephone Encounter (Signed)
Please schedule appt

## 2022-06-01 NOTE — Progress Notes (Signed)
CBC and CMP normal except glucose is low.

## 2022-06-06 NOTE — Telephone Encounter (Signed)
Noted. Ty!

## 2022-06-06 NOTE — Telephone Encounter (Signed)
I called [name] [name]. He says he stopped taking the Abilify and is doing well. No refill needed. He also said he doesn't need to follow up.

## 2022-06-12 ENCOUNTER — Other Ambulatory Visit: Payer: Self-pay

## 2022-06-12 DIAGNOSIS — F411 Generalized anxiety disorder: Secondary | ICD-10-CM

## 2022-06-12 DIAGNOSIS — F331 Major depressive disorder, recurrent, moderate: Secondary | ICD-10-CM

## 2022-06-12 DIAGNOSIS — F39 Unspecified mood [affective] disorder: Secondary | ICD-10-CM

## 2022-06-12 MED ORDER — ARIPIPRAZOLE 5 MG PO TABS: ORAL_TABLET | ORAL | 0 refills | Status: DC

## 2022-06-14 DIAGNOSIS — Z0000 Encounter for general adult medical examination without abnormal findings: Secondary | ICD-10-CM | POA: Diagnosis not present

## 2022-06-14 DIAGNOSIS — F331 Major depressive disorder, recurrent, moderate: Secondary | ICD-10-CM | POA: Diagnosis not present

## 2022-06-14 DIAGNOSIS — M069 Rheumatoid arthritis, unspecified: Secondary | ICD-10-CM | POA: Diagnosis not present

## 2022-06-14 DIAGNOSIS — F319 Bipolar disorder, unspecified: Secondary | ICD-10-CM | POA: Diagnosis not present

## 2022-06-14 DIAGNOSIS — Z23 Encounter for immunization: Secondary | ICD-10-CM | POA: Diagnosis not present

## 2022-06-14 DIAGNOSIS — G3184 Mild cognitive impairment, so stated: Secondary | ICD-10-CM | POA: Diagnosis not present

## 2022-06-14 DIAGNOSIS — Z125 Encounter for screening for malignant neoplasm of prostate: Secondary | ICD-10-CM | POA: Diagnosis not present

## 2022-06-14 DIAGNOSIS — I10 Essential (primary) hypertension: Secondary | ICD-10-CM | POA: Diagnosis not present

## 2022-06-14 DIAGNOSIS — E785 Hyperlipidemia, unspecified: Secondary | ICD-10-CM | POA: Diagnosis not present

## 2022-07-27 ENCOUNTER — Inpatient Hospital Stay (HOSPITAL_COMMUNITY): Admission: RE | Admit: 2022-07-27 | Payer: Medicare Other | Source: Ambulatory Visit

## 2022-08-16 ENCOUNTER — Telehealth: Payer: Self-pay | Admitting: Internal Medicine

## 2022-08-16 MED ORDER — AMOXICILLIN-POT CLAVULANATE 875-125 MG PO TABS: 1.0000 | ORAL_TABLET | Freq: Two times a day (BID) | ORAL | 1 refills | Status: DC

## 2022-08-16 NOTE — Telephone Encounter (Signed)
Called and spoke to patient and went over recommendations from Dr Annamaria Boots. Patient voiced understanding. Verified pharmacy. Nothing further needed

## 2022-08-16 NOTE — Telephone Encounter (Signed)
Called and spoke to patient and he states for about 2 weeks now he is having a productive cough with green mucus noted. He states he also has a headache noted. He denies taking a covid test because this ain't covid and he knows what covid is. No fever noted. Patient denies taking anything over the counter for his cough.   Please advise sir

## 2022-08-16 NOTE — Telephone Encounter (Signed)
Aumentin 875 mg, # 14, 1 twice daily, refill x 1

## 2022-08-22 DIAGNOSIS — E785 Hyperlipidemia, unspecified: Secondary | ICD-10-CM | POA: Diagnosis not present

## 2022-08-22 DIAGNOSIS — J45909 Unspecified asthma, uncomplicated: Secondary | ICD-10-CM | POA: Diagnosis not present

## 2022-08-22 DIAGNOSIS — I10 Essential (primary) hypertension: Secondary | ICD-10-CM | POA: Diagnosis not present

## 2022-08-22 DIAGNOSIS — K219 Gastro-esophageal reflux disease without esophagitis: Secondary | ICD-10-CM | POA: Diagnosis not present

## 2022-09-03 NOTE — Progress Notes (Signed)
Office Visit Note  Patient: [name] [name]             Date of Birth: 1951/03/15           MRN: [id number]             PCP: Bo Merino, MD Referring: Wenda Low, MD Visit Date: 09/13/2022 Occupation: '@GUAROCC'$ @  Subjective:  Medication management  History of Present Illness: [name] [name] is a 71 y.o. male with a history of seronegative rheumatoid arthritis, and osteoarthritis.  He states he has been doing well on Orencia IV infusions every 2 months.  He states he received a letter from his insurance that Maureen Chatters would not be covered in the future.  He skipped his Orencia infusion in October.  He is scheduled to have next infusion in November.  He has done extremely well on Orencia without any joint pain or joint swelling.  He has not had any flares in a long time.  He denies any history of joint swelling or joint discomfort.  He denies lower back pain today.  Activities of Daily Living:  Patient reports morning stiffness for 0  none .   Patient Denies nocturnal pain.  Difficulty dressing/grooming: Denies Difficulty climbing stairs: Denies Difficulty getting out of chair: Denies Difficulty using hands for taps, buttons, cutlery, and/or writing: Reports  Review of Systems  Constitutional:  Negative for fatigue.  HENT:  Positive for mouth dryness. Negative for mouth sores.   Eyes:  Negative for dryness.  Respiratory:  Negative for shortness of breath.   Cardiovascular:  Negative for chest pain and palpitations.  Gastrointestinal:  Negative for blood in stool, constipation and diarrhea.  Endocrine: Negative for increased urination.  Genitourinary:  Negative for involuntary urination.  Musculoskeletal:  Negative for joint pain, gait problem, joint pain, joint swelling, myalgias, muscle weakness, morning stiffness, muscle tenderness and myalgias.  Skin:  Negative for color change, rash and sensitivity to sunlight.  Allergic/Immunologic: Negative for susceptible to  infections.  Neurological:  Negative for dizziness and headaches.  Hematological:  Negative for swollen glands.  Psychiatric/Behavioral:  Negative for depressed mood and sleep disturbance. The patient is not nervous/anxious.     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   Asthmatic bronchitis, mild intermittent, uncomplicated 32/67/1245   Herpes zoster 08/01/2013   Rheumatoid arthritis of multiple sites without rheumatoid factor ([place]) 09/18/2010   Polyp of nasal cavity 12/24/2007   Sinusitis, chronic 12/24/2007   Seasonal and perennial allergic rhinitis 12/24/2007   Allergic-infective asthma 12/24/2007   GERD (gastroesophageal reflux disease) 12/24/2007    Past Medical History:  Diagnosis Date   Allergic-infective asthma 12/24/2007   FENO- 08/07/16- 25 Office spirometry 08/07/16- WNL, FEV1 3.59/ 110%, R 0.81    Asthmatic bronchitis, mild intermittent, uncomplicated 80/99/8338   PFT 11/01/16- WNL-FVC 5.30/123%, FEV1 4.0/124%, ratio 0.75, FEF 25-75% 3.49/135%, TLC 131%, DLCO 99%  Last Assessment & Plan:  Doing well with Trelegy. Suggested protective mask if unavoidable dust.  Bipolar 1 disorder    Coarse tremors 12/29/2019   Upper extremities, bilateral   Diplopia 12/29/2019   Disorder of anterior pituitary 10/31/2021   Esotropia, intermittent 12/29/2019   Generalized anxiety disorder    GERD (gastroesophageal reflux disease) 12/24/2007   Herpes zoster  08/01/2013   October, 2014; right lateral chest   History of COVID-19 10/05/2021   History of IBS 05/29/2018   Major depressive disorder in remission 07/15/2017   Mild neurocognitive disorder 09/14/2020   Polyp of nasal cavity 12/24/2007   Regular astigmatism of both eyes 12/29/2019   Rheumatoid arthritis of multiple sites without rheumatoid factor 09/18/2010   Seasonal and perennial allergic rhinitis 12/24/2007   Sinusitis, chronic 12/24/2007   Recurrent acute maxillary and frontal sinusitis     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    Family History  Problem Relation Age of Onset   Multiple sclerosis Father    Breast cancer Mother    Breast cancer Sister    Alcoholism Child        now sober   Past Surgical History:  Procedure Laterality Date   CARPAL TUNNEL RELEASE Left    extensive sinus surgery with ablation of the frontal sinuses     HERNIA REPAIR     MOUTH SURGERY  11/2019   nasal polypectomies     TOOTH EXTRACTION     Social History   Social History Narrative   Right handed    Lives alone    Immunization History  Administered Date(s) Administered   Influenza Split 07/04/2009, 08/02/2011, 07/08/2012, 09/07/2012, 07/06/2013, 07/14/2015, 07/08/2016, 07/04/2020   Influenza, High Dose Seasonal PF 06/19/2017, 06/13/2018, 06/18/2019, 08/24/2019   Influenza,inj,Quad PF,6+ Mos 07/15/2014, 06/19/2017   Influenza-Unspecified 07/25/2016, 06/22/2022   PFIZER(Purple Top)SARS-COV-2 Vaccination 11/20/2019, 12/15/2019, 06/17/2020   Pneumococcal Conjugate-13 05/26/2018   Pneumococcal Polysaccharide-23 05/27/2006, 07/15/2014   Td 03/06/2007   Tdap 08/23/2012   Zoster Recombinat (Shingrix) 05/23/2018, 08/21/2018   Zoster, Live 04/24/2016, 05/20/2018, 07/30/2018     Objective: Vital Signs: BP (!) 143/78 (BP Location: Left Arm, Patient Position: Sitting, Cuff Size: Normal)   Pulse 72   Resp 16   Ht '5\' 8"'$  (1.727 m)   Wt 162 lb (73.5 kg)   BMI 24.63 kg/m     Physical Exam Vitals and nursing note reviewed.  Constitutional:      Appearance: He is well-developed.  HENT:     Head: Normocephalic and atraumatic.  Eyes:     Conjunctiva/sclera: Conjunctivae normal.     Pupils: Pupils are equal, round, and reactive to light.  Cardiovascular:     Rate and Rhythm: Normal rate and regular rhythm.     Heart sounds: Normal heart sounds.  Pulmonary:     Effort: Pulmonary effort is normal.     Breath sounds: Normal breath sounds.  Abdominal:     General: Bowel sounds are normal.     Palpations: Abdomen is soft.  Musculoskeletal:     Cervical back: Normal range of motion and neck supple.  Skin:    General: Skin is warm and dry.     Capillary Refill: Capillary refill takes less than 2 seconds.  Neurological:     Mental Status: He is alert and oriented to person, place, and time.  Psychiatric:        Behavior: Behavior normal.      Musculoskeletal Exam: He has good range of motion of the cervical spine.  Shoulder joints, elbow joints, wrist joints with good range of motion without discomfort.  He had bilateral ulnar deviation and MCP thickening.  PIP and DIP prominence was noted.  No synovitis was noted.  Hip joints and knee joints and good range of motion.  He had no tenderness over ankles or MTPs.  CDAI Exam: CDAI Score: -- Patient Global: 0 mm; Provider Global: 0 mm Swollen: --; Tender: -- Joint Exam 09/13/2022   No joint exam has been documented for this visit   There is currently no information documented on the homunculus. Go to the Rheumatology activity and complete the homunculus joint exam.  Investigation: No additional findings.  Imaging: No results found.  Recent Labs: Lab Results  Component Value Date   WBC 4.3 06/01/2022   HGB 13.7 06/01/2022   PLT 298 06/01/2022   NA 140 06/01/2022   K 3.6 06/01/2022   CL 105 06/01/2022   CO2 27 06/01/2022   GLUCOSE 64 (L) 06/01/2022   BUN 12 06/01/2022   CREATININE 1.13  06/01/2022   BILITOT 0.7 06/01/2022   ALKPHOS 61 06/01/2022   AST 19 06/01/2022   ALT 16 06/01/2022   PROT 7.5 06/01/2022   ALBUMIN 4.1 06/01/2022   CALCIUM 9.9 06/01/2022   GFRAA >60 05/23/2020   QFTBGOLD NEGATIVE 07/16/2017   QFTBGOLDPLUS Negative 06/29/2021    Speciality Comments: No specialty comments available.  Procedures:  No procedures performed Allergies: Simponi [golimumab], Aminophylline, Aspirin, Other, and Theophyllines   Assessment / Plan:     Visit Diagnoses: Rheumatoid arthritis of multiple sites without rheumatoid factor (HCC)-patient had no synovitis on examination.  He has been doing well on Orencia 750 mg IV infusions every 8 weeks.  He does not recall having a flare in a long time.  He denies any joint discomfort today.  Patient skipped his dose of Orencia in October due to insurance issues.  His last Orencia infusion was on June 01, 2022.  High risk medication use - Orencia 750 mg IV infusions every 8 weeks-spacing orencia due to recurrent infections.  Labs obtained on June 01, 2022 CBC and CMP were normal.  TB Gold was negative on June 29, 2021.  Patient will get next TB Gold with his infusion next week.  He will continue to get labs with infusions.  Information on immunization was placed in the AVS.  He was advised to hold Orencia if he develops an infection and resume after the infection resolves.  Primary osteoarthritis of both hands-joint protection muscle strengthening was discussed.  He has rheumatoid arthritis and osteoarthritis overlap with MCP thickening and ulnar deviation.  Chronic midline low back pain without sciatica-he denies any discomfort today.  Core strengthening exercises were emphasized.  The medical cells listed as follows:  Coarse tremors  History of depression  History of gastroesophageal reflux (GERD)  History of asthma  Family history of MS (multiple sclerosis)  Memory loss - he is followed by neurologist.  History of  IBS  Orders: No orders of the defined types were placed in this encounter.  No orders of the defined types were placed in this encounter.    Follow-Up Instructions: Return in about 5 months (around 02/12/2023) for Rheumatoid arthritis.   Bo Merino, MD  Note - This record has been created using Editor, commissioning.  Chart creation errors have been sought, but may not always  have been located. Such creation errors do not reflect on  the standard of medical care.

## 2022-09-05 ENCOUNTER — Telehealth: Payer: Self-pay | Admitting: Internal Medicine

## 2022-09-05 NOTE — Telephone Encounter (Signed)
Called and spoke with pt letting him know that we should get him in for an appt since he was still not cleared up fully after two different rounds of abx and he verbalized understanding. Appt scheduled for pt tomorrow 11/30 with CY. Nothing further needed.

## 2022-09-06 ENCOUNTER — Encounter: Payer: Self-pay | Admitting: Internal Medicine

## 2022-09-06 ENCOUNTER — Ambulatory Visit: Payer: Medicare Other | Admitting: Internal Medicine

## 2022-09-06 VITALS — BP 112/64 | HR 67 | Ht 68.0 in | Wt 167.0 lb

## 2022-09-06 DIAGNOSIS — B370 Candidal stomatitis: Secondary | ICD-10-CM | POA: Diagnosis not present

## 2022-09-06 DIAGNOSIS — J324 Chronic pansinusitis: Secondary | ICD-10-CM

## 2022-09-06 NOTE — Progress Notes (Signed)
HPI M former smoker followed for asthma, allergic rhinitis, hx rhinosinusitis, nasal polyps complicated by rheumatoid arthritis/ Orencia, Bipolar, GERD, Alzheimers,. FENO- 08/07/16- 25 Office spirometry 08/07/16- WNL, FEV1 3.59/ 110%, R 0.81 PFT 11/01/16- WNL-FVC 5.30/123%, FEV1 4.0/124%, ratio 0.75, FEF 25-75% 3.49/135%, TLC 131%, DLCO 99% Quantiferon-TB Gold 08/12/19- NEG  ---------------------------------------------------------------   05/04/22-  71 year old male former smoker followed for Asthma, Allergic Rhinitis, history rhinosinusitis, nasal polyps, complicated by Rheumatoid Arthritis// Orencia, Bipolar, GERD, Alzheimers, -Pro-air HFA, Trelegy 100, Singulair,. Flonase, Covid vax-3 Phizer -----Pt f/u for asthma, no issues breathing to report. Trellegy is working well for pt.  He reports doing well with no acute concerns. Indicates there may be insurance coverage problem so we will have our staff reach out to him.  Asks Trelegy refill. No change in arthritis or neuropsych problems.  09/06/22-  71 year old male former smoker followed for Asthma, Allergic Rhinitis, history rhinosinusitis, nasal polyps, complicated by Rheumatoid Arthritis// Orencia, Bipolar, GERD, Alzheimers, -Pro-air HFA, Trelegy 100, Singulair,. Flonase, Covid vax-3 Phizer Flu vax- had We sent augmentin 11/9,  ------Pt states sinus congestion and coughing is not clear after 2 rounds of antibiotics His PCP has sent nystatin for thrush after 2 rounds of Augmentin.  He says sinus infection symptoms are getting better "daily"-"80% better now".  Chest feels clear.   ROS-see HPI    + = positive Constitutional:   No-   weight loss, night sweats, fevers, chills, fatigue, lassitude. HEENT:   No-  headaches, difficulty swallowing, tooth/dental problems, sore throat,       No-  sneezing, itching, ear ache, nasal congestion,+ post nasal drip,  CV:  No-   chest pain, orthopnea, PND, swelling in lower extremities, anasarca,  dizziness, palpitations Resp: No-   shortness of breath with exertion or at rest.                productive cough,   non-productive cough,   coughing up of blood.                 change in color of mucus.    wheezing.   Skin: No rash GI:  heartburn, indigestion, abdominal pain, nausea, vomiting, GU:  MS: +  joint pain or swelling.   Neuro-     nothing unusual Psych:  No- change in mood or affect. No depression or anxiety.  No memory loss.  OBJ General- Alert, Oriented, Affect-+ anxious, Distress- none acute, slender Skin-  Lymphadenopathy- none Head- atraumatic            Eyes- Gross vision intact, PERRLA, conjunctivae clear secretions            Ears- Hearing, canals-normal            Nose-  no-Septal dev, mucus, polyps-, erosion, perforation             Throat- Mallampati III , mucosa+mild thrush, drainage- none, tonsils- atrophic, +hoarse Neck- flexible , trachea midline, no stridor , thyroid nl, carotid no bruit Chest - symmetrical excursion , unlabored           Heart/CV- RRR , no murmur , no gallop  , no rub, nl s1 s2                           - JVD- none , edema- none, stasis changes- none, varices- none           Lung- + clear, wheeze- none, cough -none  dullness-none, rub- none           Chest wall-  Abd-  Br/ Gen/ Rectal- Not done, not indicated Extrem- cyanosis- none, clubbing, none, atrophy- none, strength- nl.                          +Synovial thickening of hand joints. Neuro- + tremor hands, some word-searching, ?flat facies

## 2022-09-06 NOTE — Patient Instructions (Signed)
We will watch for now, but if you feel you are back-sliding on the infection let me know.

## 2022-09-07 ENCOUNTER — Ambulatory Visit: Payer: Medicare Other | Admitting: Physician Assistant

## 2022-09-13 ENCOUNTER — Ambulatory Visit: Payer: Medicare Other | Attending: Rheumatology | Admitting: Rheumatology

## 2022-09-13 ENCOUNTER — Encounter: Payer: Self-pay | Admitting: Rheumatology

## 2022-09-13 VITALS — BP 143/78 | HR 72 | Resp 16 | Ht 68.0 in | Wt 162.0 lb

## 2022-09-13 DIAGNOSIS — Z8659 Personal history of other mental and behavioral disorders: Secondary | ICD-10-CM

## 2022-09-13 DIAGNOSIS — Z8709 Personal history of other diseases of the respiratory system: Secondary | ICD-10-CM

## 2022-09-13 DIAGNOSIS — G252 Other specified forms of tremor: Secondary | ICD-10-CM

## 2022-09-13 DIAGNOSIS — R413 Other amnesia: Secondary | ICD-10-CM | POA: Diagnosis not present

## 2022-09-13 DIAGNOSIS — Z79899 Other long term (current) drug therapy: Secondary | ICD-10-CM

## 2022-09-13 DIAGNOSIS — M19041 Primary osteoarthritis, right hand: Secondary | ICD-10-CM

## 2022-09-13 DIAGNOSIS — M545 Low back pain, unspecified: Secondary | ICD-10-CM | POA: Diagnosis not present

## 2022-09-13 DIAGNOSIS — G8929 Other chronic pain: Secondary | ICD-10-CM | POA: Diagnosis not present

## 2022-09-13 DIAGNOSIS — Z8719 Personal history of other diseases of the digestive system: Secondary | ICD-10-CM

## 2022-09-13 DIAGNOSIS — M0609 Rheumatoid arthritis without rheumatoid factor, multiple sites: Secondary | ICD-10-CM | POA: Diagnosis not present

## 2022-09-13 DIAGNOSIS — M19042 Primary osteoarthritis, left hand: Secondary | ICD-10-CM

## 2022-09-13 DIAGNOSIS — Z820 Family history of epilepsy and other diseases of the nervous system: Secondary | ICD-10-CM

## 2022-09-13 NOTE — Patient Instructions (Signed)

## 2022-09-17 ENCOUNTER — Encounter: Payer: Self-pay | Admitting: Internal Medicine

## 2022-09-17 ENCOUNTER — Inpatient Hospital Stay (HOSPITAL_COMMUNITY): Admission: RE | Admit: 2022-09-17 | Payer: Medicare Other | Source: Ambulatory Visit

## 2022-09-17 DIAGNOSIS — B370 Candidal stomatitis: Secondary | ICD-10-CM | POA: Insufficient documentation

## 2022-09-17 NOTE — Assessment & Plan Note (Signed)
Recurrent problem despite surgical ablation of the sinus cavities. Plan-watch for now after recent antibiotics.  If he stops improving we may need to send something else.

## 2022-09-17 NOTE — Assessment & Plan Note (Signed)
Now on nystatin.  We are holding further antibiotics now and hopefully this will clear.

## 2022-09-18 ENCOUNTER — Telehealth: Payer: Self-pay | Admitting: Pharmacist

## 2022-09-18 NOTE — Telephone Encounter (Addendum)
Please reinvestigate benefits for IV Orencia.  Orencia - [id number], [id number]  Dose: '750mg'$  SQ every 8 weeks (pt preference due to cost and disease is well-controlled on this regimen)   Dx: Rheumatoid arthritis (M05.9)  Knox Saliva, PharmD, MPH, BCPS, CPP Clinical Pharmacist (Rheumatology and Pulmonology)  ----- Message from [name] [name], [place] sent at 09/13/2022  2:14 PM EST ----- Patient received a letter that [name] [name] IV will not be covered in the future. Patient is scheduled for an infusion next week. Can you please check into this?   Also, per Dr. Estanislado Pandy, please place TB Gold order to be drawn with next infusion. Thanks!

## 2022-09-19 ENCOUNTER — Telehealth: Payer: Self-pay | Admitting: Internal Medicine

## 2022-09-19 MED ORDER — FLUCONAZOLE 150 MG PO TABS: 150.0000 mg | ORAL_TABLET | Freq: Every day | ORAL | 0 refills | Status: DC

## 2022-09-19 NOTE — Telephone Encounter (Signed)
I called patient regarding the letter he received. He stated there was some confusion but he will call [facility] to r/s his Orencia infusion appt.  Submitted via [facility] portal today. Authorization has been approved from 09/19/2022 through 09/20/2023. Approval letter sent to media tab of chart.  Auth # [id number]  Knox Saliva, PharmD, MPH, BCPS, CPP Clinical Pharmacist (Rheumatology and Pulmonology)

## 2022-09-19 NOTE — Telephone Encounter (Signed)
Called and spoke to patient and went over new medication that Dr young was sending in. Went over directions of medication and pharmacy. Nothing further needed

## 2022-09-19 NOTE — Telephone Encounter (Signed)
Please send Difucan 150 mg, # 5, 1 daily

## 2022-09-19 NOTE — Telephone Encounter (Signed)
Called patient and he states that he would like something called in for thrush. He states that he is currently on Nystatin suspension but it is not helping.   Please advise sir

## 2022-09-26 ENCOUNTER — Other Ambulatory Visit: Payer: Self-pay | Admitting: Pharmacist

## 2022-09-26 ENCOUNTER — Ambulatory Visit (HOSPITAL_COMMUNITY)
Admission: RE | Admit: 2022-09-26 | Discharge: 2022-09-26 | Disposition: A | Discharge status: Home / Self Care | Payer: Medicare Other | Source: Ambulatory Visit | Attending: Rheumatology | Admitting: Rheumatology

## 2022-09-26 DIAGNOSIS — Z79899 Other long term (current) drug therapy: Secondary | ICD-10-CM

## 2022-09-26 DIAGNOSIS — M0609 Rheumatoid arthritis without rheumatoid factor, multiple sites: Secondary | ICD-10-CM | POA: Diagnosis not present

## 2022-09-26 DIAGNOSIS — Z111 Encounter for screening for respiratory tuberculosis: Secondary | ICD-10-CM | POA: Insufficient documentation

## 2022-09-26 LAB — CBC WITH DIFFERENTIAL/PLATELET
Abs Immature Granulocytes: 0.01 10*3/uL (ref 0.00–0.07)
Basophils Absolute: 0 10*3/uL (ref 0.0–0.1)
Basophils Relative: 1 %
Eosinophils Absolute: 0.1 10*3/uL (ref 0.0–0.5)
Eosinophils Relative: 1 %
HCT: 41 % (ref 39.0–52.0)
Hemoglobin: 13.7 g/dL (ref 13.0–17.0)
Immature Granulocytes: 0 %
Lymphocytes Relative: 36 %
Lymphs Abs: 1.5 10*3/uL (ref 0.7–4.0)
MCH: 30.8 pg (ref 26.0–34.0)
MCHC: 33.4 g/dL (ref 30.0–36.0)
MCV: 92.1 fL (ref 80.0–100.0)
Monocytes Absolute: 0.6 10*3/uL (ref 0.1–1.0)
Monocytes Relative: 16 %
Neutro Abs: 1.9 10*3/uL (ref 1.7–7.7)
Neutrophils Relative %: 46 %
Platelets: 271 10*3/uL (ref 150–400)
RBC: 4.45 MIL/uL (ref 4.22–5.81)
RDW: 13.2 % (ref 11.5–15.5)
WBC: 4.1 10*3/uL (ref 4.0–10.5)
nRBC: 0 % (ref 0.0–0.2)

## 2022-09-26 LAB — COMPREHENSIVE METABOLIC PANEL
ALT: 19 U/L (ref 0–44)
AST: 25 U/L (ref 15–41)
Albumin: 3.8 g/dL (ref 3.5–5.0)
Alkaline Phosphatase: 61 U/L (ref 38–126)
Anion gap: 7 (ref 5–15)
BUN: 13 mg/dL (ref 8–23)
CO2: 26 mmol/L (ref 22–32)
Calcium: 9.3 mg/dL (ref 8.9–10.3)
Chloride: 107 mmol/L (ref 98–111)
Creatinine, Ser: 1.3 mg/dL — ABNORMAL HIGH (ref 0.61–1.24)
GFR, Estimated: 59 mL/min — ABNORMAL LOW (ref >60)
Glucose, Bld: 103 mg/dL — ABNORMAL HIGH (ref 70–99)
Potassium: 3.4 mmol/L — ABNORMAL LOW (ref 3.5–5.1)
Sodium: 140 mmol/L (ref 135–145)
Total Bilirubin: 0.8 mg/dL (ref 0.3–1.2)
Total Protein: 7.5 g/dL (ref 6.5–8.1)

## 2022-09-26 MED ORDER — ACETAMINOPHEN 325 MG PO TABS: 650.0000 mg | ORAL_TABLET | ORAL | Status: DC

## 2022-09-26 MED ORDER — SODIUM CHLORIDE 0.9 % IV SOLN
750.0000 mg | INTRAVENOUS | Status: AC
  Administered 2022-09-26: 750 mg via INTRAVENOUS
  Filled 2022-09-26: qty 30

## 2022-09-26 MED ORDER — DIPHENHYDRAMINE HCL 25 MG PO CAPS: 25.0000 mg | ORAL_CAPSULE | ORAL | Status: DC

## 2022-09-26 NOTE — Progress Notes (Signed)
Creatinine is elevated-1.30 and GFR is borderline low-59.  Please clarify if he has been taking any NSAIDs or if he has had any other medication changes recently?  Potassium is borderline low-3.4.

## 2022-09-26 NOTE — Progress Notes (Signed)
Next infusion scheduled for Orencia IV on 11/21/2022 and due for updated orders. Diagnosis:  Dose: '750mg'$  every 8 weeks  Last Clinic Visit: 09/13/2022 Next Clinic Visit: 03/08/2023  Last infusion: 09/26/2022  Labs: 09/26/2022 - CBC/CMP stable TB Gold: negative on 09/26/2022   Orders placed for Orencia IV x 2 doses along with premedication of acetaminophen and diphenhydramine to be administered 30 minutes before medication infusion.  Standing CBC with diff/platelet and CMP with GFR orders placed to be drawn every 2 months.  Next TB gold due 09/27/2023  Knox Saliva, PharmD, MPH, BCPS, CPP Clinical Pharmacist (Rheumatology and Pulmonology)

## 2022-09-26 NOTE — Progress Notes (Signed)
CBC WNL

## 2022-10-02 LAB — QUANTIFERON-TB GOLD PLUS (RQFGPL)
QuantiFERON Mitogen Value: 2.04 IU/mL
QuantiFERON Nil Value: 0.08 IU/mL
QuantiFERON TB1 Ag Value: 0.08 IU/mL
QuantiFERON TB2 Ag Value: 0.08 IU/mL

## 2022-10-02 LAB — QUANTIFERON-TB GOLD PLUS: QuantiFERON-TB Gold Plus: NEGATIVE

## 2022-10-03 NOTE — Progress Notes (Signed)
TB gold negative

## 2022-10-10 ENCOUNTER — Telehealth: Payer: Self-pay | Admitting: Internal Medicine

## 2022-10-10 NOTE — Telephone Encounter (Signed)
Called and spoke to patient. Could not find a note in his chart where we called or any results that needed to be gone over. Pt said he is doing okay and didn't need anything. Advised him to call back when he needs [place]. Nothing further needed at this time.

## 2022-10-24 DIAGNOSIS — L538 Other specified erythematous conditions: Secondary | ICD-10-CM | POA: Diagnosis not present

## 2022-10-24 DIAGNOSIS — L570 Actinic keratosis: Secondary | ICD-10-CM | POA: Diagnosis not present

## 2022-10-24 DIAGNOSIS — L02821 Furuncle of head [any part, except face]: Secondary | ICD-10-CM | POA: Diagnosis not present

## 2022-10-24 DIAGNOSIS — L820 Inflamed seborrheic keratosis: Secondary | ICD-10-CM | POA: Diagnosis not present

## 2022-10-24 DIAGNOSIS — L821 Other seborrheic keratosis: Secondary | ICD-10-CM | POA: Diagnosis not present

## 2022-11-02 ENCOUNTER — Telehealth: Payer: Self-pay | Admitting: Pharmacist

## 2022-11-02 NOTE — Telephone Encounter (Signed)
Patient receives Orencia [facility] infusions at [facility].  He has [facility] Medicare as primary and BCBS supplement as secondary. Authorization though [facility] Medicare is on file through 09/20/2023. Patient no longer has BCBS supplemental plan as confrmed by insurance rep and patient. [employer] picks up 80% of coinsurance for infusions. Patient is responsible for 20% coinsurance.  Knox Saliva, PharmD, MPH, BCPS, CPP Clinical Pharmacist (Rheumatology and Pulmonology)

## 2022-11-12 ENCOUNTER — Ambulatory Visit (HOSPITAL_COMMUNITY): Payer: Medicare Other

## 2022-11-21 ENCOUNTER — Ambulatory Visit (HOSPITAL_COMMUNITY)
Admission: RE | Admit: 2022-11-21 | Discharge: 2022-11-21 | Disposition: A | Discharge status: Home / Self Care | Payer: Medicare Other | Source: Ambulatory Visit | Attending: Rheumatology | Admitting: Rheumatology

## 2022-11-21 DIAGNOSIS — Z79899 Other long term (current) drug therapy: Secondary | ICD-10-CM | POA: Insufficient documentation

## 2022-11-21 DIAGNOSIS — M0609 Rheumatoid arthritis without rheumatoid factor, multiple sites: Secondary | ICD-10-CM | POA: Diagnosis not present

## 2022-11-21 LAB — CBC WITH DIFFERENTIAL/PLATELET
Abs Immature Granulocytes: 0.01 10*3/uL (ref 0.00–0.07)
Basophils Absolute: 0 10*3/uL (ref 0.0–0.1)
Basophils Relative: 1 %
Eosinophils Absolute: 0 10*3/uL (ref 0.0–0.5)
Eosinophils Relative: 1 %
HCT: 40.3 % (ref 39.0–52.0)
Hemoglobin: 13.4 g/dL (ref 13.0–17.0)
Immature Granulocytes: 0 %
Lymphocytes Relative: 37 %
Lymphs Abs: 1.7 10*3/uL (ref 0.7–4.0)
MCH: 31 pg (ref 26.0–34.0)
MCHC: 33.3 g/dL (ref 30.0–36.0)
MCV: 93.3 fL (ref 80.0–100.0)
Monocytes Absolute: 0.6 10*3/uL (ref 0.1–1.0)
Monocytes Relative: 13 %
Neutro Abs: 2.1 10*3/uL (ref 1.7–7.7)
Neutrophils Relative %: 48 %
Platelets: 287 10*3/uL (ref 150–400)
RBC: 4.32 MIL/uL (ref 4.22–5.81)
RDW: 13.4 % (ref 11.5–15.5)
WBC: 4.4 10*3/uL (ref 4.0–10.5)
nRBC: 0 % (ref 0.0–0.2)

## 2022-11-21 LAB — COMPREHENSIVE METABOLIC PANEL
ALT: 18 U/L (ref 0–44)
AST: 24 U/L (ref 15–41)
Albumin: 3.8 g/dL (ref 3.5–5.0)
Alkaline Phosphatase: 55 U/L (ref 38–126)
Anion gap: 8 (ref 5–15)
BUN: 14 mg/dL (ref 8–23)
CO2: 26 mmol/L (ref 22–32)
Calcium: 9.5 mg/dL (ref 8.9–10.3)
Chloride: 104 mmol/L (ref 98–111)
Creatinine, Ser: 1.06 mg/dL (ref 0.61–1.24)
GFR, Estimated: >60 mL/min (ref >60)
Glucose, Bld: 79 mg/dL (ref 70–99)
Potassium: 3.7 mmol/L (ref 3.5–5.1)
Sodium: 138 mmol/L (ref 135–145)
Total Bilirubin: 0.9 mg/dL (ref 0.3–1.2)
Total Protein: 7.2 g/dL (ref 6.5–8.1)

## 2022-11-21 MED ORDER — SODIUM CHLORIDE 0.9 % IV SOLN
750.0000 mg | INTRAVENOUS | Status: DC
  Administered 2022-11-21: 750 mg via INTRAVENOUS
  Filled 2022-11-21: qty 30

## 2022-11-21 MED ORDER — DIPHENHYDRAMINE HCL 25 MG PO CAPS: 25.0000 mg | ORAL_CAPSULE | ORAL | Status: DC

## 2022-11-21 MED ORDER — ACETAMINOPHEN 325 MG PO TABS: 650.0000 mg | ORAL_TABLET | ORAL | Status: DC

## 2022-11-21 NOTE — Progress Notes (Unsigned)
Office Visit Note  Patient: [name] [name]             Date of Birth: 12/20/1950           MRN: [id number]             PCP: Bo Merino, MD Referring: Bo Merino, MD Visit Date: 11/22/2022 Occupation: @GUAROCC$ @  Subjective:  No chief complaint on file.   History of Present Illness: [name] [name] is a 72 y.o. male ***     Activities of Daily Living:  Patient reports morning stiffness for *** {minute/hour:19697}.   Patient {ACTIONS;DENIES/REPORTS:21021675::"Denies"} nocturnal pain.  Difficulty dressing/grooming: {ACTIONS;DENIES/REPORTS:21021675::"Denies"} Difficulty climbing stairs: {ACTIONS;DENIES/REPORTS:21021675::"Denies"} Difficulty getting out of chair: {ACTIONS;DENIES/REPORTS:21021675::"Denies"} Difficulty using hands for taps, buttons, cutlery, and/or writing: {ACTIONS;DENIES/REPORTS:21021675::"Denies"}  No Rheumatology ROS completed.   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   Asthmatic bronchitis, mild intermittent, uncomplicated [id number]   Herpes zoster 08/01/2013   Rheumatoid arthritis of multiple sites without rheumatoid factor ([place]) 09/18/2010   Polyp of nasal cavity 12/24/2007   Sinusitis, chronic 12/24/2007   Seasonal and perennial allergic rhinitis 12/24/2007   Allergic-infective asthma 12/24/2007   GERD (gastroesophageal reflux disease) 12/24/2007    Past Medical History:  Diagnosis Date    Allergic-infective asthma 12/24/2007   FENO- 08/07/16- 25 Office spirometry 08/07/16- WNL, FEV1 3.59/ 110%, R 0.81    Asthmatic bronchitis, mild intermittent, uncomplicated [id number]   PFT 11/01/16- WNL-FVC 5.30/123%, FEV1 4.0/124%, ratio 0.75, FEF 25-75% 3.49/135%, TLC 131%, DLCO 99%  Last Assessment & Plan:  Doing well with Trelegy. Suggested protective mask if unavoidable dust.   Bipolar 1 disorder    Coarse tremors 12/29/2019   Upper extremities, bilateral   Diplopia 12/29/2019   Disorder of anterior pituitary 10/31/2021   Esotropia, intermittent 12/29/2019   Generalized anxiety disorder    GERD (gastroesophageal reflux disease) 12/24/2007   Herpes zoster 08/01/2013   October, 2014; right lateral chest   History of COVID-19 10/05/2021   History of IBS 05/29/2018   Major depressive disorder in remission 07/15/2017   Mild neurocognitive disorder 09/14/2020   Polyp of nasal cavity 12/24/2007   Regular astigmatism of both eyes 12/29/2019   Rheumatoid arthritis of multiple sites without rheumatoid factor 09/18/2010   Seasonal and perennial allergic rhinitis 12/24/2007   Sinusitis, chronic 12/24/2007   Recurrent acute maxillary and frontal sinusitis     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    Family History  Problem Relation Age of Onset   Multiple sclerosis Father    Breast cancer Mother    Breast cancer Sister    Alcoholism Child        now sober   Past Surgical History:  Procedure Laterality Date   CARPAL TUNNEL RELEASE Left    extensive sinus surgery with ablation of the frontal sinuses     HERNIA REPAIR     MOUTH SURGERY  11/2019   nasal polypectomies     TOOTH EXTRACTION  Social History   Social History Narrative   Right handed    Lives alone    Immunization History  Administered Date(s) Administered   Influenza Split 07/04/2009, 08/02/2011, 07/08/2012, 09/07/2012, 07/06/2013, 07/14/2015, 07/08/2016, 07/04/2020   Influenza, High Dose  Seasonal PF 06/19/2017, 06/13/2018, 06/18/2019, 08/24/2019   Influenza,inj,Quad PF,6+ Mos 07/15/2014, 06/19/2017   Influenza-Unspecified 07/25/2016, 06/22/2022   PFIZER(Purple Top)SARS-COV-2 Vaccination 11/20/2019, 12/15/2019, 06/17/2020   Pneumococcal Conjugate-13 05/26/2018   Pneumococcal Polysaccharide-23 05/27/2006, 07/15/2014   Td 03/06/2007   Tdap 08/23/2012   Zoster Recombinat (Shingrix) 05/23/2018, 08/21/2018   Zoster, Live 04/24/2016, 05/20/2018, 07/30/2018     Objective: Vital Signs: There were no vitals taken for this visit.   Physical Exam   Musculoskeletal Exam: ***  CDAI Exam: CDAI Score: -- Patient Global: --; Provider Global: -- Swollen: --; Tender: -- Joint Exam 11/22/2022   No joint exam has been documented for this visit   There is currently no information documented on the homunculus. Go to the Rheumatology activity and complete the homunculus joint exam.  Investigation: No additional findings.  Imaging: No results found.  Recent Labs: Lab Results  Component Value Date   WBC 4.4 11/21/2022   HGB 13.4 11/21/2022   PLT 287 11/21/2022   NA 138 11/21/2022   K 3.7 11/21/2022   CL 104 11/21/2022   CO2 26 11/21/2022   GLUCOSE 79 11/21/2022   BUN 14 11/21/2022   CREATININE 1.06 11/21/2022   BILITOT 0.9 11/21/2022   ALKPHOS 55 11/21/2022   AST 24 11/21/2022   ALT 18 11/21/2022   PROT 7.2 11/21/2022   ALBUMIN 3.8 11/21/2022   CALCIUM 9.5 11/21/2022   GFRAA >60 05/23/2020   QFTBGOLD NEGATIVE 07/16/2017   QFTBGOLDPLUS Negative 09/26/2022    Speciality Comments: No specialty comments available.  Procedures:  No procedures performed Allergies: Simponi [golimumab], Aminophylline, Aspirin, Other, and Theophyllines   Assessment / Plan:     Visit Diagnoses: Rheumatoid arthritis of multiple sites without rheumatoid factor (HCC)  High risk medication use  Primary osteoarthritis of both hands  Chronic midline low back pain without  sciatica  History of IBS  History of gastroesophageal reflux (GERD)  History of asthma  Coarse tremors  History of depression  Family history of MS (multiple sclerosis)  Memory loss  Orders: No orders of the defined types were placed in this encounter.  No orders of the defined types were placed in this encounter.   Face-to-face time spent with patient was *** minutes. Greater than 50% of time was spent in counseling and coordination of care.  Follow-Up Instructions: No follow-ups on file.   Bo Merino, MD  Note - This record has been created using Editor, commissioning.  Chart creation errors have been sought, but may not always  have been located. Such creation errors do not reflect on  the standard of medical care.

## 2022-11-21 NOTE — Progress Notes (Signed)
CBC WNL

## 2022-11-21 NOTE — Progress Notes (Signed)
CMP Wnl

## 2022-11-22 ENCOUNTER — Ambulatory Visit: Payer: Medicare Other | Attending: Rheumatology | Admitting: Rheumatology

## 2022-11-22 ENCOUNTER — Encounter: Payer: Self-pay | Admitting: Rheumatology

## 2022-11-22 VITALS — BP 123/78 | HR 60 | Resp 16 | Ht 67.0 in | Wt 167.0 lb

## 2022-11-22 DIAGNOSIS — G8929 Other chronic pain: Secondary | ICD-10-CM

## 2022-11-22 DIAGNOSIS — H532 Diplopia: Secondary | ICD-10-CM

## 2022-11-22 DIAGNOSIS — Z8719 Personal history of other diseases of the digestive system: Secondary | ICD-10-CM | POA: Diagnosis not present

## 2022-11-22 DIAGNOSIS — Z8709 Personal history of other diseases of the respiratory system: Secondary | ICD-10-CM | POA: Diagnosis not present

## 2022-11-22 DIAGNOSIS — M19042 Primary osteoarthritis, left hand: Secondary | ICD-10-CM

## 2022-11-22 DIAGNOSIS — G252 Other specified forms of tremor: Secondary | ICD-10-CM | POA: Diagnosis not present

## 2022-11-22 DIAGNOSIS — M545 Low back pain, unspecified: Secondary | ICD-10-CM | POA: Diagnosis not present

## 2022-11-22 DIAGNOSIS — M0609 Rheumatoid arthritis without rheumatoid factor, multiple sites: Secondary | ICD-10-CM

## 2022-11-22 DIAGNOSIS — M19041 Primary osteoarthritis, right hand: Secondary | ICD-10-CM | POA: Diagnosis not present

## 2022-11-22 DIAGNOSIS — R413 Other amnesia: Secondary | ICD-10-CM

## 2022-11-22 DIAGNOSIS — Z79899 Other long term (current) drug therapy: Secondary | ICD-10-CM | POA: Diagnosis not present

## 2022-11-22 DIAGNOSIS — Z8659 Personal history of other mental and behavioral disorders: Secondary | ICD-10-CM

## 2022-11-22 DIAGNOSIS — Z820 Family history of epilepsy and other diseases of the nervous system: Secondary | ICD-10-CM

## 2022-11-22 NOTE — Patient Instructions (Signed)
Vaccines You are taking a medication(s) that can suppress your immune system.  The following immunizations are recommended: Flu annually Covid-19  RSV Td/Tdap (tetanus, diphtheria, pertussis) every 10 years Pneumonia (Prevnar 15 then Pneumovax 23 at least 1 year apart.  Alternatively, can take Prevnar 20 without needing additional dose) Shingrix: 2 doses from 4 weeks to 6 months apart  Please check with your PCP to make sure you are up to date.   If you have signs or symptoms of an infection or start antibiotics: First, call your PCP for workup of your infection. Hold your medication through the infection, until you complete your antibiotics, and until symptoms resolve if you take the following: Injectable medication (Actemra, Benlysta, Cimzia, Cosentyx, Enbrel, Humira, Kevzara, Orencia, Remicade, Simponi, Stelara, Taltz, Tremfya) Methotrexate Leflunomide (Arava) Mycophenolate (Cellcept) Morrie Sheldon, Olumiant, or Rinvoq

## 2022-11-26 ENCOUNTER — Telehealth: Payer: Self-pay | Admitting: Pharmacist

## 2022-11-26 NOTE — Telephone Encounter (Signed)
Received BMS form from [employer] requesting prescriber signature for Orencia patient assistance application. Form completed and email back to [employer].  Knox Saliva, PharmD, MPH, BCPS, CPP Clinical Pharmacist (Rheumatology and Pulmonology)

## 2022-11-27 DIAGNOSIS — B370 Candidal stomatitis: Secondary | ICD-10-CM | POA: Diagnosis not present

## 2022-11-27 DIAGNOSIS — D84821 Immunodeficiency due to drugs: Secondary | ICD-10-CM | POA: Diagnosis not present

## 2022-11-27 DIAGNOSIS — Z79620 Long term (current) use of immunosuppressive biologic: Secondary | ICD-10-CM | POA: Diagnosis not present

## 2022-11-29 DIAGNOSIS — H532 Diplopia: Secondary | ICD-10-CM | POA: Diagnosis not present

## 2022-11-29 DIAGNOSIS — H5000 Unspecified esotropia: Secondary | ICD-10-CM | POA: Diagnosis not present

## 2022-11-29 DIAGNOSIS — H2513 Age-related nuclear cataract, bilateral: Secondary | ICD-10-CM | POA: Diagnosis not present

## 2022-12-20 DIAGNOSIS — L814 Other melanin hyperpigmentation: Secondary | ICD-10-CM | POA: Diagnosis not present

## 2022-12-20 DIAGNOSIS — L02821 Furuncle of head [any part, except face]: Secondary | ICD-10-CM | POA: Diagnosis not present

## 2022-12-20 DIAGNOSIS — D225 Melanocytic nevi of trunk: Secondary | ICD-10-CM | POA: Diagnosis not present

## 2022-12-20 DIAGNOSIS — L570 Actinic keratosis: Secondary | ICD-10-CM | POA: Diagnosis not present

## 2022-12-20 DIAGNOSIS — L821 Other seborrheic keratosis: Secondary | ICD-10-CM | POA: Diagnosis not present

## 2023-01-16 ENCOUNTER — Encounter (HOSPITAL_COMMUNITY): Payer: Medicare Other

## 2023-02-22 NOTE — Progress Notes (Signed)
Office Visit Note  Patient: [name] [name]             Date of Birth: 03/28/1951           MRN: [id number]             PCP: Pollyann Savoy, MD Referring: Pollyann Savoy, MD Visit Date: 03/08/2023 Occupation: @GUAROCC @  Subjective:  Medication management  History of Present Illness: [name] [name] is a 72 y.o. male with seronegative rheumatoid arthritis and osteoarthritis.  He has been off [place] since November 21, 2022.  He had been getting Orencia 750 mg IV infusions every 2 months.  Patient wants to discontinue Orencia infusions due to frequent infections.  He states he has been had several infections over the last year which she describes as bronchitis, sinusitis, ear infection and a scalp infection.  He states he had a discussion with his PCP who recommended coming off Orencia due to frequent infections.  He was also evaluated by Dr. Charlton Haws recently for double vision.  According to Dr. Laruth Bouchard note diplopia with sensory motor.  He also diagnosed him with esotropia and nuclear sclerosis.  Patient has been doing well and does not have any joint pain or joint swelling currently.  Patient states that he uses a treadmill and lifts weights on a regular basis.    Activities of Daily Living:  Patient reports morning stiffness for 0 minutes.   Patient Denies nocturnal pain.  Difficulty dressing/grooming: Denies Difficulty climbing stairs: Denies Difficulty getting out of chair: Denies Difficulty using hands for taps, buttons, cutlery, and/or writing: Reports  Review of Systems  Constitutional:  Negative for fatigue.  HENT:  Positive for sore tongue. Negative for mouth sores and mouth dryness.   Eyes:  Negative for dryness.  Respiratory:  Positive for shortness of breath and wheezing.   Cardiovascular:  Negative for chest pain and palpitations.  Gastrointestinal:  Negative for blood in stool, constipation and diarrhea.  Endocrine: Negative for increased urination.   Genitourinary:  Negative for involuntary urination.  Musculoskeletal:  Negative for joint pain, gait problem, joint pain, joint swelling, myalgias, muscle weakness, morning stiffness, muscle tenderness and myalgias.  Skin:  Positive for rash and hair loss. Negative for color change and sensitivity to sunlight.  Allergic/Immunologic: Positive for susceptible to infections.  Neurological:  Negative for dizziness and headaches.  Hematological:  Negative for swollen glands.  Psychiatric/Behavioral:  Negative for depressed mood and sleep disturbance. The patient is not nervous/anxious.     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   Asthmatic bronchitis, mild intermittent, uncomplicated 02/12/2017   Herpes zoster 08/01/2013   Rheumatoid arthritis of multiple sites without rheumatoid factor (HCC) 09/18/2010   Polyp of nasal cavity 12/24/2007   Sinusitis, chronic 12/24/2007   Seasonal and perennial allergic rhinitis 12/24/2007   Allergic-infective asthma 12/24/2007   GERD (gastroesophageal reflux disease) 12/24/2007    Past Medical History:  Diagnosis Date   Allergic-infective asthma 12/24/2007  FENO- 08/07/16- 25 Office spirometry 08/07/16- WNL, FEV1 3.59/ 110%, R 0.81    Asthmatic bronchitis, mild intermittent, uncomplicated 02/12/2017   PFT 11/01/16- WNL-FVC 5.30/123%, FEV1 4.0/124%, ratio 0.75, FEF 25-75% 3.49/135%, TLC 131%, DLCO 99%  Last  Assessment & Plan:  Doing well with Trelegy. Suggested protective mask if unavoidable dust.   Bipolar 1 disorder    Coarse tremors 12/29/2019   Upper extremities, bilateral   Diplopia 12/29/2019   Disorder of anterior pituitary 10/31/2021   Esotropia, intermittent 12/29/2019   Generalized anxiety disorder    GERD (gastroesophageal reflux disease) 12/24/2007   Herpes zoster 08/01/2013   October, 2014; right lateral chest   History of COVID-19 10/05/2021   History of IBS 05/29/2018   Major depressive disorder in remission 07/15/2017   Mild neurocognitive disorder 09/14/2020   Polyp of nasal cavity 12/24/2007   Regular astigmatism of both eyes 12/29/2019   Rheumatoid arthritis of multiple sites without rheumatoid factor 09/18/2010   Seasonal and perennial allergic rhinitis 12/24/2007   Sinusitis, chronic 12/24/2007   Recurrent acute maxillary and frontal sinusitis     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    Family History  Problem Relation Age of Onset   Multiple sclerosis Father    Breast cancer Mother    Breast cancer Sister    Alcoholism Child        now sober   Past Surgical History:  Procedure Laterality Date   CARPAL TUNNEL RELEASE Left    extensive sinus surgery with ablation of the frontal sinuses     HERNIA REPAIR     MOUTH SURGERY  11/2019   nasal polypectomies     TOOTH EXTRACTION     Social History   Social History Narrative   Right handed    Lives alone    Immunization History  Administered Date(s) Administered   Influenza Split 07/04/2009, 08/02/2011, 07/08/2012, 09/07/2012, 07/06/2013, 07/14/2015, 07/08/2016, 07/04/2020   Influenza, High Dose Seasonal PF 06/19/2017, 06/13/2018, 06/18/2019, 08/24/2019   Influenza,inj,Quad PF,6+ Mos 07/15/2014, 06/19/2017   Influenza-Unspecified 07/25/2016, 06/22/2022   PFIZER(Purple Top)SARS-COV-2 Vaccination 11/20/2019, 12/15/2019, 06/17/2020   Pneumococcal Conjugate-13 05/26/2018   Pneumococcal  Polysaccharide-23 05/27/2006, 07/15/2014   Td 03/06/2007   Tdap 08/23/2012   Zoster Recombinat (Shingrix) 05/23/2018, 08/21/2018   Zoster, Live 04/24/2016, 05/20/2018, 07/30/2018     Objective: Vital Signs: BP 133/88 (BP Location: Left Arm, Patient Position: Sitting, Cuff Size: Normal)   Pulse 60   Resp 17   Ht 5\' 7"  (1.702 m)   Wt 165 lb 3.2 oz (74.9 kg)   BMI 25.87 kg/m    Physical Exam Vitals and nursing note reviewed.  Constitutional:      Appearance: He is well-developed.  HENT:     Head: Normocephalic and atraumatic.  Eyes:     Conjunctiva/sclera: Conjunctivae normal.     Pupils: Pupils are equal, round, and reactive to light.  Cardiovascular:     Rate and Rhythm: Normal rate and regular rhythm.     Heart sounds: Normal heart sounds.  Pulmonary:     Effort: Pulmonary effort is normal.     Breath sounds: Normal breath sounds.  Abdominal:     General: Bowel sounds are normal.     Palpations: Abdomen is soft.  Musculoskeletal:     Cervical back: Normal range of motion and neck supple.  Skin:    General: Skin is warm and dry.     Capillary Refill: Capillary refill takes less than 2 seconds.  Neurological:  Mental Status: He is alert and oriented to person, place, and time.  Psychiatric:        Behavior: Behavior normal.      Musculoskeletal Exam: He had good range of motion of the cervical spine.  Shoulder joints, elbow joints, wrist joints were in good range of motion.  He had bilateral MCP thickening and ulnar deviation.  He had bilateral PIP and DIP thickening.  Hip joints and knee joints in good range of motion.  He had no tenderness over ankles or MTPs.  CDAI Exam: CDAI Score: -- Patient Global: 0 mm; Provider Global: 0 mm Swollen: --; Tender: -- Joint Exam 03/08/2023   No joint exam has been documented for this visit   There is currently no information documented on the homunculus. Go to the Rheumatology activity and complete the homunculus joint  exam.  Investigation: No additional findings.  Imaging: No results found.  Recent Labs: Lab Results  Component Value Date   WBC 4.4 11/21/2022   HGB 13.4 11/21/2022   PLT 287 11/21/2022   NA 138 11/21/2022   K 3.7 11/21/2022   CL 104 11/21/2022   CO2 26 11/21/2022   GLUCOSE 79 11/21/2022   BUN 14 11/21/2022   CREATININE 1.06 11/21/2022   BILITOT 0.9 11/21/2022   ALKPHOS 55 11/21/2022   AST 24 11/21/2022   ALT 18 11/21/2022   PROT 7.2 11/21/2022   ALBUMIN 3.8 11/21/2022   CALCIUM 9.5 11/21/2022   GFRAA >60 05/23/2020   QFTBGOLD NEGATIVE 07/16/2017   QFTBGOLDPLUS Negative 09/26/2022    Speciality Comments: Orencia 03/19, Humira 2017-2019, Simponi10/14-06/16, MTX 05/18-03/19  Procedures:  No procedures performed Allergies: Simponi [golimumab], Aminophylline, Aspirin, Other, and Theophyllines   Assessment / Plan:     Visit Diagnoses: Rheumatoid arthritis of multiple sites without rheumatoid factor (HCC) - Negative RF and CCP.  Patient has been doing extremely well on Orencia infusions every 8 weeks.  Although he is concerned as he has been having recurrent infections.  He states he has several courses of antibiotics for bronchitis, sinusitis and ear infection.  He states he had a detailed discussion with his PCP who wants him to come off Orencia.  His last Orencia infusion was on November 21, 2022.  He would like to switch to and on biologic agent.  He had tried methotrexate in the past which was not very effective.  After a detailed discussion we reviewed indications side effects contraindications of leflunomide.  Patient wants to proceed with leflunomide.  Handout was given and consent was taken.  I will try him on low-dose leflunomide starting at 10 mg p.o. daily.  Will check labs in a month after starting leflunomide and then every 3 months.  Dose can be increased in the future.  Side effects of leflunomide were discussed at length. Medication counseling:   Baseline  Immunosuppressant Therapy Labs   Patient was counseled on the purpose, proper use, and adverse effects of leflunomide including risk of infection, nausea/diarrhea/weight loss, increase in blood pressure, rash, hair loss, tingling in the hands and feet, and signs and symptoms of interstitial lung disease.   Also counseled on Black Box warning of liver injury and importance of avoiding alcohol while on therapy. Discussed that there is the possibility of an increased risk of malignancy but it is not well understood if this increased risk is due to the medication or the disease state.  Counseled patient to avoid live vaccines. Recommend annual influenza, Pneumovax 23, Prevnar 13, and Shingrix as  indicated.   Discussed the importance of frequent monitoring of liver function and blood count.  Standing orders placed.    Provided patient with educational materials on leflunomide and answered all questions.  Patient consented to [place] use, and consent will be uploaded into the media tab.     High risk medication use -(Orencia 750 mg IV infusions every 8 weeks.-Discontinued February 2024 due to frequent infections) previous treatment Humira, Simponi and methotrexate- Plan: CBC with Differential/Platelet, COMPLETE METABOLIC PANEL WITH GFR today, in 1 month and then every 3 months.  Information on immunization was placed in the AVS.  He was advised to hold Orencia if he develops an infection and resume after the infection resolves.  Primary osteoarthritis of both hands-he has rheumatoid arthritis and osteoarthritis overlap.  His rheumatoid arthritis has been in remission on Orencia infusions.  His osteoarthritis is not very bothersome.  He had bilateral PIP and DIP thickening.  Chronic midline low back pain without sciatica-he has intermittent lower back pain.  He denies any discomfort today.  Other medical problems are listed as follows:  History of IBS  History of gastroesophageal reflux (GERD)  Coarse  tremors  History of asthma  History of depression  Family history of MS (multiple sclerosis)  Memory loss - followed by neurology.  Orders: Orders Placed This Encounter  Procedures   CBC with Differential/Platelet   COMPLETE METABOLIC PANEL WITH GFR   Meds ordered this encounter  Medications   leflunomide (ARAVA) 10 MG tablet    Sig: Take 1 tablet (10 mg total) by mouth daily.    Dispense:  30 tablet    Refill:  2     Follow-Up Instructions: Return in about 2 months (around 05/08/2023) for Rheumatoid arthritis.   Pollyann Savoy, MD  Note - This record has been created using Animal nutritionist.  Chart creation errors have been sought, but may not always  have been located. Such creation errors do not reflect on  the standard of medical care.

## 2023-02-27 ENCOUNTER — Telehealth: Payer: Self-pay | Admitting: Internal Medicine

## 2023-02-27 ENCOUNTER — Other Ambulatory Visit: Payer: Self-pay

## 2023-02-27 MED ORDER — ALBUTEROL SULFATE HFA 108 (90 BASE) MCG/ACT IN AERS: INHALATION_SPRAY | RESPIRATORY_TRACT | 12 refills | Status: DC

## 2023-02-27 NOTE — Telephone Encounter (Signed)
Patient called to request a new script for his Ventilin medication.  He stated the pharmacy asked him to call his doctor to get the script.  Please advise and call patient once the order has been placed.  CB# [phone]

## 2023-02-27 NOTE — Telephone Encounter (Signed)
Spoke with patient. Advised albuterol inhaler has been sent to pharmacy. NFN

## 2023-02-27 NOTE — Telephone Encounter (Signed)
Albuterol has been sent. NFN

## 2023-03-08 ENCOUNTER — Encounter: Payer: Self-pay | Admitting: *Deleted

## 2023-03-08 ENCOUNTER — Encounter: Payer: Self-pay | Admitting: Rheumatology

## 2023-03-08 ENCOUNTER — Ambulatory Visit: Payer: Medicare Other | Attending: Rheumatology | Admitting: Rheumatology

## 2023-03-08 VITALS — BP 133/88 | HR 60 | Resp 17 | Ht 67.0 in | Wt 165.2 lb

## 2023-03-08 DIAGNOSIS — Z79899 Other long term (current) drug therapy: Secondary | ICD-10-CM

## 2023-03-08 DIAGNOSIS — Z8719 Personal history of other diseases of the digestive system: Secondary | ICD-10-CM | POA: Diagnosis not present

## 2023-03-08 DIAGNOSIS — M19042 Primary osteoarthritis, left hand: Secondary | ICD-10-CM | POA: Diagnosis not present

## 2023-03-08 DIAGNOSIS — R413 Other amnesia: Secondary | ICD-10-CM

## 2023-03-08 DIAGNOSIS — M0609 Rheumatoid arthritis without rheumatoid factor, multiple sites: Secondary | ICD-10-CM

## 2023-03-08 DIAGNOSIS — G8929 Other chronic pain: Secondary | ICD-10-CM

## 2023-03-08 DIAGNOSIS — Z820 Family history of epilepsy and other diseases of the nervous system: Secondary | ICD-10-CM

## 2023-03-08 DIAGNOSIS — G252 Other specified forms of tremor: Secondary | ICD-10-CM | POA: Diagnosis not present

## 2023-03-08 DIAGNOSIS — Z8709 Personal history of other diseases of the respiratory system: Secondary | ICD-10-CM

## 2023-03-08 DIAGNOSIS — M545 Low back pain, unspecified: Secondary | ICD-10-CM | POA: Diagnosis not present

## 2023-03-08 DIAGNOSIS — M19041 Primary osteoarthritis, right hand: Secondary | ICD-10-CM

## 2023-03-08 DIAGNOSIS — Z8659 Personal history of other mental and behavioral disorders: Secondary | ICD-10-CM

## 2023-03-08 MED ORDER — LEFLUNOMIDE 10 MG PO TABS: 10.0000 mg | ORAL_TABLET | Freq: Every day | ORAL | 2 refills | Status: DC

## 2023-03-08 NOTE — Patient Instructions (Addendum)
Leflunomide Tablets What is this medication? LEFLUNOMIDE (le FLOO na mide) treats the symptoms of rheumatoid arthritis. It works by slowing down an overactive immune system. This decreases inflammation. It belongs to a group of medications called DMARDs. This medicine may be used for other purposes; ask your health care provider or pharmacist if you have questions. COMMON BRAND NAME(S): Arava What should I tell my care team before I take this medication? They need to know if you have any of these conditions: Cancer Diabetes High blood pressure Immune system problems Infection Kidney disease Liver disease Low blood cell levels (white cells, red cells, and platelets) Lung or breathing disease, such as asthma or COPD Recent or upcoming vaccine Skin conditions Tingling of the fingers or toes, or other nerve disorder An unusual or allergic reaction to leflunomide, other medications, food, dyes, or preservatives Pregnant or trying to get pregnant Breastfeeding How should I use this medication? Take this medication by mouth with a full glass of water. Take it as directed on the prescription label at the same time every day. Keep taking it unless your care team tells you to stop. Talk to your care team about the use of this medication in children. Special care may be needed. Overdosage: If you think you have taken too much of this medicine contact a poison control center or emergency room at once. NOTE: This medicine is only for you. Do not share this medicine with others. What if I miss a dose? If you miss a dose, take it as soon as you can. If it is almost time for your next dose, take only that dose. Do not take double or extra doses. What may interact with this medication? Do not take this medication with any of the following: Teriflunomide This medication may also interact with the following: Alosetron Caffeine Cefaclor Certain medications for diabetes, such as nateglinide,  repaglinide, rosiglitazone, pioglitazone Certain medications for high cholesterol, such as atorvastatin, pravastatin, rosuvastatin, simvastatin Charcoal Cholestyramine Ciprofloxacin Duloxetine Estrogen and progestin hormones Furosemide Ketoprofen Live virus vaccines Medications that increase your risk for infection Methotrexate Mitoxantrone Paclitaxel Penicillin Theophylline Tizanidine Warfarin This list may not describe all possible interactions. Give your health care provider a list of all the medicines, herbs, non-prescription drugs, or dietary supplements you use. Also tell them if you smoke, drink alcohol, or use illegal drugs. Some items may interact with your medicine. What should I watch for while using this medication? Visit your care team for regular checks on your progress. Tell your care team if your symptoms do not start to get better or if they get worse. You may need blood work done while you are taking this medication. This medication may cause serious skin reactions. They can happen weeks to months after starting the medication. Contact your care team right away if you notice fevers or flu-like symptoms with a rash. The rash may be red or purple and then turn into blisters or peeling of the skin. You may also notice a red rash with swelling of the face, lips, or lymph nodes in your neck or under your arms. You should not receive certain vaccines during your treatment and for a certain time after your treatment with this medication ends. Talk to your care team for more information. This medication may stay in your body for up to 2 years after your last dose. Tell your care team about any unusual side effects or symptoms. A medication can be given to help lower your blood levels of this  medication more quickly. Talk to your care team if you may be pregnant. This medication can cause serious birth defects if taken during pregnancy and for a while after the last dose. You will  need a negative pregnancy test before starting this medication. Contraception is recommended while taking this medication and for a while after the last dose. Your care team can help you find the option that works for you. Do not breastfeed while taking this medication. What side effects may I notice from receiving this medication? Side effects that you should report to your care team as soon as possible: Allergic reactions--skin rash, itching, hives, swelling of the face, lips, tongue, or throat Dry cough, shortness of breath or trouble breathing Increase in blood pressure Infection--fever, chills, cough, sore throat, wounds that don't heal, pain or trouble when passing urine, general feeling of discomfort or being unwell Redness, blistering, peeling, or loosening of the skin, including inside the mouth Liver injury--right upper belly pain, loss of appetite, nausea, light-colored stool, dark yellow or brown urine, yellowing skin or eyes, unusual weakness or fatigue Pain, tingling, or numbness in the hands or feet Unusual bruising or bleeding Side effects that usually do not require medical attention (report to your care team if they continue or are bothersome): Back pain Diarrhea Hair loss Headache Nausea This list may not describe all possible side effects. Call your doctor for medical advice about side effects. You may report side effects to FDA at 1-800-FDA-1088. Where should I keep my medication? Keep out of the reach of children and pets. Store at room temperature between 20 and 25 degrees C (68 and 77 degrees F). Protect from moisture and light. Keep the container tightly closed. Get rid of any unused medication after the expiration date. To get rid of medications that are no longer needed or have expired: Take the medication to a medication take-back program. Check with your pharmacy or law enforcement to find a location. If you cannot return the medication, ask your pharmacist or  care team how to get rid of this medication safely. NOTE: This sheet is a summary. It may not cover all possible information. If you have questions about this medicine, talk to your doctor, pharmacist, or health care provider.  2024 Elsevier/Gold Standard (2022-02-22 00:00:00)  Vaccines You are taking a medication(s) that can suppress your immune system.  The following immunizations are recommended: Flu annually Covid-19  Td/Tdap (tetanus, diphtheria, pertussis) every 10 years Pneumonia (Prevnar 15 then Pneumovax 23 at least 1 year apart.  Alternatively, can take Prevnar 20 without needing additional dose) Shingrix: 2 doses from 4 weeks to 6 months apart  Please check with your PCP to make sure you are up to date.   If you have signs or symptoms of an infection or start antibiotics: First, call your PCP for workup of your infection. Hold your medication through the infection, until you complete your antibiotics, and until symptoms resolve if you take the following: Injectable medication (Actemra, Benlysta, Cimzia, Cosentyx, Enbrel, Humira, Kevzara, Orencia, Remicade, Simponi, Stelara, Taltz, Tremfya) Methotrexate Leflunomide (Arava) Mycophenolate (Cellcept) [name] [name], Olumiant, or Rinvoq    Standing Labs We placed an order today for your standing lab work.   Please have your standing labs drawn in 1 month and then every 3 months  Please have your labs drawn 2 weeks prior to your appointment so that the provider can discuss your lab results at your appointment, if possible.  Please note that you may see your imaging and  lab results in MyChart before we have reviewed them. We will contact you once all results are reviewed. Please allow our office up to 72 hours to thoroughly review all of the results before contacting the office for clarification of your results.  WALK-IN LAB HOURS  Monday through Thursday from 8:00 am -12:30 pm and 1:00 pm-5:00 pm and Friday from 8:00 am-12:00 pm.   Patients with office visits requiring labs will be seen before walk-in labs.  You may encounter longer than normal wait times. Please allow additional time. Wait times may be shorter on  Monday and Thursday afternoons.  We do not book appointments for walk-in labs. We appreciate your patience and understanding with our staff.   Labs are drawn by [facility]. Please bring your co-pay at the time of your lab draw.  You may receive a bill from [facility] for your lab work.  Please note if you are on Hydroxychloroquine and and an order has been placed for a Hydroxychloroquine level,  you will need to have it drawn 4 hours or more after your last dose.  If you wish to have your labs drawn at another location, please call the office 24 hours in advance so we can fax the orders.  The office is located at [address], [address], [place], [place] [address]   If you have any questions regarding directions or hours of operation,  please call [phone].   As a reminder, please drink plenty of water prior to coming for your lab work. Thanks!

## 2023-03-09 LAB — COMPLETE METABOLIC PANEL WITH GFR
AG Ratio: 1.6 (calc) (ref 1.0–2.5)
ALT: 18 U/L (ref 9–46)
AST: 19 U/L (ref 10–35)
Albumin: 4.5 g/dL (ref 3.6–5.1)
Alkaline phosphatase (APISO): 62 U/L (ref 35–144)
BUN: 16 mg/dL (ref 7–25)
CO2: 27 mmol/L (ref 20–32)
Calcium: 9.7 mg/dL (ref 8.6–10.3)
Chloride: 104 mmol/L (ref 98–110)
Creat: 1 mg/dL (ref 0.70–1.28)
Globulin: 2.8 g/dL (calc) (ref 1.9–3.7)
Glucose, Bld: 99 mg/dL (ref 65–99)
Potassium: 4.7 mmol/L (ref 3.5–5.3)
Sodium: 140 mmol/L (ref 135–146)
Total Bilirubin: 0.5 mg/dL (ref 0.2–1.2)
Total Protein: 7.3 g/dL (ref 6.1–8.1)
eGFR: 81 mL/min/{1.73_m2} (ref >=60)

## 2023-03-09 LAB — CBC WITH DIFFERENTIAL/PLATELET
Absolute Monocytes: 700 cells/uL (ref 200–950)
Basophils Absolute: 59 cells/uL (ref 0–200)
Basophils Relative: 0.9 %
Eosinophils Absolute: 178 cells/uL (ref 15–500)
Eosinophils Relative: 2.7 %
HCT: 42.2 % (ref 38.5–50.0)
Hemoglobin: 14.5 g/dL (ref 13.2–17.1)
Lymphs Abs: 2303 cells/uL (ref 850–3900)
MCH: 31.5 pg (ref 27.0–33.0)
MCHC: 34.4 g/dL (ref 32.0–36.0)
MCV: 91.7 fL (ref 80.0–100.0)
MPV: 11.1 fL (ref 7.5–12.5)
Monocytes Relative: 10.6 %
Neutro Abs: 3359 cells/uL (ref 1500–7800)
Neutrophils Relative %: 50.9 %
Platelets: 318 10*3/uL (ref 140–400)
RBC: 4.6 10*6/uL (ref 4.20–5.80)
RDW: 12.3 % (ref 11.0–15.0)
Total Lymphocyte: 34.9 %
WBC: 6.6 10*3/uL (ref 3.8–10.8)

## 2023-03-10 NOTE — Progress Notes (Signed)
CBC and CMP are normal.

## 2023-03-26 DIAGNOSIS — L820 Inflamed seborrheic keratosis: Secondary | ICD-10-CM | POA: Diagnosis not present

## 2023-03-26 DIAGNOSIS — L538 Other specified erythematous conditions: Secondary | ICD-10-CM | POA: Diagnosis not present

## 2023-03-26 DIAGNOSIS — L298 Other pruritus: Secondary | ICD-10-CM | POA: Diagnosis not present

## 2023-03-26 DIAGNOSIS — D1801 Hemangioma of skin and subcutaneous tissue: Secondary | ICD-10-CM | POA: Diagnosis not present

## 2023-03-26 DIAGNOSIS — L309 Dermatitis, unspecified: Secondary | ICD-10-CM | POA: Diagnosis not present

## 2023-04-04 ENCOUNTER — Other Ambulatory Visit: Payer: Self-pay

## 2023-04-04 DIAGNOSIS — Z79899 Other long term (current) drug therapy: Secondary | ICD-10-CM | POA: Diagnosis not present

## 2023-04-04 LAB — CBC WITH DIFFERENTIAL/PLATELET
Absolute Monocytes: 717 cells/uL (ref 200–950)
Eosinophils Absolute: 151 cells/uL (ref 15–500)
HCT: 40.6 % (ref 38.5–50.0)
MCH: 30.9 pg (ref 27.0–33.0)
MPV: 11.5 fL (ref 7.5–12.5)
Monocytes Relative: 12.8 %
RDW: 12.4 % (ref 11.0–15.0)

## 2023-04-05 LAB — COMPLETE METABOLIC PANEL WITH GFR
AG Ratio: 1.7 (calc) (ref 1.0–2.5)
ALT: 18 U/L (ref 9–46)
AST: 17 U/L (ref 10–35)
Albumin: 4.2 g/dL (ref 3.6–5.1)
Alkaline phosphatase (APISO): 57 U/L (ref 35–144)
BUN: 15 mg/dL (ref 7–25)
CO2: 24 mmol/L (ref 20–32)
Calcium: 9.6 mg/dL (ref 8.6–10.3)
Chloride: 105 mmol/L (ref 98–110)
Creat: 1.05 mg/dL (ref 0.70–1.28)
Globulin: 2.5 g/dL (calc) (ref 1.9–3.7)
Glucose, Bld: 93 mg/dL (ref 65–99)
Potassium: 4.4 mmol/L (ref 3.5–5.3)
Sodium: 141 mmol/L (ref 135–146)
Total Bilirubin: 0.4 mg/dL (ref 0.2–1.2)
Total Protein: 6.7 g/dL (ref 6.1–8.1)
eGFR: 76 mL/min/{1.73_m2} (ref >=60)

## 2023-04-05 LAB — CBC WITH DIFFERENTIAL/PLATELET
Basophils Absolute: 50 cells/uL (ref 0–200)
Basophils Relative: 0.9 %
Eosinophils Relative: 2.7 %
Hemoglobin: 13.8 g/dL (ref 13.2–17.1)
Lymphs Abs: 2106 cells/uL (ref 850–3900)
MCHC: 34 g/dL (ref 32.0–36.0)
MCV: 91 fL (ref 80.0–100.0)
Neutro Abs: 2576 cells/uL (ref 1500–7800)
Neutrophils Relative %: 46 %
Platelets: 267 10*3/uL (ref 140–400)
RBC: 4.46 10*6/uL (ref 4.20–5.80)
Total Lymphocyte: 37.6 %
WBC: 5.6 10*3/uL (ref 3.8–10.8)

## 2023-04-05 NOTE — Progress Notes (Signed)
CBC and CMP are normal.

## 2023-04-07 ENCOUNTER — Other Ambulatory Visit: Payer: Self-pay | Admitting: Rheumatology

## 2023-04-08 DIAGNOSIS — J309 Allergic rhinitis, unspecified: Secondary | ICD-10-CM | POA: Diagnosis not present

## 2023-04-08 DIAGNOSIS — J321 Chronic frontal sinusitis: Secondary | ICD-10-CM | POA: Diagnosis not present

## 2023-04-08 DIAGNOSIS — Z9889 Other specified postprocedural states: Secondary | ICD-10-CM | POA: Diagnosis not present

## 2023-04-08 DIAGNOSIS — H1010 Acute atopic conjunctivitis, unspecified eye: Secondary | ICD-10-CM | POA: Diagnosis not present

## 2023-04-30 NOTE — Progress Notes (Signed)
Office Visit Note  Patient: [name] [name]             Date of Birth: 03-22-1951           MRN: [id number]             PCP: Pollyann Savoy, MD Referring: Pollyann Savoy, MD Visit Date: 05/14/2023 Occupation: @GUAROCC @  Subjective:  Medication management  History of Present Illness: [name] [name] is a 72 y.o. male with seronegative rheumatoid arthritis and osteoarthritis.  He was switched from Orencia to leflunomide in May 2024.  Patient states that he has not experienced any side effects from leflunomide.  He has been tolerating it well without any interruption.  He denies any joint pain or joint swelling.    Activities of Daily Living:  Patient reports morning stiffness for 0 minutes.   Patient Denies nocturnal pain.  Difficulty dressing/grooming: Denies Difficulty climbing stairs: Denies Difficulty getting out of chair: Denies Difficulty using hands for taps, buttons, cutlery, and/or writing: Reports  Review of Systems  Constitutional:  Negative for fatigue.  HENT:  Positive for mouth dryness. Negative for mouth sores.   Eyes:  Negative for dryness.  Respiratory:  Negative for shortness of breath.   Cardiovascular:  Negative for chest pain and palpitations.  Gastrointestinal:  Negative for blood in stool, constipation and diarrhea.  Endocrine: Positive for increased urination.  Genitourinary:  Negative for painful urination and involuntary urination.  Musculoskeletal:  Negative for joint pain, gait problem, joint pain, joint swelling, myalgias, muscle weakness, morning stiffness, muscle tenderness and myalgias.  Skin:  Positive for rash and sensitivity to sunlight. Negative for color change and hair loss.  Allergic/Immunologic: Positive for susceptible to infections.  Neurological:  Positive for parasthesias. Negative for dizziness and headaches.  Hematological:  Negative for swollen glands.  Psychiatric/Behavioral:  Negative for depressed mood and sleep  disturbance. The patient is not nervous/anxious.     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   Asthmatic bronchitis, mild intermittent, uncomplicated 02/12/2017   Herpes zoster 08/01/2013   Rheumatoid arthritis of multiple sites without rheumatoid factor (HCC) 09/18/2010   Polyp of nasal cavity 12/24/2007   Sinusitis, chronic 12/24/2007   Seasonal and perennial allergic rhinitis 12/24/2007   Allergic-infective asthma 12/24/2007   GERD (gastroesophageal reflux disease) 12/24/2007    Past Medical History:  Diagnosis Date   Allergic-infective asthma 12/24/2007   FENO- 08/07/16- 25 Office spirometry 08/07/16- WNL, FEV1 3.59/ 110%, R 0.81    Asthmatic bronchitis, mild intermittent, uncomplicated 02/12/2017   PFT 11/01/16- WNL-FVC 5.30/123%, FEV1 4.0/124%, ratio 0.75, FEF 25-75% 3.49/135%, TLC 131%, DLCO 99%  Last Assessment & Plan:  Doing well with Trelegy. Suggested protective mask if unavoidable dust.   Bipolar 1 disorder    Coarse tremors 12/29/2019   Upper extremities, bilateral   Diplopia 12/29/2019   Disorder of anterior pituitary 10/31/2021   Esotropia, intermittent 12/29/2019   Generalized anxiety disorder    GERD (gastroesophageal reflux  disease) 12/24/2007   Herpes zoster 08/01/2013   October, 2014; right lateral chest   History of COVID-19 10/05/2021   History of IBS 05/29/2018   Major depressive disorder in remission 07/15/2017    Mild neurocognitive disorder 09/14/2020   Polyp of nasal cavity 12/24/2007   Regular astigmatism of both eyes 12/29/2019   Rheumatoid arthritis of multiple sites without rheumatoid factor 09/18/2010   Seasonal and perennial allergic rhinitis 12/24/2007   Sinusitis, chronic 12/24/2007   Recurrent acute maxillary and frontal sinusitis     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    Family History  Problem Relation Age of Onset   Multiple sclerosis Father    Breast cancer Mother    Breast cancer Sister    Alcoholism Child        now sober   Past Surgical History:  Procedure Laterality Date   CARPAL TUNNEL RELEASE Left    extensive sinus surgery with ablation of the frontal sinuses     HERNIA REPAIR     MOUTH SURGERY  11/2019   nasal polypectomies     TOOTH EXTRACTION     Social History   Social History Narrative   Right handed    Lives alone    Immunization History  Administered Date(s) Administered   Influenza Split 07/04/2009, 08/02/2011, 07/08/2012, 09/07/2012, 07/06/2013, 07/14/2015, 07/08/2016, 07/04/2020   Influenza, High Dose Seasonal PF 06/19/2017, 06/13/2018, 06/18/2019, 08/24/2019   Influenza,inj,Quad PF,6+ Mos 07/15/2014, 06/19/2017   Influenza-Unspecified 07/25/2016, 06/22/2022   PFIZER(Purple Top)SARS-COV-2 Vaccination 11/20/2019, 12/15/2019, 06/17/2020   Pneumococcal Conjugate-13 05/26/2018   Pneumococcal Polysaccharide-23 05/27/2006, 07/15/2014   Td 03/06/2007   Tdap 08/23/2012   Zoster Recombinant(Shingrix) 05/23/2018, 08/21/2018   Zoster, Live 04/24/2016, 05/20/2018, 07/30/2018     Objective: Vital Signs: BP 130/84 (BP Location: Left Arm, Patient Position: Sitting, Cuff Size: Normal)   Pulse 67   Resp 15   Ht 5\' 7"  (1.702 m)   Wt 164 lb (74.4 kg)   BMI 25.69 kg/m    Physical Exam Vitals and nursing note reviewed.  Constitutional:      Appearance: He is well-developed.  HENT:     Head: Normocephalic and atraumatic.  Eyes:      Conjunctiva/sclera: Conjunctivae normal.     Pupils: Pupils are equal, round, and reactive to light.  Cardiovascular:     Rate and Rhythm: Normal rate and regular rhythm.     Heart sounds: Normal heart sounds.  Pulmonary:     Effort: Pulmonary effort is normal.     Breath sounds: Normal breath sounds.  Abdominal:     General: Bowel sounds are normal.     Palpations: Abdomen is soft.  Musculoskeletal:     Cervical back: Normal range of motion and neck supple.  Skin:    General: Skin is warm and dry.     Capillary Refill: Capillary refill takes less than 2 seconds.  Neurological:     Mental Status: He is alert and oriented to person, place, and time.  Psychiatric:        Behavior: Behavior normal.      Musculoskeletal Exam: Cervical, thoracic and lumbar spine were in good range of motion.  Shoulder joints and elbow joints were in good range of motion.  He had limited range of motion of the wrist joints with some synovial thickening but no synovitis.  Bilateral MCP thickening with no synovitis was noted.  Ulnar deviation was noted.  PIP and DIP thickening was noted.  Hip joints and knee joints  in good range of motion.  He had no tenderness over ankles or MTPs.  CDAI Exam: CDAI Score: -- Patient Global: 0 / 100; Provider Global: 0 / 100 Swollen: --; Tender: -- Joint Exam 05/14/2023   No joint exam has been documented for this visit   There is currently no information documented on the homunculus. Go to the Rheumatology activity and complete the homunculus joint exam.  Investigation: No additional findings.  Imaging: No results found.  Recent Labs: Lab Results  Component Value Date   WBC 5.6 04/04/2023   HGB 13.8 04/04/2023   PLT 267 04/04/2023   NA 141 04/04/2023   K 4.4 04/04/2023   CL 105 04/04/2023   CO2 24 04/04/2023   GLUCOSE 93 04/04/2023   BUN 15 04/04/2023   CREATININE 1.05 04/04/2023   BILITOT 0.4 04/04/2023   ALKPHOS 55 11/21/2022   AST 17 04/04/2023    ALT 18 04/04/2023   PROT 6.7 04/04/2023   ALBUMIN 3.8 11/21/2022   CALCIUM 9.6 04/04/2023   GFRAA >60 05/23/2020   QFTBGOLD NEGATIVE 07/16/2017   QFTBGOLDPLUS Negative 09/26/2022    Speciality Comments: Orencia 03/19, Humira 2017-2019, Simponi10/14-06/16, MTX 05/18-03/19  Procedures:  No procedures performed Allergies: Simponi [golimumab], Aminophylline, Aspirin, Other, and Theophyllines   Assessment / Plan:     Visit Diagnoses: Rheumatoid arthritis of multiple sites without rheumatoid factor (HCC) - Negative RF and CCP.  Patient has been taking leflunomide 10 mg p.o. daily since the last visit.  He has been tolerating medication well.  He states his rheumatoid arthritis very well-controlled on leflunomide.  He had no interruption in the therapy.  He denies any recent infections.  High risk medication use - Arava 10mg  po qd. (Orencia 750 mg IV infusions every 8 weeks.-D/c February 2024 due to frequent infections) previous treatment Humira, Simponi, methotrexate.  Labs obtained on April 04, 2023 CBC and CMP were normal.  He will get labs in September and every 3 months to monitor for drug toxicity.  Information about immunization was placed in the AVS.  He was advised to stop leflunomide if he develops an infection resume after the infection resolves.  Primary osteoarthritis of both hands -he has ulnar deviation, MCP thickening and wrist joint thickening but no synovitis.  DIP and PIP thickening was also noted.  He has rheumatoid arthritis and osteoarthritis overlap.  Chronic midline low back pain without sciatica-he has chronic discomfort in his back.  Recently the back pain has been better.  Other medical problems are listed as follows:  History of gastroesophageal reflux (GERD)  History of IBS  History of depression  Coarse tremors  History of asthma  Family history of MS (multiple sclerosis)  Memory loss - followed by neurology.  Orders: No orders of the defined types were  placed in this encounter.  No orders of the defined types were placed in this encounter.    Follow-Up Instructions: Return in about 5 months (around 10/14/2023) for Rheumatoid arthritis.   Pollyann Savoy, MD  Note - This record has been created using Animal nutritionist.  Chart creation errors have been sought, but may not always  have been located. Such creation errors do not reflect on  the standard of medical care.

## 2023-05-14 ENCOUNTER — Ambulatory Visit: Payer: Medicare Other | Attending: Rheumatology | Admitting: Rheumatology

## 2023-05-14 ENCOUNTER — Encounter: Payer: Self-pay | Admitting: Rheumatology

## 2023-05-14 VITALS — BP 130/84 | HR 67 | Resp 15 | Ht 67.0 in | Wt 164.0 lb

## 2023-05-14 DIAGNOSIS — Z8719 Personal history of other diseases of the digestive system: Secondary | ICD-10-CM

## 2023-05-14 DIAGNOSIS — G8929 Other chronic pain: Secondary | ICD-10-CM

## 2023-05-14 DIAGNOSIS — Z820 Family history of epilepsy and other diseases of the nervous system: Secondary | ICD-10-CM

## 2023-05-14 DIAGNOSIS — G252 Other specified forms of tremor: Secondary | ICD-10-CM

## 2023-05-14 DIAGNOSIS — M545 Low back pain, unspecified: Secondary | ICD-10-CM

## 2023-05-14 DIAGNOSIS — Z79899 Other long term (current) drug therapy: Secondary | ICD-10-CM | POA: Diagnosis not present

## 2023-05-14 DIAGNOSIS — Z8709 Personal history of other diseases of the respiratory system: Secondary | ICD-10-CM

## 2023-05-14 DIAGNOSIS — R413 Other amnesia: Secondary | ICD-10-CM | POA: Diagnosis not present

## 2023-05-14 DIAGNOSIS — M19041 Primary osteoarthritis, right hand: Secondary | ICD-10-CM | POA: Diagnosis not present

## 2023-05-14 DIAGNOSIS — M0609 Rheumatoid arthritis without rheumatoid factor, multiple sites: Secondary | ICD-10-CM | POA: Diagnosis not present

## 2023-05-14 DIAGNOSIS — M19042 Primary osteoarthritis, left hand: Secondary | ICD-10-CM

## 2023-05-14 DIAGNOSIS — Z8659 Personal history of other mental and behavioral disorders: Secondary | ICD-10-CM

## 2023-05-14 NOTE — Patient Instructions (Signed)
Standing Labs We placed an order today for your standing lab work.   Please have your standing labs drawn in September and every 3 months  Please have your labs drawn 2 weeks prior to your appointment so that the provider can discuss your lab results at your appointment, if possible.  Please note that you may see your imaging and lab results in MyChart before we have reviewed them. We will contact you once all results are reviewed. Please allow our office up to 72 hours to thoroughly review all of the results before contacting the office for clarification of your results.  WALK-IN LAB HOURS  Monday through Thursday from 8:00 am -12:30 pm and 1:00 pm-5:00 pm and Friday from 8:00 am-12:00 pm.  Patients with office visits requiring labs will be seen before walk-in labs.  You may encounter longer than normal wait times. Please allow additional time. Wait times may be shorter on  Monday and Thursday afternoons.  We do not book appointments for walk-in labs. We appreciate your patience and understanding with our staff.   Labs are drawn by [facility]. Please bring your co-pay at the time of your lab draw.  You may receive a bill from [facility] for your lab work.  Please note if you are on Hydroxychloroquine and and an order has been placed for a Hydroxychloroquine level,  you will need to have it drawn 4 hours or more after your last dose.  If you wish to have your labs drawn at another location, please call the office 24 hours in advance so we can fax the orders.  The office is located at [address], [address], [place], [place] [address]   If you have any questions regarding directions or hours of operation,  please call [phone].   As a reminder, please drink plenty of water prior to coming for your lab work. Thanks!   Vaccines You are taking a medication(s) that can suppress your immune system.  The following immunizations are recommended: Flu annually Covid-19  RSV Td/Tdap (tetanus,  diphtheria, pertussis) every 10 years Pneumonia (Prevnar 15 then Pneumovax 23 at least 1 year apart.  Alternatively, can take Prevnar 20 without needing additional dose) Shingrix: 2 doses from 4 weeks to 6 months apart  Please check with your PCP to make sure you are up to date.   If you have signs or symptoms of an infection or start antibiotics: First, call your PCP for workup of your infection. Hold your medication through the infection, until you complete your antibiotics, and until symptoms resolve if you take the following: Injectable medication (Actemra, Benlysta, Cimzia, Cosentyx, Enbrel, Humira, Kevzara, Orencia, Remicade, Simponi, Stelara, Taltz, Tremfya) Methotrexate Leflunomide (Arava) Mycophenolate (Cellcept) Harriette Ohara, Olumiant, or Rinvoq

## 2023-05-15 ENCOUNTER — Other Ambulatory Visit: Payer: Self-pay | Admitting: Internal Medicine

## 2023-05-30 ENCOUNTER — Telehealth: Payer: Self-pay | Admitting: Internal Medicine

## 2023-05-30 NOTE — Telephone Encounter (Signed)
Patient is experiencing an upper respiratory infection and is coughing up green mucus. He would like a prescription to be put in. Call back number [phone]

## 2023-05-30 NOTE — Telephone Encounter (Signed)
Patient is experiencing an upper respiratory infection and is coughing up green mucus. He would like a prescription to be put in. Call back number [phone]  [facility] in [place]

## 2023-05-31 ENCOUNTER — Telehealth: Payer: Self-pay | Admitting: Internal Medicine

## 2023-05-31 MED ORDER — DOXYCYCLINE HYCLATE 100 MG PO TABS
100.0000 mg | ORAL_TABLET | Freq: Two times a day (BID) | ORAL | 0 refills | Status: DC
Start: 2023-05-31 — End: 2023-06-11

## 2023-05-31 NOTE — Telephone Encounter (Signed)
Called reporting respiratory infection with green sputum. Plan- doxycycline sent

## 2023-05-31 NOTE — Telephone Encounter (Signed)
ATC X1 LVM  Please advise patient Dr. Maple Hudson has sent Doxycycline to his pharmacy

## 2023-06-03 ENCOUNTER — Other Ambulatory Visit: Payer: Self-pay | Admitting: Rheumatology

## 2023-06-03 NOTE — Telephone Encounter (Signed)
Last Fill: 03/08/2023  Labs: 04/04/2023 CBC and CMP are normal.   Next Visit: 10/23/2023   Last Visit: 05/14/2023   DX: Rheumatoid arthritis of multiple sites without rheumatoid factor   Current Dose per office note 05/14/2023: Arava 10mg  po qd.   Okay to refill Arava ?

## 2023-06-06 DIAGNOSIS — D485 Neoplasm of uncertain behavior of skin: Secondary | ICD-10-CM | POA: Diagnosis not present

## 2023-06-06 DIAGNOSIS — L570 Actinic keratosis: Secondary | ICD-10-CM | POA: Diagnosis not present

## 2023-06-07 ENCOUNTER — Other Ambulatory Visit: Payer: Self-pay | Admitting: Internal Medicine

## 2023-06-11 MED ORDER — DOXYCYCLINE HYCLATE 100 MG PO TABS: 100.0000 mg | ORAL_TABLET | Freq: Two times a day (BID) | ORAL | 0 refills | Status: AC

## 2023-06-11 NOTE — Telephone Encounter (Signed)
Spoke with patient and advised. He will contact office if not improving after this round. Rx sent to pharmacy.  Nothing further needed at this time.

## 2023-06-11 NOTE — Telephone Encounter (Signed)
Spoke with patient and he states he has finished the first round of doxy. He states he is still coughing up a thick greenish mucus, he also reports increased asthma attacks about twice per day. He would like to request another round of doxy. He has also been taking Mucinex Sinus Max BID. He denies fever/chills or other symptoms.   Please advise, thank you!

## 2023-06-11 NOTE — Telephone Encounter (Signed)
Ok to refill doxycycline 10 mg, # 14, 1 twice daily. If this doesn't clear him up, I will want to suggest a different antibiotic.

## 2023-06-11 NOTE — Telephone Encounter (Signed)
Pt states he just finished first round for doxycycline and needs a refill for it.  [facility] in [place]

## 2023-06-20 DIAGNOSIS — E785 Hyperlipidemia, unspecified: Secondary | ICD-10-CM | POA: Diagnosis not present

## 2023-06-20 DIAGNOSIS — I10 Essential (primary) hypertension: Secondary | ICD-10-CM | POA: Diagnosis not present

## 2023-06-20 DIAGNOSIS — M069 Rheumatoid arthritis, unspecified: Secondary | ICD-10-CM | POA: Diagnosis not present

## 2023-06-20 DIAGNOSIS — K219 Gastro-esophageal reflux disease without esophagitis: Secondary | ICD-10-CM | POA: Diagnosis not present

## 2023-06-20 DIAGNOSIS — Z0000 Encounter for general adult medical examination without abnormal findings: Secondary | ICD-10-CM | POA: Diagnosis not present

## 2023-06-20 DIAGNOSIS — J309 Allergic rhinitis, unspecified: Secondary | ICD-10-CM | POA: Diagnosis not present

## 2023-06-20 DIAGNOSIS — D84821 Immunodeficiency due to drugs: Secondary | ICD-10-CM | POA: Diagnosis not present

## 2023-07-04 ENCOUNTER — Ambulatory Visit (INDEPENDENT_AMBULATORY_CARE_PROVIDER_SITE_OTHER): Payer: Medicare Other

## 2023-07-04 ENCOUNTER — Ambulatory Visit: Payer: Medicare Other | Admitting: Internal Medicine

## 2023-07-04 ENCOUNTER — Other Ambulatory Visit: Payer: Self-pay | Admitting: *Deleted

## 2023-07-04 ENCOUNTER — Encounter: Payer: Self-pay | Admitting: Internal Medicine

## 2023-07-04 VITALS — BP 112/70 | HR 68 | Temp 98.6°F | Ht 67.0 in | Wt 162.0 lb

## 2023-07-04 DIAGNOSIS — Z87891 Personal history of nicotine dependence: Secondary | ICD-10-CM

## 2023-07-04 DIAGNOSIS — R042 Hemoptysis: Secondary | ICD-10-CM

## 2023-07-04 DIAGNOSIS — J4531 Mild persistent asthma with (acute) exacerbation: Secondary | ICD-10-CM

## 2023-07-04 DIAGNOSIS — J45909 Unspecified asthma, uncomplicated: Secondary | ICD-10-CM | POA: Diagnosis not present

## 2023-07-04 DIAGNOSIS — Z79899 Other long term (current) drug therapy: Secondary | ICD-10-CM | POA: Diagnosis not present

## 2023-07-04 DIAGNOSIS — Z9225 Personal history of immunosupression therapy: Secondary | ICD-10-CM

## 2023-07-04 DIAGNOSIS — Z8739 Personal history of other diseases of the musculoskeletal system and connective tissue: Secondary | ICD-10-CM

## 2023-07-04 LAB — CBC WITH DIFFERENTIAL/PLATELET
Absolute Monocytes: 1052 cells/uL — ABNORMAL HIGH (ref 200–950)
Basophils Absolute: 57 cells/uL (ref 0–200)
Basophils Relative: 0.9 %
Eosinophils Absolute: 139 cells/uL (ref 15–500)
Eosinophils Relative: 2.2 %
HCT: 41 % (ref 38.5–50.0)
Hemoglobin: 13.3 g/dL (ref 13.2–17.1)
Lymphs Abs: 1613 cells/uL (ref 850–3900)
MCH: 29.9 pg (ref 27.0–33.0)
MCHC: 32.4 g/dL (ref 32.0–36.0)
MCV: 92.1 fL (ref 80.0–100.0)
MPV: 11.7 fL (ref 7.5–12.5)
Monocytes Relative: 16.7 %
Neutro Abs: 3440 cells/uL (ref 1500–7800)
Neutrophils Relative %: 54.6 %
Platelets: 246 10*3/uL (ref 140–400)
RBC: 4.45 10*6/uL (ref 4.20–5.80)
RDW: 12.9 % (ref 11.0–15.0)
Total Lymphocyte: 25.6 %
WBC: 6.3 10*3/uL (ref 3.8–10.8)

## 2023-07-04 MED ORDER — AZITHROMYCIN 250 MG PO TABS: ORAL_TABLET | ORAL | 0 refills | Status: DC

## 2023-07-04 MED ORDER — PREDNISONE 10 MG PO TABS: 10.0000 mg | ORAL_TABLET | Freq: Every day | ORAL | 0 refills | Status: DC

## 2023-07-04 NOTE — Patient Instructions (Addendum)
ICD-10-CM   1. Mild persistent asthmatic bronchitis with acute exacerbation  J45.31 DG Chest 2 View    2. Hemoptysis  R04.2     3. History of rheumatoid arthritis  Z87.39     4. Increased infection risk status post immunosuppressive therapy  Z92.25     5. Stopped smoking with greater than 30 pack year history  Z87.891       Likely having an asthma attack in the severe cough is causing you to cough up blood  However, there are other things that have noticeable do that you are immunosuppressed because of your rheumatoid arthritis treatment and you have a previous history of smoking -> these things can also cause long-term lung damage  Plan -Z PAK - Please take prednisone 40 mg x1 day, then 30 mg x1 day, then 20 mg x1 day, then 10 mg x1 day, and then 5 mg x1 day and stop  -We took a shared decision making that you take prednisone despite your history of irritability  -If coughing up of blood gets worse please go to the ER -Do high-resolution CT chest supine and prone in 4-6 weeks (because of a history of smoking and also rheumatoid arthritis]  Follow-up - With Dr. Maple Hudson or nurse practitioner in 4-6 weeks but after CT scan

## 2023-07-04 NOTE — Progress Notes (Signed)
OV 07/04/2023  Subjective:  Patient ID: [name] [name], male , DOB: [birth year] , age 72 y.o. , MRN: [id number] , ADDRESS: [address] [place] [address] PCP Pollyann Savoy, MD Patient Care Team: Pollyann Savoy, MD as PCP - General (Rheumatology) Tat, Octaviano Batty, DO as Consulting Physician (Neurology)  This Provider for this visit: Treatment Team:  Attending Provider: Kalman Shan, MD    07/04/2023 -   Chief Complaint  Patient presents with   Acute Visit    Increased SOB, wheezing x 1 wk. He has had some hemoptysis x 3 days.      HPI [name] [name] 72 y.o. -acute visit Dr. Jetty Duhamel patient.  He is a former heavy smoker but quit many years ago.  He has rheumatoid arthritis and is on Areva.  Review of the records indicate that he has not had a CT scan of the chest.  He is here acutely because he says his asthma is acting up.  Is almost like he is never taken Trelegy.  He feels it is definitely asthma.  He is coughing more.  When the cough is really "violent" he is having hemoptysis spots of blood in the sink.  Again going on just for 3 to 5 days.  And some sore throat some increased wheezing and some shortness of breath but no edema no orthopnea no paroxysmal nocturnal dyspnea no chest pain.  He tries to avoid prednisone because it makes him irritable but he lives alone with his dog.  He says he is not a threat to himself or his dog when he becomes irritable.  In the last 1 month he has had 2 rounds of doxycycline with Dr. Maple Hudson but no prednisone.  He did have a COVID shot last week.  Everything feels like an asthma attack.  He did have a chest x-ray today.  Official report is not out yet.  I personally visualized it and it looks hyperinflated and clear without any mass or infiltrate.    Latest Reference Range & Units 08/24/16 14:04 11/07/16 08:36 02/15/17 10:43 07/19/17 14:07 02/13/18 09:51 08/12/19 09:40 12/07/19 08:59 07/25/20 09:33 09/19/20  11:35 11/14/20 08:49 01/10/21 10:52 04/06/21 09:40 05/18/21 09:31 08/10/21 10:03 11/02/21 09:54 12/14/21 09:35 02/08/22 09:36 06/01/22 09:51 09/26/22 09:34 11/21/22 10:16 03/08/23 12:28 04/04/23 13:08  Eosinophils Absolute 15 - 500 cells/uL 142 162 126 88 0.1 122 152 0.1 0.1 0.1 0.1 0.1 0.0 0.1 0.1 0.1 0.1 0.0 0.1 0.0 178 151    PFT     Latest Ref Rng & Units 11/01/2016   12:48 PM  PFT Results  FVC-Pre L 5.11   FVC-Predicted Pre % 118   FVC-Post L 5.30   FVC-Predicted Post % 123   Pre FEV1/FVC % % 74   Post FEV1/FCV % % 75   FEV1-Pre L 3.77   FEV1-Predicted Pre % 117   FEV1-Post L 4.00   DLCO uncorrected ml/min/mmHg 29.51   DLCO UNC% % 99   DLCO corrected ml/min/mmHg 32.79   DLCO COR %Predicted % 110   DLVA Predicted % 94   TLC L 8.70   TLC % Predicted % 131   RV % Predicted % 149       LAB RESULTS last 96 hours No results found.  LAB RESULTS last 90 days No results found for this or any previous visit (from the past 2160 hour(s)).       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), Esotropia, intermittent (12/29/2019), Generalized anxiety disorder, GERD (gastroesophageal reflux disease) (12/24/2007), Herpes zoster (08/01/2013), History of COVID-19 (10/05/2021), History of IBS (05/29/2018), Major depressive disorder in remission (07/15/2017), Mild neurocognitive disorder (09/14/2020), Polyp of nasal cavity (12/24/2007), Regular astigmatism of both eyes (12/29/2019), Rheumatoid arthritis of multiple sites without rheumatoid factor (09/18/2010), Seasonal and perennial allergic rhinitis (12/24/2007), Sinusitis, chronic (12/24/2007), and 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   reports that he quit smoking about 24 years ago. His smoking use included  cigarettes. He started smoking about 54 years ago. He has a 30 pack-year smoking history. He has been exposed to tobacco smoke. He quit smokeless tobacco use about 36 years ago.  His smokeless tobacco use included snuff and chew.  Past Surgical History:  Procedure Laterality Date   CARPAL TUNNEL RELEASE Left    extensive sinus surgery with ablation of the frontal sinuses     HERNIA REPAIR     MOUTH SURGERY  11/2019   nasal polypectomies     TOOTH EXTRACTION      Allergies  Allergen Reactions   Simponi [Golimumab]     Repeated infections   Aminophylline Other (See Comments)    Other reaction(s): Unknown   Aspirin     REACTION: throat swelling   Other Other (See Comments)    REACTION: oral sores Other reaction(s): rash   Theophyllines     Immunization History  Administered Date(s) Administered   Influenza Split 07/04/2009, 08/02/2011, 07/08/2012, 09/07/2012, 07/06/2013, 07/14/2015, 07/08/2016, 07/04/2020   Influenza, High Dose Seasonal PF 06/19/2017, 06/13/2018, 06/18/2019, 08/24/2019   Influenza,inj,Quad PF,6+ Mos 07/15/2014, 06/19/2017   Influenza-Unspecified 07/25/2016, 06/22/2022   PFIZER(Purple Top)SARS-COV-2 Vaccination 11/20/2019, 12/15/2019, 06/17/2020   Pneumococcal Conjugate-13 05/26/2018   Pneumococcal Polysaccharide-23 05/27/2006, 07/15/2014   Td 03/06/2007   Tdap 08/23/2012   Zoster Recombinant(Shingrix) 05/23/2018, 08/21/2018   Zoster, Live 04/24/2016, 05/20/2018, 07/30/2018    Family History  Problem Relation Age of Onset   Multiple sclerosis Father    Breast cancer Mother    Breast cancer Sister    Alcoholism Child        now sober     Current Outpatient Medications:    acetaminophen (TYLENOL) 325 MG tablet, Take 650 mg by mouth every 6 (six) hours as needed., Disp: , Rfl:    albuterol (PROAIR HFA) 108 (90 Base) MCG/ACT inhaler, INHALE 1-2 PUFFS EVERY 6 HOURS AS NEEDED FOR WHEEZING OR SHORTNESS OF BREATH, Disp: 8.5 g, Rfl: 12   fluocinonide (LIDEX)  0.05 % external solution, Apply 1 Application topically at bedtime., Disp: , Rfl:    fluticasone (FLONASE) 50 MCG/ACT nasal spray, Place 1 spray into both nostrils daily., Disp: 48 g, Rfl: 4   Fluticasone-Umeclidin-Vilant (TRELEGY ELLIPTA) 100-62.5-25 MCG/ACT AEPB, INHALE 1 PUFF BY MOUTH DAILY, Disp: 60 each, Rfl: 6   guaiFENesin (MUCUS RELIEF ADULT PO), Take by mouth as needed., Disp: , Rfl:    ketoconazole (NIZORAL) 2 % shampoo, Apply 1 Application topically every other day., Disp: , Rfl:    leflunomide (ARAVA) 10 MG tablet, TAKE 1 TABLET BY MOUTH DAILY, Disp: 30 tablet, Rfl: 2   loratadine (CLARITIN) 10 MG tablet, Take 10 mg by mouth as needed., Disp: , Rfl:    losartan-hydrochlorothiazide (HYZAAR) 100-25 MG tablet, Take 1 tablet by mouth daily. , Disp: , Rfl:    montelukast (SINGULAIR) 10 MG tablet, TAKE ONE TABLET BY MOUTH DAILY, Disp: 90  tablet, Rfl: 2   nystatin (MYCOSTATIN) 100000 UNIT/ML suspension, Take 5 mLs by mouth daily. prn, Disp: , Rfl:    pantoprazole (PROTONIX) 40 MG tablet, Take 40 mg by mouth daily., Disp: , Rfl:    Probiotic Product (PROBIOTIC PO), Take 1 tablet by mouth daily., Disp: , Rfl:       Objective:   Vitals:   07/04/23 0916  BP: 112/70  Pulse: 68  Temp: 98.6 F (37 C)  TempSrc: Oral  SpO2: 98%  Weight: 162 lb (73.5 kg)  Height: 5\' 7"  (1.702 m)    Estimated body mass index is 25.37 kg/m as calculated from the following:   Height as of this encounter: 5\' 7"  (1.702 m).   Weight as of this encounter: 162 lb (73.5 kg).  @WEIGHTCHANGE @  [facility] Weights   07/04/23 0916  Weight: 162 lb (73.5 kg)     Physical Exam   General: No distress. Looks well O2 at rest: no Cane present: no Sitting in wheel chair: no Frail: no Obese: no Neuro: Alert and Oriented x 3. GCS 15. Speech normal Psych: Pleasant Resp:  Barrel Chest - no.  Wheeze - no, Crackles - no, No overt respiratory distress CVS: Normal heart sounds. Murmurs - no Ext: Stigmata of Connective  Tissue Disease - no HEENT: Normal upper airway. PEERL +. No post nasal drip        Assessment:       ICD-10-CM   1. Mild persistent asthmatic bronchitis with acute exacerbation  J45.31 DG Chest 2 View    2. Hemoptysis  R04.2     3. History of rheumatoid arthritis  Z87.39     4. Increased infection risk status post immunosuppressive therapy  Z92.25     5. Stopped smoking with greater than 30 pack year history  Z87.891          Plan:     Patient Instructions     ICD-10-CM   1. Mild persistent asthmatic bronchitis with acute exacerbation  J45.31 DG Chest 2 View    2. Hemoptysis  R04.2     3. History of rheumatoid arthritis  Z87.39     4. Increased infection risk status post immunosuppressive therapy  Z92.25     5. Stopped smoking with greater than 30 pack year history  Z87.891       Likely having an asthma attack in the severe cough is causing you to cough up blood  However, there are other things that have noticeable do that you are immunosuppressed because of your rheumatoid arthritis treatment and you have a previous history of smoking -> these things can also cause long-term lung damage  Plan -Z PAK - Please take prednisone 40 mg x1 day, then 30 mg x1 day, then 20 mg x1 day, then 10 mg x1 day, and then 5 mg x1 day and stop  -We took a shared decision making that you take prednisone despite your history of irritability  -If coughing up of blood gets worse please go to the ER -Do high-resolution CT chest supine and prone in 4-6 weeks (because of a history of smoking and also rheumatoid arthritis]  Follow-up - With Dr. Maple Hudson or nurse practitioner in 4-6 weeks but after CT scan     FOLLOWUP Return in about 5 weeks (around 08/08/2023) for with any of the APPS, Face to Face OR Video Visit.    SIGNATURE    Dr. Kalman Shan, M.D., F.C.C.P,  Pulmonary and Critical Care Medicine Staff Physician,  [facility] Center Director - Interstitial Lung  Disease  Program  Pulmonary Fibrosis [facility] [place], [place], [address]  Pager: [phone], If no answer or between  15:00h - 7:00h: call [phone] Telephone: [phone]  9:40 AM 07/04/2023

## 2023-07-05 LAB — COMPLETE METABOLIC PANEL WITH GFR
AG Ratio: 1.6 (calc) (ref 1.0–2.5)
ALT: 14 U/L (ref 9–46)
AST: 17 U/L (ref 10–35)
Albumin: 4.1 g/dL (ref 3.6–5.1)
Alkaline phosphatase (APISO): 57 U/L (ref 35–144)
BUN: 15 mg/dL (ref 7–25)
CO2: 28 mmol/L (ref 20–32)
Calcium: 9.3 mg/dL (ref 8.6–10.3)
Chloride: 105 mmol/L (ref 98–110)
Creat: 1.02 mg/dL (ref 0.70–1.28)
Globulin: 2.6 g/dL (ref 1.9–3.7)
Glucose, Bld: 91 mg/dL (ref 65–99)
Potassium: 4.2 mmol/L (ref 3.5–5.3)
Sodium: 140 mmol/L (ref 135–146)
Total Bilirubin: 0.5 mg/dL (ref 0.2–1.2)
Total Protein: 6.7 g/dL (ref 6.1–8.1)
eGFR: 79 mL/min/{1.73_m2} (ref >=60)

## 2023-07-05 NOTE — Progress Notes (Signed)
CBC and CMP are stable.

## 2023-07-22 NOTE — Progress Notes (Deleted)
Office Visit Note  Patient: [name] [name]             Date of Birth: 11/09/1950           MRN: [id number]             PCP: Pollyann Savoy, MD Referring: Pollyann Savoy, MD Visit Date: 08/01/2023 Occupation: @GUAROCC @  Subjective:  No chief complaint on file.   History of Present Illness: [name] [name] is a 72 y.o. male ***     Activities of Daily Living:  Patient reports morning stiffness for *** {minute/hour:19697}.   Patient {ACTIONS;DENIES/REPORTS:21021675::"Denies"} nocturnal pain.  Difficulty dressing/grooming: {ACTIONS;DENIES/REPORTS:21021675::"Denies"} Difficulty climbing stairs: {ACTIONS;DENIES/REPORTS:21021675::"Denies"} Difficulty getting out of chair: {ACTIONS;DENIES/REPORTS:21021675::"Denies"} Difficulty using hands for taps, buttons, cutlery, and/or writing: {ACTIONS;DENIES/REPORTS:21021675::"Denies"}  No Rheumatology ROS completed.   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   Asthmatic bronchitis, mild intermittent, uncomplicated 02/12/2017   Herpes zoster 08/01/2013   Rheumatoid arthritis of multiple sites without rheumatoid factor (HCC) 09/18/2010   Polyp of nasal cavity 12/24/2007   Sinusitis, chronic 12/24/2007   Seasonal and perennial allergic rhinitis 12/24/2007   Allergic-infective asthma 12/24/2007   GERD (gastroesophageal reflux disease) 12/24/2007    Past Medical History:  Diagnosis Date    Allergic-infective asthma 12/24/2007   FENO- 08/07/16- 25 Office spirometry 08/07/16- WNL, FEV1 3.59/ 110%, R 0.81    Asthmatic bronchitis, mild intermittent, uncomplicated 02/12/2017   PFT 11/01/16- WNL-FVC 5.30/123%, FEV1 4.0/124%, ratio 0.75, FEF 25-75% 3.49/135%, TLC 131%, DLCO 99%  Last Assessment & Plan:  Doing well with Trelegy. Suggested protective mask if unavoidable dust.   Bipolar 1 disorder    Coarse tremors 12/29/2019   Upper extremities, bilateral   Diplopia 12/29/2019   Disorder of anterior pituitary 10/31/2021   Esotropia, intermittent 12/29/2019   Generalized anxiety disorder    GERD (gastroesophageal reflux disease) 12/24/2007   Herpes zoster 08/01/2013   October, 2014; right lateral chest   History of COVID-19 10/05/2021   History of IBS 05/29/2018   Major depressive disorder in remission 07/15/2017   Mild neurocognitive disorder 09/14/2020   Polyp of nasal cavity 12/24/2007   Regular astigmatism of both eyes 12/29/2019   Rheumatoid arthritis of multiple sites without rheumatoid factor 09/18/2010   Seasonal and perennial allergic rhinitis 12/24/2007   Sinusitis, chronic 12/24/2007   Recurrent acute maxillary and frontal sinusitis     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    Family History  Problem Relation Age of Onset   Multiple sclerosis Father    Breast cancer Mother    Breast cancer Sister    Alcoholism Child        now sober   Past Surgical History:  Procedure Laterality Date   CARPAL TUNNEL RELEASE Left    extensive sinus surgery with ablation of the frontal sinuses     HERNIA REPAIR     MOUTH SURGERY  11/2019   nasal polypectomies     TOOTH EXTRACTION  Social History   Social History Narrative   Right handed    Lives alone    Immunization History  Administered Date(s) Administered   Influenza Split 07/04/2009, 08/02/2011, 07/08/2012, 09/07/2012, 07/06/2013, 07/14/2015, 07/08/2016, 07/04/2020   Influenza, High Dose  Seasonal PF 06/19/2017, 06/13/2018, 06/18/2019, 08/24/2019   Influenza,inj,Quad PF,6+ Mos 07/15/2014, 06/19/2017   Influenza-Unspecified 07/25/2016, 06/22/2022   PFIZER(Purple Top)SARS-COV-2 Vaccination 11/20/2019, 12/15/2019, 06/17/2020   Pneumococcal Conjugate-13 05/26/2018   Pneumococcal Polysaccharide-23 05/27/2006, 07/15/2014   Td 03/06/2007   Tdap 08/23/2012   Zoster Recombinant(Shingrix) 05/23/2018, 08/21/2018   Zoster, Live 04/24/2016, 05/20/2018, 07/30/2018     Objective: Vital Signs: There were no vitals taken for this visit.   Physical Exam   Musculoskeletal Exam: ***  CDAI Exam: CDAI Score: -- Patient Global: --; Provider Global: -- Swollen: --; Tender: -- Joint Exam 08/01/2023   No joint exam has been documented for this visit   There is currently no information documented on the homunculus. Go to the Rheumatology activity and complete the homunculus joint exam.  Investigation: No additional findings.  Imaging: DG Chest 2 View  Result Date: 07/04/2023 CLINICAL DATA:  Hemoptysis and asthma EXAM: CHEST - 2 VIEW COMPARISON:  11/03/2019 FINDINGS: Normal heart size and mediastinal contours. Normal inflation. No acute infiltrate or edema. No effusion or pneumothorax. No acute osseous findings. Degenerative spurring at the glenohumeral joints. IMPRESSION: Stable and unremarkable chest. Electronically Signed   By: Tiburcio Pea M.D.   On: 07/04/2023 10:48    Recent Labs: Lab Results  Component Value Date   WBC 6.3 07/04/2023   HGB 13.3 07/04/2023   PLT 246 07/04/2023   NA 140 07/04/2023   K 4.2 07/04/2023   CL 105 07/04/2023   CO2 28 07/04/2023   GLUCOSE 91 07/04/2023   BUN 15 07/04/2023   CREATININE 1.02 07/04/2023   BILITOT 0.5 07/04/2023   ALKPHOS 55 11/21/2022   AST 17 07/04/2023   ALT 14 07/04/2023   PROT 6.7 07/04/2023   ALBUMIN 3.8 11/21/2022   CALCIUM 9.3 07/04/2023   GFRAA >60 05/23/2020   QFTBGOLD NEGATIVE 07/16/2017   QFTBGOLDPLUS Negative  09/26/2022    Speciality Comments: Orencia 03/19, Humira 2017-2019, Simponi10/14-06/16, MTX 05/18-03/19  Procedures:  No procedures performed Allergies: Simponi [golimumab], Aminophylline, Aspirin, Other, and Theophyllines   Assessment / Plan:     Visit Diagnoses: No diagnosis found.  Orders: No orders of the defined types were placed in this encounter.  No orders of the defined types were placed in this encounter.   Face-to-face time spent with patient was *** minutes. Greater than 50% of time was spent in counseling and coordination of care.  Follow-Up Instructions: No follow-ups on file.   Ellen Henri, CMA  Note - This record has been created using Animal nutritionist.  Chart creation errors have been sought, but may not always  have been located. Such creation errors do not reflect on  the standard of medical care.

## 2023-07-27 DIAGNOSIS — Z23 Encounter for immunization: Secondary | ICD-10-CM | POA: Diagnosis not present

## 2023-07-30 ENCOUNTER — Ambulatory Visit
Admission: RE | Admit: 2023-07-30 | Discharge: 2023-07-30 | Disposition: A | Discharge status: Home / Self Care | Payer: Medicare Other | Source: Ambulatory Visit | Attending: Internal Medicine | Admitting: Internal Medicine

## 2023-07-30 DIAGNOSIS — Z8739 Personal history of other diseases of the musculoskeletal system and connective tissue: Secondary | ICD-10-CM

## 2023-07-30 DIAGNOSIS — R918 Other nonspecific abnormal finding of lung field: Secondary | ICD-10-CM | POA: Diagnosis not present

## 2023-07-30 DIAGNOSIS — J4531 Mild persistent asthma with (acute) exacerbation: Secondary | ICD-10-CM

## 2023-07-30 DIAGNOSIS — R059 Cough, unspecified: Secondary | ICD-10-CM | POA: Diagnosis not present

## 2023-07-30 DIAGNOSIS — R042 Hemoptysis: Secondary | ICD-10-CM

## 2023-07-30 DIAGNOSIS — Z9225 Personal history of immunosupression therapy: Secondary | ICD-10-CM

## 2023-07-30 DIAGNOSIS — Z87891 Personal history of nicotine dependence: Secondary | ICD-10-CM

## 2023-08-01 ENCOUNTER — Ambulatory Visit: Payer: Medicare Other | Admitting: Rheumatology

## 2023-08-01 DIAGNOSIS — Z820 Family history of epilepsy and other diseases of the nervous system: Secondary | ICD-10-CM

## 2023-08-01 DIAGNOSIS — M19041 Primary osteoarthritis, right hand: Secondary | ICD-10-CM

## 2023-08-01 DIAGNOSIS — Z8659 Personal history of other mental and behavioral disorders: Secondary | ICD-10-CM

## 2023-08-01 DIAGNOSIS — Z79899 Other long term (current) drug therapy: Secondary | ICD-10-CM

## 2023-08-01 DIAGNOSIS — R413 Other amnesia: Secondary | ICD-10-CM

## 2023-08-01 DIAGNOSIS — G8929 Other chronic pain: Secondary | ICD-10-CM

## 2023-08-01 DIAGNOSIS — M0609 Rheumatoid arthritis without rheumatoid factor, multiple sites: Secondary | ICD-10-CM

## 2023-08-01 DIAGNOSIS — Z8719 Personal history of other diseases of the digestive system: Secondary | ICD-10-CM

## 2023-08-01 DIAGNOSIS — G252 Other specified forms of tremor: Secondary | ICD-10-CM

## 2023-08-01 DIAGNOSIS — Z8709 Personal history of other diseases of the respiratory system: Secondary | ICD-10-CM

## 2023-08-16 ENCOUNTER — Telehealth: Payer: Self-pay | Admitting: Internal Medicine

## 2023-08-16 DIAGNOSIS — R911 Solitary pulmonary nodule: Secondary | ICD-10-CM

## 2023-08-16 NOTE — Telephone Encounter (Signed)
Will forward to triage and MR to address HRCT results.

## 2023-08-16 NOTE — Telephone Encounter (Signed)
CT from October 22nd

## 2023-08-18 NOTE — Telephone Encounter (Signed)
   There is a 14 mm nodule and age 72, male and prior smoking hx > there is always a concern if it is lung cance but it does NOT look like lung cancer.   Plan    - recommend PET SCAN first week dec 2024 and see SArah/ICad/Byrum for followup      reports that he quit smoking about 24 years ago. His smoking use included cigarettes. He started smoking about 54 years ago. He has a 30 pack-year smoking history. He has been exposed to tobacco smoke. He quit smokeless tobacco use about 36 years ago.  His smokeless tobacco use included snuff and chew.      IMPRESSION: 1. No evidence fibrotic interstitial lung disease or air trapping. 2. Nodular consolidation in the posterior segment right upper lobe, likely infectious/inflammatory in etiology. Malignancy cannot be excluded. Follow-up CT chest without contrast in 3-4 weeks, from today's study, is recommended to ensure resolution. These results will be called to the ordering clinician or representative by the Radiologist Assistant, and communication documented in the PACS or [employer].     Electronically Signed   By: Leanna Battles M.D.   On: 08/16/2023 08:47

## 2023-08-19 NOTE — Telephone Encounter (Signed)
Patient is scheduled for 08/30/2023 and has a follow up scheduled for 09/09/23 patient is aware of this appt

## 2023-08-19 NOTE — Telephone Encounter (Signed)
Spoke to patient and relayed below message/recommendations. He agrees with PET. Order has been placed.   PCC's, please let triage know once PET has been scheduled, so we can schedule f/u. Thanks

## 2023-08-20 NOTE — Telephone Encounter (Signed)
Secure chat sent to Dr. Marchelle Gearing to make him aware that the PET has been scheduled and the follow up OV.  Nothing further needed.

## 2023-08-21 ENCOUNTER — Ambulatory Visit: Payer: Medicare Other | Admitting: Adult Health

## 2023-08-21 ENCOUNTER — Encounter: Payer: Self-pay | Admitting: Adult Health

## 2023-08-21 VITALS — BP 132/78 | HR 66 | Temp 98.3°F | Ht 67.0 in | Wt 169.4 lb

## 2023-08-21 DIAGNOSIS — J3089 Other allergic rhinitis: Secondary | ICD-10-CM | POA: Diagnosis not present

## 2023-08-21 DIAGNOSIS — J302 Other seasonal allergic rhinitis: Secondary | ICD-10-CM | POA: Diagnosis not present

## 2023-08-21 DIAGNOSIS — M0609 Rheumatoid arthritis without rheumatoid factor, multiple sites: Secondary | ICD-10-CM | POA: Diagnosis not present

## 2023-08-21 DIAGNOSIS — J4531 Mild persistent asthma with (acute) exacerbation: Secondary | ICD-10-CM | POA: Diagnosis not present

## 2023-08-21 DIAGNOSIS — R042 Hemoptysis: Secondary | ICD-10-CM | POA: Diagnosis not present

## 2023-08-21 NOTE — Patient Instructions (Addendum)
PET scan as planned next month.  Refer to Rheumatology for second opinion.   Continue on Trelegy 1 puff daily , rinse after use  Continue on Claritin and Singulair daily  Albuterol inhaler As needed   Set up PFT -first available.   Follow up with Dr. Maple Hudson  as planned in early December.

## 2023-08-21 NOTE — Progress Notes (Signed)
@Patient  ID: [name] [name], male    DOB: [birth year], 72 y.o.   MRN: [id number]  Chief Complaint  Patient presents with   Follow-up    Referring provider: Pollyann Savoy, MD  HPI: 72 year old male former smoker followed for asthma, allergic rhinitis, nasal polyps Medic history significant for rheumatoid arthritis and mild memory impairment   TEST/EVENTS :  FENO- 08/07/16- 25 Office spirometry 08/07/16- WNL, FEV1 3.59/ 110%, R 0.81 PFT 11/01/16- WNL-FVC 5.30/123%, FEV1 4.0/124%, ratio 0.75, FEF 25-75% 3.49/135%, TLC 131%, DLCO 99% Quantiferon-TB Gold 08/12/19- NEG  08/21/2023 Follow up : Asthma, AR  Patient presents for a 6-week follow-up.  Patient was seen last visit for acute asthmatic bronchitic exacerbation.  He presented with severe cough, wheezing and blood-tinged mucus.  He was started on a Z-Pak and prednisone taper.  Chest x-ray was clear.  Patient was set up for high-resolution CT chest that was completed on July 30, 2023 that showed no evidence of fibrotic interstitial lung disease or air trapping.  There was a nodular consolidation in the right upper lobe measuring 9 x 14 mm.  With patient's history of smoking and hemoptysis he has been set up for a PET scan to rule out underlying potential malignancy. Previous pulmonary function testing in the past has been normal.  Last PFT was 2018. Patient is very active.  Is able to drive.  Lives at home.  He is able to do light house chores.  Does go to the gym daily.  He is maintained on Trelegy, Claritin, Xyzal and Singulair daily.  Since last visit patient says he is feeling better.  His cough and congestion have improved.  He has had no further hemoptysis.  He has had no and intentional weight loss.  Appetite is fair.  Patient does have rheumatoid arthritis.  Was recently on leflunomide but has stopped this due to his recent illness.  Patient says that all of the rheumatological medications he gets recurrent infections.  He  would like a referral for second opinion for rheumatology.  Allergies  Allergen Reactions   Simponi [Golimumab]     Repeated infections   Aminophylline Other (See Comments)    Other reaction(s): Unknown   Aspirin     REACTION: throat swelling   Other Other (See Comments)    REACTION: oral sores Other reaction(s): rash   Theophyllines     Immunization History  Administered Date(s) Administered   Fluad Quad(high Dose 65+) 07/23/2023   Influenza Split 07/04/2009, 08/02/2011, 07/08/2012, 09/07/2012, 07/06/2013, 07/14/2015, 07/08/2016, 07/04/2020   Influenza, High Dose Seasonal PF 06/19/2017, 06/13/2018, 06/18/2019, 08/24/2019   Influenza,inj,Quad PF,6+ Mos 07/15/2014, 06/19/2017   Influenza-Unspecified 07/25/2016, 06/22/2022   PFIZER(Purple Top)SARS-COV-2 Vaccination 11/20/2019, 12/15/2019, 06/17/2020, 06/27/2023   Pneumococcal Conjugate-13 05/26/2018   Pneumococcal Polysaccharide-23 05/27/2006, 07/15/2014   Td 03/06/2007   Tdap 08/23/2012   Zoster Recombinant(Shingrix) 05/23/2018, 08/21/2018   Zoster, Live 04/24/2016, 05/20/2018, 07/30/2018    Past Medical History:  Diagnosis Date   Allergic-infective asthma 12/24/2007   FENO- 08/07/16- 25 Office spirometry 08/07/16- WNL, FEV1 3.59/ 110%, R 0.81    Asthmatic bronchitis, mild intermittent, uncomplicated 02/12/2017   PFT 11/01/16- WNL-FVC 5.30/123%, FEV1 4.0/124%, ratio 0.75, FEF 25-75% 3.49/135%, TLC 131%, DLCO 99%  Last Assessment & Plan:  Doing well with Trelegy. Suggested protective mask if unavoidable dust.   Bipolar 1 disorder    Coarse tremors 12/29/2019   Upper extremities, bilateral   Diplopia 12/29/2019   Disorder of anterior pituitary 10/31/2021   Esotropia, intermittent 12/29/2019  Generalized anxiety disorder    GERD (gastroesophageal reflux disease) 12/24/2007   Herpes zoster 08/01/2013   October, 2014; right lateral chest   History of COVID-19 10/05/2021   History of IBS 05/29/2018   Major depressive  disorder in remission 07/15/2017   Mild neurocognitive disorder 09/14/2020   Polyp of nasal cavity 12/24/2007   Regular astigmatism of both eyes 12/29/2019   Rheumatoid arthritis of multiple sites without rheumatoid factor 09/18/2010   Seasonal and perennial allergic rhinitis 12/24/2007   Sinusitis, chronic 12/24/2007   Recurrent acute maxillary and frontal sinusitis     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     albuterol (PROAIR HFA) 108 (90 Base) MCG/ACT inhaler INHALE 1-2 PUFFS EVERY 6 HOURS AS NEEDED FOR WHEEZING OR SHORTNESS OF BREATH 8.5 g 12   fluocinonide (LIDEX) 0.05 % external solution Apply 1 Application topically at bedtime.     fluticasone (FLONASE) 50 MCG/ACT nasal spray Place 1 spray into both nostrils daily. 48 g 4   Fluticasone-Umeclidin-Vilant (TRELEGY ELLIPTA) 100-62.5-25 MCG/ACT AEPB INHALE 1 PUFF BY MOUTH DAILY 60 each 6   guaiFENesin (MUCUS RELIEF ADULT PO) Take by mouth as needed.     ketoconazole (NIZORAL) 2 % shampoo Apply 1 Application topically every other day.     loratadine (CLARITIN) 10 MG tablet Take 10 mg by mouth as needed.     losartan-hydrochlorothiazide (HYZAAR) 100-25 MG tablet Take 1 tablet by mouth daily.      montelukast (SINGULAIR) 10 MG tablet TAKE ONE TABLET BY MOUTH DAILY 90 tablet 2   nystatin (MYCOSTATIN) 100000 UNIT/ML suspension Take 5 mLs by mouth daily. prn     pantoprazole  (PROTONIX) 40 MG tablet Take 40 mg by mouth daily.     Probiotic Product (PROBIOTIC PO) Take 1 tablet by mouth daily.     azithromycin (ZITHROMAX) 250 MG tablet Use as directed (Patient not taking: Reported on 08/21/2023) 6 tablet 0   leflunomide (ARAVA) 10 MG tablet TAKE 1 TABLET BY MOUTH DAILY 30 tablet 2   predniSONE (DELTASONE) 10 MG tablet Take 1 tablet (10 mg total) by mouth daily with breakfast. (Patient not taking: Reported on 08/21/2023) 11 tablet 0   No facility-administered medications prior to visit.     Review of Systems:   Constitutional:   No  weight loss, night sweats,  Fevers, chills, fatigue, or  lassitude.  HEENT:   No headaches,  Difficulty swallowing,  Tooth/dental problems, or  Sore throat,                No sneezing, itching, ear ache, nasal congestion, post nasal drip,   CV:  No chest pain,  Orthopnea, PND, swelling in lower extremities, anasarca, dizziness, palpitations, syncope.   GI  No heartburn, indigestion, abdominal pain, nausea, vomiting, diarrhea, change in bowel habits, loss of appetite, bloody stools.   Resp: No shortness of breath with  exertion or at rest.  No excess mucus, no productive cough,  No non-productive cough,  No coughing up of blood.  No change in color of mucus.  No wheezing.  No chest wall deformity  Skin: no rash or lesions.  GU: no dysuria, change in color of urine, no urgency or frequency.  No flank pain, no hematuria   MS:  No joint pain or swelling.  No decreased range of motion.  No back pain.    Physical Exam  BP 132/78 (BP Location: Left Arm, Cuff Size: Normal)   Pulse 66   Temp 98.3 F (36.8 C) (Temporal)   Ht 5\' 7"  (1.702 m)   Wt 169 lb 6.4 oz (76.8 kg)   SpO2 100%   BMI 26.53 kg/m   GEN: A/Ox3; pleasant , NAD, well nourished    HEENT:  [place]/AT,  NOSE-clear, THROAT-clear, no lesions, no postnasal drip or exudate noted.   NECK:  Supple w/ fair ROM; no JVD; normal carotid impulses w/o bruits; no thyromegaly or  nodules palpated; no lymphadenopathy.    RESP  Clear  P & A; w/o, wheezes/ rales/ or rhonchi. no accessory muscle use, no dullness to percussion  CARD:  RRR, no m/r/g, no peripheral edema, pulses intact, no cyanosis or clubbing.  GI:   Soft & nt; nml bowel sounds; no organomegaly or masses detected.   Musco: Warm bil, no deformities or joint swelling noted.   Neuro: alert, no focal deficits noted.    Skin: Warm, no lesions or rashes    Lab Results:  CBC    Component Value Date/Time   WBC 6.3 07/04/2023 1008   RBC 4.45 07/04/2023 1008   HGB 13.3 07/04/2023 1008   HCT 41.0 07/04/2023 1008   PLT 246 07/04/2023 1008   MCV 92.1 07/04/2023 1008   MCH 29.9 07/04/2023 1008   MCHC 32.4 07/04/2023 1008   RDW 12.9 07/04/2023 1008   LYMPHSABS 1,613 07/04/2023 1008   MONOABS 0.6 11/21/2022 1016   EOSABS 139 07/04/2023 1008   BASOSABS 57 07/04/2023 1008    BMET    Component Value Date/Time   NA 140 07/04/2023 1008   K 4.2 07/04/2023 1008   CL 105 07/04/2023 1008   CO2 28 07/04/2023 1008   GLUCOSE 91 07/04/2023 1008   BUN 15 07/04/2023 1008   CREATININE 1.02 07/04/2023 1008   CALCIUM 9.3 07/04/2023 1008   GFRNONAA >60 11/21/2022 1016   GFRNONAA 69 12/07/2019 0859   GFRAA >60 05/23/2020 0908   GFRAA 80 12/07/2019 0859    BNP No results found for: "BNP"  ProBNP No results found for: "PROBNP"  Imaging: CT Chest High Resolution  Result Date: 08/16/2023 CLINICAL DATA:  Persistent cough. Mild persistent asthmatic bronchitis with acute exacerbation, hemoptysis. Rheumatoid arthritis. On immunosuppressive therapy. Former smoker. EXAM: CT CHEST WITHOUT CONTRAST TECHNIQUE: Multidetector CT imaging of the chest was performed following the standard protocol without intravenous contrast. High resolution imaging of the lungs, as well as inspiratory and expiratory imaging, was performed. RADIATION DOSE REDUCTION: This exam was performed according to the departmental dose-optimization  program which includes automated exposure control, adjustment of the mA and/or kV according to patient size and/or use of iterative reconstruction technique. COMPARISON:  None available. FINDINGS: Cardiovascular: Atherosclerotic calcification of the aorta and coronary arteries. Enlarged pulmonic trunk. Heart size normal. No pericardial effusion. Ascending aorta measures up to 3.9 cm (5/80). No pathologically enlarged mediastinal or axillary lymph nodes. Hilar regions are difficult to definitively evaluate without IV  contrast. Esophagus is grossly unremarkable. Mediastinum/Nodes: Visualized portions of the liver, gallbladder, adrenal glands, kidneys, spleen, pancreas, stomach and bowel are grossly unremarkable. No upper abdominal adenopathy. Lungs/Pleura: Negative for subpleural reticulation, traction bronchiectasis/bronchiolectasis, ground glass, architectural distortion or honeycombing. Mild cylindrical bronchiectasis. Focal nodular consolidation in the posterior segment right upper lobe measures 9 x 14 mm (9/75). No pleural fluid. Airway is unremarkable. No air trapping. Upper Abdomen: Visualized portions of the liver, gallbladder, adrenal glands, kidneys, spleen, pancreas, stomach and bowel are grossly unremarkable. No upper abdominal adenopathy. Musculoskeletal: Degenerative changes in the spine. No worrisome lytic or sclerotic lesions. IMPRESSION: 1. No evidence fibrotic interstitial lung disease or air trapping. 2. Nodular consolidation in the posterior segment right upper lobe, likely infectious/inflammatory in etiology. Malignancy cannot be excluded. Follow-up CT chest without contrast in 3-4 weeks, from today's study, is recommended to ensure resolution. These results will be called to the ordering clinician or representative by the Radiologist Assistant, and communication documented in the PACS or [employer]. Electronically Signed   By: Leanna Battles M.D.   On: 08/16/2023 08:47    Administration  History     None          Latest Ref Rng & Units 11/01/2016   12:48 PM  PFT Results  FVC-Pre L 5.11   FVC-Predicted Pre % 118   FVC-Post L 5.30   FVC-Predicted Post % 123   Pre FEV1/FVC % % 74   Post FEV1/FCV % % 75   FEV1-Pre L 3.77   FEV1-Predicted Pre % 117   FEV1-Post L 4.00   DLCO uncorrected ml/min/mmHg 29.51   DLCO UNC% % 99   DLCO corrected ml/min/mmHg 32.79   DLCO COR %Predicted % 110   DLVA Predicted % 94   TLC L 8.70   TLC % Predicted % 131   RV % Predicted % 149     Lab Results  Component Value Date   NITRICOXIDE 25 08/07/2016        Assessment & Plan:   No problem-specific Assessment & Plan notes found for this encounter.     Rubye Oaks, NP 08/21/2023

## 2023-08-22 ENCOUNTER — Ambulatory Visit (HOSPITAL_BASED_OUTPATIENT_CLINIC_OR_DEPARTMENT_OTHER): Payer: Medicare Other

## 2023-08-22 DIAGNOSIS — J4531 Mild persistent asthma with (acute) exacerbation: Secondary | ICD-10-CM

## 2023-08-22 DIAGNOSIS — R042 Hemoptysis: Secondary | ICD-10-CM | POA: Insufficient documentation

## 2023-08-22 DIAGNOSIS — J45909 Unspecified asthma, uncomplicated: Secondary | ICD-10-CM | POA: Insufficient documentation

## 2023-08-22 LAB — PULMONARY FUNCTION TEST
DL/VA % pred: 91 %
DL/VA: 3.72 ml/min/mmHg/L
DLCO unc % pred: 116 %
DLCO unc: 28.1 ml/min/mmHg
FEF 25-75 Post: 3.47 L/s
FEF 25-75 Pre: 2.98 L/s
FEF2575-%Change-Post: 16 %
FEF2575-%Pred-Post: 154 %
FEF2575-%Pred-Pre: 133 %
FEV1-%Change-Post: 3 %
FEV1-%Pred-Post: 137 %
FEV1-%Pred-Pre: 133 %
FEV1-Post: 4.09 L
FEV1-Pre: 3.96 L
FEV1FVC-%Change-Post: 4 %
FEV1FVC-%Pred-Pre: 101 %
FEV6-%Change-Post: 0 %
FEV6-%Pred-Post: 137 %
FEV6-%Pred-Pre: 138 %
FEV6-Post: 5.25 L
FEV6-Pre: 5.28 L
FEV6FVC-%Change-Post: 0 %
FEV6FVC-%Pred-Post: 105 %
FEV6FVC-%Pred-Pre: 105 %
FVC-%Change-Post: -1 %
FVC-%Pred-Post: 130 %
FVC-%Pred-Pre: 131 %
FVC-Post: 5.28 L
FVC-Pre: 5.34 L
Post FEV1/FVC ratio: 77 %
Post FEV6/FVC ratio: 99 %
Pre FEV1/FVC ratio: 74 %
Pre FEV6/FVC Ratio: 99 %
RV % pred: 134 %
RV: 3.17 L
TLC % pred: 131 %
TLC: 8.74 L

## 2023-08-22 NOTE — Assessment & Plan Note (Signed)
Resolved.  Episode of hemoptysis during recent asthmatic bronchitic exacerbation.  CT chest did show a nodular opacity in the right upper lobe.  Subsequent PET scan has been ordered.

## 2023-08-22 NOTE — Patient Instructions (Signed)
Full PFT Performed Today  

## 2023-08-22 NOTE — Assessment & Plan Note (Signed)
Referral for second opinion with rheumatology per patient request sent

## 2023-08-22 NOTE — Progress Notes (Signed)
Full PFT Performed Today  

## 2023-08-22 NOTE — Assessment & Plan Note (Signed)
Continue on current maintenance regimen

## 2023-08-22 NOTE — Assessment & Plan Note (Addendum)
Recent exacerbation now resolved.  Continue on aggressive maintenance regimen Check PFTs.  Plan  . Patient Instructions  PET scan as planned next month.  Refer to Rheumatology for second opinion.   Continue on Trelegy 1 puff daily , rinse after use  Continue on Claritin and Singulair daily  Albuterol inhaler As needed   Set up PFT -first available.   Follow up with Dr. Maple Hudson  as planned in early December.

## 2023-08-30 ENCOUNTER — Encounter (HOSPITAL_COMMUNITY)
Admission: RE | Admit: 2023-08-30 | Discharge: 2023-08-30 | Disposition: A | Discharge status: Home / Self Care | Payer: Medicare Other | Source: Ambulatory Visit | Attending: Internal Medicine | Admitting: Internal Medicine

## 2023-08-30 DIAGNOSIS — R911 Solitary pulmonary nodule: Secondary | ICD-10-CM | POA: Insufficient documentation

## 2023-08-30 LAB — GLUCOSE, CAPILLARY: Glucose-Capillary: 89 mg/dL (ref 70–99)

## 2023-08-30 MED ORDER — FLUDEOXYGLUCOSE F - 18 (FDG) INJECTION
8.4000 | Freq: Once | INTRAVENOUS | Status: AC | PRN
  Administered 2023-08-30: 8.4 via INTRAVENOUS

## 2023-09-06 NOTE — Progress Notes (Unsigned)
HPI M former smoker followed for asthma, allergic rhinitis, hx rhinosinusitis, nasal polyps complicated by rheumatoid arthritis/ Orencia, Bipolar, GERD, Alzheimers,. FENO- 08/07/16- 25 Office spirometry 08/07/16- WNL, FEV1 3.59/ 110%, R 0.81 PFT 11/01/16- WNL-FVC 5.30/123%, FEV1 4.0/124%, ratio 0.75, FEF 25-75% 3.49/135%, TLC 131%, DLCO 99% Quantiferon-TB Gold 08/12/19- NEG PFT 08/22/23- Normal flows and DLCO. Overinflation and Airtrapping- or possibly large normal lungs. ---------------------------------------------------------------  09/06/22-  72 year old male former smoker followed for Asthma, Allergic Rhinitis, history rhinosinusitis, nasal polyps, complicated by Rheumatoid Arthritis// Orencia, Bipolar, GERD, Alzheimers, -Pro-air HFA, Trelegy 100, Singulair,. Flonase, Covid vax-3 Phizer Flu vax- had We sent augmentin 11/9,  ------Pt states sinus congestion and coughing is not clear after 2 rounds of antibiotics His PCP has sent nystatin for thrush after 2 rounds of Augmentin.  He says sinus infection symptoms are getting better "daily"-"80% better now".  Chest feels clear.  09/09/23- 72 year old male former smoker followed for Asthma, Allergic Rhinitis, history rhinosinusitis, nasal polyps, complicated by Rheumatoid Arthritis// Orencia, Bipolar, GERD, Alzheimers, -Pro-air HFA, Trelegy 100, Singulair,. Flonase, LOV Parrett, NP 08/21/23- Had acute asthma in Sept. Had associated all RA drugs with infection. He asked second opinion Rheumatology referral. ??? Still needed??    PFT 08/22/23- Normal flows and DLCO. Overinflation and Airtrapping- or possibly large normal lungs. HRCT chest 07/30/23-  IMPRESSION: 1. No evidence fibrotic interstitial lung disease or air trapping. 2. Nodular consolidation in the posterior segment right upper lobe, likely infectious/inflammatory in etiology. Malignancy cannot be excluded. Follow-up CT chest without contrast in 3-4 weeks, from today's study, is  recommended to ensure resolution. These results will be called to the ordering clinician or representative by the Radiologist Assistant, and communication documented in the PACS or [employer]. PET Scan 08/30/23- report pending    ROS-see HPI    + = positive Constitutional:   No-   weight loss, night sweats, fevers, chills, fatigue, lassitude. HEENT:   No-  headaches, difficulty swallowing, tooth/dental problems, sore throat,       No-  sneezing, itching, ear ache, nasal congestion,+ post nasal drip,  CV:  No-   chest pain, orthopnea, PND, swelling in lower extremities, anasarca, dizziness, palpitations Resp: No-   shortness of breath with exertion or at rest.                productive cough,   non-productive cough,   coughing up of blood.                 change in color of mucus.    wheezing.   Skin: No rash GI:  heartburn, indigestion, abdominal pain, nausea, vomiting, GU:  MS: +  joint pain or swelling.   Neuro-     nothing unusual Psych:  No- change in mood or affect. No depression or anxiety.  No memory loss.  OBJ General- Alert, Oriented, Affect-+ anxious, Distress- none acute, slender Skin-  Lymphadenopathy- none Head- atraumatic            Eyes- Gross vision intact, PERRLA, conjunctivae clear secretions            Ears- Hearing, canals-normal            Nose-  no-Septal dev, mucus, polyps-, erosion, perforation             Throat- Mallampati III , mucosa+mild thrush, drainage- none, tonsils- atrophic, +hoarse Neck- flexible , trachea midline, no stridor , thyroid nl, carotid no bruit Chest - symmetrical excursion , unlabored  Heart/CV- RRR , no murmur , no gallop  , no rub, nl s1 s2                           - JVD- none , edema- none, stasis changes- none, varices- none           Lung- + clear, wheeze- none, cough -none                               dullness-none, rub- none           Chest wall-  Abd-  Br/ Gen/ Rectal- Not done, not indicated Extrem-  cyanosis- none, clubbing, none, atrophy- none, strength- nl.                          +Synovial thickening of hand joints. Neuro- + tremor hands, some word-searching, ?flat facies

## 2023-09-09 ENCOUNTER — Encounter: Payer: Self-pay | Admitting: Internal Medicine

## 2023-09-09 ENCOUNTER — Ambulatory Visit: Payer: Medicare Other | Admitting: Internal Medicine

## 2023-09-09 VITALS — BP 143/87 | HR 67 | Temp 98.0°F | Ht 68.0 in | Wt 172.0 lb

## 2023-09-09 DIAGNOSIS — R911 Solitary pulmonary nodule: Secondary | ICD-10-CM | POA: Diagnosis not present

## 2023-09-09 DIAGNOSIS — J4540 Moderate persistent asthma, uncomplicated: Secondary | ICD-10-CM | POA: Diagnosis not present

## 2023-09-09 MED ORDER — ALBUTEROL SULFATE HFA 108 (90 BASE) MCG/ACT IN AERS: INHALATION_SPRAY | RESPIRATORY_TRACT | 12 refills | Status: DC

## 2023-09-09 NOTE — Patient Instructions (Signed)
We will let you know result of PET scan when Radiology releases it.  Script went for Ventolin brand of albuterol rescue inhaler  Please call if we can help

## 2023-09-17 ENCOUNTER — Telehealth: Payer: Self-pay | Admitting: Internal Medicine

## 2023-09-17 DIAGNOSIS — R948 Abnormal results of function studies of other organs and systems: Secondary | ICD-10-CM

## 2023-09-17 DIAGNOSIS — R9341 Abnormal radiologic findings on diagnostic imaging of renal pelvis, ureter, or bladder: Secondary | ICD-10-CM

## 2023-09-17 NOTE — Telephone Encounter (Signed)
    Clinic this is a office patient who had a PET scan.  The PET scan results came to me and said thank sharing with you    Reading Physician Reading Date Result Priority  Kennith Center, MD [phone] 09/09/2023    Narrative & Impression CLINICAL DATA:  Initial treatment strategy for pulmonary nodule, right upper lobe.   EXAM: NUCLEAR MEDICINE PET SKULL BASE TO THIGH   TECHNIQUE: 8.5 mCi F-18 FDG was injected intravenously. Full-ring PET imaging was performed from the skull base to thigh after the radiotracer. CT data was obtained and used for attenuation correction and anatomic localization.   Fasting blood glucose: 89 mg/dl   COMPARISON:  Chest CT 07/30/2023   FINDINGS: Mediastinal blood pool activity: SUV max 1.9   Liver activity: SUV max NA   NECK: No hypermetabolic lymph nodes in the neck.   Incidental CT findings: None.   CHEST: The pulmonary nodule identified previously in the posterior right upper lobe appears less confluent on CT imaging today with a more multi lobular appearance although overall measurements are similar at 9 x 9 mm today compared to 9 x 14 mm previously. There is no discernible hypermetabolism within this nodule on PET imaging although misregistered low level FDG accumulation is identified with SUV max = 1.3.   8 mm short axis precarinal lymph node on 69/4 shows low level hypermetabolism with SUV max = 3.2. Similar focal hypermetabolic activity is seen in a 5 mm AP window lymph node (67/4) with SUV max = 3.0. Focal FDG accumulation is identified in the hilar regions bilaterally.   Incidental CT findings: Ascending thoracic aorta measures up to 3.8 cm diameter. Coronary artery calcification is evident. Dependent atelectasis noted in both lower lungs.   ABDOMEN/PELVIS: No abnormal hypermetabolic activity within the liver, pancreas, adrenal glands, or spleen. No hypermetabolic lymph nodes in the abdomen or pelvis.   Incidental CT  findings: Bladder is distended with a right-sided bladder wall diverticulum evident.   SKELETON: Marked hypermetabolism is identified along the medial cortex of the right femoral neck with SUV max = 16.9 no bony or soft tissue lesion can be identified in this region on noncontrast CT imaging.   Incidental CT findings: None.   IMPRESSION: 1. The pulmonary nodule identified previously in the posterior right upper lobe appears less confluent on CT imaging today with a more multi lobular appearance although overall measurements are similar at 9 x 9 mm today compared to 9 x 14 mm previously. There is no discernible hypermetabolism within this nodule on PET imaging although misregistered low level FDG accumulation is identified. While this finding is likely infectious/inflammatory, low-grade or well differentiated neoplasm can be poorly FDG avid. Continued surveillance recommended. 2. Low level hypermetabolism associated with small precarinal and AP window lymph nodes. These are nonspecific and may be reactive. Attention on follow-up recommended. 3. Marked hypermetabolism along the medial cortex of the right femoral neck without bony or soft tissue lesion identified in this region on noncontrast CT imaging. Given the degree of FDG accumulation at this location, MRI of the right hip with and without contrast recommended to further evaluate. 4. Distended bladder with right-sided bladder wall diverticulum. Underlying component of bladder outlet obstruction suspected.     Electronically Signed   By: Kennith Center M.D.   On: 09/09/2023 10:17

## 2023-09-19 NOTE — Telephone Encounter (Signed)
I called Mr Howes about his PET scan results We will follow the lung nodules over time.  Order- refer to [facility]- possible bladder outlet obstruction on PET scan.  Order- schedule MRI with and without contrast- Right Femoral Neck- abnormal uptake on PET scan

## 2023-09-27 NOTE — Telephone Encounter (Signed)
Dr Maple Hudson- I placed the orders  Please make sure the MRI order is correct

## 2023-10-03 NOTE — Telephone Encounter (Signed)
Patient would like a call to get notified of when and where he should get his MRI done and where to go for [facility]. Call back number [phone]

## 2023-10-07 NOTE — Telephone Encounter (Signed)
Patient is calling for an update on his referrals.

## 2023-10-10 ENCOUNTER — Encounter: Payer: Self-pay | Admitting: Internal Medicine

## 2023-10-10 DIAGNOSIS — R911 Solitary pulmonary nodule: Secondary | ICD-10-CM | POA: Insufficient documentation

## 2023-10-10 NOTE — Progress Notes (Signed)
 HPI M former smoker followed for asthma, allergic rhinitis, hx rhinosinusitis, nasal polyps complicated by rheumatoid arthritis/ Orencia , Bipolar, GERD, Alzheimers,. FENO- 08/07/16- 25 Office spirometry 08/07/16- WNL, FEV1 3.59/ 110%, R 0.81 PFT 11/01/16- WNL-FVC 5.30/123%, FEV1 4.0/124%, ratio 0.75, FEF 25-75% 3.49/135%, TLC 131%, DLCO 99% Quantiferon-TB Gold 08/12/19- NEG PFT 08/22/23- Normal flows and DLCO. Overinflation and Airtrapping- or possibly large normal lungs. ---------------------------------------------------------------  09/06/22-  73 year old male former smoker followed for Asthma, Allergic Rhinitis, history rhinosinusitis, nasal polyps, complicated by Rheumatoid Arthritis// Orencia , Bipolar, GERD, Alzheimers, -Pro-air HFA, Trelegy 100, Singulair ,. Flonase , Covid vax-3 Phizer Flu vax- had We sent augmentin  11/9,  ------Pt states sinus congestion and coughing is not clear after 2 rounds of antibiotics His PCP has sent nystatin for thrush after 2 rounds of Augmentin .  He says sinus infection symptoms are getting better daily-80% better now.  Chest feels clear.  09/09/23- 73 year old male former smoker followed for Asthma, Allergic Rhinitis, history rhinosinusitis, nasal polyps, complicated by Rheumatoid Arthritis// Orencia , Bipolar, GERD, Alzheimers, -Pro-air HFA, Trelegy 100, Singulair ,. Flonase , LOV Parrett, NP 08/21/23- Had acute asthma in Sept. He has associated all RA drugs with infection. He asked second opinion Rheumatology referral. ??? Still needed?? Dr Geronimo had RX'd abx and steroid. Cough and hemoptysis stopped. Discussed HRCT. PET report will be called to him. PFT 08/22/23- Normal flows and DLCO. Overinflation and Airtrapping- or possibly large normal lungs. HRCT chest 07/30/23-  IMPRESSION: 1. No evidence fibrotic interstitial lung disease or air trapping. 2. Nodular consolidation in the posterior segment right upper lobe, likely infectious/inflammatory in  etiology. Malignancy cannot be excluded. Follow-up CT chest without contrast in 3-4 weeks, from today's study, is recommended to ensure resolution. These results will be called to the ordering clinician or representative by the Radiologist Assistant, and communication documented in the PACS or [employer]. PET Scan 08/30/23- report pending  10/11/23-  73 year old male former smoker followed for Abnormal CT chest, Abnormal PET,  Asthma, Allergic Rhinitis, history rhinosinusitis, nasal polyps, complicated by Rheumatoid Arthritis , Bipolar, GERD, Alzheimers, -Pro-air HFA, Trelegy 100, Singulair ,. Flonase , Finished Zpak/ prednisone  from Dr Geronimo, Pending MRI. Here to discuss PET. His chest now feels clear. We reviewed PET scan images and report. We will watch the RUL nodularity which may be inflammtory. He admits slow urine stream over the past year and PET suggested bladder outlet obstruction. We are referring to Urology. He has been off the potent immuno-modulators for his RA, blaming them for increased infections in past year. He had requested Rheumatology second opinion, and that is pending. He has not had any flare of arthritis symptoms, until last night when R hip hurt for first time. This is the area with hot spot on PET as discussed. He will go ahead with MRI scheduled for next week, then we will decide how to refer for management. Hopefully this will be his arthritis and not neoplasm or something else. PET 08/30/23-  IMPRESSION: 1. The pulmonary nodule identified previously in the posterior right upper lobe appears less confluent on CT imaging today with a more multi lobular appearance although overall measurements are similar at 9 x 9 mm today compared to 9 x 14 mm previously. There is no discernible hypermetabolism within this nodule on PET imaging although misregistered low level FDG accumulation is identified. While this finding is likely infectious/inflammatory, low-grade  or well differentiated neoplasm can be poorly FDG avid. Continued surveillance recommended. 2. Low level hypermetabolism associated with small precarinal and AP window lymph nodes. These are nonspecific and  may be reactive. Attention on follow-up recommended. 3. Marked hypermetabolism along the medial cortex of the right      >>>>MRI R hip pending 10/14/23 femoral neck without bony or soft tissue lesion identified in this region on noncontrast CT imaging. Given the degree of FDG accumulation at this location, MRI of the right hip with and without contrast recommended to further evaluate. 4. Distended bladder with right-sided bladder wall diverticulum. Underlying component of bladder outlet obstruction suspected.  ROS-see HPI    + = positive Constitutional:   No-   weight loss, night sweats, fevers, chills, fatigue, lassitude. HEENT:   No-  headaches, difficulty swallowing, tooth/dental problems, sore throat,       No-  sneezing, itching, ear ache, nasal congestion,+ post nasal drip,  CV:  No-   chest pain, orthopnea, PND, swelling in lower extremities, anasarca, dizziness, palpitations Resp: No-   shortness of breath with exertion or at rest.                productive cough,   non-productive cough,   coughing up of blood.                 change in color of mucus.    wheezing.   Skin: No rash GI:  heartburn, indigestion, abdominal pain, nausea, vomiting, GU:  MS: +  joint pain or swelling.   Neuro-     nothing unusual Psych:  No- change in mood or affect. No depression or anxiety.  No memory loss.  OBJ General- Alert, Oriented, Affect-+ anxious, Distress- none acute, slender Skin-  Lymphadenopathy- none Head- atraumatic            Eyes- Gross vision intact, PERRLA, conjunctivae clear secretions            Ears- Hearing, canals-normal            Nose-  no-Septal dev, mucus, polyps-, erosion, perforation             Throat- Mallampati III , mucosa clear, drainage- none, tonsils-  atrophic,  Neck- flexible , trachea midline, no stridor , thyroid  nl, carotid no bruit Chest - symmetrical excursion , unlabored           Heart/CV- RRR , no murmur , no gallop  , no rub, nl s1 s2                           - JVD- none , edema- none, stasis changes- none, varices- none           Lung- + clear, wheeze- none, cough -none                               dullness-none, rub- none           Chest wall-  Abd-  Br/ Gen/ Rectal- Not done, not indicated Extrem- cyanosis- none, clubbing, none, atrophy- none, strength- nl.                          +Synovial thickening of hand joints. Neuro- + tremor hands, some word-searching, ?flat facies

## 2023-10-10 NOTE — Assessment & Plan Note (Signed)
 Recent exacerbation responded to abx/ steroid  Plan- continue routine meds

## 2023-10-10 NOTE — Assessment & Plan Note (Signed)
 Hopefully inflammatory, given his arthritis hx. Plan- F/U imaging as recommended by Radiology. Need report of PET.

## 2023-10-11 ENCOUNTER — Encounter: Payer: Self-pay | Admitting: Internal Medicine

## 2023-10-11 ENCOUNTER — Ambulatory Visit: Payer: Medicare Other | Admitting: Internal Medicine

## 2023-10-11 VITALS — BP 128/78 | HR 66 | Temp 98.0°F | Ht 68.0 in | Wt 175.8 lb

## 2023-10-11 DIAGNOSIS — N320 Bladder-neck obstruction: Secondary | ICD-10-CM

## 2023-10-11 DIAGNOSIS — R948 Abnormal results of function studies of other organs and systems: Secondary | ICD-10-CM | POA: Insufficient documentation

## 2023-10-11 DIAGNOSIS — J4540 Moderate persistent asthma, uncomplicated: Secondary | ICD-10-CM

## 2023-10-11 DIAGNOSIS — O35EXX0 Maternal care for other (suspected) fetal abnormality and damage, fetal genitourinary anomalies, not applicable or unspecified: Secondary | ICD-10-CM | POA: Insufficient documentation

## 2023-10-11 DIAGNOSIS — R911 Solitary pulmonary nodule: Secondary | ICD-10-CM

## 2023-10-11 NOTE — Assessment & Plan Note (Signed)
 Referral to Urology

## 2023-10-11 NOTE — Assessment & Plan Note (Signed)
 Now at baseline. Recent exacerbtion responded to Zpak/ prednisone Continue routine meds

## 2023-10-11 NOTE — Assessment & Plan Note (Signed)
 No corresponding abnormality on CT Hopefully related to his RA Plan- MRI pending

## 2023-10-11 NOTE — Assessment & Plan Note (Signed)
 Loosely nodular on PET, hopefully inflammatory Plan- follow. Keep appt for April

## 2023-10-11 NOTE — Patient Instructions (Addendum)
 Keep appointment for MRI on Monday  Order- referral to [facility] dx bladder outlet obstruction  Keep April 3 appointment to follow up with me for routine.

## 2023-10-14 ENCOUNTER — Ambulatory Visit (HOSPITAL_COMMUNITY)
Admission: RE | Admit: 2023-10-14 | Discharge: 2023-10-14 | Disposition: A | Discharge status: Home / Self Care | Payer: Medicare Other | Source: Ambulatory Visit | Attending: Internal Medicine | Admitting: Internal Medicine

## 2023-10-14 DIAGNOSIS — M25451 Effusion, right hip: Secondary | ICD-10-CM | POA: Diagnosis not present

## 2023-10-14 DIAGNOSIS — M65951 Unspecified synovitis and tenosynovitis, right thigh: Secondary | ICD-10-CM | POA: Diagnosis not present

## 2023-10-14 DIAGNOSIS — S73191A Other sprain of right hip, initial encounter: Secondary | ICD-10-CM | POA: Diagnosis not present

## 2023-10-14 DIAGNOSIS — R9341 Abnormal radiologic findings on diagnostic imaging of renal pelvis, ureter, or bladder: Secondary | ICD-10-CM | POA: Diagnosis not present

## 2023-10-14 MED ORDER — GADOBUTROL 1 MMOL/ML IV SOLN
7.0000 mL | Freq: Once | INTRAVENOUS | Status: AC | PRN
  Administered 2023-10-14: 7 mL via INTRAVENOUS

## 2023-10-21 DIAGNOSIS — R3912 Poor urinary stream: Secondary | ICD-10-CM | POA: Diagnosis not present

## 2023-10-23 ENCOUNTER — Ambulatory Visit: Payer: Medicare Other | Admitting: Rheumatology

## 2023-10-29 ENCOUNTER — Encounter: Payer: Self-pay | Admitting: *Deleted

## 2023-11-08 ENCOUNTER — Other Ambulatory Visit: Payer: Self-pay | Admitting: Internal Medicine

## 2023-11-14 DIAGNOSIS — M0609 Rheumatoid arthritis without rheumatoid factor, multiple sites: Secondary | ICD-10-CM | POA: Diagnosis not present

## 2023-11-14 DIAGNOSIS — Z7960 Long term (current) use of unspecified immunomodulators and immunosuppressants: Secondary | ICD-10-CM | POA: Diagnosis not present

## 2023-12-08 ENCOUNTER — Other Ambulatory Visit: Payer: Self-pay | Admitting: Internal Medicine

## 2023-12-09 DIAGNOSIS — H532 Diplopia: Secondary | ICD-10-CM | POA: Diagnosis not present

## 2023-12-09 DIAGNOSIS — H43811 Vitreous degeneration, right eye: Secondary | ICD-10-CM | POA: Diagnosis not present

## 2023-12-09 DIAGNOSIS — H5000 Unspecified esotropia: Secondary | ICD-10-CM | POA: Diagnosis not present

## 2023-12-09 DIAGNOSIS — H2513 Age-related nuclear cataract, bilateral: Secondary | ICD-10-CM | POA: Diagnosis not present

## 2023-12-09 DIAGNOSIS — Z79899 Other long term (current) drug therapy: Secondary | ICD-10-CM | POA: Diagnosis not present

## 2023-12-09 DIAGNOSIS — M069 Rheumatoid arthritis, unspecified: Secondary | ICD-10-CM | POA: Diagnosis not present

## 2023-12-13 DIAGNOSIS — L820 Inflamed seborrheic keratosis: Secondary | ICD-10-CM | POA: Diagnosis not present

## 2023-12-13 DIAGNOSIS — L570 Actinic keratosis: Secondary | ICD-10-CM | POA: Diagnosis not present

## 2023-12-13 DIAGNOSIS — L739 Follicular disorder, unspecified: Secondary | ICD-10-CM | POA: Diagnosis not present

## 2023-12-13 DIAGNOSIS — L538 Other specified erythematous conditions: Secondary | ICD-10-CM | POA: Diagnosis not present

## 2023-12-13 DIAGNOSIS — D225 Melanocytic nevi of trunk: Secondary | ICD-10-CM | POA: Diagnosis not present

## 2023-12-13 DIAGNOSIS — L821 Other seborrheic keratosis: Secondary | ICD-10-CM | POA: Diagnosis not present

## 2023-12-13 DIAGNOSIS — L814 Other melanin hyperpigmentation: Secondary | ICD-10-CM | POA: Diagnosis not present

## 2023-12-19 DIAGNOSIS — D84821 Immunodeficiency due to drugs: Secondary | ICD-10-CM | POA: Diagnosis not present

## 2023-12-19 DIAGNOSIS — J4520 Mild intermittent asthma, uncomplicated: Secondary | ICD-10-CM | POA: Diagnosis not present

## 2023-12-19 DIAGNOSIS — R911 Solitary pulmonary nodule: Secondary | ICD-10-CM | POA: Diagnosis not present

## 2023-12-19 DIAGNOSIS — M069 Rheumatoid arthritis, unspecified: Secondary | ICD-10-CM | POA: Diagnosis not present

## 2023-12-19 DIAGNOSIS — I10 Essential (primary) hypertension: Secondary | ICD-10-CM | POA: Diagnosis not present

## 2024-01-08 NOTE — Progress Notes (Unsigned)
 HPI M former smoker followed for asthma, allergic rhinitis, hx rhinosinusitis, nasal polyps complicated by rheumatoid arthritis/ Orencia, Bipolar, GERD, Alzheimers,. FENO- 08/07/16- 25 Office spirometry 08/07/16- WNL, FEV1 3.59/ 110%, R 0.81 PFT 11/01/16- WNL-FVC 5.30/123%, FEV1 4.0/124%, ratio 0.75, FEF 25-75% 3.49/135%, TLC 131%, DLCO 99% Quantiferon-TB Gold 08/12/19- NEG PFT 08/22/23- Normal flows and DLCO. Overinflation and Airtrapping- or possibly large normal lungs. ---------------------------------------------------------------  09/06/22-  73 year old male former smoker followed for Asthma, Allergic Rhinitis, history rhinosinusitis, nasal polyps, complicated by Rheumatoid Arthritis// Orencia, Bipolar, GERD, Alzheimers, -Pro-air HFA, Trelegy 100, Singulair,. Flonase, Covid vax-3 Phizer Flu vax- had We sent augmentin 11/9,  ------Pt states sinus congestion and coughing is not clear after 2 rounds of antibiotics His PCP has sent nystatin for thrush after 2 rounds of Augmentin.  He says sinus infection symptoms are getting better "daily"-"80% better now".  Chest feels clear.  09/09/23- 73 year old male former smoker followed for Asthma, Allergic Rhinitis, history rhinosinusitis, nasal polyps, complicated by Rheumatoid Arthritis// Orencia, Bipolar, GERD, Alzheimers, -Pro-air HFA, Trelegy 100, Singulair,. Flonase, LOV Parrett, NP 08/21/23- Had acute asthma in Sept. He has associated all RA drugs with infection. He asked second opinion Rheumatology referral. ??? Still needed?? Dr Marchelle Gearing had RX'd abx and steroid. Cough and hemoptysis stopped. Discussed HRCT. PET report will be called to him. PFT 08/22/23- Normal flows and DLCO. Overinflation and Airtrapping- or possibly large normal lungs. HRCT chest 07/30/23-  IMPRESSION: 1. No evidence fibrotic interstitial lung disease or air trapping. 2. Nodular consolidation in the posterior segment right upper lobe, likely infectious/inflammatory in  etiology. Malignancy cannot be excluded. Follow-up CT chest without contrast in 3-4 weeks, from today's study, is recommended to ensure resolution. These results will be called to the ordering clinician or representative by the Radiologist Assistant, and communication documented in the PACS or [employer]. PET Scan 08/30/23- report pending  10/11/23-  73 year old male former smoker followed for Abnormal CT chest, Abnormal PET,  Asthma, Allergic Rhinitis, history rhinosinusitis, nasal polyps, complicated by Rheumatoid Arthritis , Bipolar, GERD, Alzheimers, -Pro-air HFA, Trelegy 100, Singulair,. Flonase, Finished Zpak/ prednisone from Dr Marchelle Gearing, Pending MRI. Here to discuss PET. His chest now feels clear. We reviewed PET scan images and report. We will watch the RUL nodularity which may be inflammtory. He admits slow urine stream over the past year and PET suggested bladder outlet obstruction. We are referring to Urology. He has been off the potent immuno-modulators for his RA, blaming them for increased infections in past year. He had requested Rheumatology second opinion, and that is pending. He has not had any flare of arthritis symptoms, until last night when R hip hurt for first time. This is the area with hot spot on PET as discussed. He will go ahead with MRI scheduled for next week, then we will decide how to refer for management. Hopefully this will be his arthritis and not neoplasm or something else. PET 08/30/23-  IMPRESSION: 1. The pulmonary nodule identified previously in the posterior right upper lobe appears less confluent on CT imaging today with a more multi lobular appearance although overall measurements are similar at 9 x 9 mm today compared to 9 x 14 mm previously. There is no discernible hypermetabolism within this nodule on PET imaging although misregistered low level FDG accumulation is identified. While this finding is likely infectious/inflammatory, low-grade  or well differentiated neoplasm can be poorly FDG avid. Continued surveillance recommended. 2. Low level hypermetabolism associated with small precarinal and AP window lymph nodes. These are nonspecific and  may be reactive. Attention on follow-up recommended. 3. Marked hypermetabolism along the medial cortex of the right      >>>>MRI R hip pending 10/14/23 femoral neck without bony or soft tissue lesion identified in this region on noncontrast CT imaging. Given the degree of FDG accumulation at this location, MRI of the right hip with and without contrast recommended to further evaluate. 4. Distended bladder with right-sided bladder wall diverticulum. Underlying component of bladder outlet obstruction suspected.  01/09/24- 73 year old male former smoker followed for Abnormal CT chest, Abnormal PET,  Asthma, Allergic Rhinitis, history rhinosinusitis, nasal polyps, complicated by Rheumatoid Arthritis , Bipolar, GERD, Alzheimers, -Pro-air HFA, Trelegy 100, Singulair,. Flonase, Following RUL nodule with small mediastinal nodes- had HRCT in Novemeber, followed by PET.    ROS-see HPI    + = positive Constitutional:   No-   weight loss, night sweats, fevers, chills, fatigue, lassitude. HEENT:   No-  headaches, difficulty swallowing, tooth/dental problems, sore throat,       No-  sneezing, itching, ear ache, nasal congestion,+ post nasal drip,  CV:  No-   chest pain, orthopnea, PND, swelling in lower extremities, anasarca, dizziness, palpitations Resp: No-   shortness of breath with exertion or at rest.                productive cough,   non-productive cough,   coughing up of blood.                 change in color of mucus.    wheezing.   Skin: No rash GI:  heartburn, indigestion, abdominal pain, nausea, vomiting, GU:  MS: +  joint pain or swelling.   Neuro-     nothing unusual Psych:  No- change in mood or affect. No depression or anxiety.  No memory loss.  OBJ General- Alert, Oriented,  Affect-+ anxious, Distress- none acute, slender Skin-  Lymphadenopathy- none Head- atraumatic            Eyes- Gross vision intact, PERRLA, conjunctivae clear secretions            Ears- Hearing, canals-normal            Nose-  no-Septal dev, mucus, polyps-, erosion, perforation             Throat- Mallampati III , mucosa clear, drainage- none, tonsils- atrophic,  Neck- flexible , trachea midline, no stridor , thyroid nl, carotid no bruit Chest - symmetrical excursion , unlabored           Heart/CV- RRR , no murmur , no gallop  , no rub, nl s1 s2                           - JVD- none , edema- none, stasis changes- none, varices- none           Lung- + clear, wheeze- none, cough -none                               dullness-none, rub- none           Chest wall-  Abd-  Br/ Gen/ Rectal- Not done, not indicated Extrem- cyanosis- none, clubbing, none, atrophy- none, strength- nl.                          +Synovial thickening of hand joints. Neuro- + tremor hands,  some word-searching, ?flat facies

## 2024-01-09 ENCOUNTER — Ambulatory Visit: Payer: Medicare Other | Admitting: Internal Medicine

## 2024-01-09 ENCOUNTER — Encounter: Payer: Self-pay | Admitting: Internal Medicine

## 2024-01-09 VITALS — BP 110/66 | HR 74 | Temp 97.9°F | Ht 68.0 in | Wt 172.8 lb

## 2024-01-09 DIAGNOSIS — R918 Other nonspecific abnormal finding of lung field: Secondary | ICD-10-CM

## 2024-01-09 DIAGNOSIS — R911 Solitary pulmonary nodule: Secondary | ICD-10-CM | POA: Diagnosis not present

## 2024-01-09 NOTE — Patient Instructions (Signed)
 Order CT chest no contrast (in June)   dx RUL nodule  9mm  We can continue current meds for allergies and asthma  Please call if we can help

## 2024-01-10 DIAGNOSIS — R899 Unspecified abnormal finding in specimens from other organs, systems and tissues: Secondary | ICD-10-CM | POA: Diagnosis not present

## 2024-02-17 DIAGNOSIS — M0609 Rheumatoid arthritis without rheumatoid factor, multiple sites: Secondary | ICD-10-CM | POA: Diagnosis not present

## 2024-02-17 DIAGNOSIS — R899 Unspecified abnormal finding in specimens from other organs, systems and tissues: Secondary | ICD-10-CM | POA: Diagnosis not present

## 2024-02-18 DIAGNOSIS — H04333 Acute lacrimal canaliculitis of bilateral lacrimal passages: Secondary | ICD-10-CM | POA: Diagnosis not present

## 2024-02-18 DIAGNOSIS — H01132 Eczematous dermatitis of right lower eyelid: Secondary | ICD-10-CM | POA: Diagnosis not present

## 2024-02-18 DIAGNOSIS — H01135 Eczematous dermatitis of left lower eyelid: Secondary | ICD-10-CM | POA: Diagnosis not present

## 2024-03-03 DIAGNOSIS — H01135 Eczematous dermatitis of left lower eyelid: Secondary | ICD-10-CM | POA: Diagnosis not present

## 2024-03-03 DIAGNOSIS — H01132 Eczematous dermatitis of right lower eyelid: Secondary | ICD-10-CM | POA: Diagnosis not present

## 2024-03-03 DIAGNOSIS — H04333 Acute lacrimal canaliculitis of bilateral lacrimal passages: Secondary | ICD-10-CM | POA: Diagnosis not present

## 2024-03-16 ENCOUNTER — Ambulatory Visit
Admission: RE | Admit: 2024-03-16 | Discharge: 2024-03-16 | Disposition: A | Discharge status: Home / Self Care | Source: Ambulatory Visit | Attending: Internal Medicine | Admitting: Internal Medicine

## 2024-03-16 DIAGNOSIS — J479 Bronchiectasis, uncomplicated: Secondary | ICD-10-CM | POA: Diagnosis not present

## 2024-03-16 DIAGNOSIS — R911 Solitary pulmonary nodule: Secondary | ICD-10-CM

## 2024-03-16 DIAGNOSIS — R918 Other nonspecific abnormal finding of lung field: Secondary | ICD-10-CM

## 2024-03-23 ENCOUNTER — Ambulatory Visit: Payer: Self-pay | Admitting: Internal Medicine

## 2024-04-21 DIAGNOSIS — R3914 Feeling of incomplete bladder emptying: Secondary | ICD-10-CM | POA: Diagnosis not present

## 2024-04-21 DIAGNOSIS — R3912 Poor urinary stream: Secondary | ICD-10-CM | POA: Diagnosis not present

## 2024-05-06 DIAGNOSIS — R3914 Feeling of incomplete bladder emptying: Secondary | ICD-10-CM | POA: Diagnosis not present

## 2024-05-18 DIAGNOSIS — R3912 Poor urinary stream: Secondary | ICD-10-CM | POA: Diagnosis not present

## 2024-05-18 DIAGNOSIS — R3914 Feeling of incomplete bladder emptying: Secondary | ICD-10-CM | POA: Diagnosis not present

## 2024-06-01 DIAGNOSIS — K219 Gastro-esophageal reflux disease without esophagitis: Secondary | ICD-10-CM | POA: Diagnosis not present

## 2024-06-01 DIAGNOSIS — J45909 Unspecified asthma, uncomplicated: Secondary | ICD-10-CM | POA: Diagnosis not present

## 2024-06-01 DIAGNOSIS — Z86018 Personal history of other benign neoplasm: Secondary | ICD-10-CM | POA: Diagnosis not present

## 2024-06-01 DIAGNOSIS — K921 Melena: Secondary | ICD-10-CM | POA: Diagnosis not present

## 2024-06-10 DIAGNOSIS — Z0000 Encounter for general adult medical examination without abnormal findings: Secondary | ICD-10-CM | POA: Diagnosis not present

## 2024-06-10 DIAGNOSIS — Z23 Encounter for immunization: Secondary | ICD-10-CM | POA: Diagnosis not present

## 2024-06-10 DIAGNOSIS — E785 Hyperlipidemia, unspecified: Secondary | ICD-10-CM | POA: Diagnosis not present

## 2024-06-10 DIAGNOSIS — J4520 Mild intermittent asthma, uncomplicated: Secondary | ICD-10-CM | POA: Diagnosis not present

## 2024-06-10 DIAGNOSIS — M069 Rheumatoid arthritis, unspecified: Secondary | ICD-10-CM | POA: Diagnosis not present

## 2024-06-10 DIAGNOSIS — I10 Essential (primary) hypertension: Secondary | ICD-10-CM | POA: Diagnosis not present

## 2024-06-11 DIAGNOSIS — Z09 Encounter for follow-up examination after completed treatment for conditions other than malignant neoplasm: Secondary | ICD-10-CM | POA: Diagnosis not present

## 2024-06-11 DIAGNOSIS — K5730 Diverticulosis of large intestine without perforation or abscess without bleeding: Secondary | ICD-10-CM | POA: Diagnosis not present

## 2024-06-11 DIAGNOSIS — Z860100 Personal history of colon polyps, unspecified: Secondary | ICD-10-CM | POA: Diagnosis not present

## 2024-07-04 ENCOUNTER — Other Ambulatory Visit: Payer: Self-pay | Admitting: Internal Medicine

## 2024-07-06 NOTE — Progress Notes (Unsigned)
 HPI M former smoker followed for asthma, allergic rhinitis, hx rhinosinusitis, nasal polyps complicated by rheumatoid arthritis/ Orencia , Bipolar, GERD, Alzheimers,. FENO- 08/07/16- 25 Office spirometry 08/07/16- WNL, FEV1 3.59/ 110%, R 0.81 PFT 11/01/16- WNL-FVC 5.30/123%, FEV1 4.0/124%, ratio 0.75, FEF 25-75% 3.49/135%, TLC 131%, DLCO 99% Quantiferon-TB Gold 08/12/19- NEG PFT 08/22/23- Normal flows and DLCO. Overinflation and Airtrapping- or possibly large normal lungs. ---------------------------------------------------------------    01/09/24- 73 year old male former smoker followed for Abnormal CT chest, Abnormal PET,  Asthma, Allergic Rhinitis, history rhinosinusitis, nasal polyps, complicated by Rheumatoid Arthritis , Bipolar, GERD, Alzheimers, -Pro-air HFA, Trelegy 100, Singulair ,. Flonase , Following RUL nodule with small mediastinal nodes- had HRCT in November, followed by PET. ACT score 21 Discussed the use of AI scribe software for clinical note transcription with the patient, who gave verbal consent to proceed.  History of Present Illness   The patient, with a history of asthma, allergic rhinitis, and lung nodules, presents with general malaise. He denies any specific discomfort or unusual symptoms, attributing his lack of energy to recent housework. His breathing has been good with the spring weather, although he notes some irritation from pollen. He manages his symptoms with Flonase , albuterol  inhaler, Trelegy inhaler, and over-the-counter Zyrtec.  He was previously evaluated for a distended bladder, which was identified on a PET scan. He is currently managed by a urologist and is on a second medication, which is well-tolerated.  The patient also has lung nodules, which did not show any uptake on a recent PET scan. The patient's last imaging was in November, and the next CT scan is planned for June.   Assessment and Plan:    Asthma moderate persistent uncomplicated Asthma  well-controlled with current medications. No recent albuterol  use since resolved hemoptysis. Albuterol  deemed safe if needed. - Continue Trelegy inhaler, albuterol  inhaler, and Flonase . - Use albuterol  inhaler as needed for exacerbations. - Continue Zyrtec for allergies.  Lung nodules Lung nodules stable with no significant PET scan uptake. Monitoring for changes over time. - Order follow-up CT scan in June. - Scheduling  CT for 9mm RUL nodule.  Bladder distension Previous bladder distension resolved with urology consultation and medication adjustment. - Continue current urology management and medication.      07/07/24- 72 year old male former smoker followed for Abnormal CT chest, Abnormal PET,  Asthma, Allergic Rhinitis, history rhinosinusitis, nasal polyps, complicated by Rheumatoid Arthritis , Bipolar, GERD, Alzheimers, -Pro-air HFA, Trelegy 100, Singulair ,. Flonase , Following RUL nodule with small mediastinal nodes- had HRCT in November, followed by PET.  CT chest 03/16/24 IMPRESSION: 1. Resolution of the nodular consolidation in the posterolateral base of the right upper lobe except for a small amount of reticulomicronodular scar-like opacity in the area. 2. Stable borderline sized mediastinal nodes. No new or progressive adenopathy. 3. Aortic and coronary artery atherosclerosis. 4. Slightly prominent pulmonary trunk but no more than previously. 5. Slight cylindrical bronchiectasis centrally.     ROS-see HPI    + = positive Constitutional:   No-   weight loss, night sweats, fevers, chills, fatigue, lassitude. HEENT:   No-  headaches, difficulty swallowing, tooth/dental problems, sore throat,       No-  sneezing, itching, ear ache, nasal congestion,+ post nasal drip,  CV:  No-   chest pain, orthopnea, PND, swelling in lower extremities, anasarca, dizziness, palpitations Resp: No-   shortness of breath with exertion or at rest.                productive cough,   non-productive  cough,   coughing up of blood.                 change in color of mucus.    wheezing.   Skin: No rash GI:  heartburn, indigestion, abdominal pain, nausea, vomiting, GU:  MS: +  joint pain or swelling.   Neuro-     nothing unusual Psych:  No- change in mood or affect. No depression or anxiety.  No memory loss.  OBJ General- Alert, Oriented, Affect-normal, Distress- none acute, slender Skin-  Lymphadenopathy- none Head- atraumatic            Eyes- Gross vision intact, PERRLA, conjunctivae clear secretions            Ears- Hearing, canals-normal            Nose-  no-Septal dev, mucus, polyps-, erosion, perforation             Throat- Mallampati III , mucosa clear, drainage- none, tonsils- atrophic,  Neck- flexible , trachea midline, no stridor , thyroid  nl, carotid no bruit Chest - symmetrical excursion , unlabored           Heart/CV- RRR , no murmur , no gallop  , no rub, nl s1 s2                           - JVD- none , edema- none, stasis changes- none, varices- none           Lung- + clear, wheeze- none, cough -none                               dullness-none, rub- none           Chest wall-  Abd-  Br/ Gen/ Rectal- Not done, not indicated Extrem- cyanosis- none, clubbing, none, atrophy- none, strength- nl.                          +Synovial thickening of hand joints. Neuro- + tremor hands, some word-searching, flat facies

## 2024-07-07 ENCOUNTER — Ambulatory Visit: Admitting: Internal Medicine

## 2024-07-07 ENCOUNTER — Encounter: Payer: Self-pay | Admitting: Internal Medicine

## 2024-07-07 VITALS — BP 138/80 | HR 60 | Temp 98.8°F | Ht 68.0 in | Wt 178.2 lb

## 2024-07-07 DIAGNOSIS — J4540 Moderate persistent asthma, uncomplicated: Secondary | ICD-10-CM | POA: Diagnosis not present

## 2024-07-07 NOTE — Patient Instructions (Signed)
Glad you are doing well

## 2024-07-09 DIAGNOSIS — M6281 Muscle weakness (generalized): Secondary | ICD-10-CM | POA: Diagnosis not present

## 2024-07-09 DIAGNOSIS — R3914 Feeling of incomplete bladder emptying: Secondary | ICD-10-CM | POA: Diagnosis not present

## 2024-07-09 DIAGNOSIS — M6289 Other specified disorders of muscle: Secondary | ICD-10-CM | POA: Diagnosis not present

## 2024-07-16 DIAGNOSIS — G4700 Insomnia, unspecified: Secondary | ICD-10-CM | POA: Diagnosis not present

## 2024-07-16 DIAGNOSIS — Z23 Encounter for immunization: Secondary | ICD-10-CM | POA: Diagnosis not present

## 2024-07-16 DIAGNOSIS — I10 Essential (primary) hypertension: Secondary | ICD-10-CM | POA: Diagnosis not present

## 2024-07-24 DIAGNOSIS — R609 Edema, unspecified: Secondary | ICD-10-CM | POA: Diagnosis not present

## 2024-10-23 ENCOUNTER — Other Ambulatory Visit: Payer: Self-pay | Admitting: Internal Medicine

## 2025-01-04 ENCOUNTER — Encounter
